# Patient Record
Sex: Male | Born: 1965
Health system: Southern US, Community
[De-identification: ages and names within clinical notes are randomized; demographics above are authoritative.]

## PROBLEM LIST (undated history)

## (undated) DIAGNOSIS — Z720 Tobacco use: Secondary | ICD-10-CM

## (undated) DIAGNOSIS — I639 Cerebral infarction, unspecified: Secondary | ICD-10-CM

## (undated) HISTORY — DX: Cerebral infarction, unspecified: I63.9

---

## 2019-06-02 ENCOUNTER — Inpatient Hospital Stay (HOSPITAL_COMMUNITY)
Admission: EM | Admit: 2019-06-02 | Discharge: 2019-06-12 | DRG: 064 | Disposition: A | Discharge status: Rehabilitation Facility | Payer: Medicaid Other | Attending: Family Medicine | Admitting: Family Medicine

## 2019-06-02 ENCOUNTER — Observation Stay (HOSPITAL_COMMUNITY): Payer: Medicaid Other

## 2019-06-02 ENCOUNTER — Other Ambulatory Visit: Payer: Self-pay

## 2019-06-02 ENCOUNTER — Emergency Department (HOSPITAL_COMMUNITY): Payer: Medicaid Other

## 2019-06-02 ENCOUNTER — Encounter (HOSPITAL_COMMUNITY): Payer: Self-pay

## 2019-06-02 ENCOUNTER — Inpatient Hospital Stay (HOSPITAL_COMMUNITY): Payer: Medicaid Other

## 2019-06-02 DIAGNOSIS — G936 Cerebral edema: Secondary | ICD-10-CM | POA: Diagnosis present

## 2019-06-02 DIAGNOSIS — Z885 Allergy status to narcotic agent status: Secondary | ICD-10-CM

## 2019-06-02 DIAGNOSIS — I651 Occlusion and stenosis of basilar artery: Secondary | ICD-10-CM | POA: Diagnosis present

## 2019-06-02 DIAGNOSIS — Z7952 Long term (current) use of systemic steroids: Secondary | ICD-10-CM | POA: Diagnosis not present

## 2019-06-02 DIAGNOSIS — R7303 Prediabetes: Secondary | ICD-10-CM

## 2019-06-02 DIAGNOSIS — D72829 Elevated white blood cell count, unspecified: Secondary | ICD-10-CM

## 2019-06-02 DIAGNOSIS — R29714 NIHSS score 14: Secondary | ICD-10-CM | POA: Diagnosis present

## 2019-06-02 DIAGNOSIS — I6322 Cerebral infarction due to unspecified occlusion or stenosis of basilar arteries: Principal | ICD-10-CM | POA: Diagnosis present

## 2019-06-02 DIAGNOSIS — Z886 Allergy status to analgesic agent status: Secondary | ICD-10-CM

## 2019-06-02 DIAGNOSIS — I951 Orthostatic hypotension: Secondary | ICD-10-CM | POA: Diagnosis present

## 2019-06-02 DIAGNOSIS — G8929 Other chronic pain: Secondary | ICD-10-CM | POA: Diagnosis present

## 2019-06-02 DIAGNOSIS — Z20822 Contact with and (suspected) exposure to covid-19: Secondary | ICD-10-CM | POA: Diagnosis present

## 2019-06-02 DIAGNOSIS — F17210 Nicotine dependence, cigarettes, uncomplicated: Secondary | ICD-10-CM | POA: Diagnosis present

## 2019-06-02 DIAGNOSIS — G629 Polyneuropathy, unspecified: Secondary | ICD-10-CM | POA: Diagnosis present

## 2019-06-02 DIAGNOSIS — I82290 Acute embolism and thrombosis of other thoracic veins: Secondary | ICD-10-CM | POA: Diagnosis present

## 2019-06-02 DIAGNOSIS — Z880 Allergy status to penicillin: Secondary | ICD-10-CM

## 2019-06-02 DIAGNOSIS — I69365 Other paralytic syndrome following cerebral infarction, bilateral: Secondary | ICD-10-CM | POA: Diagnosis not present

## 2019-06-02 DIAGNOSIS — R1110 Vomiting, unspecified: Secondary | ICD-10-CM

## 2019-06-02 DIAGNOSIS — R471 Dysarthria and anarthria: Secondary | ICD-10-CM | POA: Diagnosis present

## 2019-06-02 DIAGNOSIS — Z79899 Other long term (current) drug therapy: Secondary | ICD-10-CM | POA: Diagnosis not present

## 2019-06-02 DIAGNOSIS — M549 Dorsalgia, unspecified: Secondary | ICD-10-CM | POA: Diagnosis present

## 2019-06-02 DIAGNOSIS — I69354 Hemiplegia and hemiparesis following cerebral infarction affecting left non-dominant side: Secondary | ICD-10-CM | POA: Diagnosis not present

## 2019-06-02 DIAGNOSIS — R4701 Aphasia: Secondary | ICD-10-CM | POA: Diagnosis present

## 2019-06-02 DIAGNOSIS — I639 Cerebral infarction, unspecified: Secondary | ICD-10-CM | POA: Diagnosis present

## 2019-06-02 DIAGNOSIS — F419 Anxiety disorder, unspecified: Secondary | ICD-10-CM | POA: Diagnosis not present

## 2019-06-02 DIAGNOSIS — I6340 Cerebral infarction due to embolism of unspecified cerebral artery: Secondary | ICD-10-CM | POA: Diagnosis present

## 2019-06-02 DIAGNOSIS — G4733 Obstructive sleep apnea (adult) (pediatric): Secondary | ICD-10-CM | POA: Diagnosis present

## 2019-06-02 DIAGNOSIS — E876 Hypokalemia: Secondary | ICD-10-CM

## 2019-06-02 DIAGNOSIS — T17908A Unspecified foreign body in respiratory tract, part unspecified causing other injury, initial encounter: Secondary | ICD-10-CM

## 2019-06-02 DIAGNOSIS — Z720 Tobacco use: Secondary | ICD-10-CM

## 2019-06-02 DIAGNOSIS — E785 Hyperlipidemia, unspecified: Secondary | ICD-10-CM | POA: Diagnosis present

## 2019-06-02 DIAGNOSIS — Z8249 Family history of ischemic heart disease and other diseases of the circulatory system: Secondary | ICD-10-CM

## 2019-06-02 DIAGNOSIS — R29810 Facial weakness: Secondary | ICD-10-CM | POA: Diagnosis present

## 2019-06-02 HISTORY — DX: Tobacco use: Z72.0

## 2019-06-02 LAB — COMPREHENSIVE METABOLIC PANEL
ALT: 46 U/L — ABNORMAL HIGH (ref 0–44)
AST: 25 U/L (ref 15–41)
Albumin: 3.7 g/dL (ref 3.5–5.0)
Alkaline Phosphatase: 92 U/L (ref 38–126)
Anion gap: 14 (ref 5–15)
BUN: 24 mg/dL — ABNORMAL HIGH (ref 6–20)
CO2: 24 mmol/L (ref 22–32)
Calcium: 9.2 mg/dL (ref 8.9–10.3)
Chloride: 102 mmol/L (ref 98–111)
Creatinine, Ser: 1.03 mg/dL (ref 0.61–1.24)
GFR calc Af Amer: >60 mL/min (ref >60)
GFR calc non Af Amer: >60 mL/min (ref >60)
Glucose, Bld: 111 mg/dL — ABNORMAL HIGH (ref 70–99)
Potassium: 3.6 mmol/L (ref 3.5–5.1)
Sodium: 140 mmol/L (ref 135–145)
Total Bilirubin: 0.9 mg/dL (ref 0.3–1.2)
Total Protein: 7.1 g/dL (ref 6.5–8.1)

## 2019-06-02 LAB — URINALYSIS, ROUTINE W REFLEX MICROSCOPIC
Bilirubin Urine: NEGATIVE
Glucose, UA: NEGATIVE mg/dL
Hgb urine dipstick: NEGATIVE
Ketones, ur: NEGATIVE mg/dL
Leukocytes,Ua: NEGATIVE
Nitrite: NEGATIVE
Protein, ur: NEGATIVE mg/dL
Specific Gravity, Urine: 1.02 (ref 1.005–1.030)
pH: 6 (ref 5.0–8.0)

## 2019-06-02 LAB — DIFFERENTIAL
Abs Immature Granulocytes: 0 10*3/uL (ref 0.00–0.07)
Basophils Absolute: 0 10*3/uL (ref 0.0–0.1)
Basophils Relative: 0 %
Eosinophils Absolute: 0.2 10*3/uL (ref 0.0–0.5)
Eosinophils Relative: 1 %
Lymphocytes Relative: 28 %
Lymphs Abs: 6 10*3/uL — ABNORMAL HIGH (ref 0.7–4.0)
Monocytes Absolute: 1.3 10*3/uL — ABNORMAL HIGH (ref 0.1–1.0)
Monocytes Relative: 6 %
Neutro Abs: 13.8 10*3/uL — ABNORMAL HIGH (ref 1.7–7.7)
Neutrophils Relative %: 65 %
nRBC: 0 /100 WBC

## 2019-06-02 LAB — HEMOGLOBIN A1C
Hgb A1c MFr Bld: 5.8 % — ABNORMAL HIGH (ref 4.8–5.6)
Mean Plasma Glucose: 119.76 mg/dL

## 2019-06-02 LAB — RAPID URINE DRUG SCREEN, HOSP PERFORMED
Amphetamines: NOT DETECTED
Barbiturates: NOT DETECTED
Benzodiazepines: NOT DETECTED
Cocaine: NOT DETECTED
Opiates: NOT DETECTED
Tetrahydrocannabinol: NOT DETECTED

## 2019-06-02 LAB — CBC
HCT: 50.6 % (ref 39.0–52.0)
Hemoglobin: 17 g/dL (ref 13.0–17.0)
MCH: 30.2 pg (ref 26.0–34.0)
MCHC: 33.6 g/dL (ref 30.0–36.0)
MCV: 90 fL (ref 80.0–100.0)
Platelets: 293 10*3/uL (ref 150–400)
RBC: 5.62 MIL/uL (ref 4.22–5.81)
RDW: 14.6 % (ref 11.5–15.5)
WBC: 21.3 10*3/uL — ABNORMAL HIGH (ref 4.0–10.5)
nRBC: 0 % (ref 0.0–0.2)

## 2019-06-02 LAB — CBG MONITORING, ED
Glucose-Capillary: 105 mg/dL — ABNORMAL HIGH (ref 70–99)
Glucose-Capillary: 108 mg/dL — ABNORMAL HIGH (ref 70–99)

## 2019-06-02 LAB — HIV ANTIBODY (ROUTINE TESTING W REFLEX): HIV Screen 4th Generation wRfx: NONREACTIVE

## 2019-06-02 LAB — LIPID PANEL
Cholesterol: 221 mg/dL — ABNORMAL HIGH (ref 0–200)
HDL: 40 mg/dL — ABNORMAL LOW (ref >40)
LDL Cholesterol: UNDETERMINED mg/dL (ref 0–99)
Total CHOL/HDL Ratio: 5.5 RATIO
Triglycerides: 414 mg/dL — ABNORMAL HIGH (ref <150)
VLDL: UNDETERMINED mg/dL (ref 0–40)

## 2019-06-02 LAB — PROTIME-INR
INR: 0.9 (ref 0.8–1.2)
Prothrombin Time: 12.1 seconds (ref 11.4–15.2)

## 2019-06-02 LAB — CK: Total CK: 118 U/L (ref 49–397)

## 2019-06-02 LAB — ETHANOL: Alcohol, Ethyl (B): <10 mg/dL (ref <10)

## 2019-06-02 LAB — SARS CORONAVIRUS 2 (TAT 6-24 HRS): SARS Coronavirus 2: NEGATIVE

## 2019-06-02 LAB — PROCALCITONIN: Procalcitonin: <0.1 ng/mL

## 2019-06-02 LAB — TSH: TSH: 2.069 u[IU]/mL (ref 0.350–4.500)

## 2019-06-02 LAB — CORTISOL: Cortisol, Plasma: 10.6 ug/dL

## 2019-06-02 LAB — LDL CHOLESTEROL, DIRECT: Direct LDL: 121.3 mg/dL — ABNORMAL HIGH (ref 0–99)

## 2019-06-02 IMAGING — MR MR HEAD W/O CM
8 of 9 series · 37 of 48 positions shown · non-contrast
Comparison: Noncontrast head CT [DATE]

CLINICAL DATA: Speech difficulty.

EXAM:
MRI HEAD WITHOUT CONTRAST
TECHNIQUE: Multiplanar, multiecho pulse sequences of the brain and surrounding
structures were obtained without intravenous contrast.

[Series 3: DWI · axial · 3.0mm · 1.09mm/px · z∈[-80,+81]mm · 9 of 110 slices shown (1 of 4)]
[im 1/110]
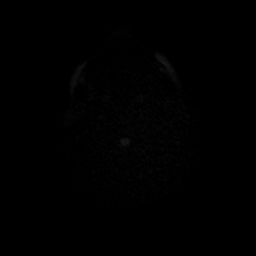
[im 20/110]
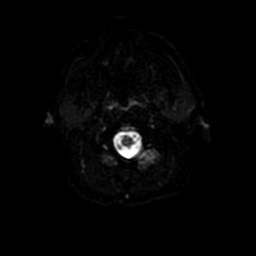
[im 30/110]
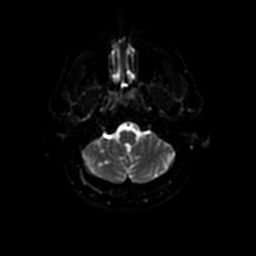
[im 50/110]
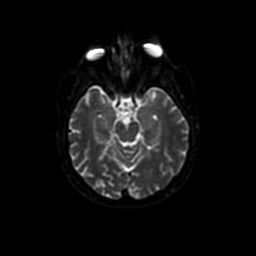
[im 60/110]
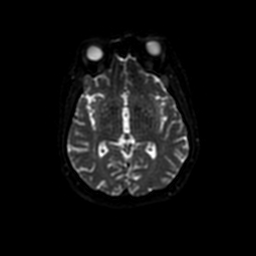
[im 80/110]
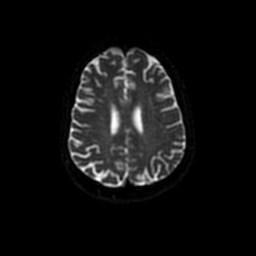
[im 90/110]
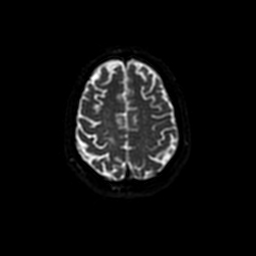
[im 100/110]
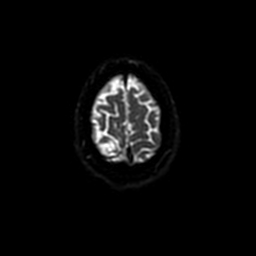
[im 110/110]
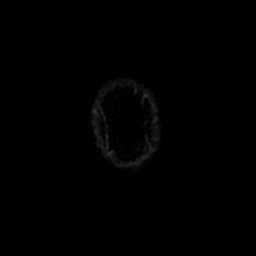

[Series 4: DWI · coronal · 5.0mm · 1.09mm/px · 8 of 72 slices shown (2 of 4)]
[im 1/72]
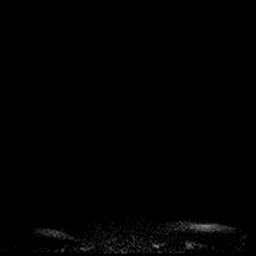
[im 11/72]
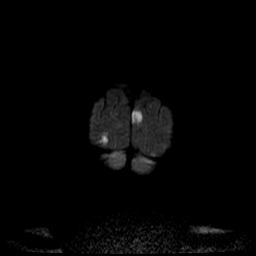
[im 21/72]
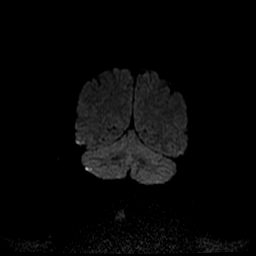
[im 31/72]
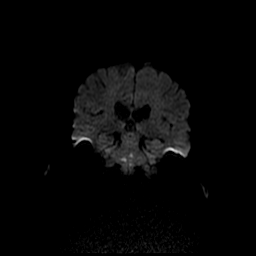
[im 41/72]
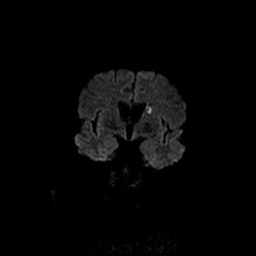
[im 51/72]
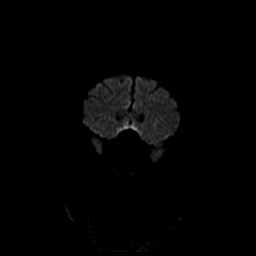
[im 61/72]
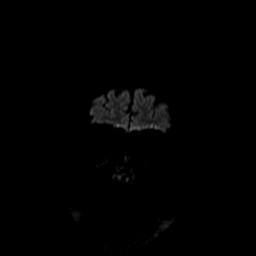
[im 72/72]
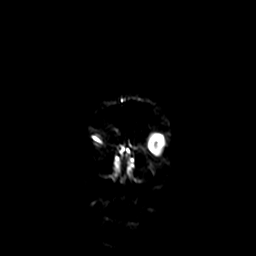

[Series 5: T1 · sagittal · 5.0mm · 0.47mm/px · 2 of 24 slices shown (1 of 2)]
[im 1/24]
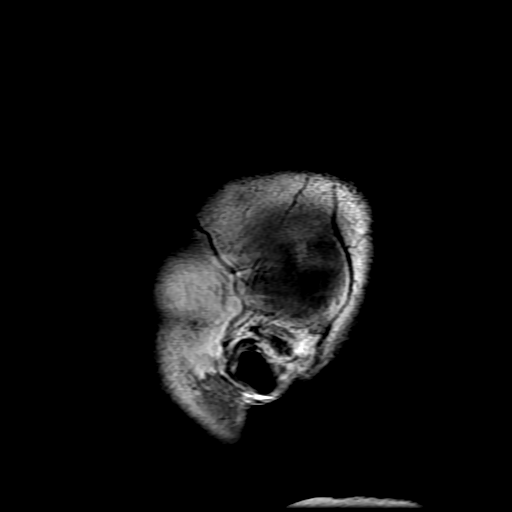
[im 24/24]
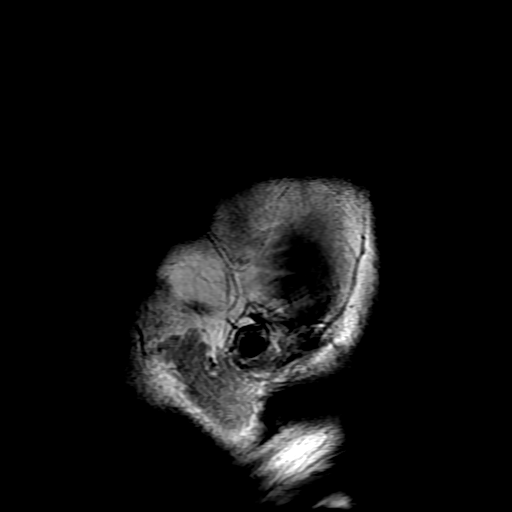

[Series 6: T2 · axial · 5.0mm · 0.43mm/px · z∈[-34,+97]mm · 2 of 24 slices shown]
[im 1/24]
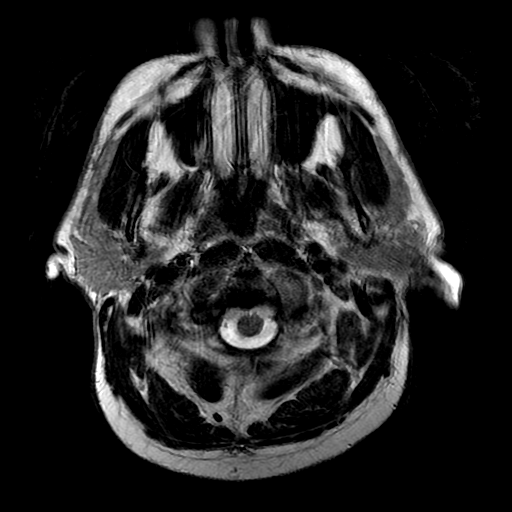
[im 24/24]
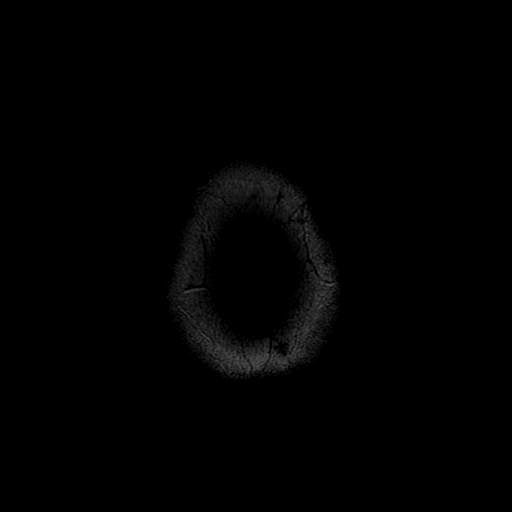

[Series 7: FLAIR · axial · 3.0mm · 0.43mm/px · z∈[-49,+86]mm · 2 of 24 slices shown]
[im 1/24]
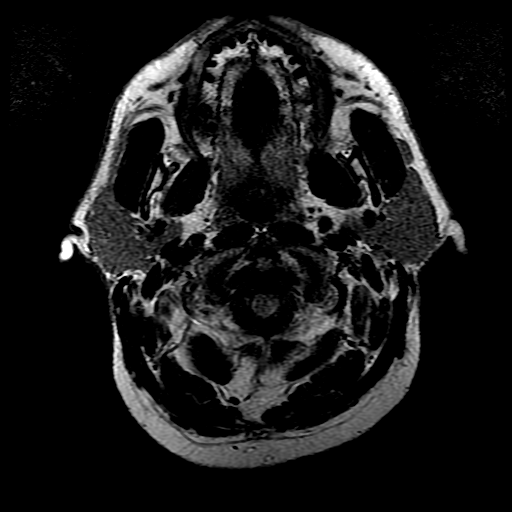
[im 24/24]
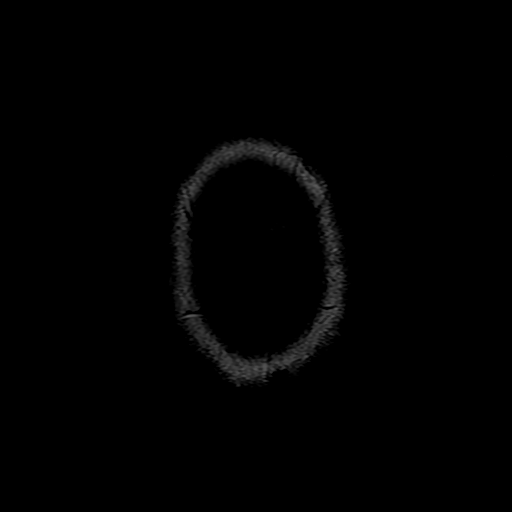

[Series 9: T1 · axial · 3.0mm · 0.47mm/px · z∈[-57,+7]mm · 4 of 100 slices shown (2 of 2)]
[im 1/100]
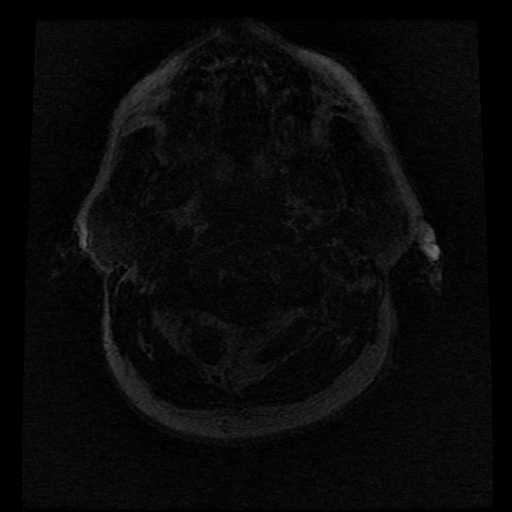
[im 12/100]
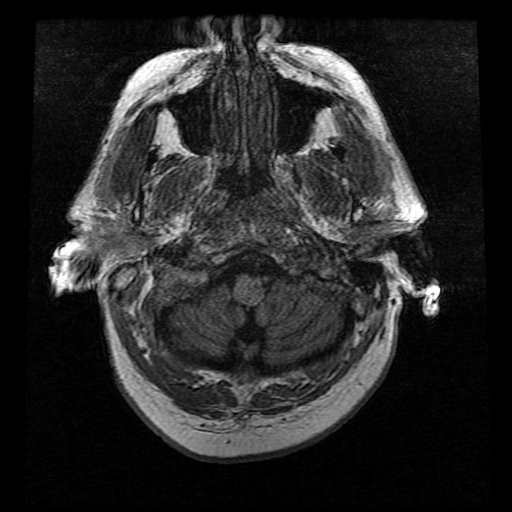
[im 34/100]
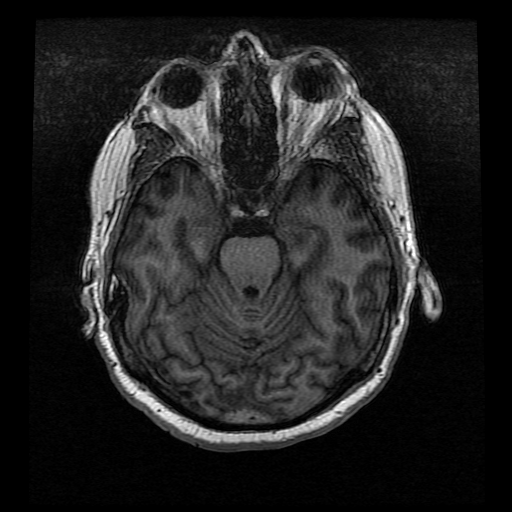
[im 45/100]
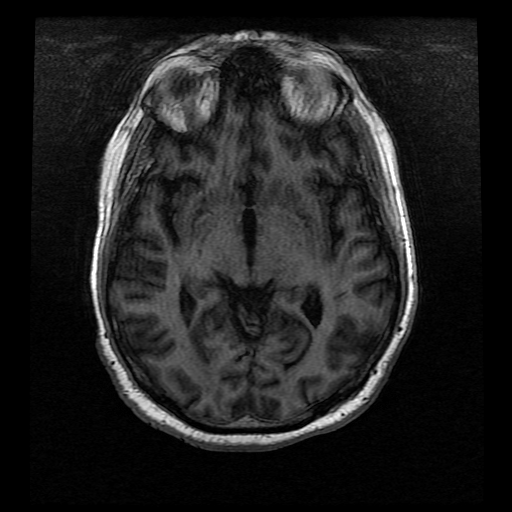

[Series 300: DWI · axial · 3.0mm · 1.09mm/px · z∈[-80,+81]mm · 6 of 55 slices shown (3 of 4)]
[im 1/55]
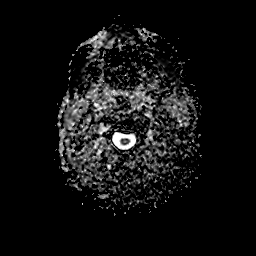
[im 11/55]
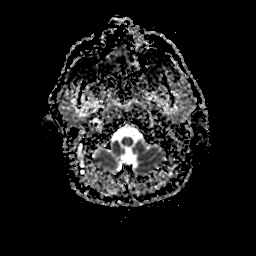
[im 22/55]
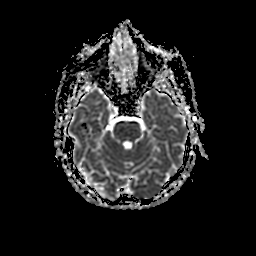
[im 33/55]
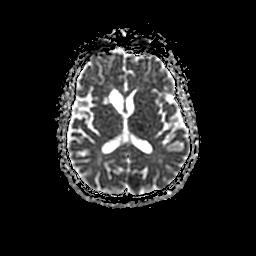
[im 44/55]
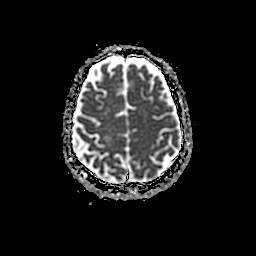
[im 55/55]
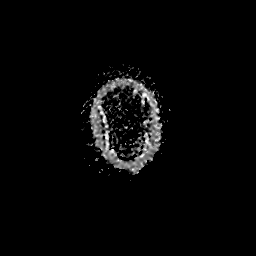

[Series 400: DWI · coronal · 5.0mm · 1.09mm/px · 4 of 36 slices shown (4 of 4)]
[im 1/36]
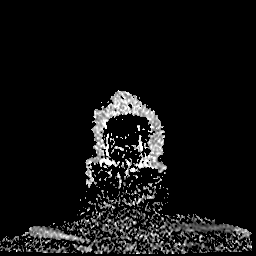
[im 12/36]
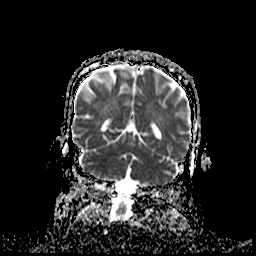
[im 24/36]
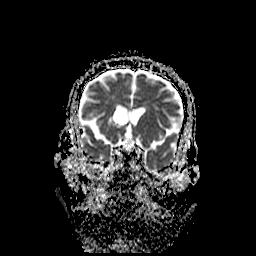
[im 36/36]
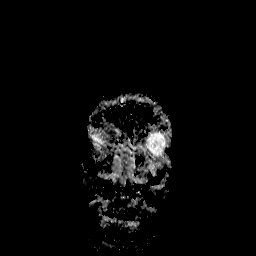

[37 of 48 positions shown; findings below may reference images not displayed]

FINDINGS: Brain:

Please note the patient was unable to tolerate the full examination.
As a result, postcontrast imaging could not be obtained. The
acquired sequences are intermittently motion degraded.

There are multiple small scattered foci of cortical and subcortical
restricted diffusion within the bilateral cerebral hemispheres
consistent with acute infarcts. These are most notably within the
bilateral parietooccipital lobes, but also affect the bilateral
frontal lobes. Additional small acute infarct within the left corona
radiata. Multiple acute lacunar infarcts within the left thalamus.
Multiple acute infarcts within the pons. Multiple acute infarcts
within the right cerebellum. Corresponding T2/FLAIR hyperintensity
at these sites. No significant mass effect.

Chronic infarct within the right frontal lobe motor strip and high
right parietal lobe. Background mild scattered T2/FLAIR
hyperintensity within the cerebral white matter is nonspecific, but
consistent with chronic small vessel ischemic disease. Mild
generalized parenchymal atrophy. There is no evidence of
intracranial mass. No midline shift or extra-axial fluid collection.
No chronic intracranial blood products.

Vascular: No definite loss of expected proximal arterial flow voids.

Skull and upper cervical spine: No focal marrow lesion.

Sinuses/Orbits: Visualized orbits demonstrate no acute abnormality.
Mild ethmoid and right maxillary sinus mucosal thickening. No
significant mastoid effusion.
IMPRESSION: The patient was unable to tolerate the full examination. As a
result, postcontrast imaging could not be obtained.

Numerous small supratentorial and infratentorial acute infarcts
involving the cerebral hemispheres, left thalamus, pons and right
cerebellar hemisphere. Given involvement of multiple vascular
territories, findings are suggestive of an embolic process.

Chronic right frontoparietal cortical infarcts.

Background mild generalized parenchymal atrophy and chronic small
vessel ischemic disease.

## 2019-06-02 IMAGING — CT CT ANGIO NECK
2 of 7 series · 8 of 33 positions shown · IV contrast (OMNI 350)
Comparison: Brain MRI [DATE].

CLINICAL DATA: Stroke suspected.

EXAM:
CT ANGIOGRAPHY HEAD AND NECK
TECHNIQUE: Multidetector CT imaging of the head and neck was performed using
the standard protocol during bolus administration of intravenous
contrast. Multiplanar CT image reconstructions and MIPs were
obtained to evaluate the vascular anatomy. Carotid stenosis
measurements (when applicable) are obtained utilizing NASCET
criteria, using the distal internal carotid diameter as the
denominator.
CONTRAST:  100mL OMNIPAQUE IOHEXOL 350 MG/ML SOLN

[Series 5: cta neck · axial · 0.44mm/px · z∈[-185,-69]mm · 2 of 174 slices shown]
[im 58/174  soft-tissue]
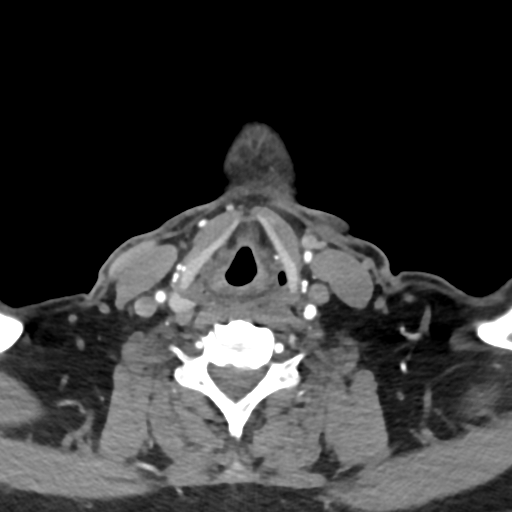
[im 116/174  soft-tissue]
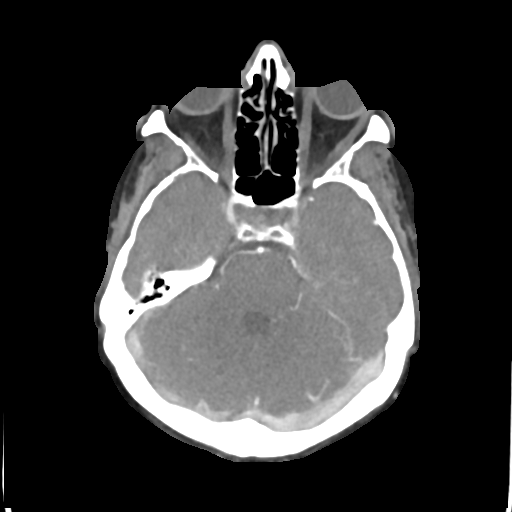

[Series 7: cta neck axial · axial · 0.39mm/px · z∈[-249,-3]mm · 6 of 346 slices shown]
[im 50/346  soft-tissue]
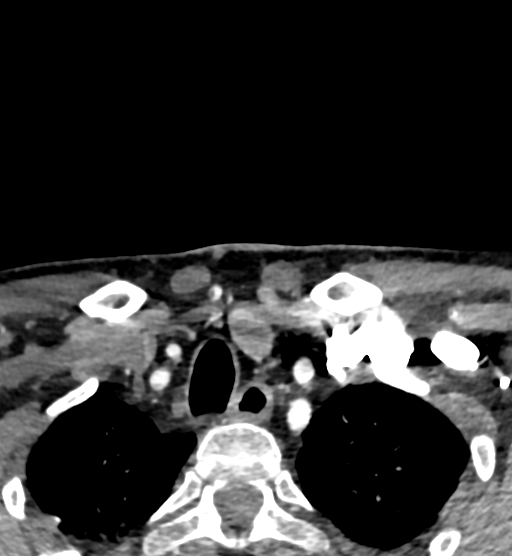
[im 99/346  bone]
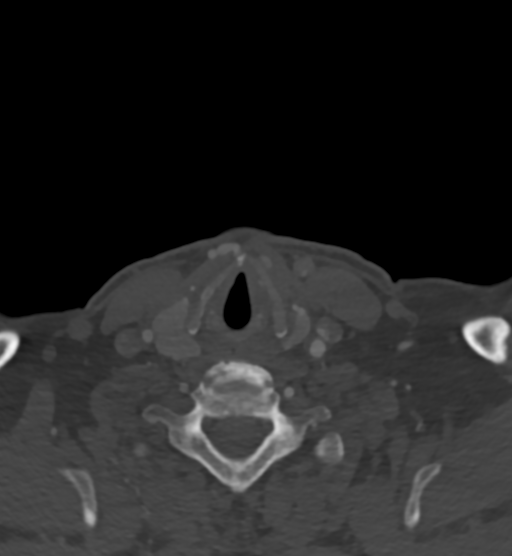
[im 148/346  soft-tissue]
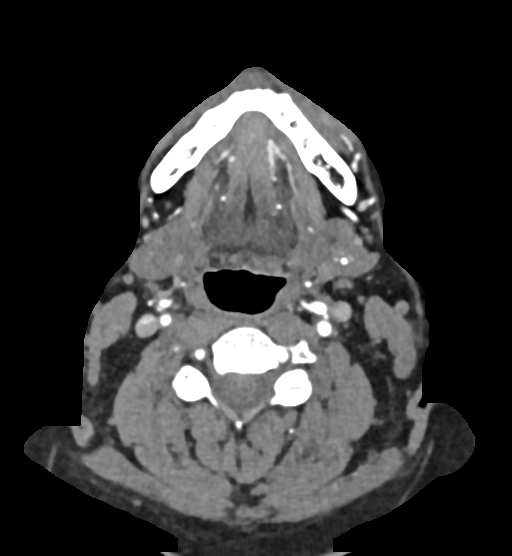
[im 198/346  bone]
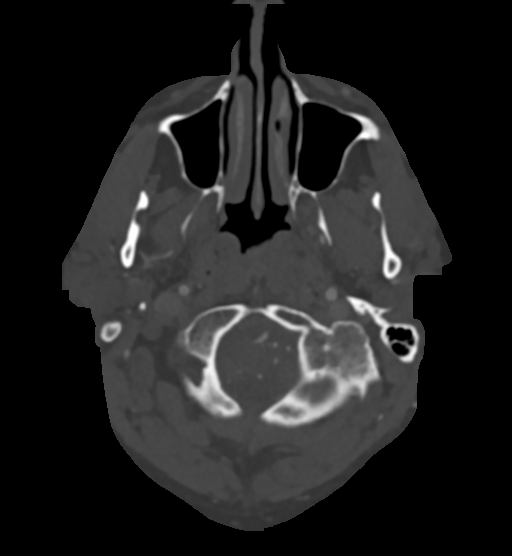
[im 247/346  soft-tissue]
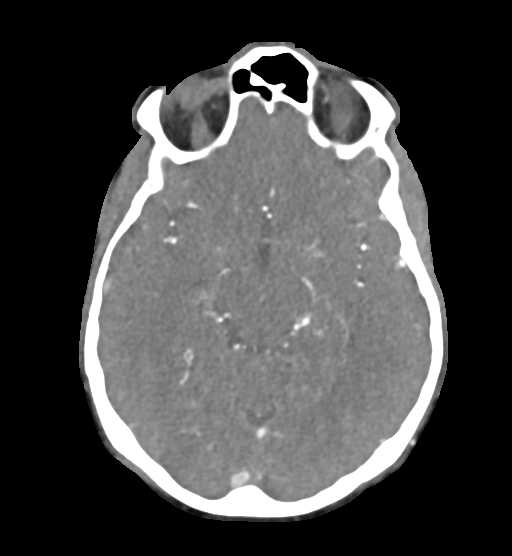
[im 296/346  bone]
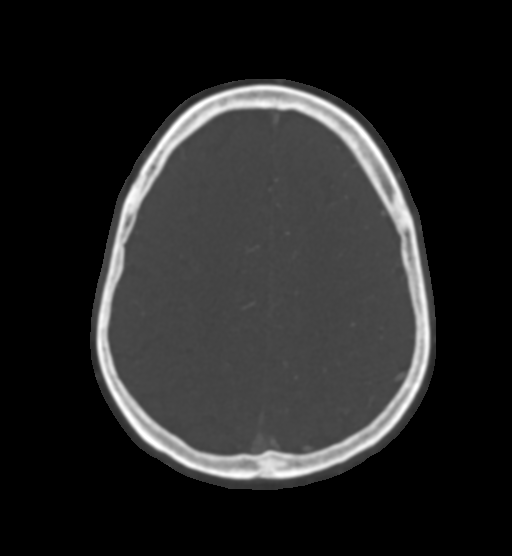

[8 of 33 positions shown; findings below may reference images not displayed]

FINDINGS: CTA NECK FINDINGS

Aortic arch: Standard aortic branching. There is an eccentric
curvilinear filling defect within the lumen of aortic arch measuring
2.0 x 4.7 cm (series 7, image 344). Findings likely reflect
eccentric thrombus. Additionally, there is occlusive thrombus within
the proximal to mid innominate artery. There is reconstitution of
flow at the level of the distal innominate artery. No significant
proximal subclavian stenosis.

Right carotid system: CCA and ICA patent within the neck without
significant stenosis (50% or greater).

Left carotid system: CCA and ICA patent within the neck without
significant stenosis (50% or greater). Eccentric hypodensity within
the mid left common carotid artery has an appearance most suggestive
of soft plaque. Eccentric thrombus cannot be definitively excluded.

Vertebral arteries: The vertebral artery is slightly dominant. The
vertebral arteries are patent within the neck. Soft plaque at the
vertebral artery origins bilaterally with mild ostial stenosis on
the left

Skeleton: No acute bony abnormality or aggressive osseous lesion.

Other neck: No neck mass or cervical lymphadenopathy. Chronic
fracture deformity of the T1 spinous process

Upper chest: No consolidation within the imaged lung apices.

Review of the MIP images confirms the above findings

CTA HEAD FINDINGS

Anterior circulation:

The intracranial internal carotid arteries are patent without
significant stenosis.

The M1 middle cerebral arteries are patent without significant
stenosis. No M2 proximal branch occlusion or high-grade proximal
stenosis is identified.

The anterior cerebral arteries are patent without significant
proximal stenosis.

No intracranial aneurysm is identified.

Posterior circulation:

There is occlusive thrombus within the distal V4 right vertebral
artery. Occlusive thrombus is also suspected within the distal V4
left vertebral artery. Occlusive thrombus extends into the proximal
and mid basilar artery. There is reconstitution of flow within the
distal basilar artery. Intermittent and irregular opacification of
the right PICA is seen and this vessel likely a contains multifocal
thrombus. Flow is seen within the proximal left PICA. The AICA
vessels are not well delineated. Flow is seen within the superior
cerebellar arteries bilaterally. The posterior cerebral arteries are
patent proximally without significant stenosis. However, emboli are
suspected within the distal posterior cerebral arteries bilaterally
given the pattern of acute infarcts demonstrated on brain MRI
performed earlier the same day. Posterior communicating arteries are
not definitively identified.

Venous sinuses: Within limitations of contrast timing, no convincing
thrombus.

Anatomic variants: As described.

Review of the MIP images confirms the above findings

These results were called by telephone at the time of interpretation
on [DATE] at [DATE] to provider SCHRAM , who verbally
acknowledged these results.
IMPRESSION: CTA neck:

1. Eccentric endoluminal thrombus within the aortic arch as
described.
2. Occlusive thrombus within the proximal to mid innominate artery.
3. The bilateral common and internal carotid arteries are patent
within the neck without significant stenosis. Eccentric hypodensity
within the mid left common carotid artery has an appearance most
suggestive of soft plaque. Eccentric thrombus at this site cannot be
definitively excluded.
4. The vertebral arteries are patent within the neck bilaterally.
Soft plaque at both vertebral artery origins with mild ostial
stenosis on the left.

CTA head:

1. Occlusive thrombus within the distal V4 right vertebral artery
with occlusive thrombus extending into the proximal and mid basilar
artery. Occlusive thrombus is also suspected within the very distal
V4 left vertebral artery. There is reconstitution of flow within the
distal basilar artery.
2. Only intermittent and irregular enhancement of the right PICA is
seen and there is suspected multifocal thrombus within this vessel.
3. The bilateral AICAs are poorly delineated.
4. The posterior cerebral arteries are patent proximally without
significant stenosis. However, emboli are suspected within the
distal PCAs bilaterally given the pattern of acute infarcts
demonstrated on same-day brain MRI.
5. No anterior circulation large vessel occlusion or proximal
high-grade stenosis is identified.

## 2019-06-02 IMAGING — DX DG CHEST 1V PORT
1 series · 1 of 1 positions shown · non-contrast
Comparison: [DATE]

CLINICAL DATA: Recent stroke-like symptoms

EXAM:
PORTABLE CHEST 1 VIEW

[chest ap]
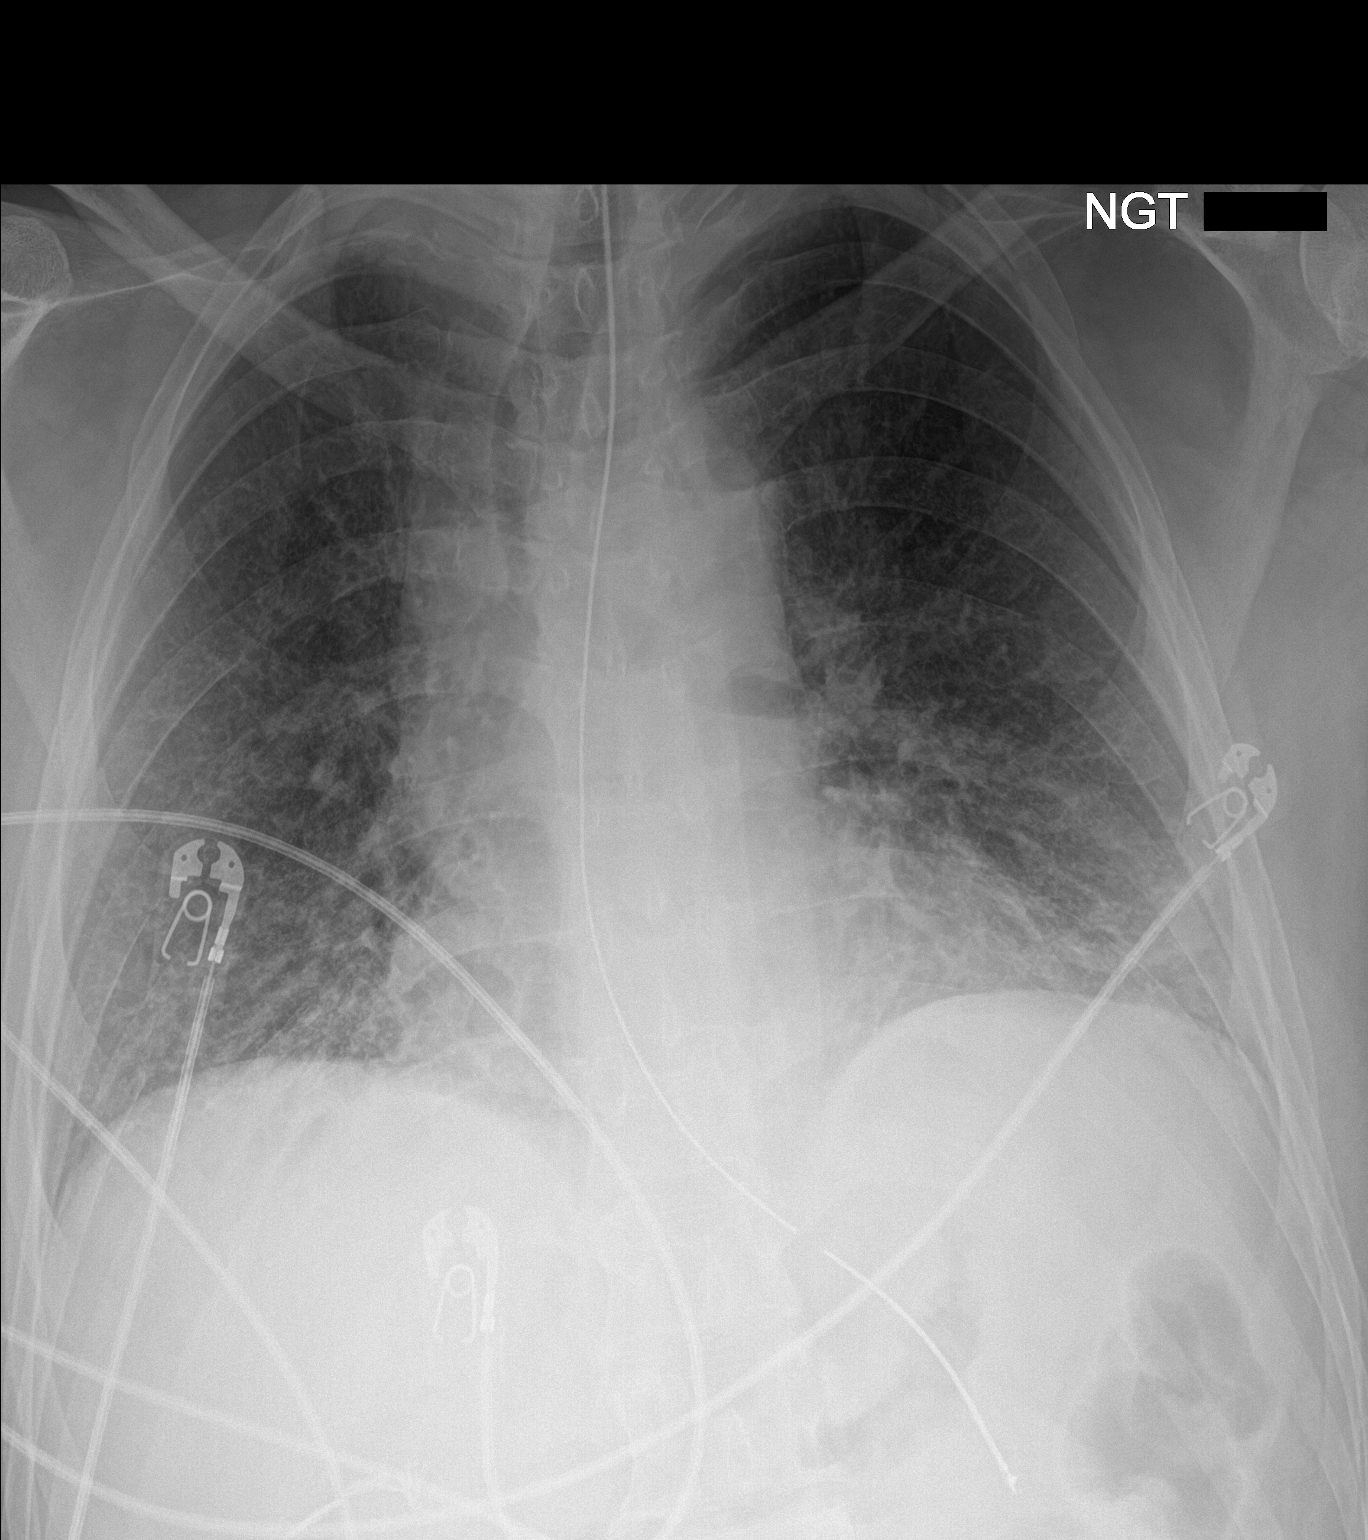

[1 of 1 positions shown; findings below may reference images not displayed]

FINDINGS: Cardiac shadow is mildly enlarged but accentuated by the portable
technique. Gastric catheter is noted within the stomach but could be
advanced several cm deeper to allow for the proximal side port to
lie within the gastric lumen. Lungs are well aerated bilaterally.
Mild bibasilar atelectasis is seen. No acute bony abnormality is
noted.
IMPRESSION: Gastric catheter as described.

Mild bibasilar atelectasis.

## 2019-06-02 IMAGING — MR MR CERVICAL SPINE W/O CM
5 series · 33 of 48 positions shown · non-contrast
Comparison: None.

CLINICAL DATA: Spinal stenosis

EXAM:
MRI CERVICAL SPINE WITHOUT CONTRAST
TECHNIQUE: Multiplanar, multisequence MR imaging of the cervical spine was
performed. No intravenous contrast was administered.

[Series 2: T1 · sagittal · 3.0mm · 0.41mm/px · 6 of 14 slices shown]
[im 1/14]
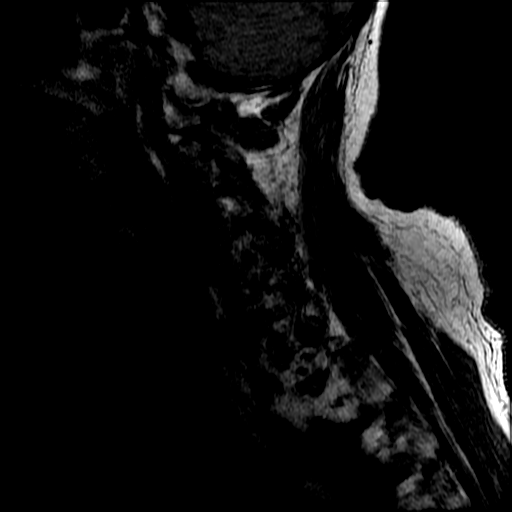
[im 3/14]
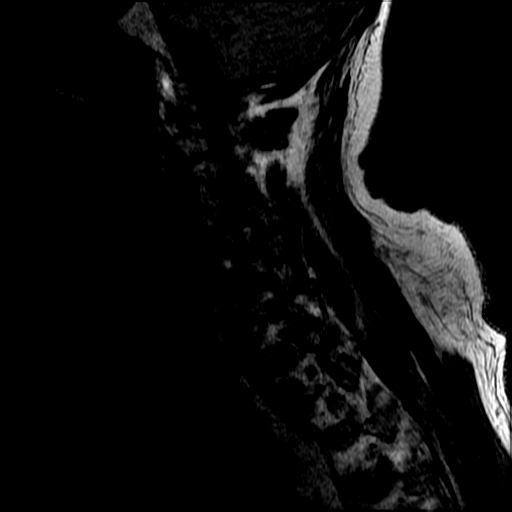
[im 6/14]
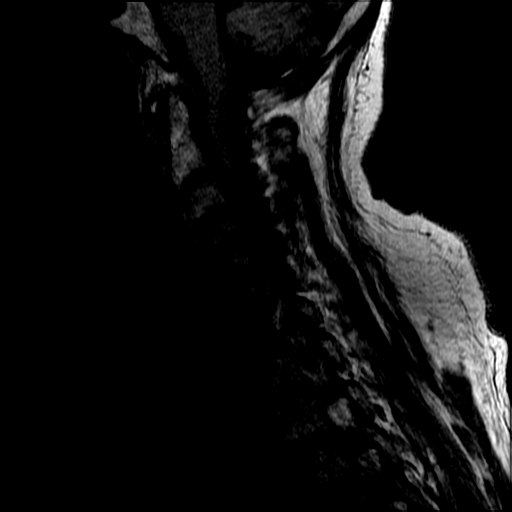
[im 8/14]
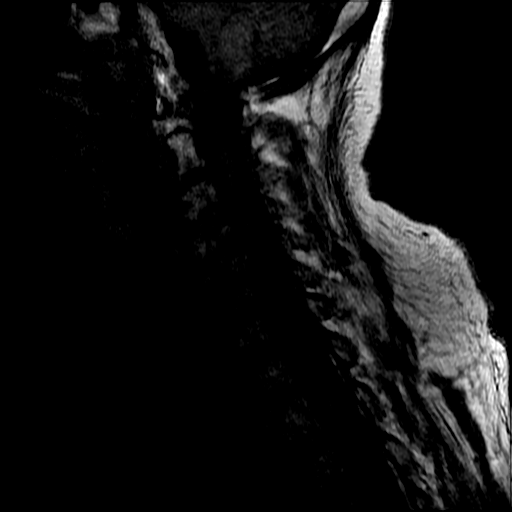
[im 11/14]
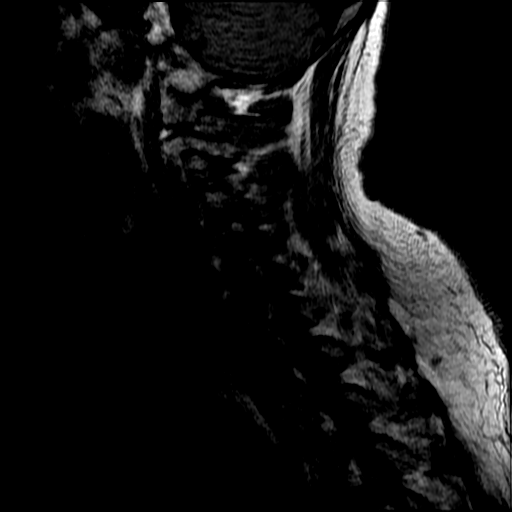
[im 14/14]
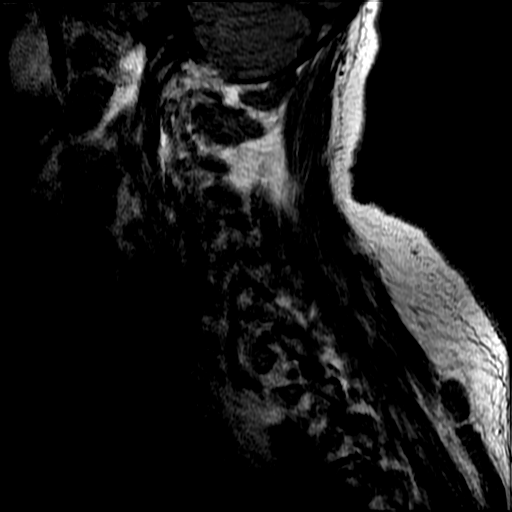

[Series 3: sag ir · sagittal · 3.0mm · 0.82mm/px · 7 of 14 slices shown]
[im 1/14]
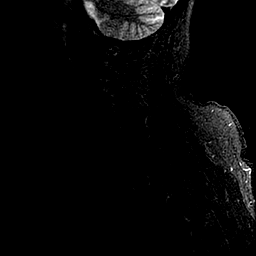
[im 3/14]
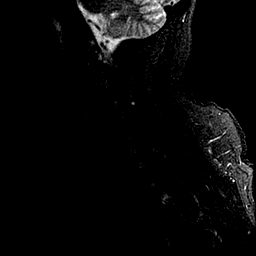
[im 5/14]
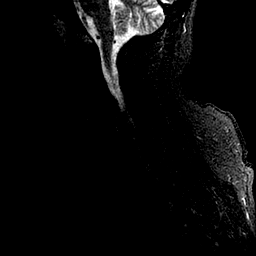
[im 7/14]
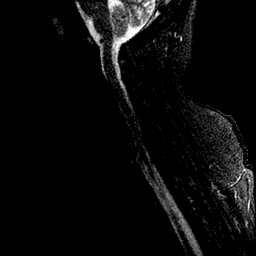
[im 9/14]
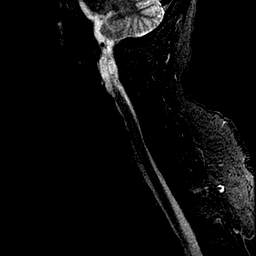
[im 11/14]
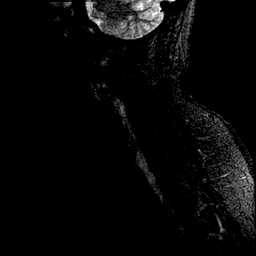
[im 14/14]
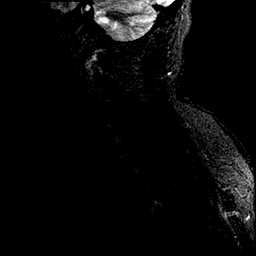

[Series 4: T2 · sagittal · 3.0mm · 0.41mm/px · 7 of 14 slices shown (1 of 2)]
[im 1/14]
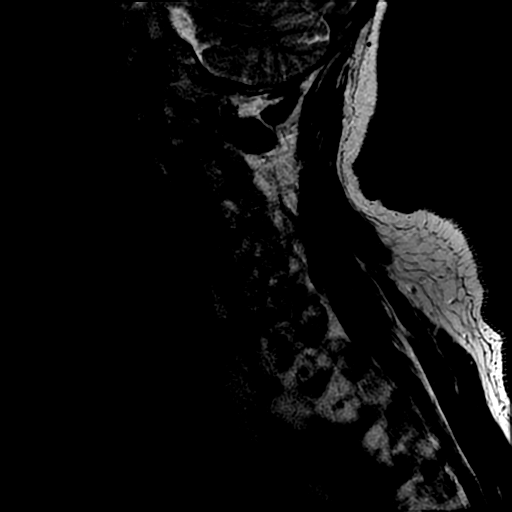
[im 3/14]
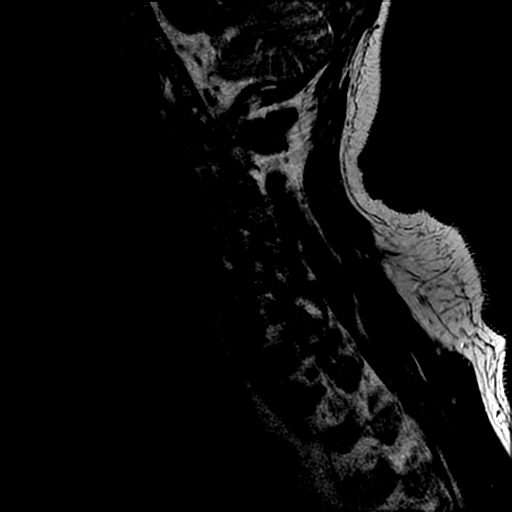
[im 5/14]
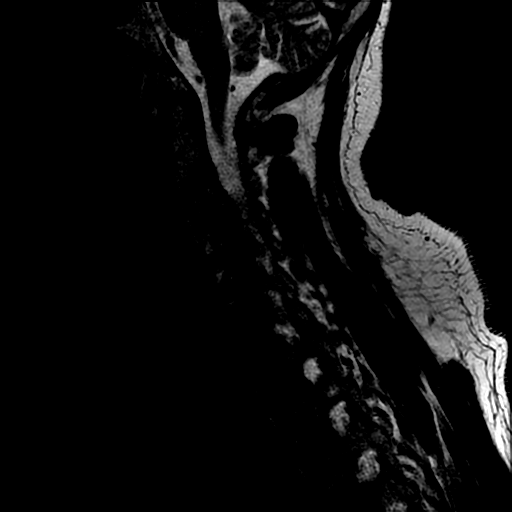
[im 7/14]
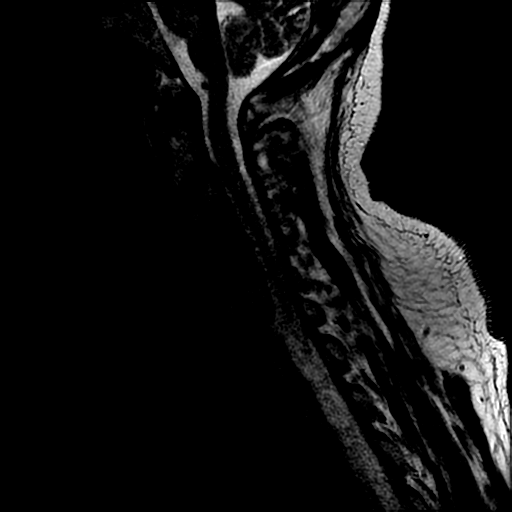
[im 9/14]
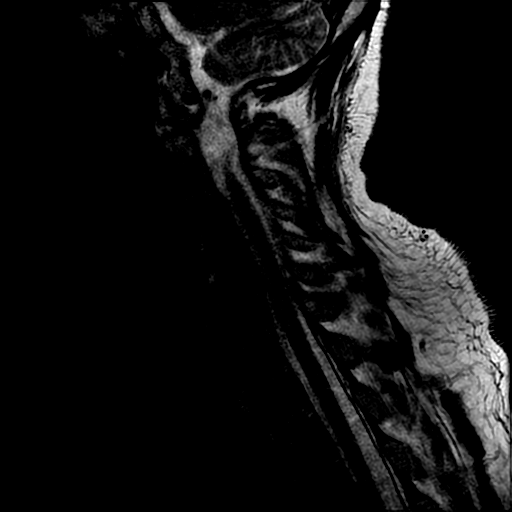
[im 11/14]
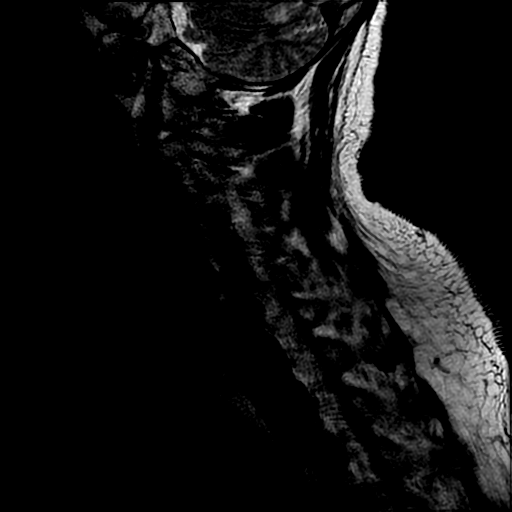
[im 14/14]
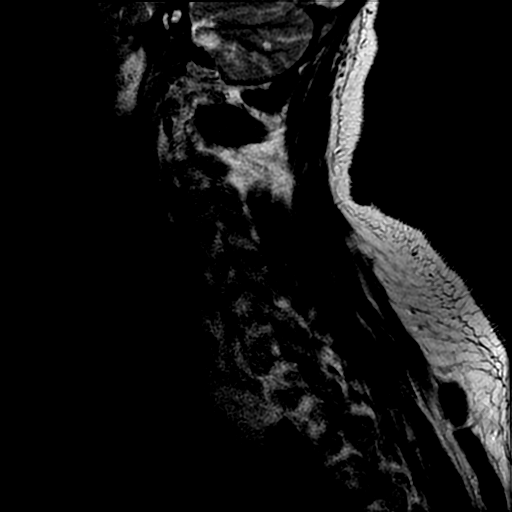

[Series 5: ax 2d merge · axial · 3.0mm · 0.78mm/px · z∈[-35,+11]mm · 5 of 27 slices shown]
[im 1/27]
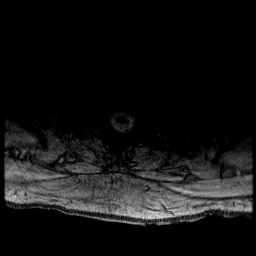
[im 5/27]
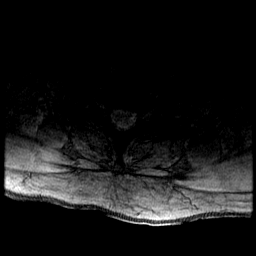
[im 9/27]
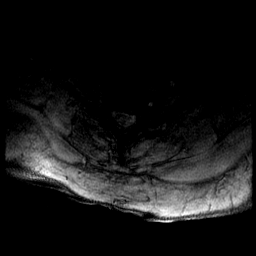
[im 13/27]
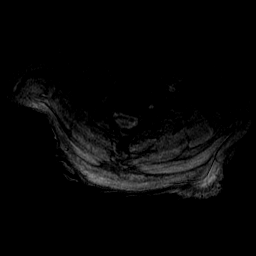
[im 15/27]
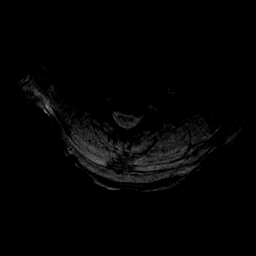

[Series 6: T2 · axial · 3.0mm · 0.78mm/px · z∈[-35,+50]mm · 8 of 27 slices shown (2 of 2)]
[im 1/27]
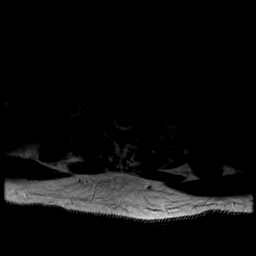
[im 5/27]
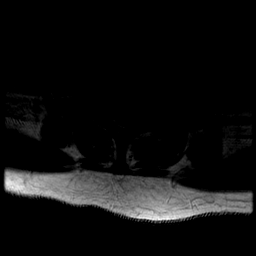
[im 9/27]
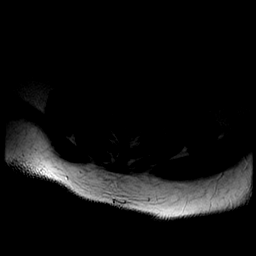
[im 13/27]
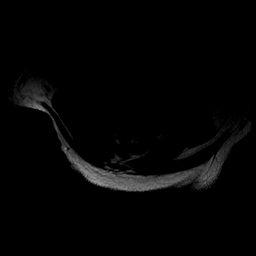
[im 15/27]
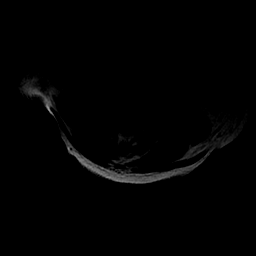
[im 19/27]
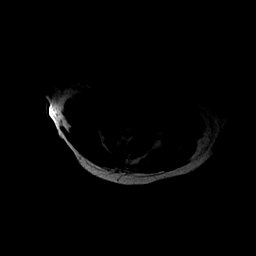
[im 23/27]
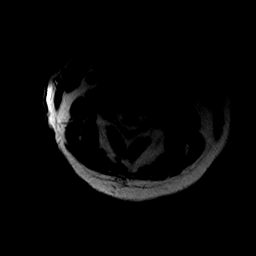
[im 27/27]
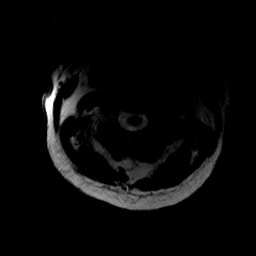

[33 of 48 positions shown; findings below may reference images not displayed]

FINDINGS: Significant motion artifact is present

Alignment: Trace degenerative listhesis.

Vertebrae: Vertebral body heights appear preserved apart from mild
degenerative endplate irregularity. No definite marrow edema.

Cord: No definite abnormal signal.

Posterior Fossa, vertebral arteries, paraspinal tissues: Small right
cerebellar infarct. Refer to MRI brain earlier same day

Disc levels:

C2-C3: Small central disc protrusion indents the ventral thecal sac.
Facet hypertrophy no significant canal or foraminal stenosis

C3-C4: Facet hypertrophy. No significant canal or foraminal
stenosis.

C4-C5: Probable left paracentral disc osteophyte complex. No canal
stenosis. Probable left foraminal stenosis.

C5-C6: Disc bulge with endplate osteophytic ridging eccentric to the
left. Probable mild canal stenosis. Probable bilateral foraminal
stenosis.

C6-C7: Disc bulge with endplate osteophytic ridging eccentric to the
left. No significant canal or foraminal stenosis.

C7-T1:  No significant canal or foraminal stenosis.
IMPRESSION: Motion degraded study. There is no high-grade canal stenosis.
Probable foraminal stenosis, which is difficult to evaluate in the
setting of artifact.

## 2019-06-02 IMAGING — CT CT HEAD W/O CM
4 series · 16 of 47 positions shown, 18 images · non-contrast
Comparison: None.

CLINICAL DATA: Right arm weakness, slurred speech, unequal pupils

EXAM:
CT HEAD WITHOUT CONTRAST
TECHNIQUE: Contiguous axial images were obtained from the base of the skull
through the vertex without intravenous contrast.

[Series 3: head without · axial · non-contrast · 0.45mm/px · z∈[-101,+19]mm · 7 of 34 slices shown, 9 images]
[im 5/34  brain]
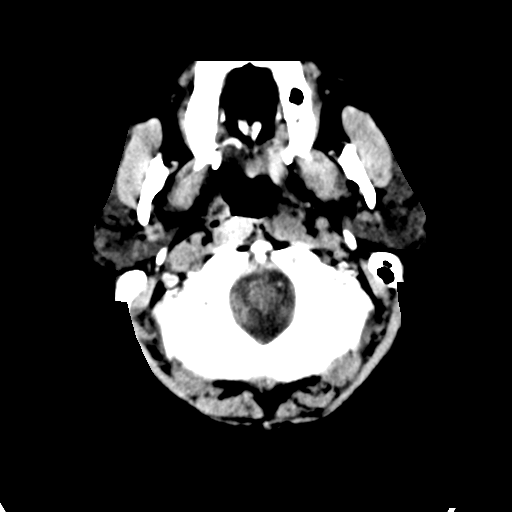
[im 5/34  bone]
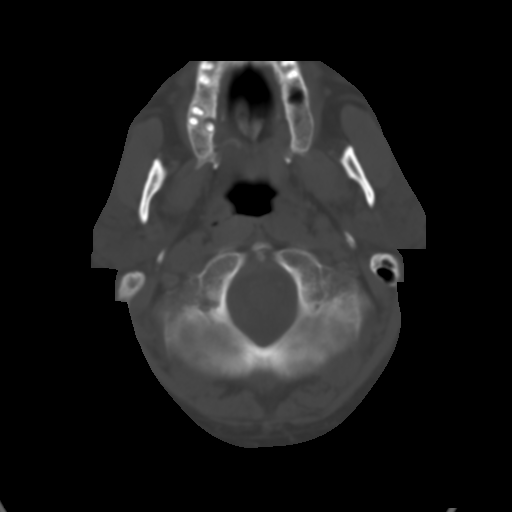
[im 9/34  brain]
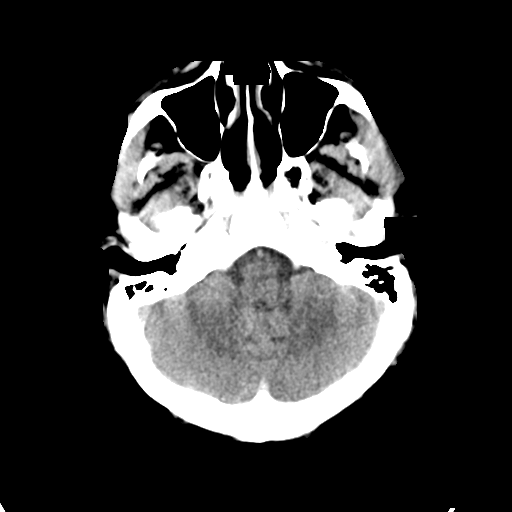
[im 13/34  brain]
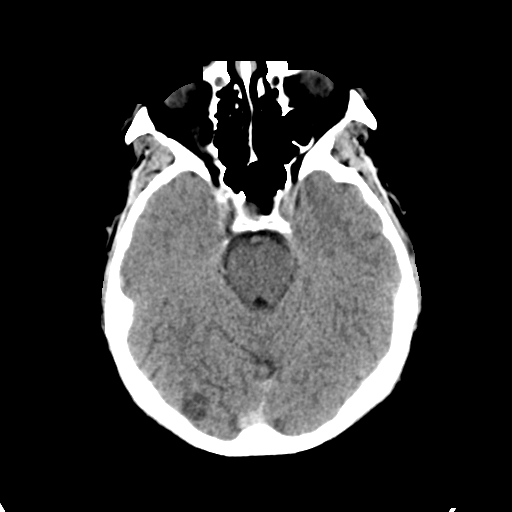
[im 17/34  brain]
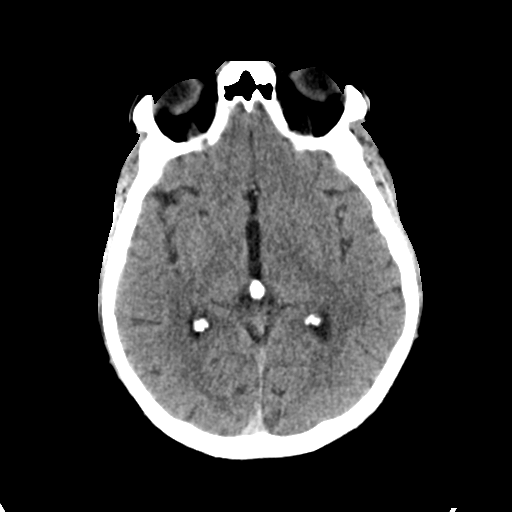
[im 21/34  brain]
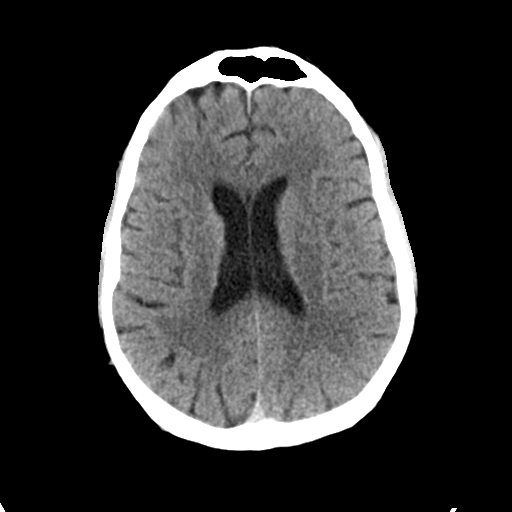
[im 21/34  bone]
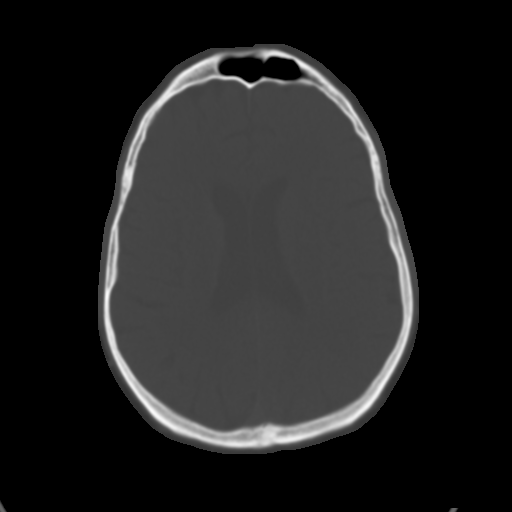
[im 25/34  brain]
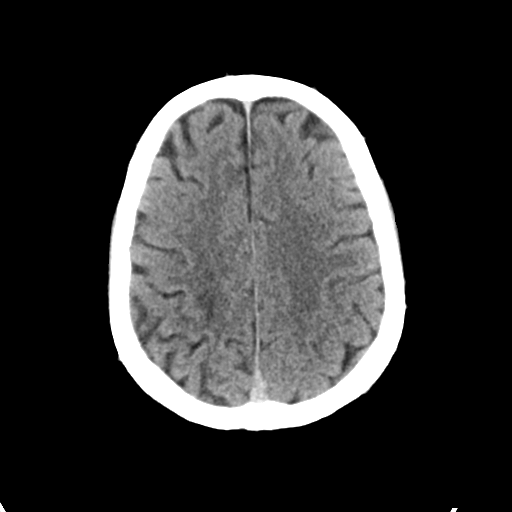
[im 29/34  brain]
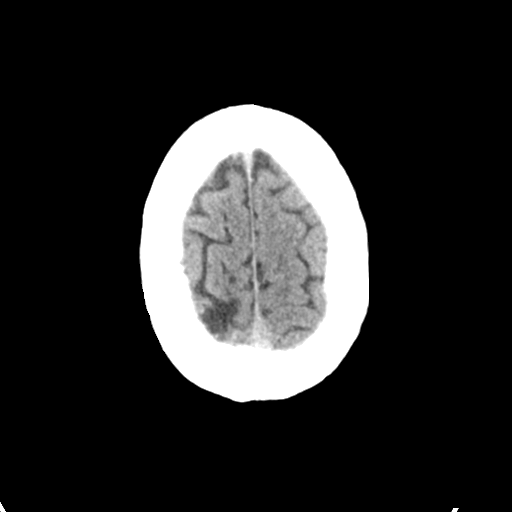

[Series 4: head bone · axial · 0.45mm/px · z∈[-105,-73]mm · 3 of 84 slices shown]
[im 9/84  bone]
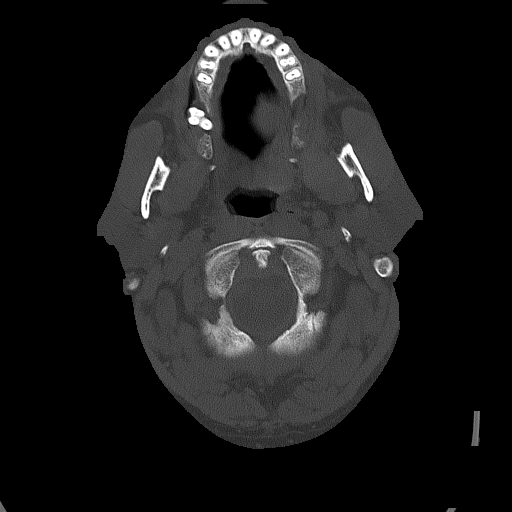
[im 17/84  bone]
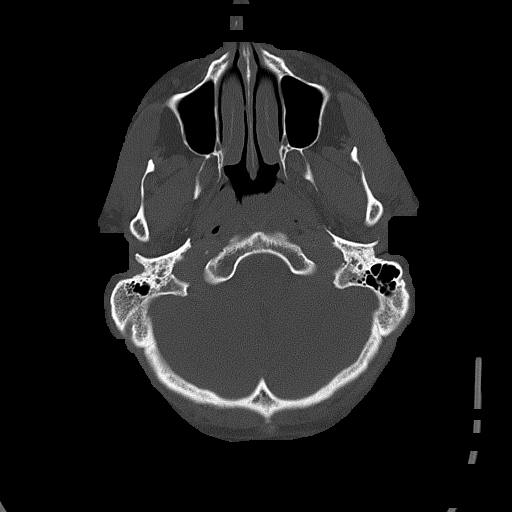
[im 25/84  bone]
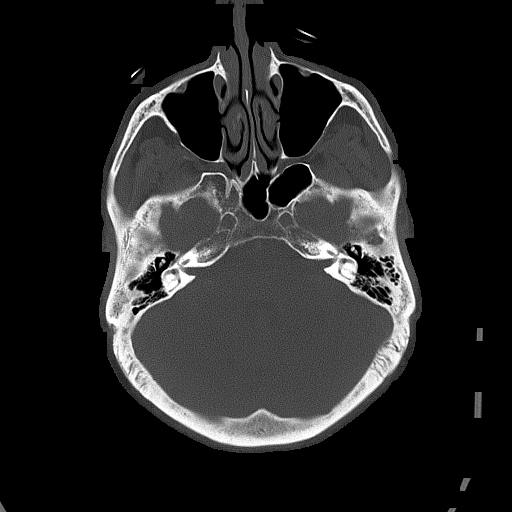

[Series 5: head without cor · coronal · non-contrast · 0.32mm/px · 3 of 67 slices shown]
[im 26/67  brain]
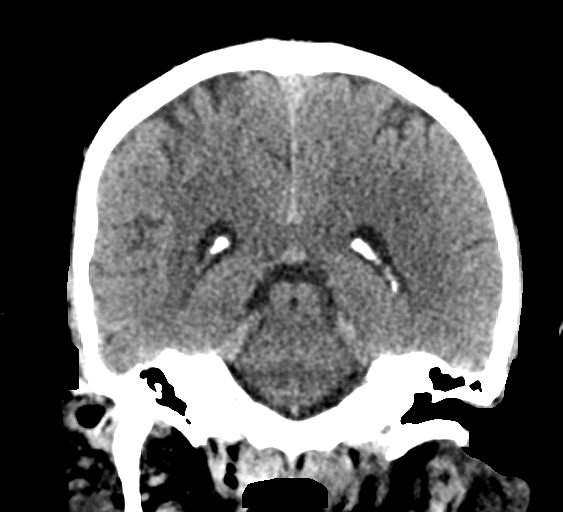
[im 31/67  brain]
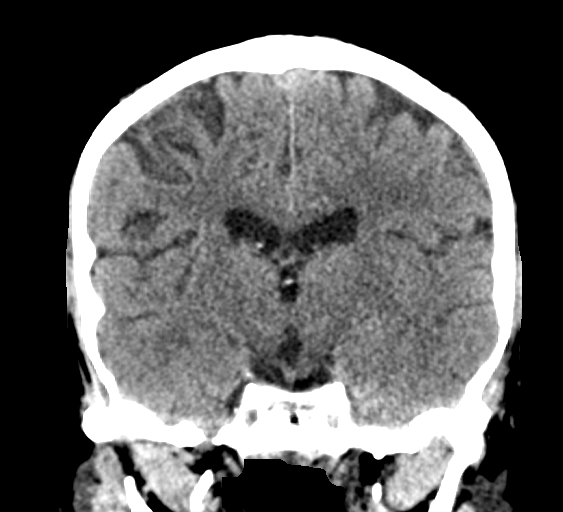
[im 36/67  brain]
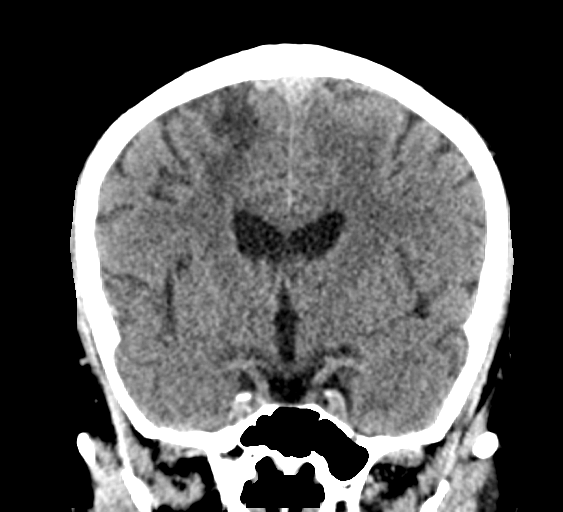

[Series 6: head without sag · sagittal · non-contrast · 0.32mm/px · 3 of 54 slices shown]
[im 18/54  brain]
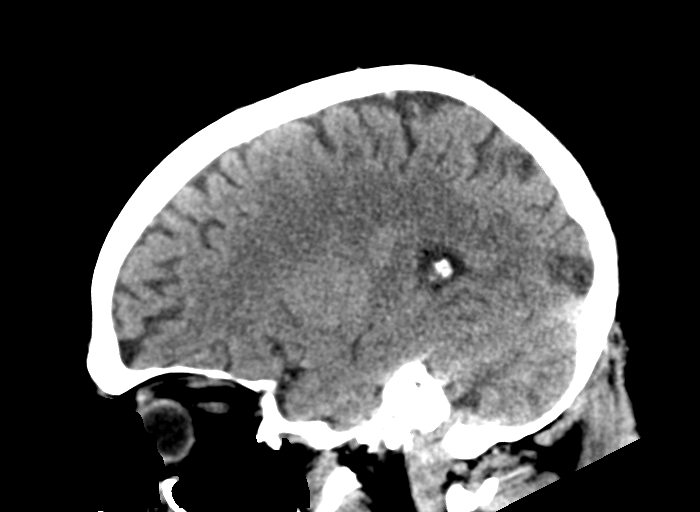
[im 27/54  brain]
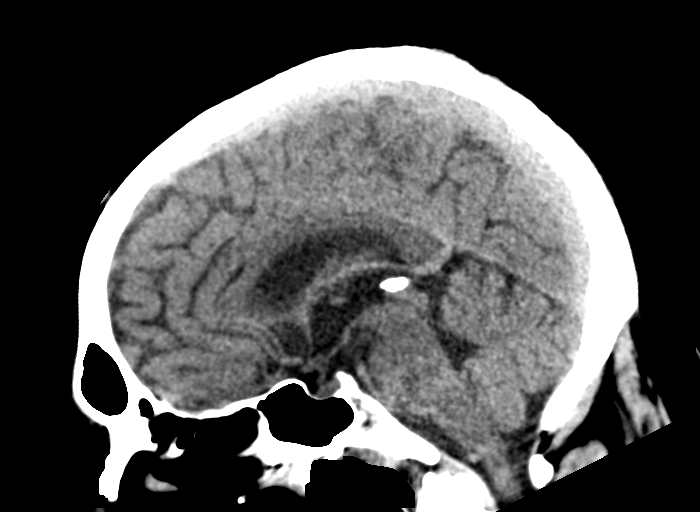
[im 36/54  brain]
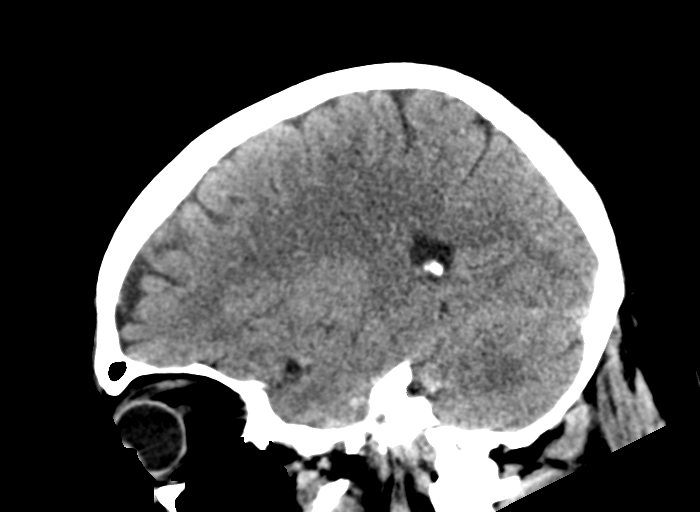

[16 of 47 positions shown; findings below may reference images not displayed]

FINDINGS: Brain: Multifocal somewhat rounded areas of hypoattenuation
scattered throughout the brain including within the high right
parietal lobe (series 3, image 6), left periventricular white matter
and 22), and right cerebellum (series 3, image 27). There may be
mild vasogenic edema associated with the right parietal abnormality.
No intracranial hemorrhage or extra-axial collection is seen. No
hydrocephalus. Focal encephalomalacia adjacent to the frontal horn
of the right lateral ventricle may represent prior lacunar infarct
with associated ex vacuo dilatation.

Vascular: No hyperdense vessel or unexpected calcification.

Skull: Normal. Negative for fracture or focal lesion.

Sinuses/Orbits: Small mucous retention cyst versus sinonasal polyp
within the inferior right maxillary sinus. Remaining paranasal
sinuses are clear. Orbital structures appear intact and
unremarkable.

Other: None.
IMPRESSION: Multifocal areas of hypoattenuation scattered throughout the
bilateral supratentorial brain and right cerebellum. There may be
mild associated vasogenic edema with a high right parietal lesion.
Findings are suspicious for multifocal brain metastases. Further
evaluation with contrast-enhanced MRI of the brain is recommended.

These results were called by telephone at the time of interpretation
acknowledged these results.

## 2019-06-02 MED ORDER — SODIUM CHLORIDE 0.9 % IV SOLN: INTRAVENOUS | Status: AC

## 2019-06-02 MED ORDER — SODIUM CHLORIDE 0.9 % IV SOLN: 250.0000 mL | INTRAVENOUS | Status: DC

## 2019-06-02 MED ORDER — CLOPIDOGREL BISULFATE 75 MG PO TABS: 75.0000 mg | ORAL_TABLET | Freq: Every day | ORAL | Status: DC

## 2019-06-02 MED ORDER — NICOTINE 21 MG/24HR TD PT24
21.0000 mg | MEDICATED_PATCH | Freq: Every day | TRANSDERMAL | Status: DC
  Administered 2019-06-02 – 2019-06-12 (×11): 21 mg via TRANSDERMAL
  Filled 2019-06-02 (×11): qty 1

## 2019-06-02 MED ORDER — ENOXAPARIN SODIUM 40 MG/0.4ML ~~LOC~~ SOLN
40.0000 mg | SUBCUTANEOUS | Status: DC
  Administered 2019-06-02 – 2019-06-03 (×2): 40 mg via SUBCUTANEOUS
  Filled 2019-06-02 (×2): qty 0.4

## 2019-06-02 MED ORDER — ASPIRIN 81 MG PO CHEW
324.0000 mg | CHEWABLE_TABLET | Freq: Once | ORAL | Status: AC
  Administered 2019-06-02: 324 mg via ORAL
  Filled 2019-06-02: qty 4

## 2019-06-02 MED ORDER — HYDROCORTISONE NA SUCCINATE PF 100 MG IJ SOLR
50.0000 mg | Freq: Four times a day (QID) | INTRAMUSCULAR | Status: DC
  Administered 2019-06-02 – 2019-06-03 (×3): 50 mg via INTRAVENOUS
  Filled 2019-06-02 (×3): qty 2

## 2019-06-02 MED ORDER — NOREPINEPHRINE 4 MG/250ML-% IV SOLN: 2.0000 ug/min | INTRAVENOUS | Status: DC

## 2019-06-02 MED ORDER — CLOPIDOGREL BISULFATE 75 MG PO TABS
300.0000 mg | ORAL_TABLET | Freq: Once | ORAL | Status: DC
  Filled 2019-06-02: qty 4

## 2019-06-02 MED ORDER — LORAZEPAM 2 MG/ML IJ SOLN
1.0000 mg | Freq: Once | INTRAMUSCULAR | Status: AC
  Administered 2019-06-02: 1 mg via INTRAVENOUS
  Filled 2019-06-02: qty 1

## 2019-06-02 MED ORDER — ASPIRIN 325 MG PO TABS
325.0000 mg | ORAL_TABLET | Freq: Every day | ORAL | Status: DC
  Filled 2019-06-02: qty 1

## 2019-06-02 MED ORDER — ATORVASTATIN CALCIUM 80 MG PO TABS
80.0000 mg | ORAL_TABLET | Freq: Every day | ORAL | Status: DC
  Filled 2019-06-02: qty 1

## 2019-06-02 MED ORDER — IOHEXOL 350 MG/ML SOLN
100.0000 mL | Freq: Once | INTRAVENOUS | Status: AC | PRN
  Administered 2019-06-02: 100 mL via INTRAVENOUS

## 2019-06-02 MED ORDER — SODIUM CHLORIDE 0.9 % IV BOLUS
1000.0000 mL | Freq: Once | INTRAVENOUS | Status: AC
  Administered 2019-06-02: 17:00:00 1000 mL via INTRAVENOUS

## 2019-06-02 MED ORDER — SODIUM CHLORIDE 0.9 % IV BOLUS
1000.0000 mL | Freq: Once | INTRAVENOUS | Status: AC
  Administered 2019-06-02: 1000 mL via INTRAVENOUS

## 2019-06-02 NOTE — ED Provider Notes (Signed)
[facility] EMERGENCY DEPARTMENT Provider Note   CSN: [id number] Arrival date & time: 06/02/19  1024     History Chief Complaint  Patient presents with  . Weakness  . Aphasia    Adrian [name] is a 54 y.o. male.  HPI   53yM with stroke like symptoms. History primarily from significant other at bedside. Last known normal at 11:00 yesterday (06/01/19). Found at work laying on the ground vomiting. Sent home. Had to be helped inside. Speech was slurred. Was complaining of a headache. Significant other called EMS but he refused transport. Symptoms persistent today and patient agreeable to come in for evaluation. His speech is difficult to understand limiting further history.  History reviewed. No pertinent past medical history.  There are no problems to display for this patient.   History reviewed. No pertinent surgical history.     No family history on file.  Social History   Tobacco Use  . Smoking status: Not on file  Substance Use Topics  . Alcohol use: Not on file  . Drug use: Not on file    Home Medications Prior to Admission medications   Not on File    Allergies    Patient has no allergy information on record.  Review of Systems   Review of Systems Level 5 caveat because of language barrier.  Physical Exam Updated Vital Signs BP (!) 140/120 (BP Location: Right Arm)   Pulse 76   Temp 98.2 F (36.8 C) (Oral)   Resp 17   Ht 5\' 7"  (1.702 m)   SpO2 95%   Physical Exam Vitals and nursing note reviewed.  Constitutional:      General: He is not in acute distress.    Appearance: He is well-developed.  HENT:     Head: Normocephalic and atraumatic.  Eyes:     General:        Right eye: No discharge.        Left eye: No discharge.     Conjunctiva/sclera: Conjunctivae normal.  Cardiovascular:     Rate and Rhythm: Normal rate and regular rhythm.     Heart sounds: Normal heart sounds. No murmur. No friction rub. No gallop.   Pulmonary:       Effort: Pulmonary effort is normal. No respiratory distress.     Breath sounds: Normal breath sounds.  Abdominal:     General: There is no distension.     Palpations: Abdomen is soft.     Tenderness: There is no abdominal tenderness.  Musculoskeletal:        General: No tenderness.     Cervical back: Neck supple.  Skin:    General: Skin is warm and dry.  Neurological:     Mental Status: He is alert.     Comments: Oriented to self, place and day of week only. Severe dysarthria, possible dysphasia. L pupil 70mm, R pupil 63mm. Both reactive. CN otherwise seem intact. 4/5 strength L side. 2/5 on command right although may be effort related to some degree (when I explained to him that his symptoms were consistent with a stroke he began crying and moved his R hand to cover his eyes).   Psychiatric:        Behavior: Behavior normal.        Thought Content: Thought content normal.     ED Results / Procedures / Treatments   Labs (all labs ordered are listed, but only abnormal results are displayed) Labs Reviewed  CBC -  Abnormal; Notable for the following components:      Result Value   WBC 21.3 (*)    All other components within normal limits  DIFFERENTIAL - Abnormal; Notable for the following components:   Neutro Abs 13.8 (*)    Lymphs Abs 6.0 (*)    Monocytes Absolute 1.3 (*)    All other components within normal limits  COMPREHENSIVE METABOLIC PANEL - Abnormal; Notable for the following components:   Glucose, Bld 111 (*)    BUN 24 (*)    ALT 46 (*)    All other components within normal limits  CBG MONITORING, ED - Abnormal; Notable for the following components:   Glucose-Capillary 105 (*)    All other components within normal limits  CBG MONITORING, ED - Abnormal; Notable for the following components:   Glucose-Capillary 108 (*)    All other components within normal limits  SARS CORONAVIRUS 2 (TAT 6-24 HRS)  ETHANOL  PROTIME-INR  RAPID URINE DRUG SCREEN, HOSP PERFORMED   URINALYSIS, ROUTINE W REFLEX MICROSCOPIC    EKG EKG Interpretation  Date/Time:  Wednesday June 02 2019 10:33:08 EDT Ventricular Rate:  71 PR Interval:  138 QRS Duration: 82 QT Interval:  408 QTC Calculation: 443 R Axis:   43 Text Interpretation: Normal sinus rhythm Nonspecific ST abnormality Abnormal ECG Confirmed by Virgel Manifold [phone]) on 06/02/2019 1:07:13 PM   Radiology CT HEAD WO CONTRAST  Result Date: 06/02/2019 CLINICAL DATA:  Right arm weakness, slurred speech, unequal pupils EXAM: CT HEAD WITHOUT CONTRAST TECHNIQUE: Contiguous axial images were obtained from the base of the skull through the vertex without intravenous contrast. COMPARISON:  None. FINDINGS: Brain: Multifocal somewhat rounded areas of hypoattenuation scattered throughout the brain including within the high right parietal lobe (series 3, image 6), left periventricular white matter (series 3, image 13), bilateral occipital lobes (series 3, images 20 and 22), and right cerebellum (series 3, image 27). There may be mild vasogenic edema associated with the right parietal abnormality. No intracranial hemorrhage or extra-axial collection is seen. No hydrocephalus. Focal encephalomalacia adjacent to the frontal horn of the right lateral ventricle may represent prior lacunar infarct with associated ex vacuo dilatation. Vascular: No hyperdense vessel or unexpected calcification. Skull: Normal. Negative for fracture or focal lesion. Sinuses/Orbits: Small mucous retention cyst versus sinonasal polyp within the inferior right maxillary sinus. Remaining paranasal sinuses are clear. Orbital structures appear intact and unremarkable. Other: None. IMPRESSION: Multifocal areas of hypoattenuation scattered throughout the bilateral supratentorial brain and right cerebellum. There may be mild associated vasogenic edema with a high right parietal lesion. Findings are suspicious for multifocal brain metastases. Further evaluation with  contrast-enhanced MRI of the brain is recommended. These results were called by telephone at the time of interpretation on 06/02/2019 at 11:40 am to provider [facility] , who verbally acknowledged these results. Electronically Signed   By: Davina Poke D.O.   On: 06/02/2019 11:42   MR BRAIN WO CONTRAST  Result Date: 06/02/2019 CLINICAL DATA:  Speech difficulty. EXAM: MRI HEAD WITHOUT CONTRAST TECHNIQUE: Multiplanar, multiecho pulse sequences of the brain and surrounding structures were obtained without intravenous contrast. COMPARISON:  Noncontrast head CT 06/02/2019 FINDINGS: Brain: Please note the patient was unable to tolerate the full examination. As a result, postcontrast imaging could not be obtained. The acquired sequences are intermittently motion degraded. There are multiple small scattered foci of cortical and subcortical restricted diffusion within the bilateral cerebral hemispheres consistent with acute infarcts. These are most notably within the bilateral parietooccipital  lobes, but also affect the bilateral frontal lobes. Additional small acute infarct within the left corona radiata. Multiple acute lacunar infarcts within the left thalamus. Multiple acute infarcts within the pons. Multiple acute infarcts within the right cerebellum. Corresponding T2/FLAIR hyperintensity at these sites. No significant mass effect. Chronic infarct within the right frontal lobe motor strip and high right parietal lobe. Background mild scattered T2/FLAIR hyperintensity within the cerebral white matter is nonspecific, but consistent with chronic small vessel ischemic disease. Mild generalized parenchymal atrophy. There is no evidence of intracranial mass. No midline shift or extra-axial fluid collection. No chronic intracranial blood products. Vascular: No definite loss of expected proximal arterial flow voids. Skull and upper cervical spine: No focal marrow lesion. Sinuses/Orbits: Visualized orbits demonstrate no  acute abnormality. Mild ethmoid and right maxillary sinus mucosal thickening. No significant mastoid effusion. IMPRESSION: The patient was unable to tolerate the full examination. As a result, postcontrast imaging could not be obtained. Numerous small supratentorial and infratentorial acute infarcts involving the cerebral hemispheres, left thalamus, pons and right cerebellar hemisphere. Given involvement of multiple vascular territories, findings are suggestive of an embolic process. Chronic right frontoparietal cortical infarcts. Background mild generalized parenchymal atrophy and chronic small vessel ischemic disease. Electronically Signed   By: Jackey Loge DO   On: 06/02/2019 15:24    Procedures Procedures (including critical care time)  Medications Ordered in ED Medications  sodium chloride 0.9 % bolus 1,000 mL (has no administration in time range)    ED Course  I have reviewed the triage vital signs and the nursing notes.  Pertinent labs & imaging results that were available during my care of the patient were reviewed by me and considered in my medical decision making (see chart for details).    MDM Rules/Calculators/A&P                       54 year old male with stroke like symptoms.  Arrived 24 hours after last known normal.  Initial CT concerning for possible metastatic disease.  Subsequent MRI though more consistent with stroke.  Neurology consulted.  Admission.  Final Clinical Impression(s) / ED Diagnoses Final diagnoses:  Cerebrovascular accident (CVA) due to embolism of cerebral artery [facility])    Rx / DC Orders ED Discharge Orders    None       Raeford Razor, MD 06/09/19 [phone]

## 2019-06-02 NOTE — ED Provider Notes (Signed)
Signout from Dr. Juleen China.  54 year old male here with multiple infarcts.  He has been evaluated by Dr. Nicholes Calamity from neurology.  Plan is to admit him to unassigned. Physical Exam  BP (!) 85/60   Pulse (!) 101   Temp 98.4 F (36.9 C) (Oral)   Resp 19   Ht 5\' 7"  (1.702 m)   Wt 68 kg   SpO2 99%   BMI 23.49 kg/m   Physical Exam  ED Course/Procedures     Procedures  MDM  Discussed with physicians from the family practice team who will evaluate the patient for admission.       , MD 06/03/19 [phone]

## 2019-06-02 NOTE — H&P (Signed)
Family Medicine Teaching [facility] Admission History and Physical Service Pager: [phone]  Patient name: Adrian [name] Medical record number: [id number] Date of birth: [birth year] Age: 54 y.o. Gender: male  Primary Care Provider: Marin Comment, FNP Consultants: Neuro Code Status: Full Preferred Emergency Contact: [name] (wife)- [phone]  Chief Complaint:  Weakness and slurred speech  Assessment and Plan: Adrian [name] is a 54 y.o. male presenting with weakness and slurred speech . PMH is significant for Tobacco use, Back pain  Weakness and slurred speech secondary to CVA Patient presents with weakness and dysarthria. Last known normal state was the morning of 04/13 at around 7 am.  History reports patient was found laying down initially and also reports of body shaking as well as vomiting with incontinence of urine.  EMS was called and patient refused to go to hospital.  His symptoms progressed and prompted his wife to bring him to hospital.  CT head showed multifocal symptoms of hypoattenuation throughout the bilateral supratentorial and right cerebellum with some mild associated vasogenic edema with a right parietal lesion.  Suspicious for brain metastases.  MRI brain consistent with multiple small supratentorial and infratentorial acute infarcts involving the cerebral hemispheres, left thalamus, pons and right cerebellar hemisphere, suggestive of embolic process. MRI cervical spine negative for canal stenosis. CTA head/neck pending.  EKG SR without ST changes.  Labs significant for Glu 111, WBC 21.3 with neutrophilic shift 13.8.  UDS negative and urinalysis normal. COVID negative. Lipid profile Cholesterol 221, HDL 40, Triglycerides 414 and LDL/VLDL unable to calculate. HbA1c 5.8.  In the ED Neurology was consulted and evaluated.  Concerns for bilateral cerebral infarcts thought to be most likely cardioembolic.  Pt received ASA 324 mg, Lorazepam 1mg  IV x1 and N/S bolus 2L.  On exam  patient dysarthric and obeys commands.  He has weakness of upper and lower extremities. Sensory intact. NIHSS 14. Differentials considered seizure, brain lesion and Cavernous Sinus th.  Given that patient was thought to be down for about 30 mins, reports urinary incontinence still remains high on the differential.  Will obtain EEG and CK to complete workup.  Brain lesion less likely as CT head and MRI negative for space occupying lesion.  Low suspicion for Cerebral Venous Sinus Thrombosis given patient did not received COVID vaccine and no thrombocytopenia, platelets 293. We were planning to admit but Dr. called and planned to admit to Neuro ICU as results from CTA head and neck shows occlusive thrombi in the distal right vertebral artery, left vertebral artery, proximal and mid basilar artery and PICA.  Aortic arche findings reflect eccentric thrombus. -Admit to Progressive, Attending Dr. Amada Jupiter -Neurology consult, appreciate recommendations -CTA head/neck -ECHO -MRI neck -Lipid profile, HbA1c, CK labs -BMP, CBC, TSH, in am -Repeat EKG in am -Vital signs per floor -Neuro checks every 4 hrs -Seizure and fall precautions -Continuous cardiac and pulse ox monitoring -NPO -DAPT x 3 weeks, ASA 325 mg and Plavix 75 mg daily-when passed swallow study -Allow for Permissive HTN 220/156mmHg -Nursing bedside swallow -Speech/PT/OT  Tobacco use Patient reports smokes cigarettes 3 packs day for years.   -Nicoderm patch 21 mg daily -Encourage smoking cessation  Chronic Pain Patient reports taking prednisone packs off and on for a few months.  Wife reports that he does tolerate Ibuprofen and Tylenol.   Currently on day 8 of 12 day Pred pack. -continue to monitor -KPad as needed  FEN/GI:  -NPO Prophylaxis:  -SCD  Disposition: Progressive, Attending Dr. 30m  History  of Present Illness:  Adrian [name] is a 54 y.o. male presenting with dysarthria and weakness and found to have CVA.    History difficult to obtain from patient due to dysarthria. History provided primarily by wife. Patient was a work with he was found to be lying down and vomiting. His coworkers brought him home where he continued to have difficulty standing and continued to vomiting. Wife notes he was having rapid body movements, drooling, and his eyes were very wide. He had 4 episodes of urinary incontinence. Wife notes his last known normal was 7am on 4/13. Patient had been complaining of headache and dizziness. He notes the headache was very severe yesterday. He does not have a history of headaches.   After he was brought home yesterday morning. He refused to go to the ED. He stayed home throughout the day yesterday and slowly declined. His ability to ambulate declined. He did try to eat a cheese sandwitch yesterday but had difficulty.  Per wife, patient he endorsed worsening shortness of breath starting last night.  Denies any chest pain but difficult to obtain history due to dysarthria.   He sees Dr. Marin Comment in [place], [place]. His medications include Protonix, gabapentin, Nexium, zyrtec, dramamine, and intermittent dose packs of Prednisone for back pain.   Smokes 3 packs/day x at least 30 years. No alcohol. No illicit drugs.  Denies history of seizures.  Review Of Systems: Per HPI with the following additions:   Review of Systems  Constitutional: Negative for fever.  HENT: Positive for drooling.   Respiratory: Positive for cough and shortness of breath. Negative for chest tightness.   Cardiovascular: Negative for chest pain and leg swelling.  Genitourinary: Negative for difficulty urinating.  Neurological: Positive for dizziness, facial asymmetry, speech difficulty and headaches. Negative for seizures and numbness.     There are no problems to display for this patient.   Past Medical History: Past Medical History:  Diagnosis Date  . Tobacco abuse     Past Surgical History: History  reviewed. No pertinent surgical history.  Social History: Social History   Tobacco Use  . Smoking status: Current Every Day Smoker    Packs/day: 4.00    Types: Cigarettes  Substance Use Topics  . Alcohol use: Not on file  . Drug use: Not on file   Additional social history:   Please also refer to relevant sections of EMR.  Family History: Family History  Problem Relation Age of Onset  . Hypertension Mother   . Hypertension Father    (If not completed, MUST add something in)  Allergies and Medications: Allergies  Allergen Reactions  . Codeine Other (See Comments)    unknown  . Motrin [Ibuprofen] Other (See Comments)    unknown  . Penicillins Other (See Comments)    unknown   No current facility-administered medications on file prior to encounter.   Current Outpatient Medications on File Prior to Encounter  Medication Sig Dispense Refill  . gabapentin (NEURONTIN) 600 MG tablet Take 600 mg by mouth 3 (three) times daily.    . pantoprazole (PROTONIX) 40 MG tablet Take 40 mg by mouth daily.    . predniSONE (DELTASONE) 10 MG tablet Take 10 mg by mouth See admin instructions. Day 5-8 Take one tablet four times daily Day 9-12 Take one tablet twice daily      Objective: BP (!) 89/72   Pulse 70   Temp 98.4 F (36.9 C) (Oral)   Resp 19   Ht   (1.702 m)   Wt 68 kg   SpO2 96%   BMI 23.49 kg/m  Exam:  General: 54 yo male looks older than stated age, in no apparent distress  Eyes: PEERLA ENTM: No pharyengeal erythema, mucus membranes moist Neck: nontender Cardiovascular: RRR with no murmurs noted Respiratory: CTA bilaterally  Gastrointestinal: Bowel sounds present. No abdominal pain Extremities: no lower extremity edema  Derm: No rashes noted Neuro:Alert,oriented and follows commands.Dysarthric without aphasia.  Generalized weakness.  No obvious facial droop, unable to protrude tongue.  Sensory normal, strength 2/5 in all extremities.    Labs and Imaging: CBC  BMET  Recent Labs  Lab 06/02/19 1058  WBC 21.3*  HGB 17.0  HCT 50.6  PLT 293   Recent Labs  Lab 06/02/19 1058  NA 140  K 3.6  CL 102  CO2 24  BUN 24*  CREATININE 1.03  GLUCOSE 111*  CALCIUM 9.2     EKG: SR  CT ANGIO HEAD W OR WO CONTRAST  Result Date: 06/02/2019 CLINICAL DATA:  Stroke suspected. EXAM: CT ANGIOGRAPHY HEAD AND NECK TECHNIQUE: Multidetector CT imaging of the head and neck was performed using the standard protocol during bolus administration of intravenous contrast. Multiplanar CT image reconstructions and MIPs were obtained to evaluate the vascular anatomy. Carotid stenosis measurements (when applicable) are obtained utilizing NASCET criteria, using the distal internal carotid diameter as the denominator. CONTRAST:  OMNIPAQUE IOHEXOL 350 MG/ML SOLN COMPARISON:  Brain MRI 06/02/2019. FINDINGS: CTA NECK FINDINGS Aortic arch: Standard aortic branching. There is an eccentric curvilinear filling defect within the lumen of aortic arch measuring 2.0 x 4.7 cm (series 7, image 344). Findings likely reflect eccentric thrombus. Additionally, there is occlusive thrombus within the proximal to mid innominate artery. There is reconstitution of flow at the level of the distal innominate artery. No significant proximal subclavian stenosis. Right carotid system: CCA and ICA patent within the neck without significant stenosis (50% or greater). Left carotid system: CCA and ICA patent within the neck without significant stenosis (50% or greater). Eccentric hypodensity within the mid left common carotid artery has an appearance most suggestive of soft plaque. Eccentric thrombus cannot be definitively excluded. Vertebral arteries: The vertebral artery is slightly dominant. The vertebral arteries are patent within the neck. Soft plaque at the vertebral artery origins bilaterally with mild ostial stenosis on the left Skeleton: No acute bony abnormality or aggressive osseous lesion. Other neck:  No neck mass or cervical lymphadenopathy. Chronic fracture deformity of the T1 spinous process Upper chest: No consolidation within the imaged lung apices. Review of the MIP images confirms the above findings CTA HEAD FINDINGS Anterior circulation: The intracranial internal carotid arteries are patent without significant stenosis. The M1 middle cerebral arteries are patent without significant stenosis. No M2 proximal branch occlusion or high-grade proximal stenosis is identified. The anterior cerebral arteries are patent without significant proximal stenosis. No intracranial aneurysm is identified. Posterior circulation: There is occlusive thrombus within the distal V4 right vertebral artery. Occlusive thrombus is also suspected within the distal V4 left vertebral artery. Occlusive thrombus extends into the proximal and mid basilar artery. There is reconstitution of flow within the distal basilar artery. Intermittent and irregular opacification of the right PICA is seen and this vessel likely a contains multifocal thrombus. Flow is seen within the proximal left PICA. The AICA vessels are not well delineated. Flow is seen within the superior cerebellar arteries bilaterally. The posterior cerebral arteries are patent proximally without significant stenosis. However, emboli  are suspected within the distal posterior cerebral arteries bilaterally given the pattern of acute infarcts demonstrated on brain MRI performed earlier the same day. Posterior communicating arteries are not definitively identified. Venous sinuses: Within limitations of contrast timing, no convincing thrombus. Anatomic variants: As described. Review of the MIP images confirms the above findings These results were called by telephone at the time of interpretation on 06/02/2019 at 7:11 pm to provider Doralee AlbinoWILLIAM HENSEL , who verbally acknowledged these results. IMPRESSION: CTA neck: 1. Eccentric endoluminal thrombus within the aortic arch as described. 2.  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 3. The bilateral common and internal carotid arteries are patent within the neck without significant stenosis. Eccentric hypodensity within the mid left common carotid artery has an appearance most suggestive of soft plaque. Eccentric thrombus at this site cannot be definitively excluded. 4. The vertebral arteries are patent within the neck bilaterally. Soft plaque at both vertebral artery origins with mild ostial stenosis on the left. CTA head: 1. Occlusive thrombus within the distal V4 right vertebral artery with occlusive thrombus extending into the proximal and mid basilar artery. Occlusive thrombus is also suspected within the very distal V4 left vertebral artery. There is reconstitution of flow within the distal basilar artery. 2. Only intermittent and irregular enhancement of the right PICA is seen and there is suspected multifocal thrombus within this vessel. 3. The bilateral AICAs are poorly delineated. 4. The posterior cerebral arteries are patent proximally without significant stenosis. However, emboli are suspected within the distal PCAs bilaterally given the pattern of acute infarcts demonstrated on same-day brain MRI. 5. No anterior circulation large vessel occlusion or proximal high-grade stenosis is identified. Electronically Signed   By: Jackey LogeKyle  Golden DO   On: 06/02/2019 19:30   CT HEAD WO CONTRAST  Result Date: 06/02/2019 CLINICAL DATA:  Right arm weakness, slurred speech, unequal pupils EXAM: CT HEAD WITHOUT CONTRAST TECHNIQUE: Contiguous axial images were obtained from the base of the skull through the vertex without intravenous contrast. COMPARISON:  None. FINDINGS: Brain: Multifocal somewhat rounded areas of hypoattenuation scattered throughout the brain including within the high right parietal lobe (series 3, image 6), left periventricular white matter (series 3, image 13), bilateral occipital lobes (series 3, images 20 and  22), and right cerebellum (series 3, image 27). There may be mild vasogenic edema associated with the right parietal abnormality. No intracranial hemorrhage or extra-axial collection is seen. No hydrocephalus. Focal encephalomalacia adjacent to the frontal horn of the right lateral ventricle may represent prior lacunar infarct with associated ex vacuo dilatation. Vascular: No hyperdense vessel or unexpected calcification. Skull: Normal. Negative for fracture or focal lesion. Sinuses/Orbits: Small mucous retention cyst versus sinonasal polyp within the inferior right maxillary sinus. Remaining paranasal sinuses are clear. Orbital structures appear intact and unremarkable. Other: None. IMPRESSION: Multifocal areas of hypoattenuation scattered throughout the bilateral supratentorial brain and right cerebellum. There may be mild associated vasogenic edema with a high right parietal lesion. Findings are suspicious for multifocal brain metastases. Further evaluation with contrast-enhanced MRI of the brain is recommended. These results were called by telephone at the time of interpretation on 06/02/2019 at 11:40 am to provider [facility] KOHUT , who verbally acknowledged these results. Electronically Signed   By: Duanne GuessNicholas  Plundo D.O.   On: 06/02/2019 11:42   CT ANGIO NECK W OR WO CONTRAST  Result Date: 06/02/2019 CLINICAL DATA:  Stroke suspected. EXAM: CT ANGIOGRAPHY HEAD AND NECK TECHNIQUE: Multidetector CT imaging of the head and neck was  performed using the standard protocol during bolus administration of intravenous contrast. Multiplanar CT image reconstructions and MIPs were obtained to evaluate the vascular anatomy. Carotid stenosis measurements (when applicable) are obtained utilizing NASCET criteria, using the distal internal carotid diameter as the denominator. CONTRAST:  OMNIPAQUE IOHEXOL 350 MG/ML SOLN COMPARISON:  Brain MRI 06/02/2019. FINDINGS: CTA NECK FINDINGS Aortic arch: Standard aortic branching.  There is an eccentric curvilinear filling defect within the lumen of aortic arch measuring 2.0 x 4.7 cm (series 7, image 344). Findings likely reflect eccentric thrombus. Additionally, there is occlusive thrombus within the proximal to mid innominate artery. There is reconstitution of flow at the level of the distal innominate artery. No significant proximal subclavian stenosis. Right carotid system: CCA and ICA patent within the neck without significant stenosis (50% or greater). Left carotid system: CCA and ICA patent within the neck without significant stenosis (50% or greater). Eccentric hypodensity within the mid left common carotid artery has an appearance most suggestive of soft plaque. Eccentric thrombus cannot be definitively excluded. Vertebral arteries: The vertebral artery is slightly dominant. The vertebral arteries are patent within the neck. Soft plaque at the vertebral artery origins bilaterally with mild ostial stenosis on the left Skeleton: No acute bony abnormality or aggressive osseous lesion. Other neck: No neck mass or cervical lymphadenopathy. Chronic fracture deformity of the T1 spinous process Upper chest: No consolidation within the imaged lung apices. Review of the MIP images confirms the above findings CTA HEAD FINDINGS Anterior circulation: The intracranial internal carotid arteries are patent without significant stenosis. The M1 middle cerebral arteries are patent without significant stenosis. No M2 proximal branch occlusion or high-grade proximal stenosis is identified. The anterior cerebral arteries are patent without significant proximal stenosis. No intracranial aneurysm is identified. Posterior circulation: There is occlusive thrombus within the distal V4 right vertebral artery. Occlusive thrombus is also suspected within the distal V4 left vertebral artery. Occlusive thrombus extends into the proximal and mid basilar artery. There is reconstitution of flow within the distal  basilar artery. Intermittent and irregular opacification of the right PICA is seen and this vessel likely a contains multifocal thrombus. Flow is seen within the proximal left PICA. The AICA vessels are not well delineated. Flow is seen within the superior cerebellar arteries bilaterally. The posterior cerebral arteries are patent proximally without significant stenosis. However, emboli are suspected within the distal posterior cerebral arteries bilaterally given the pattern of acute infarcts demonstrated on brain MRI performed earlier the same day. Posterior communicating arteries are not definitively identified. Venous sinuses: Within limitations of contrast timing, no convincing thrombus. Anatomic variants: As described. Review of the MIP images confirms the above findings These results were called by telephone at the time of interpretation on 06/02/2019 at 7:11 pm to provider Doralee Albino , who verbally acknowledged these results. IMPRESSION: CTA neck: 1. Eccentric endoluminal thrombus within the aortic arch as described. 2. 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 3. The bilateral common and internal carotid arteries are patent within the neck without significant stenosis. Eccentric hypodensity within the mid left common carotid artery has an appearance most suggestive of soft plaque. Eccentric thrombus at this site cannot be definitively excluded. 4. The vertebral arteries are patent within the neck bilaterally. Soft plaque at both vertebral artery origins with mild ostial stenosis on the left. CTA head: 1. Occlusive thrombus within the distal V4 right vertebral artery with occlusive thrombus extending into the proximal and mid basilar artery. Occlusive thrombus is also suspected  within the very distal V4 left vertebral artery. There is reconstitution of flow within the distal basilar artery. 2. Only intermittent and irregular enhancement of the right PICA is seen and there is  suspected multifocal thrombus within this vessel. 3. The bilateral AICAs are poorly delineated. 4. The posterior cerebral arteries are patent proximally without significant stenosis. However, emboli are suspected within the distal PCAs bilaterally given the pattern of acute infarcts demonstrated on same-day brain MRI. 5. No anterior circulation large vessel occlusion or proximal high-grade stenosis is identified. Electronically Signed   By: Kellie Simmering DO   On: 06/02/2019 19:30   MR BRAIN WO CONTRAST  Result Date: 06/02/2019 CLINICAL DATA:  Speech difficulty. EXAM: MRI HEAD WITHOUT CONTRAST TECHNIQUE: Multiplanar, multiecho pulse sequences of the brain and surrounding structures were obtained without intravenous contrast. COMPARISON:  Noncontrast head CT 06/02/2019 FINDINGS: Brain: Please note the patient was unable to tolerate the full examination. As a result, postcontrast imaging could not be obtained. The acquired sequences are intermittently motion degraded. There are multiple small scattered foci of cortical and subcortical restricted diffusion within the bilateral cerebral hemispheres consistent with acute infarcts. These are most notably within the bilateral parietooccipital lobes, but also affect the bilateral frontal lobes. Additional small acute infarct within the left corona radiata. Multiple acute lacunar infarcts within the left thalamus. Multiple acute infarcts within the pons. Multiple acute infarcts within the right cerebellum. Corresponding T2/FLAIR hyperintensity at these sites. No significant mass effect. Chronic infarct within the right frontal lobe motor strip and high right parietal lobe. Background mild scattered T2/FLAIR hyperintensity within the cerebral white matter is nonspecific, but consistent with chronic small vessel ischemic disease. Mild generalized parenchymal atrophy. There is no evidence of intracranial mass. No midline shift or extra-axial fluid collection. No chronic  intracranial blood products. Vascular: No definite loss of expected proximal arterial flow voids. Skull and upper cervical spine: No focal marrow lesion. Sinuses/Orbits: Visualized orbits demonstrate no acute abnormality. Mild ethmoid and right maxillary sinus mucosal thickening. No significant mastoid effusion. IMPRESSION: The patient was unable to tolerate the full examination. As a result, postcontrast imaging could not be obtained. Numerous small supratentorial and infratentorial acute infarcts involving the cerebral hemispheres, left thalamus, pons and right cerebellar hemisphere. Given involvement of multiple vascular territories, findings are suggestive of an embolic process. Chronic right frontoparietal cortical infarcts. Background mild generalized parenchymal atrophy and chronic small vessel ischemic disease. Electronically Signed   By: Kellie Simmering DO   On: 06/02/2019 15:24   MR CERVICAL SPINE WO CONTRAST  Result Date: 06/02/2019 CLINICAL DATA:  Spinal stenosis EXAM: MRI CERVICAL SPINE WITHOUT CONTRAST TECHNIQUE: Multiplanar, multisequence MR imaging of the cervical spine was performed. No intravenous contrast was administered. COMPARISON:  None. FINDINGS: Significant motion artifact is present Alignment: Trace degenerative listhesis. Vertebrae: Vertebral body heights appear preserved apart from mild degenerative endplate irregularity. No definite marrow edema. Cord: No definite abnormal signal. Posterior Fossa, vertebral arteries, paraspinal tissues: Small right cerebellar infarct. Refer to MRI brain earlier same day Disc levels: C2-C3: Small central disc protrusion indents the ventral thecal sac. Facet hypertrophy no significant canal or foraminal stenosis C3-C4: Facet hypertrophy. No significant canal or foraminal stenosis. C4-C5: Probable left paracentral disc osteophyte complex. No canal stenosis. Probable left foraminal stenosis. C5-C6: Disc bulge with endplate osteophytic ridging eccentric to  the left. Probable mild canal stenosis. Probable bilateral foraminal stenosis. C6-C7: Disc bulge with endplate osteophytic ridging eccentric to the left. No significant canal or foraminal stenosis. C7-T1:  No significant canal or foraminal stenosis. IMPRESSION: Motion degraded study. There is no high-grade canal stenosis. Probable foraminal stenosis, which is difficult to evaluate in the setting of artifact. Electronically Signed   By: Guadlupe Spanish M.D.   On: 06/02/2019 18:15    Dana Allan, MD 06/02/2019, 4:43 PM PGY-1, [facility] FPTS Intern pager: [phone], text pages welcome

## 2019-06-02 NOTE — H&P (Addendum)
Neurology Consultation  Reason for Consult: Stroke Referring Physician: Dr. Charm Barges  CC: Dysarthria/weakness  History is obtained from: Patient and chart  HPI: JAMIL ARMWOOD is a 54 y.o. male with history of tobacco abuse.  Patient came to the ED with strokelike symptoms.  MRI of brain was obtained and confirmed multiple small punctate strokes in both hemispheres.  Neurology was asked to consult for strokes.    Patient was working outside yesterday when he noted he felt dizzy and laid down on the grass at approximately 1030.  At 1130 his coworkers found him.  And brought him to one of the coworkers house.  He states that at that time he suddenly really felt the effects of his strokes.  His speech got significantly slurred, he was having difficulty raising his right arm, he was having difficulty raising his right leg.  Significant other called EMS and patient was brought to the hospital for evaluation.  Currently patient is severely dysarthric, understands he has had a stroke however, is focused on getting home within a day.   ED course  Relevant labs include - WBC 21.3 BUN 24 ALT 46  CT head shows-multifocal areas of hypoattenuation scattered throughout the bilateral supratentorial brain and right cerebellum.  There may be mild associated vasogenic edema with a high right parietal lesion.  MRI brain-numerous small supratentorial and infratentorial acute infarcts involving the cerebral hemispheres, left thalamus, pons and right cerebellar hemisphere.  This exam suggestive of embolic process.  Chart review (no prior neurology notes)  LKW: Prior to bed on 4/12 tpa given?: no, out of window Premorbid modified Rankin scale (mRS): 0 NIH stroke scale of 5   Past Medical History:  Diagnosis Date  . Tobacco abuse     Family History  Problem Relation Age of Onset  . Hypertension Mother   . Hypertension Father    Social History:   reports that he has been smoking cigarettes. He has  been smoking about 4.00 packs per day. He does not have any smokeless tobacco history on file. No history on file for alcohol and drug.  Medications  Current Facility-Administered Medications:  .  0.9 %  sodium chloride infusion, , Intravenous, Continuous, Dana Allan, MD .  Melene Muller ON 06/03/2019] aspirin tablet 325 mg, 325 mg, Oral, Daily, Dana Allan, MD .  atorvastatin (LIPITOR) tablet 80 mg, 80 mg, Oral, Daily, Dana Allan, MD .  clopidogrel (PLAVIX) tablet 300 mg, 300 mg, Oral, Once, Rejeana Brock, MD .  clopidogrel (PLAVIX) tablet 75 mg, 75 mg, Oral, Daily, Dana Allan, MD .  enoxaparin (LOVENOX) injection 40 mg, 40 mg, Subcutaneous, Q24H, Dana Allan, MD .  nicotine (NICODERM CQ - dosed in mg/24 hours) patch 21 mg, 21 mg, Transdermal, Daily, Dana Allan, MD, 21 mg at 06/02/19 1955  Current Outpatient Medications:  .  gabapentin (NEURONTIN) 600 MG tablet, Take 600 mg by mouth 3 (three) times daily., Disp: , Rfl:  .  pantoprazole (PROTONIX) 40 MG tablet, Take 40 mg by mouth daily., Disp: , Rfl:  .  predniSONE (DELTASONE) 10 MG tablet, Take 10 mg by mouth See admin instructions. Day 5-8 Take one tablet four times daily Day 9-12 Take one tablet twice daily, Disp: , Rfl:   ROS:    General ROS: negative for - chills, fatigue, fever, night sweats, weight gain or weight loss Psychological ROS: negative for - behavioral disorder, hallucinations, memory difficulties, mood swings or suicidal ideation Ophthalmic ROS: negative for - blurry vision, double vision, eye  pain or loss of vision ENT ROS: negative for - epistaxis, nasal discharge, oral lesions, sore throat, tinnitus or vertigo Respiratory ROS: negative for - cough, hemoptysis, shortness of breath or wheezing Cardiovascular ROS: negative for - chest pain, dyspnea on exertion, edema or irregular heartbeat Gastrointestinal ROS: negative for - abdominal pain, diarrhea, hematemesis, nausea/vomiting or stool  incontinence Genito-Urinary ROS: negative for - dysuria, hematuria, incontinence or urinary frequency/urgency Musculoskeletal ROS: Positive for -  muscular weakness Neurological ROS: as noted in HPI Dermatological ROS: Positive for scratches on left forearm secondary to his dog teething  Exam: Current vital signs: BP 95/71   Pulse 65   Temp 98.4 F (36.9 C) (Oral)   Resp 17   Ht 5\' 7"  (1.702 m)   Wt 68 kg   SpO2 96%   BMI 23.49 kg/m  Vital signs in last 24 hours: Temp:  [98.2 F (36.8 C)-98.4 F (36.9 C)] 98.4 F (36.9 C) (04/14 1139) Pulse Rate:  [53-101] 65 (04/14 1930) Resp:  [12-19] 17 (04/14 1930) BP: (63-160)/(44-120) 95/71 (04/14 1930) SpO2:  [92 %-99 %] 96 % (04/14 1930) Weight:  [68 kg] 68 kg (04/14 1137)   Constitutional: Appears well-developed and well-nourished.  Eyes: No scleral injection, unequal pupils HENT: No OP obstrucion Head: Normocephalic.  Cardiovascular: Normal rate and regular rhythm.  Respiratory: Effort normal, non-labored breathing GI: Soft.  No distension. There is no tenderness.  Skin: WDI  Neuro: Mental Status: Patient is awake, alert, oriented to person, place, month, year, and situation. Speech-severely dysarthric but not aphasic.  He is able to follow commands.  He is able to name and repeat.  Patient is able to give a comprehensible history however the majority history was gained from the wife as he is severely dysarthric. Cranial Nerves: II: Visual Fields are full.  III,IV, VI: EOMI with saccadic pursuit but without ptosis or diploplia. Pupils unequal with the right being 2 mm and the left being 4 mm, round and reactive to light V: Facial sensation is symmetric to temperature VII: Facial movement with right weakness VIII: hearing is intact to voice X: Palat elevates symmetrically XI: Shoulder shrug is symmetric. XII: tongue is midline without atrophy or fasciculations.  Motor: Normal tone and bulk -Unable to lift right arm off  bed giving him 2/5 proximally with distally 4/5.  4/5 left upper extremity.  Left lower extremity 5/5 and right lower extremity 4/5, it takes a great deal of encouragement in his right leg due to back pain.  With repeated encouragement, he is able to hold his right leg about an inch off the bed with only mild drift. Sensory: Sensation is symmetric to light touch and temperature in the arms and legs. DSS Deep Tendon Reflexes: 3+ and symmetric in the biceps and patellae.  Plantars: Upgoing toes bilaterally Cerebellar: Finger-to-nose showed no dysmetria on the left, unable to do on the right. HKS with difficulty on the left, refuses on the right.   Labs I have reviewed labs in epic and the results pertinent to this consultation are:   CBC    Component Value Date/Time   WBC 21.3 (H) 06/02/2019 1058   RBC 5.62 06/02/2019 1058   HGB 17.0 06/02/2019 1058   HCT 50.6 06/02/2019 1058   PLT 293 06/02/2019 1058   MCV 90.0 06/02/2019 1058   MCH 30.2 06/02/2019 1058   MCHC 33.6 06/02/2019 1058   RDW 14.6 06/02/2019 1058   LYMPHSABS 6.0 (H) 06/02/2019 1058   MONOABS 1.3 (H) 06/02/2019 1058  EOSABS 0.2 06/02/2019 1058   BASOSABS 0.0 06/02/2019 1058    CMP     Component Value Date/Time   NA 140 06/02/2019 1058   K 3.6 06/02/2019 1058   CL 102 06/02/2019 1058   CO2 24 06/02/2019 1058   GLUCOSE 111 (H) 06/02/2019 1058   BUN 24 (H) 06/02/2019 1058   CREATININE 1.03 06/02/2019 1058   CALCIUM 9.2 06/02/2019 1058   PROT 7.1 06/02/2019 1058   ALBUMIN 3.7 06/02/2019 1058   AST 25 06/02/2019 1058   ALT 46 (H) 06/02/2019 1058   ALKPHOS 92 06/02/2019 1058   BILITOT 0.9 06/02/2019 1058   GFRNONAA >60 06/02/2019 1058   GFRAA >60 06/02/2019 1058    Lipid Panel     Component Value Date/Time   CHOL 221 (H) 06/02/2019 1830   TRIG 414 (H) 06/02/2019 1830   HDL 40 (L) 06/02/2019 1830   CHOLHDL 5.5 06/02/2019 1830   VLDL UNABLE TO CALCULATE IF TRIGLYCERIDE OVER 400 mg/dL 06/02/2019 1830    LDLCALC UNABLE TO CALCULATE IF TRIGLYCERIDE OVER 400 mg/dL 06/02/2019 1830   LDLDIRECT 121.3 (H) 06/02/2019 1830     Imaging I have reviewed the images obtained:  CT head shows-multifocal areas of hypoattenuation scattered throughout the bilateral supratentorial brain and right cerebellum.  There may be mild associated vasogenic edema with a high right parietal lesion.  MRI brain-numerous small supratentorial and infratentorial acute infarcts involving the cerebral hemispheres, left thalamus, pons and right cerebellar hemisphere.  This exam suggestive of embolic process.  Etta Quill PA-C [facility] [phone]  M-F  (9:00 am- 5:00 PM)  06/02/2019, 8:00 PM   NIHSS Facial palsy: 1 Right arm weakness: 3 Right leg weakness: 1(needs repeated prompting due to pain) Ataxia 1 Dysarthria: 2 Total: 7  I have seen the patient and reviewed the note.   Assessment:  This is a 54 year old gentleman with sudden onset of dizziness followed by dysarthria and right-sided weakness.  MRI confirms both supratentorial and infratentorial bilateral scattered infarcts.  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   He had some symptoms yesterday morning, however they got markedly worse about 10:30 AM.  Since that time he has not had any progression of his symptoms.  Though the majority of his strokes are attributable to his basilar occlusion, there does appear to be at least 2 in the anterior circulation, which would raise the possibility of embolic events.  He is very resistant to being admitted, insisting that he will leave tomorrow.  He indicated several times that he wanted to leave AMA currently, but after discussing with him the severity of his situation, he agrees to stay at least 1 night.  I suspect that his bulbar dysfunction is going to be the primary disabling factor  here.  Recommend -I will transfer to the ICU under my service. -Transthoracic Echo, may need TEE and loop monitor  -Start patient on ASA 325mg  daily, Plavix 75 mg daily x 3 weeks, including 300 mg load will need NG tube for this. -Start Atorvastatin 80 mg/other high intensity statin -BP goal: permissive HTN upto 220/120 mmHg, I would favor maintaining his systolic blood pressure at least 120, given that he has been persistently hypotensive I have consulted critical care for assistance with pressure support. -HBAIC and Lipid profile -We will need a nicotine patch as he smokes 4 packs of cigarettes a day -Telemetry monitoring -Frequent neuro  checks -NPO until passes stroke swallow screen -PT/OT # please page stroke NP  Or  PA  Or MD from 8am -4 pm  as this patient from this time will be  followed by the stroke.   You can look them up on [email or web address]  Password [email or web address]  Ritta Slot, MD [facility] [phone]  If 7pm- 7am, please page neurology on call as listed in AMION.

## 2019-06-02 NOTE — Progress Notes (Signed)
EEG complete - results pending 

## 2019-06-02 NOTE — ED Notes (Signed)
Pt. Transported to MRI 

## 2019-06-02 NOTE — Consult Note (Signed)
NAME:  Adrian [name], MRN:  [id number], DOB:  May 19, 1965, LOS: 0 ADMISSION DATE:  06/02/2019, CONSULTATION DATE:  06/02/2019 REFERRING MD:  Dr. Amada Jupiter, CHIEF COMPLAINT:  Weakness  Brief History   54 year old male history of heavy tobacco abuse and back pain on prolonged steroid tapers presenting with progressive right-sided weakness, dysarthria, and headache since 4/13 morning found to have both supratentorial and infratentorial bilateral scattered infarcts and basilar occlusion.  Hypotensive with SBP in the 80's despite 3L NS.  Neurology admitting, PCCM consulted for vasopressor support management for SBP of at least 120.  History of present illness   HPI obtained from spouse at bedside as patient is severely dysarthric.  54 year old male with history of tobacco abuse back pain presenting with strokelike symptoms.  Works as an Teacher, English as a foreign language.  Of note he has had increased back pain over the recent months and been on several long tapers of prednisone ( 28 day taper) taking Neurontin as he is unable to take NSAIDs.  Spouse states that he normally smokes 1-1/2 pack/day but given increased pain has been smoking 4 to 5 packs of cigarettes a day.  Other than this, patient was at his baseline.  Denies alcohol use.  No recent fevers or chills.  Has chronic productive cough.  Patient was at work yesterday, 06/02/2019, and was found by coworkers laying in the yard.  Patient had complained of dizziness and episodes of vomiting, possibly 2.  They carried him home however he refused to go to the hospital for evaluation.  Symptoms of dysarthria, headache, and right-sided weakness progressed.  Per spouse, symptoms today have been stable but given still present, he presented 4/14 for evaluation.  Last known well 4/13 am, possibly 4/12 prior to going to bed.  From what can be understood, patient wants to leave AMA but agreed with Neurology to stay the night.   ER workup noted for WBC 21.3, glucose 111, BUN 24, ALT 46,  HDL 40, LDL unable to calculate, cholesterol 221, HA1c 5.8, normal coags.  CT head showed multifocal areas of hypoattenuation scattered throughout the bilateral supratentorial brain and right cerebellum with mild associated vasogenic edema.  MRI confirming both supratentorial and infratentorial bilateral scattered infarcts. Found on CTA to have basilar occlusion.  He has been afebrile, on room air with saturations 93-99%, normal work of breathing, and blood pressures consistently with systolic in the 80's despite 3 L NS bolus.  Neurology to admit, however wants systolic blood pressure of at least 120 for perfusion, therefore PCCM consulted for vasopressor support.   Past Medical History  Tobacco abuse, back pain  Significant Hospital Events   4/13 symptom onset 4/14 admitted  Consults:  PCCM  Procedures:   Significant Diagnostic Tests:  4/14 [facility] >> Multifocal areas of hypoattenuation scattered throughout the bilateral supratentorial brain and right cerebellum. There may be mild associated vasogenic edema with a high right parietal lesion. Findings are suspicious for multifocal brain metastases. Further evaluation with contrast-enhanced MRI of the brain is recommended.  4/14 CTA head >> 1. Occlusive thrombus within the distal V4 right vertebral artery with occlusive thrombus extending into the proximal and mid basilar artery. Occlusive thrombus is also suspected within the very distal V4 left vertebral artery. There is reconstitution of flow within the distal basilar artery. 2. Only intermittent and irregular enhancement of the right PICA is seen and there is suspected multifocal thrombus within this vessel. 3. The bilateral AICAs are poorly delineated. 4. The posterior cerebral arteries are patent proximally without  significant stenosis. However, emboli are suspected within the distal PCAs bilaterally given the pattern of acute infarcts demonstrated on same-day brain MRI. 5. No  anterior circulation large vessel occlusion or proximal high-grade stenosis is identified.  4/14 CTA neck  >> 1. Eccentric endoluminal thrombus within the aortic arch as described. 2. 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 3. The bilateral common and internal carotid arteries are patent within the neck without significant stenosis. Eccentric hypodensity within the mid left common carotid artery has an appearance most suggestive of soft plaque. Eccentric thrombus at this site cannot be definitively excluded. 4. The vertebral arteries are patent within the neck bilaterally. Soft plaque at both vertebral artery origins with mild ostial stenosis on the left.  4/14 MRI brain >> The patient was unable to tolerate the full examination. As a result, postcontrast imaging could not be obtained. Numerous small supratentorial and infratentorial acute infarcts involving the cerebral hemispheres, left thalamus, pons and right cerebellar hemisphere. Given involvement of multiple vascular territories, findings are suggestive of an embolic process. Chronic right frontoparietal cortical infarcts. Background mild generalized parenchymal atrophy and chronic small vessel ischemic disease.  4/14 MRI cervical >> Motion degraded study. There is no high-grade canal stenosis. Probable foraminal stenosis, which is difficult to evaluate in the setting of artifact.  4/15 TTE >>  Micro Data:  4/14 SARS 2 >>   Antimicrobials:  n/a  Interim history/subjective:   Objective   Blood pressure 96/72, pulse 63, temperature 98.4 F (36.9 C), temperature source Oral, resp. rate 16, height 5\' 7"  (1.702 m), weight 68 kg, SpO2 97 %.        Intake/Output Summary (Last 24 hours) at 06/02/2019 2029 Last data filed at 06/02/2019 1210 Gross per 24 hour  Intake 1000 ml  Output --  Net 1000 ml   Filed Weights   06/02/19 1137  Weight: 68 kg    Examination: General:  Older adult  male lying in bed in NAD HEENT: MM pink/moist, poor dentition, pupils 4/reactive, anicteric, handing secretions   Neuro: alert, severely dysarthric-at times very frustrated trying to communicate, follows commands, LUE 5/5, RUE seems rigid in a flexed position, unable to hold to gravity, RLE 2/5, LLE 5/5 CV: rr, no murmur PULM:  Non labored, slightly coarse on right, otherwise clear and diminished, no wheeze GI: soft, ND, bs active  Extremities: warm/dry, no edema  Skin: scratches and abrasions to bilateral arms (due to puppy at home)  Resolved Hospital Problem list    Assessment & Plan:   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 - most of his strokes thought attributed to his basilar occlusion, however, also questionable occlusions in the anterior circulation, raising concern for embolic event P:  Per Neurology who is admitting to NICU Serial neuro exams  TTE in am - may need TEE for embolic workup NGT to be placed for ASA, plavix and will need statin  SBP goal at least 120 NPO till SLP PT/ OT   Hypotension  - despite 3L NS - has been on several long prednisone tapers (28 days) for his back pain, raising the concern for adrenal insufficieny  P:  Peripheral levophed for now for SBP goal of at least 120 Checking cortisol then starting solucortef    Leukocytosis  - likely related to steroid use, but given vomiting hx, will need to rule out aspiration event - UA neg, afebrile P:  Check PCT, CXR  Monitor clinically for now Trend WBC/  fever curve    Tobacco abuse  P:  Nicotine patch  Cessation counseling when appropriate   Best practice:  Diet: NPO Pain/Anxiety/Delirium protocol (if indicated): n/a, if needed for back pain, consider low dose fentanyl  VAP protocol (if indicated): n/a DVT prophylaxis: lovenox GI prophylaxis: PPI Glucose control: trend on BMET Mobility: PT/OT Code Status: full  Family Communication:  spouse, [name] updated at bedside [phone] Disposition: Neuro ICU   Labs   CBC: Recent Labs  Lab 06/02/19 1058  WBC 21.3*  NEUTROABS 13.8*  HGB 17.0  HCT 50.6  MCV 90.0  PLT 293    Basic Metabolic Panel: Recent Labs  Lab 06/02/19 1058  NA 140  K 3.6  CL 102  CO2 24  GLUCOSE 111*  BUN 24*  CREATININE 1.03  CALCIUM 9.2   GFR: Estimated Creatinine Clearance: 76.7 mL/min (by C-G formula based on SCr of 1.03 mg/dL). Recent Labs  Lab 06/02/19 1058  WBC 21.3*    Liver Function Tests: Recent Labs  Lab 06/02/19 1058  AST 25  ALT 46*  ALKPHOS 92  BILITOT 0.9  PROT 7.1  ALBUMIN 3.7   No results for input(s): LIPASE, AMYLASE in the last 168 hours. No results for input(s): AMMONIA in the last 168 hours.  ABG No results found for: PHART, PCO2ART, PO2ART, HCO3, TCO2, ACIDBASEDEF, O2SAT   Coagulation Profile: Recent Labs  Lab 06/02/19 1058  INR 0.9    Cardiac Enzymes: Recent Labs  Lab 06/02/19 1830  CKTOTAL 118    HbA1C: Hgb A1c MFr Bld  Date/Time Value Ref Range Status  06/02/2019 06:30 PM 5.8 (H) 4.8 - 5.6 % Final    Comment:    (NOTE) Pre diabetes:          5.7%-6.4% Diabetes:              >6.4% Glycemic control for   <7.0% adults with diabetes     CBG: Recent Labs  Lab 06/02/19 1034 06/02/19 1045  GLUCAP 105* 108*    Review of Systems:   Review of Systems  Constitutional: Negative for chills and fever.  Respiratory: Negative for shortness of breath and wheezing.        Chronic cough, unchanged  Cardiovascular: Negative for chest pain and leg swelling.  Gastrointestinal: Positive for vomiting. Negative for abdominal pain.  Musculoskeletal: Positive for back pain.  Neurological: Positive for dizziness, speech change and focal weakness.   Limited given patients frustration trying to communicate given his dysarthria.    Past Medical History  He,  has a past medical history of Tobacco abuse.   Surgical History   History  reviewed. No pertinent surgical history.   Social History   reports that he has been smoking cigarettes. He has been smoking about 4.00 packs per day. He does not have any smokeless tobacco history on file.   Family History   His family history includes Hypertension in his father and mother.   Allergies Allergies  Allergen Reactions  . Codeine Other (See Comments)    unknown  . Motrin [Ibuprofen] Other (See Comments)    unknown  . Penicillins Other (See Comments)    unknown     Home Medications  Prior to Admission medications   Medication Sig Start Date End Date Taking? Authorizing Provider  gabapentin (NEURONTIN) 600 MG tablet Take 600 mg by mouth 3 (three) times daily.   Yes [provider]  pantoprazole (PROTONIX) 40 MG tablet Take 40 mg by mouth daily. 03/27/19  Yes [provider]  predniSONE (DELTASONE) 10 MG tablet Take 10 mg by mouth See admin instructions. Day 5-8 Take one tablet four times daily Day 9-12 Take one tablet twice daily   Yes [provider]     Critical care time: 10 mins     Kennieth Rad, MSN, AGACNP-BC [facility] 06/02/2019, 9:41 PM

## 2019-06-02 NOTE — Consult Note (Signed)
Neurology Consultation  Reason for Consult: Stroke Referring Physician: Dr. Melina Copa  CC: Dysarthria/weakness  History is obtained from: Patient and chart  HPI: Adrian [name] is a 54 y.o. male with history of tobacco abuse.  Patient came to the ED with strokelike symptoms.  MRI of brain was obtained and confirmed multiple small punctate strokes in both hemispheres.  Neurology was asked to consult for strokes.    Patient was working outside yesterday when he noted he felt dizzy and laid down on the grass at approximately 1030.  At 23 his coworkers found him.  And brought him to one of the coworkers house.  He states that at that time he suddenly really felt the effects of his strokes.  His speech got significantly slurred, he was having difficulty raising his right arm, he was having difficulty raising his right leg.  Significant other called EMS and patient was brought to the hospital for evaluation.  Currently patient is severely dysarthric, understands he has had a stroke however, is focused on getting home within a day.   ED course  Relevant labs include - WBC 21.3 BUN 24 ALT 46  CT head shows-multifocal areas of hypoattenuation scattered throughout the bilateral supratentorial brain and right cerebellum.  There may be mild associated vasogenic edema with a high right parietal lesion.  MRI brain-numerous small supratentorial and infratentorial acute infarcts involving the cerebral hemispheres, left thalamus, pons and right cerebellar hemisphere.  This exam suggestive of embolic process.  Chart review (no prior neurology notes)  LKW: 10:30 AM on 06/01/2019 tpa given?: no, out of window Premorbid modified Rankin scale (mRS): 0 NIH stroke scale of 5   Past Medical History:  Diagnosis Date  . Tobacco abuse     Family History  Problem Relation Age of Onset  . Hypertension Mother   . Hypertension Father    Social History:   reports that he has been smoking cigarettes. He has  been smoking about 4.00 packs per day. He does not have any smokeless tobacco history on file. No history on file for alcohol and drug.  Medications  Current Facility-Administered Medications:  .  aspirin chewable tablet 324 mg, 324 mg, Oral, Once, Virgel Manifold, MD .  sodium chloride 0.9 % bolus 1,000 mL, 1,000 mL, Intravenous, Once, Virgel Manifold, MD  Current Outpatient Medications:  .  gabapentin (NEURONTIN) 600 MG tablet, Take 600 mg by mouth 3 (three) times daily., Disp: , Rfl:  .  pantoprazole (PROTONIX) 40 MG tablet, Take 40 mg by mouth daily., Disp: , Rfl:  .  predniSONE (DELTASONE) 10 MG tablet, Take 10 mg by mouth See admin instructions. Day 5-8 Take one tablet four times daily Day 9-12 Take one tablet twice daily, Disp: , Rfl:   ROS:    General ROS: negative for - chills, fatigue, fever, night sweats, weight gain or weight loss Psychological ROS: negative for - behavioral disorder, hallucinations, memory difficulties, mood swings or suicidal ideation Ophthalmic ROS: negative for - blurry vision, double vision, eye pain or loss of vision ENT ROS: negative for - epistaxis, nasal discharge, oral lesions, sore throat, tinnitus or vertigo Respiratory ROS: negative for - cough, hemoptysis, shortness of breath or wheezing Cardiovascular ROS: negative for - chest pain, dyspnea on exertion, edema or irregular heartbeat Gastrointestinal ROS: negative for - abdominal pain, diarrhea, hematemesis, nausea/vomiting or stool incontinence Genito-Urinary ROS: negative for - dysuria, hematuria, incontinence or urinary frequency/urgency Musculoskeletal ROS: Positive for -  muscular weakness Neurological ROS: as noted in  HPI Dermatological ROS: Positive for scratches on left forearm secondary to his dog teething  Exam: Current vital signs: BP (!) 89/72   Pulse 70   Temp 98.4 F (36.9 C) (Oral)   Resp 19   Ht 5\' 7"  (1.702 m)   Wt 68 kg   SpO2 96%   BMI 23.49 kg/m  Vital signs in last 24  hours: Temp:  [98.2 F (36.8 C)-98.4 F (36.9 C)] 98.4 F (36.9 C) (04/14 1139) Pulse Rate:  [53-76] 70 (04/14 1415) Resp:  [12-19] 19 (04/14 1415) BP: (63-160)/(44-120) 89/72 (04/14 1415) SpO2:  [92 %-98 %] 96 % (04/14 1415) Weight:  [68 kg] 68 kg (04/14 1137)   Constitutional: Appears well-developed and well-nourished.  Eyes: No scleral injection, unequal pupils HENT: No OP obstrucion Head: Normocephalic.  Cardiovascular: Normal rate and regular rhythm.  Respiratory: Effort normal, non-labored breathing GI: Soft.  No distension. There is no tenderness.  Skin: WDI  Neuro: Mental Status: Patient is awake, alert, oriented to person, place, month, year, and situation. Speech-severely dysarthric but not aphasic.  He is able to follow commands.  He is able to name and repeat.  Patient is able to give a comprehensible history however the majority history was gained from the wife as he is severely dysarthric. Cranial Nerves: II: Visual Fields are full.  III,IV, VI: EOMI with saccadic pursuit but without ptosis or diploplia. Pupils unequal with the right being 2 mm and the left being 4 mm, round and reactive to light V: Facial sensation is symmetric to temperature VII: Facial movement is symmetric.  VIII: hearing is intact to voice X: Palat elevates symmetrically XI: Shoulder shrug is symmetric. XII: tongue is midline without atrophy or fasciculations.  Motor: Normal tone and bulk -Unable to lift right arm off bed giving him 2/5 proximally with distally 4/5.  4/5 left upper extremity.  Left lower extremity 5/5 and right lower extremity 4/5. Sensory: Sensation is symmetric to light touch and temperature in the arms and legs. DSS Deep Tendon Reflexes: 3+ and symmetric in the biceps and patellae.  Plantars: Upgoing toes bilaterally Cerebellar: Finger-to-nose showed no dysmetria on the left, unable to do on the right.  Labs I have reviewed labs in epic and the results pertinent  to this consultation are:   CBC    Component Value Date/Time   WBC 21.3 (H) 06/02/2019 1058   RBC 5.62 06/02/2019 1058   HGB 17.0 06/02/2019 1058   HCT 50.6 06/02/2019 1058   PLT 293 06/02/2019 1058   MCV 90.0 06/02/2019 1058   MCH 30.2 06/02/2019 1058   MCHC 33.6 06/02/2019 1058   RDW 14.6 06/02/2019 1058   LYMPHSABS 6.0 (H) 06/02/2019 1058   MONOABS 1.3 (H) 06/02/2019 1058   EOSABS 0.2 06/02/2019 1058   BASOSABS 0.0 06/02/2019 1058    CMP     Component Value Date/Time   NA 140 06/02/2019 1058   K 3.6 06/02/2019 1058   CL 102 06/02/2019 1058   CO2 24 06/02/2019 1058   GLUCOSE 111 (H) 06/02/2019 1058   BUN 24 (H) 06/02/2019 1058   CREATININE 1.03 06/02/2019 1058   CALCIUM 9.2 06/02/2019 1058   PROT 7.1 06/02/2019 1058   ALBUMIN 3.7 06/02/2019 1058   AST 25 06/02/2019 1058   ALT 46 (H) 06/02/2019 1058   ALKPHOS 92 06/02/2019 1058   BILITOT 0.9 06/02/2019 1058   GFRNONAA >60 06/02/2019 1058   GFRAA >60 06/02/2019 1058    Lipid Panel  No results  found for: CHOL, TRIG, HDL, CHOLHDL, VLDL, LDLCALC, LDLDIRECT   Imaging I have reviewed the images obtained:  CT head shows-multifocal areas of hypoattenuation scattered throughout the bilateral supratentorial brain and right cerebellum.  There may be mild associated vasogenic edema with a high right parietal lesion.  MRI brain-numerous small supratentorial and infratentorial acute infarcts involving the cerebral hemispheres, left thalamus, pons and right cerebellar hemisphere.  This exam suggestive of embolic process.  Felicie Morn PA-C [facility] [phone]  M-F  (9:00 am- 5:00 PM)  06/02/2019, 4:25 PM     Assessment:  This is a 54 year old gentleman with sudden onset of dizziness followed by dysarthria and right-sided weakness.  MRI confirms both supratentorial and infratentorial bilateral scattered infarcts.  Most likely cardioembolic.  Patient will need to be admitted for further evaluation of  stroke etiology and stroke risk factors.  Impression: -Bilateral cerebral infarcts most likely cardioembolic  Recommend -CTA of head and neck -MRI of cervical spine -Transthoracic Echo, may need TEE and loop monitor  -Start patient on ASA 325mg  daily, Plavix 75 mg daily x 3 weeks -after passes swallow screen -Start Atorvastatin 80 mg/other high intensity statin -BP goal: permissive HTN upto 220/120 mmHg -HBAIC and Lipid profile -We will need a nicotine patch as he smokes 4 packs of cigarettes a day -Telemetry monitoring -Frequent neuro checks -NPO until passes stroke swallow screen -PT/OT # please page stroke NP  Or  PA  Or MD from 8am -4 pm  as this patient from this time will be  followed by the stroke.   You can look them up on [email or web address]  Password [email or web address]

## 2019-06-02 NOTE — ED Triage Notes (Signed)
Pt accompanied by wife who reports LSN 11am yesterday, pt was found on the floor by his coworkers. Pt presents today with right arm weakness, slurred speech and unequal pupils. Pt alert.

## 2019-06-03 ENCOUNTER — Inpatient Hospital Stay (HOSPITAL_COMMUNITY): Payer: Medicaid Other

## 2019-06-03 ENCOUNTER — Observation Stay (HOSPITAL_COMMUNITY): Payer: Medicaid Other

## 2019-06-03 DIAGNOSIS — I6389 Other cerebral infarction: Secondary | ICD-10-CM

## 2019-06-03 LAB — COMPREHENSIVE METABOLIC PANEL
ALT: 39 U/L (ref 0–44)
AST: 20 U/L (ref 15–41)
Albumin: 3.1 g/dL — ABNORMAL LOW (ref 3.5–5.0)
Alkaline Phosphatase: 80 U/L (ref 38–126)
Anion gap: 14 (ref 5–15)
BUN: 14 mg/dL (ref 6–20)
CO2: 19 mmol/L — ABNORMAL LOW (ref 22–32)
Calcium: 8.5 mg/dL — ABNORMAL LOW (ref 8.9–10.3)
Chloride: 104 mmol/L (ref 98–111)
Creatinine, Ser: 0.86 mg/dL (ref 0.61–1.24)
GFR calc Af Amer: >60 mL/min (ref >60)
GFR calc non Af Amer: >60 mL/min (ref >60)
Glucose, Bld: 107 mg/dL — ABNORMAL HIGH (ref 70–99)
Potassium: 3.3 mmol/L — ABNORMAL LOW (ref 3.5–5.1)
Sodium: 137 mmol/L (ref 135–145)
Total Bilirubin: 1.6 mg/dL — ABNORMAL HIGH (ref 0.3–1.2)
Total Protein: 6.3 g/dL — ABNORMAL LOW (ref 6.5–8.1)

## 2019-06-03 LAB — CBC
HCT: 44.6 % (ref 39.0–52.0)
Hemoglobin: 15.5 g/dL (ref 13.0–17.0)
MCH: 30.5 pg (ref 26.0–34.0)
MCHC: 34.8 g/dL (ref 30.0–36.0)
MCV: 87.6 fL (ref 80.0–100.0)
Platelets: 254 10*3/uL (ref 150–400)
RBC: 5.09 MIL/uL (ref 4.22–5.81)
RDW: 14.1 % (ref 11.5–15.5)
WBC: 23.5 10*3/uL — ABNORMAL HIGH (ref 4.0–10.5)
nRBC: 0 % (ref 0.0–0.2)

## 2019-06-03 LAB — ECHOCARDIOGRAM COMPLETE
Height: 67 in
Weight: 2507.95 oz

## 2019-06-03 LAB — MRSA PCR SCREENING: MRSA by PCR: NEGATIVE

## 2019-06-03 IMAGING — CT CT HEAD W/O CM
3 series · 14 of 47 positions shown, 16 images · non-contrast
Comparison: Brain MRI [DATE]

CLINICAL DATA: Stroke follow-up

EXAM:
CT HEAD WITHOUT CONTRAST
TECHNIQUE: Contiguous axial images were obtained from the base of the skull
through the vertex without intravenous contrast.

[Series 3: head 5.0 h30s · axial · 0.43mm/px · z∈[-65,+65]mm · 8 of 32 slices shown, 10 images]
[im 3/32  brain]
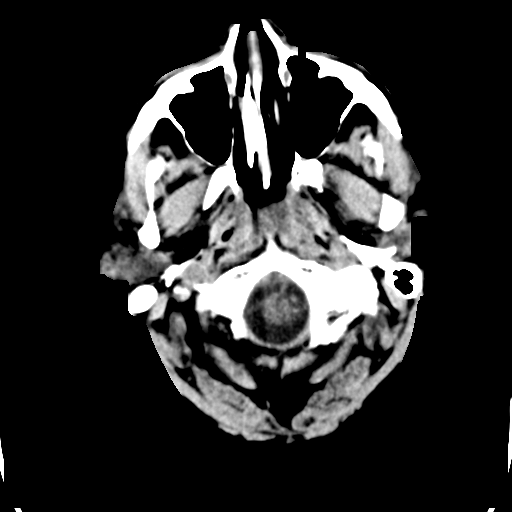
[im 3/32  bone]
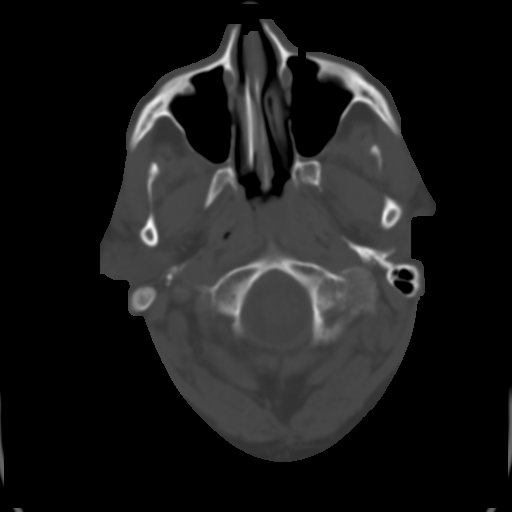
[im 7/32  brain]
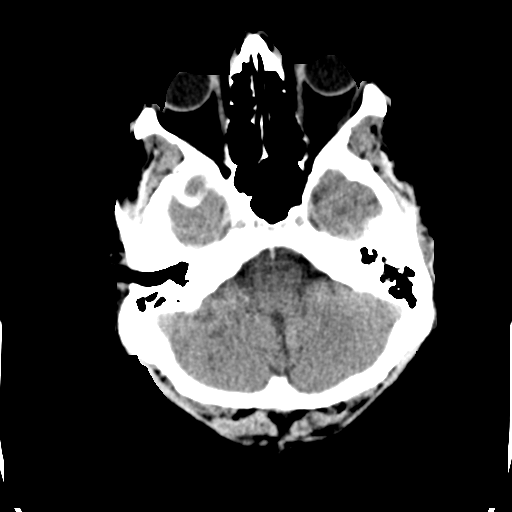
[im 10/32  brain]
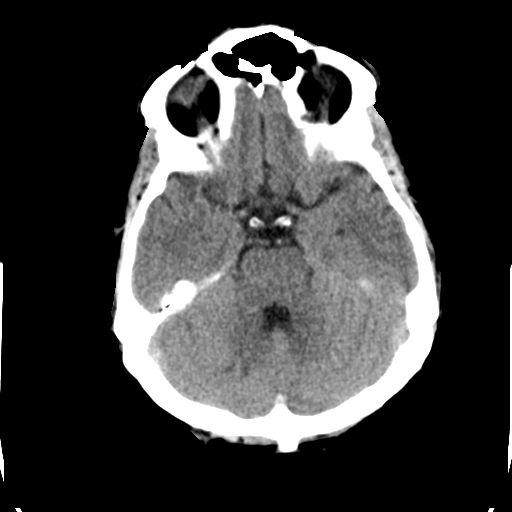
[im 14/32  brain]
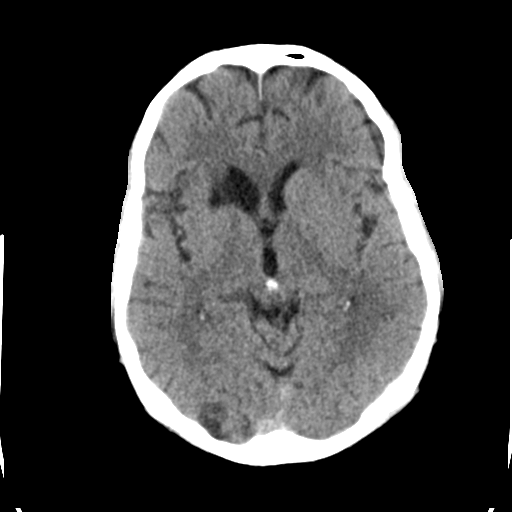
[im 18/32  brain]
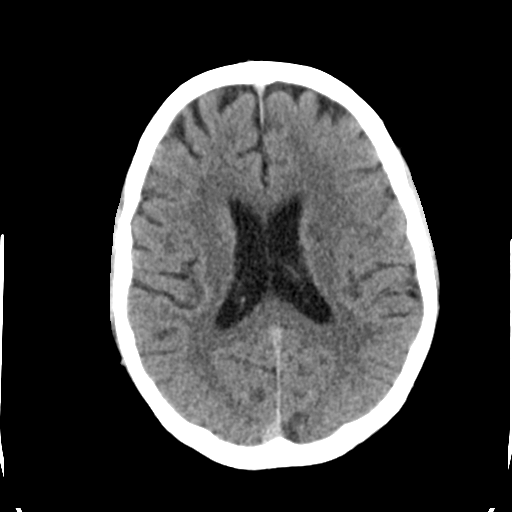
[im 18/32  bone]
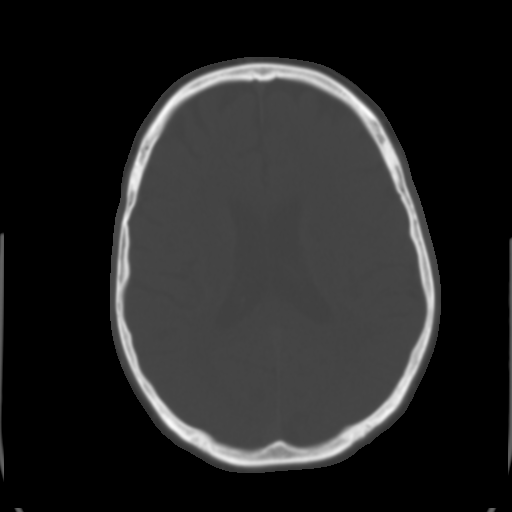
[im 22/32  brain]
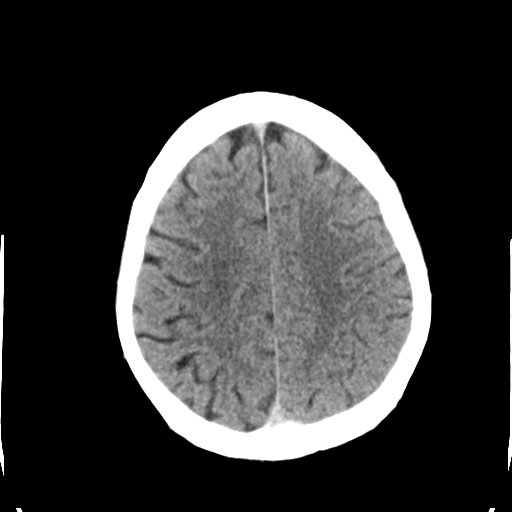
[im 25/32  brain]
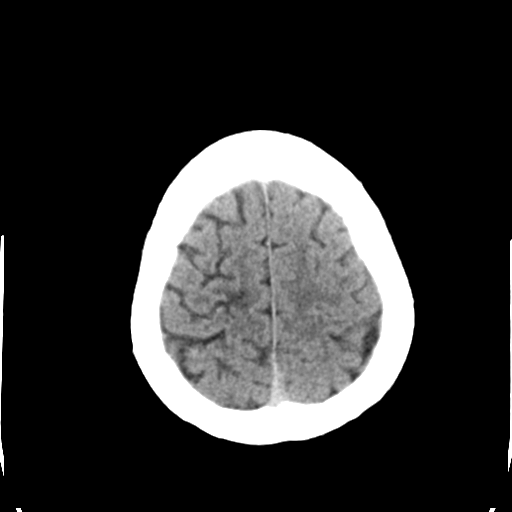
[im 29/32  brain]
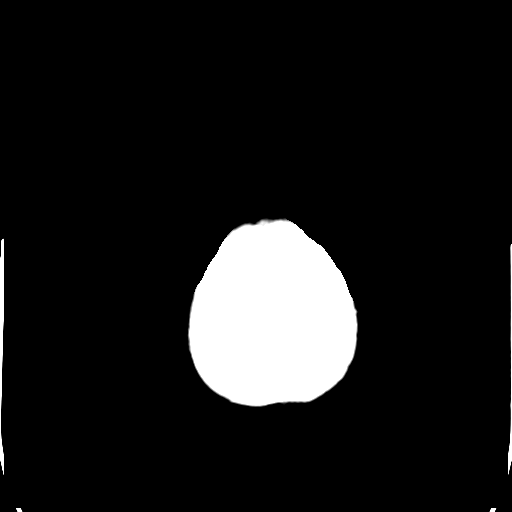

[Series 5: head 3.0 mpr cor · coronal · 0.37mm/px · 3 of 74 slices shown]
[im 25/74  brain]
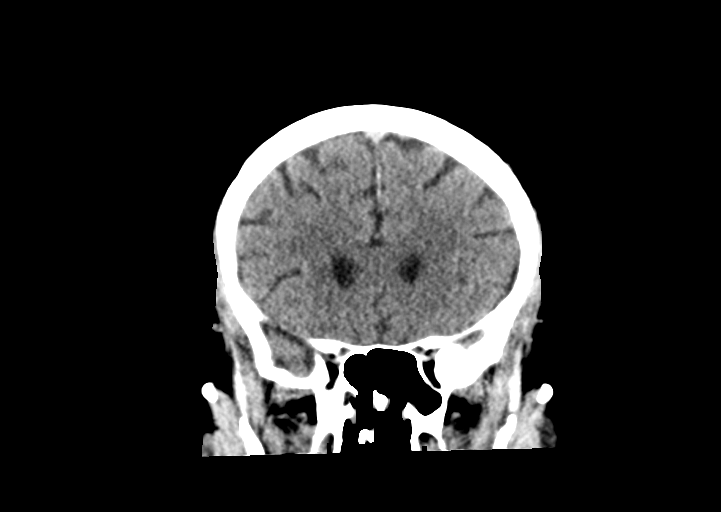
[im 33/74  brain]
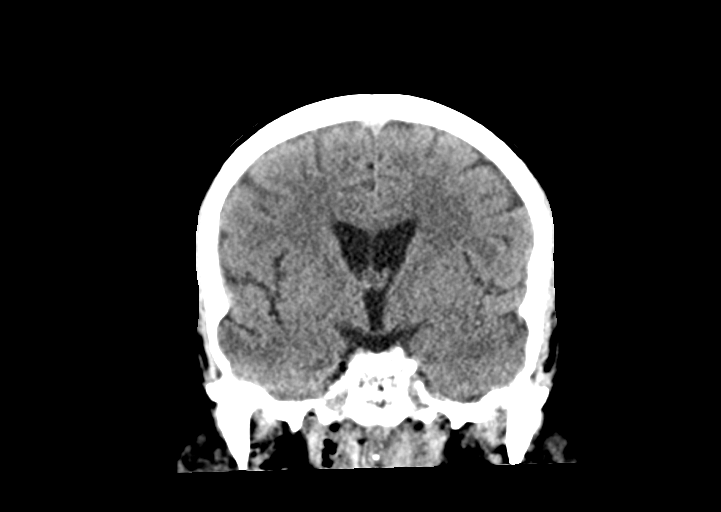
[im 41/74  brain]
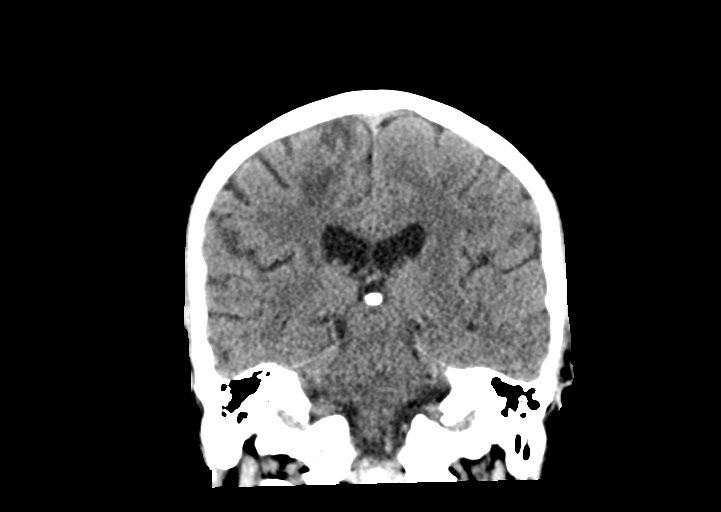

[Series 6: head 3.0 mpr sag · sagittal · 0.38mm/px · 3 of 67 slices shown]
[im 23/67  brain]
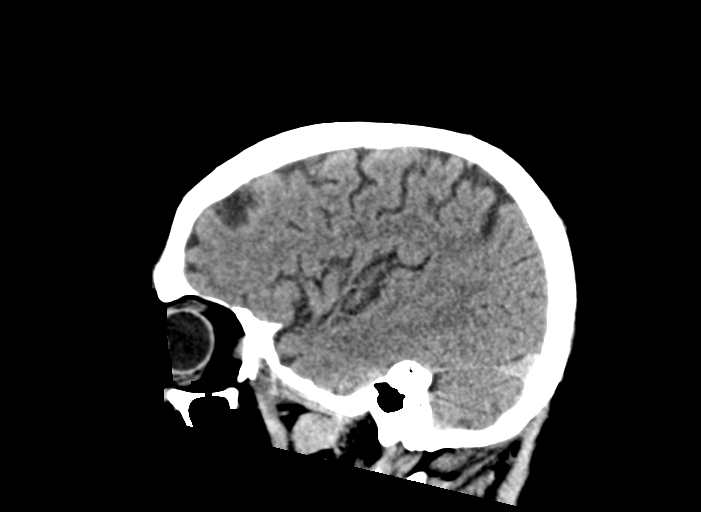
[im 34/67  brain]
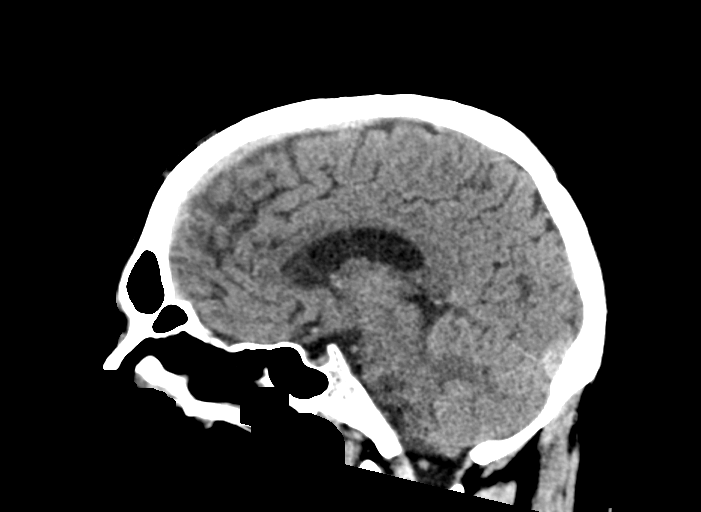
[im 45/67  brain]
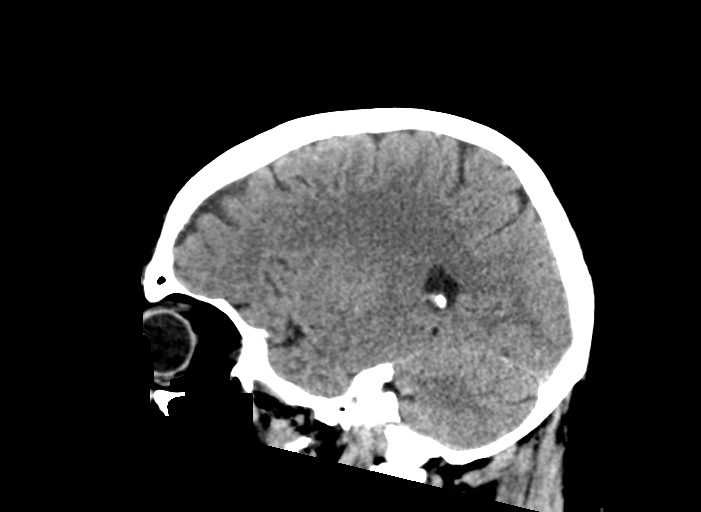

[14 of 47 positions shown; findings below may reference images not displayed]

FINDINGS: Brain: Unchanged appearance of multifocal subacute infarcts
scattered throughout the brain, involving both hemispheres, multiple
vascular territories and the cerebellum. No hemorrhage or mass
effect.

Vascular: No hyperdense vessel or unexpected calcification.

Skull: Normal. Negative for fracture or focal lesion.

Sinuses/Orbits: No acute finding.

Other: None.
IMPRESSION: 1. Unchanged appearance of multifocal subacute infarcts scattered
throughout the brain, involving both hemispheres and the cerebellum.
2. No hemorrhage or mass effect.

## 2019-06-03 MED ORDER — BUPROPION HCL 100 MG PO TABS: 150.0000 mg | ORAL_TABLET | Freq: Two times a day (BID) | ORAL | Status: DC

## 2019-06-03 MED ORDER — HYDROCORTISONE NA SUCCINATE PF 100 MG IJ SOLR
50.0000 mg | Freq: Two times a day (BID) | INTRAMUSCULAR | Status: DC
  Administered 2019-06-03 – 2019-06-04 (×2): 50 mg via INTRAVENOUS
  Filled 2019-06-03 (×2): qty 2

## 2019-06-03 MED ORDER — QUETIAPINE FUMARATE 25 MG PO TABS
25.0000 mg | ORAL_TABLET | Freq: Three times a day (TID) | ORAL | Status: DC | PRN
  Filled 2019-06-03: qty 1

## 2019-06-03 MED ORDER — ATORVASTATIN CALCIUM 80 MG PO TABS
80.0000 mg | ORAL_TABLET | Freq: Every day | ORAL | Status: DC
  Administered 2019-06-03 – 2019-06-04 (×2): 80 mg
  Filled 2019-06-03: qty 1

## 2019-06-03 MED ORDER — ASPIRIN 325 MG PO TABS
325.0000 mg | ORAL_TABLET | Freq: Every day | ORAL | Status: DC
  Administered 2019-06-03 – 2019-06-04 (×2): 325 mg
  Filled 2019-06-03: qty 1

## 2019-06-03 MED ORDER — GABAPENTIN 600 MG PO TABS
600.0000 mg | ORAL_TABLET | Freq: Three times a day (TID) | ORAL | Status: DC
  Administered 2019-06-03 – 2019-06-04 (×3): 600 mg
  Filled 2019-06-03 (×2): qty 1

## 2019-06-03 MED ORDER — QUETIAPINE FUMARATE 25 MG PO TABS
25.0000 mg | ORAL_TABLET | Freq: Three times a day (TID) | ORAL | Status: DC | PRN
  Administered 2019-06-03: 25 mg

## 2019-06-03 MED ORDER — BUPROPION HCL 100 MG PO TABS
150.0000 mg | ORAL_TABLET | Freq: Every day | ORAL | Status: DC
  Filled 2019-06-03: qty 2

## 2019-06-03 MED ORDER — GABAPENTIN 600 MG PO TABS
600.0000 mg | ORAL_TABLET | Freq: Three times a day (TID) | ORAL | Status: DC
  Filled 2019-06-03 (×2): qty 1

## 2019-06-03 MED ORDER — CLOPIDOGREL BISULFATE 75 MG PO TABS
75.0000 mg | ORAL_TABLET | Freq: Every day | ORAL | Status: DC
  Administered 2019-06-04: 75 mg
  Filled 2019-06-03: qty 1

## 2019-06-03 MED ORDER — CHLORHEXIDINE GLUCONATE CLOTH 2 % EX PADS
6.0000 | MEDICATED_PAD | Freq: Every day | CUTANEOUS | Status: DC
  Administered 2019-06-03 – 2019-06-11 (×8): 6 via TOPICAL

## 2019-06-03 MED ORDER — GUAIFENESIN-DM 100-10 MG/5ML PO SYRP
5.0000 mL | ORAL_SOLUTION | ORAL | Status: DC | PRN
  Administered 2019-06-03: 5 mL
  Filled 2019-06-03: qty 5

## 2019-06-03 MED ORDER — BUPROPION HCL 100 MG PO TABS
150.0000 mg | ORAL_TABLET | Freq: Every day | ORAL | Status: DC
  Administered 2019-06-04: 150 mg
  Filled 2019-06-03: qty 2

## 2019-06-03 MED ORDER — CLOPIDOGREL BISULFATE 75 MG PO TABS
300.0000 mg | ORAL_TABLET | Freq: Once | ORAL | Status: AC
  Administered 2019-06-03: 300 mg

## 2019-06-03 MED ORDER — WHITE PETROLATUM EX OINT
TOPICAL_OINTMENT | CUTANEOUS | Status: DC | PRN
  Administered 2019-06-03: 0.2 via TOPICAL
  Filled 2019-06-03 (×2): qty 28.35

## 2019-06-03 NOTE — Progress Notes (Signed)
  Echocardiogram 2D Echocardiogram has been performed.  Noemi Bellissimo A Shahiem Bedwell 06/03/2019, 8:34 AM

## 2019-06-03 NOTE — Progress Notes (Signed)
Social Note Appreciate CCM and Neurology taking over care of Adrian [name].  FPTS will be happy to resume care once patient is stable to transfer out of unit.  Dana Allan MD

## 2019-06-03 NOTE — Social Work (Addendum)
CSW met with pt at bedside. CSW introduced self and explained her role at the hospital. CSW complete Sbirt with pt. Pt was unable to speak due to severe dysarthria. Pt was able to shake head no to alcohol use and no to substance use. Pt did not need resources at this time.   Emeterio Reeve, Latanya Presser, [facility] Social Worker [phone]

## 2019-06-03 NOTE — Evaluation (Signed)
Physical Therapy Evaluation Patient Details Name: Adrian [name] MRN: [id number] DOB: 06-Nov-1965 Today's Date: 06/03/2019   History of Present Illness  54 y.o. male admitted on 06/02/19 for dysarthria and weakness.  Pt's MRI revealed numerous small supatentorial and infratentorial acute infarcts involving the verebral hemispheres, left thalamus, pons and right cerebellar hemisphere (embolic).  No tPA given due to outside of window.  Pt with significant PMH of tobacco abuse and chronic low back pain.   Clinical Impression  PT/OT co evaluation to facilitate safe transfer OOB to chair with two person mod assist.  R LE weak, but can take some weight during pivotal steps to the chair.  Pt with decreased cognition and significant dysarthria which is very frustrating for him.  He was tearful on and off throughout and we did not make it through all of his home set up information.  He would benefit from multi disciplinary rehab prior to return home with his wife and son.  PT to follow acutely for deficits listed below.      Follow Up Recommendations CIR    Equipment Recommendations  Rolling walker with 5" wheels;3in1 (PT)    Recommendations for Other Services Rehab consult     Precautions / Restrictions Precautions Precautions: Fall Precaution Comments: right sided weakness, dysarthria      Mobility  Bed Mobility Overal bed mobility: Needs Assistance Bed Mobility: Supine to Sit     Supine to sit: +2 for physical assistance;Mod assist     General bed mobility comments: Two person mod assist to support trunk and move bil LEs to EOB.  Light mod assist.  Pt needed help with his legs, pulled with bil hands to help support trunk.   Transfers Overall transfer level: Needs assistance Equipment used: 2 person hand held assist Transfers: Sit to/from [facility] Sit to Stand: +2 physical assistance;Mod assist Stand pivot transfers: +2 physical assistance;Mod assist       General  transfer comment: Two person mod assist to stand and pivot to pt's left from bed to chair. Pt able to take pivotal steps with bil LEs blocked. R leg buckling more and needed more assist to step and progress leg around.        Modified Rankin (Stroke Patients Only) Modified Rankin (Stroke Patients Only) Pre-Morbid Rankin Score: No symptoms Modified Rankin: Moderately severe disability     Balance Overall balance assessment: Needs assistance Sitting-balance support: Feet supported;Bilateral upper extremity supported Sitting balance-Leahy Scale: Fair Sitting balance - Comments: close supervision EOB.    Standing balance support: Bilateral upper extremity supported Standing balance-Leahy Scale: Poor Standing balance comment: needed mod two person assist to stand for balance and to block R LE.                             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  Some flexion buckling noted in stance and difficulty stepping and progressing leg around when taking pivotal steps to the chair.         Communication   Communication: Expressive difficulties(dysarthria)  Cognition  Arousal/Alertness: Awake/alert Behavior During Therapy: Impulsive Overall Cognitive Status: Impaired/Different from baseline Area of Impairment: Orientation;Attention;Memory;Following commands;Safety/judgement;Awareness;Problem solving                   Current Attention Level: Sustained Memory: Decreased recall of precautions;Decreased short-term memory Following Commands: Follows one step commands with increased time Safety/Judgement: Decreased awareness of safety;Decreased awareness of deficits Awareness: Intellectual Problem Solving: Difficulty sequencing;Requires verbal cues;Requires tactile cues General Comments: Pt emotionally labile, frustrated by speech difficulties, decreased awareness of his deficits and how it realates to safety.  "Can you get back to the bed safely by yourself?" "Yep".             Assessment/Plan    PT Assessment Patient needs continued PT services  PT Problem List Decreased strength;Decreased activity tolerance;Decreased balance;Decreased mobility;Decreased knowledge of use of DME;Decreased cognition;Decreased safety awareness;Decreased knowledge of precautions       PT Treatment Interventions DME instruction;Gait training;Stair training;Functional mobility training;Therapeutic activities;Therapeutic exercise;Balance training;Neuromuscular re-education;Cognitive remediation;Patient/family education    PT Goals (Current goals can be found in the Care Plan section)  Acute Rehab PT Goals Patient Stated Goal: to go home PT Goal Formulation: With patient Time For Goal Achievement: 06/17/19 Potential to Achieve Goals: Good    Frequency Min 4X/week        Co-evaluation PT/OT/SLP Co-Evaluation/Treatment: Yes Reason for Co-Treatment: Complexity of the patient's impairments (multi-system involvement);Necessary to address cognition/behavior during functional activity;To address functional/ADL transfers;For patient/therapist safety PT goals  addressed during session: Balance;Mobility/safety with mobility;Strengthening/ROM         AM-PAC PT "6 Clicks" Mobility  Outcome Measure Help needed turning from your back to your side while in a flat bed without using bedrails?: A Lot Help needed moving from lying on your back to sitting on the side of a flat bed without using bedrails?: A Lot Help needed moving to and from a bed to a chair (including a wheelchair)?: A Lot Help needed standing up from a chair using your arms (e.g., wheelchair or bedside chair)?: A Lot Help needed to walk in hospital room?: A Lot Help needed climbing 3-5 steps with a railing? : Total 6 Click Score: 11    End of Session   Activity Tolerance: Patient tolerated treatment well Patient left: in chair;with call bell/phone within reach;with chair alarm set   PT Visit Diagnosis: Muscle weakness (generalized) (M62.81);Difficulty in walking, not elsewhere classified (R26.2);Hemiplegia and hemiparesis Hemiplegia - Right/Left: Right Hemiplegia - dominant/non-dominant: Dominant Hemiplegia - caused by: Cerebral infarction    Time: 6948-5462 PT Time Calculation (min) (ACUTE ONLY): 22 min   Charges:         Corinna Capra, PT, DPT  Acute Rehabilitation [phone] pager #[phone] office     PT Evaluation $PT Eval Moderate Complexity: 1 Mod         06/03/2019, 12:35 PM

## 2019-06-03 NOTE — Procedures (Signed)
Patient Name: Adrian [name]  MRN: [id number]  Epilepsy Attending: Charlsie Quest  Referring Physician/Provider: Dr. Orpah Cobb Date: 06/02/2019 Duration: 30.13 minutes  Patient history: 54 year old male present with sudden onset of dizziness and dysarthria as well as right-sided weakness.  EEG evaluate for seizures.  Level of alertness: Awake  AEDs during EEG study: None  Technical aspects: This EEG study was done with scalp electrodes positioned according to the 10-20 International system of electrode placement. Electrical activity was acquired at a sampling rate of 500Hz  and reviewed with a high frequency filter of 70Hz  and a low frequency filter of 1Hz . EEG data were recorded continuously and digitally stored.   Description: The posterior dominant rhythm consists of 8 Hz activity of moderate voltage (25-35 uV) seen predominantly in posterior head regions, symmetric and reactive to eye opening and eye closing. Hyperventilation and photic stimulation were not performed.  IMPRESSION: This study is within normal limits. No seizures or epileptiform discharges were seen throughout the recording.  [name] 

## 2019-06-03 NOTE — ED Notes (Signed)
Pt experiencing extreme agitation. Forced his way out of R hand mitt and was not allowing staff to replace it for his safety. With assistance was able to put the mitt back on.

## 2019-06-03 NOTE — Progress Notes (Signed)
Rehab Admissions Coordinator Note:  Per OT, PT, and SLP, this patient was screened by Cheri Rous for appropriateness for an Inpatient Acute Rehab Consult.  At this time, we are recommending an Inpatient Rehab consult. AC will contact attending service to request order.   Cheri Rous 06/03/2019, 2:24 PM  I can be reached at [phone].

## 2019-06-03 NOTE — Progress Notes (Addendum)
Called by RN on floor at 11:33 PM for acute worsening of neurological status while patient was up and out of bed to attempt to have a bowel movement. The patient became dizzy and then slumped over. He was placed back in bed and continued to have decreased movement and responsiveness.   BP prior to standing up was 179/95. After the spell and while supine in bed, his repeat BP was 120/72.   STAT CT was ordered. In CT exam reveals patient to be awake, able to follow simple commands, but minimally verbal and with decreased spontaneous movements.   Ment: Awake, able to follow simple commands, but minimally verbal and with decreased spontaneous movements. Will barely audibly state "5" and "1" when five and one finger are held up, respectively.  CN: Pupils asymmetric. Eyes conjugate. Can track to left and right. There is horizontal nystagmus on leftward gaze. Face is symmetric.  Motor: 2/5 bilateral upper and lower extremities.  Sensory: Responds to noxious plantar stimulation bilaterally.  Reflexes: 3+ bilateral brachioradialis and patellae  A/R: 54 year old male who presented yesterday with acute basilar and vertebral artery occlusions and associated multifocal posterior circulation strokes. Now with acute worsening after a bowel movement.  1. Most likely etiology for his acute worsening is acute orthostatic hypotension.  2. Exam now somewhat better than it was immediately after the episode, but not back to his daytime baseline per RN.  3. CT head with no acute changes.  4. Obtaining STAT MRI brain.  5. IV NS bolus 1000 mL, followed by 125 cc/hr 6. Patient to be bedrest only. Will need to remain in bed for all toileting activities for now.   Addendum: -- MRI brain completed, revealing a slight increase in size of bilateral acute infarcts of the pons. Unchanged infarcts of the left thalamus, left caudate nucleus, right cerebellum and right frontal white matter. -- Continue ASA and Plavix --  Continue IVF -- Stroke team to consider possible basilar artery stenting versus off-label endovascular clot retraction procedure on AM rounds as the patient is demonstrating symptoms and signs most consistent with unstable hemodynamics distal to the basilar artery thrombosis, versus distal embolization phenomena from possible unstable clot.   35 minutes spent in the emergent neurological evaluation and management of this critically ill patient.   Electronically signed: Dr. Caryl Pina

## 2019-06-03 NOTE — Progress Notes (Addendum)
STROKE TEAM PROGRESS NOTE   INTERVAL HISTORY His RN is at the bedside. Case d/w family practice as they will take over once out of ICU. Currently stable BP off levophed. Pt has severe dysarthria, very difficult to understand. Although he was calm during morning exam, RN reports he has been anxious and threatening to leave AMA.Case also discussed with Dr. 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   Vitals:   06/03/19 0700 06/03/19 0800 06/03/19 0900 06/03/19 1000  BP: (!) 151/97 (!) 159/97 (!) 169/99 (!) 147/84  Pulse: 89 77 87 87  Resp: 17 12 16 17   Temp:  98.5 F (36.9 C)    TempSrc:  Oral    SpO2: 96% 97% 96% 96%  Weight:      Height:        CBC:  Recent Labs  Lab 06/02/19 1058 06/03/19 0657  WBC 21.3* 23.5*  NEUTROABS 13.8*  --   HGB 17.0 15.5  HCT 50.6 44.6  MCV 90.0 87.6  PLT 293 254    Basic Metabolic Panel:  Recent Labs  Lab 06/02/19 1058 06/03/19 0657  NA 140 137  K 3.6 3.3*  CL 102 104  CO2 24 19*  GLUCOSE 111* 107*  BUN 24* 14  CREATININE 1.03 0.86  CALCIUM 9.2 8.5*   Lipid Panel:     Component Value Date/Time   CHOL 221 (H) 06/02/2019 1830   TRIG 414 (H) 06/02/2019 1830   HDL 40 (L) 06/02/2019 1830   CHOLHDL 5.5 06/02/2019 1830   VLDL UNABLE TO CALCULATE IF TRIGLYCERIDE OVER 400 mg/dL 06/04/2019 26/37/8588   LDLCALC UNABLE TO CALCULATE IF TRIGLYCERIDE OVER 400 mg/dL 5027 74/01/8785   7672:  Lab Results  Component Value Date   HGBA1C 5.8 (H) 06/02/2019   Urine Drug Screen:     Component Value Date/Time   LABOPIA NONE DETECTED 06/02/2019 1400   COCAINSCRNUR NONE DETECTED 06/02/2019 1400   LABBENZ NONE DETECTED 06/02/2019 1400   AMPHETMU NONE DETECTED 06/02/2019 1400   THCU NONE DETECTED 06/02/2019 1400   LABBARB NONE DETECTED 06/02/2019 1400    Alcohol Level     Component Value Date/Time   ETH  <10 06/02/2019 1058    IMAGING past 24 hours CT ANGIO HEAD W OR WO CONTRAST  Result Date: 06/02/2019 CLINICAL DATA:  Stroke suspected. EXAM: CT ANGIOGRAPHY HEAD AND NECK TECHNIQUE: Multidetector CT imaging of the head and neck was performed using the standard protocol during bolus administration of intravenous contrast. Multiplanar CT image reconstructions and MIPs were obtained to evaluate the vascular anatomy. Carotid stenosis measurements (when applicable) are obtained utilizing NASCET criteria, using the distal internal carotid diameter as the denominator. CONTRAST:  06/04/2019 OMNIPAQUE IOHEXOL 350 MG/ML SOLN COMPARISON:  Brain MRI 06/02/2019. FINDINGS: CTA NECK FINDINGS Aortic arch: Standard aortic branching. There is an eccentric curvilinear filling defect within the lumen of aortic arch measuring 2.0 x 4.7 cm (series 7, image 344). Findings likely reflect eccentric thrombus. Additionally, there is occlusive thrombus within the proximal to mid innominate artery. There is reconstitution of flow at the level of the distal innominate artery. No significant proximal subclavian stenosis. Right carotid system: CCA and ICA patent within the neck without significant stenosis (50% or greater). Left carotid system: CCA and ICA patent within the neck without significant stenosis (50% or greater). Eccentric hypodensity within the mid left common carotid artery has an appearance most suggestive  of soft plaque. Eccentric thrombus cannot be definitively excluded. Vertebral arteries: The vertebral artery is slightly dominant. The vertebral arteries are patent within the neck. Soft plaque at the vertebral artery origins bilaterally with mild ostial stenosis on the left Skeleton: No acute bony abnormality or aggressive osseous lesion. Other neck: No neck mass or cervical lymphadenopathy. Chronic fracture deformity of the T1 spinous process Upper chest: No consolidation within the imaged lung apices. Review of the MIP images  confirms the above findings CTA HEAD FINDINGS Anterior circulation: The intracranial internal carotid arteries are patent without significant stenosis. The M1 middle cerebral arteries are patent without significant stenosis. No M2 proximal branch occlusion or high-grade proximal stenosis is identified. The anterior cerebral arteries are patent without significant proximal stenosis. No intracranial aneurysm is identified. Posterior circulation: There is occlusive thrombus within the distal V4 right vertebral artery. Occlusive thrombus is also suspected within the distal V4 left vertebral artery. Occlusive thrombus extends into the proximal and mid basilar artery. There is reconstitution of flow within the distal basilar artery. Intermittent and irregular opacification of the right PICA is seen and this vessel likely a contains multifocal thrombus. Flow is seen within the proximal left PICA. The AICA vessels are not well delineated. Flow is seen within the superior cerebellar arteries bilaterally. The posterior cerebral arteries are patent proximally without significant stenosis. However, emboli are suspected within the distal posterior cerebral arteries bilaterally given the pattern of acute infarcts demonstrated on brain MRI performed earlier the same day. Posterior communicating arteries are not definitively identified. Venous sinuses: Within limitations of contrast timing, no convincing thrombus. Anatomic variants: As described. Review of the MIP images confirms the above findings These results were called by telephone at the time of interpretation on 06/02/2019 at 7:11 pm to provider Doralee Albino , who verbally acknowledged these results. IMPRESSION: CTA neck: 1. Eccentric endoluminal thrombus within the aortic arch as described. 2. 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 3. The bilateral common and internal carotid arteries are patent within the neck without significant stenosis.  Eccentric hypodensity within the mid left common carotid artery has an appearance most suggestive of soft plaque. Eccentric thrombus at this site cannot be definitively excluded. 4. The vertebral arteries are patent within the neck bilaterally. Soft plaque at both vertebral artery origins with mild ostial stenosis on the left. CTA head: 1. Occlusive thrombus within the distal V4 right vertebral artery with occlusive thrombus extending into the proximal and mid basilar artery. Occlusive thrombus is also suspected within the very distal V4 left vertebral artery. There is reconstitution of flow within the distal basilar artery. 2. Only intermittent and irregular enhancement of the right PICA is seen and there is suspected multifocal thrombus within this vessel. 3. The bilateral AICAs are poorly delineated. 4. The posterior cerebral arteries are patent proximally without significant stenosis. However, emboli are suspected within the distal PCAs bilaterally given the pattern of acute infarcts demonstrated on same-day brain MRI. 5. No anterior circulation large vessel occlusion or proximal high-grade stenosis is identified. Electronically Signed   By: Jackey Loge DO   On: 06/02/2019 19:30   CT ANGIO NECK W OR WO CONTRAST  Result Date: 06/02/2019 CLINICAL DATA:  Stroke suspected. EXAM: CT ANGIOGRAPHY HEAD AND NECK TECHNIQUE: Multidetector CT imaging of the head and neck was performed using the standard protocol during bolus administration of intravenous contrast. Multiplanar CT image reconstructions and MIPs were obtained to evaluate the vascular anatomy. Carotid stenosis measurements (when applicable) are  obtained utilizing NASCET criteria, using the distal internal carotid diameter as the denominator. CONTRAST:  OMNIPAQUE IOHEXOL 350 MG/ML SOLN COMPARISON:  Brain MRI 06/02/2019. FINDINGS: CTA NECK FINDINGS Aortic arch: Standard aortic branching. There is an eccentric curvilinear filling defect within the lumen  of aortic arch measuring 2.0 x 4.7 cm (series 7, image 344). Findings likely reflect eccentric thrombus. Additionally, there is occlusive thrombus within the proximal to mid innominate artery. There is reconstitution of flow at the level of the distal innominate artery. No significant proximal subclavian stenosis. Right carotid system: CCA and ICA patent within the neck without significant stenosis (50% or greater). Left carotid system: CCA and ICA patent within the neck without significant stenosis (50% or greater). Eccentric hypodensity within the mid left common carotid artery has an appearance most suggestive of soft plaque. Eccentric thrombus cannot be definitively excluded. Vertebral arteries: The vertebral artery is slightly dominant. The vertebral arteries are patent within the neck. Soft plaque at the vertebral artery origins bilaterally with mild ostial stenosis on the left Skeleton: No acute bony abnormality or aggressive osseous lesion. Other neck: No neck mass or cervical lymphadenopathy. Chronic fracture deformity of the T1 spinous process Upper chest: No consolidation within the imaged lung apices. Review of the MIP images confirms the above findings CTA HEAD FINDINGS Anterior circulation: The intracranial internal carotid arteries are patent without significant stenosis. The M1 middle cerebral arteries are patent without significant stenosis. No M2 proximal branch occlusion or high-grade proximal stenosis is identified. The anterior cerebral arteries are patent without significant proximal stenosis. No intracranial aneurysm is identified. Posterior circulation: There is occlusive thrombus within the distal V4 right vertebral artery. Occlusive thrombus is also suspected within the distal V4 left vertebral artery. Occlusive thrombus extends into the proximal and mid basilar artery. There is reconstitution of flow within the distal basilar artery. Intermittent and irregular opacification of the right  PICA is seen and this vessel likely a contains multifocal thrombus. Flow is seen within the proximal left PICA. The AICA vessels are not well delineated. Flow is seen within the superior cerebellar arteries bilaterally. The posterior cerebral arteries are patent proximally without significant stenosis. However, emboli are suspected within the distal posterior cerebral arteries bilaterally given the pattern of acute infarcts demonstrated on brain MRI performed earlier the same day. Posterior communicating arteries are not definitively identified. Venous sinuses: Within limitations of contrast timing, no convincing thrombus. Anatomic variants: As described. Review of the MIP images confirms the above findings These results were called by telephone at the time of interpretation on 06/02/2019 at 7:11 pm to provider Doralee Albino , who verbally acknowledged these results. IMPRESSION: CTA neck: 1. Eccentric endoluminal thrombus within the aortic arch as described. 2. 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 3. The bilateral common and internal carotid arteries are patent within the neck without significant stenosis. Eccentric hypodensity within the mid left common carotid artery has an appearance most suggestive of soft plaque. Eccentric thrombus at this site cannot be definitively excluded. 4. The vertebral arteries are patent within the neck bilaterally. Soft plaque at both vertebral artery origins with mild ostial stenosis on the left. CTA head: 1. Occlusive thrombus within the distal V4 right vertebral artery with occlusive thrombus extending into the proximal and mid basilar artery. Occlusive thrombus is also suspected within the very distal V4 left vertebral artery. There is reconstitution of flow within the distal basilar artery. 2. Only intermittent and irregular enhancement of the right PICA is seen  and there is suspected multifocal thrombus within this vessel. 3. The bilateral AICAs are  poorly delineated. 4. The posterior cerebral arteries are patent proximally without significant stenosis. However, emboli are suspected within the distal PCAs bilaterally given the pattern of acute infarcts demonstrated on same-day brain MRI. 5. No anterior circulation large vessel occlusion or proximal high-grade stenosis is identified. Electronically Signed   By: Kellie Simmering DO   On: 06/02/2019 19:30   MR BRAIN WO CONTRAST  Result Date: 06/02/2019 CLINICAL DATA:  Speech difficulty. EXAM: MRI HEAD WITHOUT CONTRAST TECHNIQUE: Multiplanar, multiecho pulse sequences of the brain and surrounding structures were obtained without intravenous contrast. COMPARISON:  Noncontrast head CT 06/02/2019 FINDINGS: Brain: Please note the patient was unable to tolerate the full examination. As a result, postcontrast imaging could not be obtained. The acquired sequences are intermittently motion degraded. There are multiple small scattered foci of cortical and subcortical restricted diffusion within the bilateral cerebral hemispheres consistent with acute infarcts. These are most notably within the bilateral parietooccipital lobes, but also affect the bilateral frontal lobes. Additional small acute infarct within the left corona radiata. Multiple acute lacunar infarcts within the left thalamus. Multiple acute infarcts within the pons. Multiple acute infarcts within the right cerebellum. Corresponding T2/FLAIR hyperintensity at these sites. No significant mass effect. Chronic infarct within the right frontal lobe motor strip and high right parietal lobe. Background mild scattered T2/FLAIR hyperintensity within the cerebral white matter is nonspecific, but consistent with chronic small vessel ischemic disease. Mild generalized parenchymal atrophy. There is no evidence of intracranial mass. No midline shift or extra-axial fluid collection. No chronic intracranial blood products. Vascular: No definite loss of expected proximal  arterial flow voids. Skull and upper cervical spine: No focal marrow lesion. Sinuses/Orbits: Visualized orbits demonstrate no acute abnormality. Mild ethmoid and right maxillary sinus mucosal thickening. No significant mastoid effusion. IMPRESSION: The patient was unable to tolerate the full examination. As a result, postcontrast imaging could not be obtained. Numerous small supratentorial and infratentorial acute infarcts involving the cerebral hemispheres, left thalamus, pons and right cerebellar hemisphere. Given involvement of multiple vascular territories, findings are suggestive of an embolic process. Chronic right frontoparietal cortical infarcts. Background mild generalized parenchymal atrophy and chronic small vessel ischemic disease. Electronically Signed   By: Kellie Simmering DO   On: 06/02/2019 15:24   MR CERVICAL SPINE WO CONTRAST  Result Date: 06/02/2019 CLINICAL DATA:  Spinal stenosis EXAM: MRI CERVICAL SPINE WITHOUT CONTRAST TECHNIQUE: Multiplanar, multisequence MR imaging of the cervical spine was performed. No intravenous contrast was administered. COMPARISON:  None. FINDINGS: Significant motion artifact is present Alignment: Trace degenerative listhesis. Vertebrae: Vertebral body heights appear preserved apart from mild degenerative endplate irregularity. No definite marrow edema. Cord: No definite abnormal signal. Posterior Fossa, vertebral arteries, paraspinal tissues: Small right cerebellar infarct. Refer to MRI brain earlier same day Disc levels: C2-C3: Small central disc protrusion indents the ventral thecal sac. Facet hypertrophy no significant canal or foraminal stenosis C3-C4: Facet hypertrophy. No significant canal or foraminal stenosis. C4-C5: Probable left paracentral disc osteophyte complex. No canal stenosis. Probable left foraminal stenosis. C5-C6: Disc bulge with endplate osteophytic ridging eccentric to the left. Probable mild canal stenosis. Probable bilateral foraminal stenosis.  C6-C7: Disc bulge with endplate osteophytic ridging eccentric to the left. No significant canal or foraminal stenosis. C7-T1:  No significant canal or foraminal stenosis. IMPRESSION: Motion degraded study. There is no high-grade canal stenosis. Probable foraminal stenosis, which is difficult to evaluate in the setting of artifact. Electronically  Signed   By: Guadlupe SpanishPraneil  Patel M.D.   On: 06/02/2019 18:15   DG Chest Port 1 View  Result Date: 06/02/2019 CLINICAL DATA:  Recent stroke-like symptoms EXAM: PORTABLE CHEST 1 VIEW COMPARISON:  04/07/2016 FINDINGS: Cardiac shadow is mildly enlarged but accentuated by the portable technique. Gastric catheter is noted within the stomach but could be advanced several cm deeper to allow for the proximal side port to lie within the gastric lumen. Lungs are well aerated bilaterally. Mild bibasilar atelectasis is seen. No acute bony abnormality is noted. IMPRESSION: Gastric catheter as described. Mild bibasilar atelectasis. Electronically Signed   By: Alcide CleverMark  Lukens M.D.   On: 06/02/2019 21:34    PHYSICAL EXAM Constitutional: Appears well-developed and well-nourished. Mild-mod distress Eyes: No scleral injection, unequal pupils HENT: No OP obstrucion Head: Normocephalic.  Cardiovascular: Normal rate and regular rhythm.  Respiratory: Effort normal, non-labored breathing GI: Soft. No distension. There is no tenderness.  Skin: WDI  Neuro: Mental Status: Patient is awake, alert, oriented to person, place, month, year, and situation. Speech-severely dysarthric but not aphasic.  He is able to follow commands.  He is able to name and repeat.  Cranial Nerves: II: Visual Fields are full.  III,IV, VI: EOMI with saccadic pursuit but without ptosis or diploplia. Pupils unequal with the right being 2 mm and the left being 4 mm, round and reactive to light V: Facial sensation is symmetric to temperature VII: Facial movement with right weakness VIII: hearing is intact to  voice X: Palat elevates symmetrically XI: Shoulder shrug is symmetric. XII: tongue is midline without atrophy or fasciculations.  Motor: Normal tone and bulk -Unable to lift right arm off bed giving him 3/5 proximally with distally 4/5.  4/5 left upper extremity.  Left lower extremity 5/5 and right lower extremity 4/5, it takes a great deal of encouragement in his right leg due to back pain.  With repeated encouragement, he is able to hold his right leg about an inch off the bed with only mild drift. Sensory: Sensation is symmetric to light touch and temperature in the arms and legs. DSS Deep Tendon Reflexes: 3+ and symmetric in the biceps and patellae.  Plantars: Upgoing toes bilaterally Cerebellar: Finger-to-nose showed no dysmetria on the left, unable to do on the right. HKS with difficulty on the left, refuses on the right.   ASSESSMENT/PLAN Mr. Debbra RidingJerry R [name] is a 54 y.o. male with history of back pain, and very heavy tobacco smoking, presenting with dizziness, ataxia, right side weakness and severe dysarthria. He was outside of window for IV tPA and there was no LVO.   Stroke: multiple multifocal embolic infarcts mostly in posterior circulation but if you in bilateral anterior circulation white matter as well  Code Stroke multifocal areas of hypoattenuation scattered throughout the bilateral supratentorial brain and right cerebellum.  There may be mild associated vasogenic edema with a high right parietal lesion.  CTA head distal Rt V4 thrombus, Rt PICA with multifocal thrombus, bilat intracranial stenosis  CTA neck  Occlusive thrombus in innominate, bilat ICAs are not stenotic. Soft plaque in bilat vert arteries, eccentric endoluminal  thrombus in aortic arch.   MRI  numerous small supratentorial and infratentorial acute infarcts involving the cerebral hemispheres, left thalamus, pons and right cerebellar hemisphere.  This exam suggestive of embolic process.  2D Echo:  pending  LDL UNABLE TO CALCULATE IF TRIGLYCERIDE OVER 400 mg/dL  [id number] 5.8  Lovenox for VTE prophylaxis    Diet   Diet NPO  time specified     None prior to admission, now on ASA + Plavix (after Plavix load)  Therapy recommendations:  pending  Disposition:  pending  No dx of Hypertension . Permissive hypertension (OK if < 220/120) but gradually normalize in 5-7 days . Long-term BP goal normotensive; avoid hypotension . Levophed used initially to increase SBP over 120  Hyperlipidemia  Home meds: none  LDL UNABLE TO CALCULATE IF TRIGLYCERIDE OVER 400 mg/dL, goal < 70  Add max dose Lipitor  Continue statin at discharge   NO dx of Diabetes  HgbA1c 5.8, goal < 7.0  CBGs Recent Labs    06/02/19 1034 06/02/19 1045  GLUCAP 105* 108*     Other Stroke Risk Factors  Very heavy Cigarette smoker (4ppd); advised to stop smoking  Other Active Problems  Anxiety, threatening to leave AMA- he is on max dose nicotine patch to cut cravings. We have addressed back pain. Will add PRN Seroquel for now and after d/w pharmacy, we will start Bupropion 150mg  daily x3d, then BID to help with cessation down the line. Pt is not safe to leave AMA at this time. He has considerable stroke deficits. I have updated w/RN.   Severe chronic back pain and neuropathy- continue home gabapentin, steroids  Hospital day # 1   Desiree Metzger-Cihelka, ARNP-C, ANVP-BC Pager: [phone]  I have personally obtained history,examined this patient, reviewed notes, independently viewed imaging studies, participated in medical decision making and plan of care.ROS completed by me personally and pertinent positives fully documented  I have made any additions or clarifications directly to the above note. Agree with note above.  He presented with sudden onset of dizziness followed by slurred speech and stuttering weakness more than 24 hours ago and CT angiogram shows terminal vertebral and proximal basilar  artery occlusion with distal reconstitution and MRI shows multiple posterior circulation infarcts with a few anterior circulation white matter infarcts as well.  He initially was hypotensive requiring fluids and pressors but now has been weaned off pressor support.  Continue IV hydration and aspirin and Plavix.  Agree revascularization of the occluded basilar artery would be more risky at this time and continue ongoing medical management.  Case discussed with Dr. [phone] interventional neuroradiologist who agrees with plan.  Continue ongoing stroke work-up and close neurological monitoring.This patient is critically ill and at significant risk of neurological worsening, death and care requires constant monitoring of vital signs, hemodynamics,respiratory and cardiac monitoring, extensive review of multiple databases, frequent neurological assessment, discussion with family, other specialists and medical decision making of high complexity.I have made any additions or clarifications directly to the above note.This critical care time does not reflect procedure time, or teaching time or supervisory time of PA/NP/Med Resident etc but could involve care discussion time.  I spent 30 minutes of neurocritical care time  in the care of  this patient.      Sherlon Handing, MD Medical Director [facility] Pager: [phone] 06/03/2019 2:27 PM  To contact Stroke Continuity provider, please refer to 06/05/2019. After hours, contact General Neurology

## 2019-06-03 NOTE — Evaluation (Signed)
Clinical/Bedside Swallow Evaluation Patient Details  Name: Adrian [name] MRN: [id number] Date of Birth: 03-Feb-1966  Today's Date: 06/03/2019 Time: SLP Start Time (ACUTE ONLY): 1000 SLP Stop Time (ACUTE ONLY): 1015 SLP Time Calculation (min) (ACUTE ONLY): 15 min  Past Medical History:  Past Medical History:  Diagnosis Date  . Tobacco abuse    Past Surgical History: History reviewed. No pertinent surgical history. HPI:  This is a 54 year old gentleman with sudden onset of dizziness followed by dysarthria and right-sided weakness.  MRI confirms both supratentorial and infratentorial bilateral scattered infarcts.  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 MRI brain-numerous small supratentorial and infratentorial acute infarcts involving the cerebral hemispheres, left thalamus, pons and right cerebellar hemisphere.    Assessment / Plan / Recommendation Clinical Impression  Pt demonstrates severe dysarthria, and oral dysphagia with high risk of aspiration. He was very labile at the time of assesement and needed a slow pace and positive reinforcement to participate. He appears to have a primary lingual fine motor impariment with increased right sided lingual weakness appreciated. Despite this, pt was able to orally manipulate ice and initaite a swallow with ice and 1/2 teaspoons of honey thick liquids. When a small cup sip of honey thick liquid was attempted pt had a strong and prolonged cough response to probable aspiration. Instrumental assessment will be needed to determine best diet and strategies. Currently pt has a firm NG tube in place which can significantly impact swallow function during MBS. Recommend MBS tomorrow when NG can be removed and Cortrak can be placed if needed. Pt can have a few ice chips with RN in the meantime, otherwise NPO SLP Visit Diagnosis: Dysarthria and anarthria  (R47.1);Dysphagia, oropharyngeal phase (R13.12)    Aspiration Risk  Severe aspiration risk;Risk for inadequate nutrition/hydration    Diet Recommendation NPO;Alternative means - temporary;Ice chips PRN after oral care        Other  Recommendations Oral Care Recommendations: Oral care QID   Follow up Recommendations Inpatient Rehab      Frequency and Duration            Prognosis        Swallow Study   General HPI: This is a 54 year old gentleman with sudden onset of dizziness followed by dysarthria and right-sided weakness.  MRI confirms both supratentorial and infratentorial bilateral scattered infarcts.  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 MRI brain-numerous small supratentorial and infratentorial acute infarcts involving the cerebral hemispheres, left thalamus, pons and right cerebellar hemisphere.  Type of Study: Bedside Swallow Evaluation Diet Prior to this Study: NPO Temperature Spikes Noted: No Respiratory Status: Room air History of Recent Intubation: No Behavior/Cognition: Alert Oral Cavity Assessment: Within Functional Limits Oral Care Completed by SLP: No Oral Cavity - Dentition: Poor condition Vision: Functional for self-feeding Self-Feeding Abilities: Able to feed self Patient Positioning: Upright in bed Baseline Vocal Quality: Normal Volitional Cough: Strong Volitional Swallow: Able to elicit    Oral/Motor/Sensory Function Overall Oral Motor/Sensory Function: Severe impairment Facial ROM: Reduced right;Reduced left Facial Symmetry: Abnormal symmetry right;Abnormal symmetry left Facial Strength: Reduced right;Reduced left Lingual ROM: Reduced right;Suspected CN XII (hypoglossal) dysfunction Lingual Symmetry: Within Functional Limits Lingual Strength: Reduced Velum: Suspected CN X (Vagus) dysfunction Mandible: Within Functional Limits   Ice Chips Ice  chips: Impaired Presentation: Spoon  Oral Phase Impairments: Reduced lingual movement/coordination;Impaired mastication   Thin Liquid Thin Liquid: Not tested    Nectar Thick Nectar Thick Liquid: Not tested   Honey Thick Honey Thick Liquid: Impaired Presentation: Spoon;Cup Pharyngeal Phase Impairments: Cough - Immediate   Puree Puree: Not tested   Solid     Solid: Not tested     Harlon Ditty, MA CCC-SLP  Acute Rehabilitation Services Pager [phone] Office [phone]  Claudine Mouton 06/03/2019,10:46 AM

## 2019-06-03 NOTE — Progress Notes (Signed)
NAME:  Adrian [name], MRN:  [id number], DOB:  07-30-1965, LOS: 1 ADMISSION DATE:  06/02/2019, CONSULTATION DATE:  06/02/2019 REFERRING MD:  Dr. Leonel Ramsay, CHIEF COMPLAINT:  Weakness  Brief History   54 year old male history of heavy tobacco abuse and back pain on prolonged steroid tapers presenting with progressive right-sided weakness, dysarthria, and headache since 4/13 morning found to have both supratentorial and infratentorial bilateral scattered infarcts and basilar occlusion.  Hypotensive with SBP in the 80's despite 3L NS.  Neurology admitting, PCCM consulted for vasopressor support management for SBP of at least 120.  History of present illness   HPI obtained from spouse at bedside as patient is severely dysarthric.  54 year old male with history of tobacco abuse back pain presenting with strokelike symptoms.  Works as an Forensic psychologist.  Of note he has had increased back pain over the recent months and been on several long tapers of prednisone ( 28 day taper) taking Neurontin as he is unable to take NSAIDs.  Spouse states that he normally smokes 1-1/2 pack/day but given increased pain has been smoking 4 to 5 packs of cigarettes a day.  Other than this, patient was at his baseline.  Denies alcohol use.  No recent fevers or chills.  Has chronic productive cough.  Patient was at work yesterday, 06/02/2019, and was found by coworkers laying in the yard.  Patient had complained of dizziness and episodes of vomiting, possibly 2.  They carried him home however he refused to go to the hospital for evaluation.  Symptoms of dysarthria, headache, and right-sided weakness progressed.  Per spouse, symptoms today have been stable but given still present, he presented 4/14 for evaluation.  Last known well 4/13 am, possibly 4/12 prior to going to bed.  From what can be understood, patient wants to leave AMA but agreed with Neurology to stay the night.   ER workup noted for WBC 21.3, glucose 111, BUN 24, ALT 46,  HDL 40, LDL unable to calculate, cholesterol 221, HA1c 5.8, normal coags.  CT head showed multifocal areas of hypoattenuation scattered throughout the bilateral supratentorial brain and right cerebellum with mild associated vasogenic edema.  MRI confirming both supratentorial and infratentorial bilateral scattered infarcts. Found on CTA to have basilar occlusion.  He has been afebrile, on room air with saturations 93-99%, normal work of breathing, and blood pressures consistently with systolic in the 78'G despite 3 L NS bolus.  Neurology to admit, however wants systolic blood pressure of at least 120 for perfusion, therefore PCCM consulted for vasopressor support.   Past Medical History  Tobacco abuse, back pain  Significant Hospital Events   4/13 symptom onset 4/14 admitted  Consults:  PCCM  Procedures:   Significant Diagnostic Tests:  4/14 [facility] [place] >> Multifocal areas of hypoattenuation scattered throughout the bilateral supratentorial brain and right cerebellum. There may be mild associated vasogenic edema with a high right parietal lesion. Findings are suspicious for multifocal brain metastases. Further evaluation with contrast-enhanced MRI of the brain is recommended.  4/14 CTA head >> 1. Occlusive thrombus within the distal V4 right vertebral artery with occlusive thrombus extending into the proximal and mid basilar artery. Occlusive thrombus is also suspected within the very distal V4 left vertebral artery. There is reconstitution of flow within the distal basilar artery. 2. Only intermittent and irregular enhancement of the right PICA is seen and there is suspected multifocal thrombus within this vessel. 3. The bilateral AICAs are poorly delineated. 4. The posterior cerebral arteries are patent proximally without  significant stenosis. However, emboli are suspected within the distal PCAs bilaterally given the pattern of acute infarcts demonstrated on same-day brain MRI. 5. No  anterior circulation large vessel occlusion or proximal high-grade stenosis is identified.  4/14 CTA neck  >> 1. Eccentric endoluminal thrombus within the aortic arch as described. 2. 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 3. The bilateral common and internal carotid arteries are patent within the neck without significant stenosis. Eccentric hypodensity within the mid left common carotid artery has an appearance most suggestive of soft plaque. Eccentric thrombus at this site cannot be definitively excluded. 4. The vertebral arteries are patent within the neck bilaterally. Soft plaque at both vertebral artery origins with mild ostial stenosis on the left.  4/14 MRI brain >> The patient was unable to tolerate the full examination. As a result, postcontrast imaging could not be obtained. Numerous small supratentorial and infratentorial acute infarcts involving the cerebral hemispheres, left thalamus, pons and right cerebellar hemisphere. Given involvement of multiple vascular territories, findings are suggestive of an embolic process. Chronic right frontoparietal cortical infarcts. Background mild generalized parenchymal atrophy and chronic small vessel ischemic disease.  4/14 MRI cervical >> Motion degraded study. There is no high-grade canal stenosis. Probable foraminal stenosis, which is difficult to evaluate in the setting of artifact.  4/15 TTE >>  Micro Data:  4/14 SARS 2 >>   Antimicrobials:  n/a  Interim history/subjective:  Forearm male who is dysarthric but hemodynamically stable.  Little for pulmonary critical care to do at this time.  We will be available as needed. Objective   Blood pressure (!) 159/97, pulse 77, temperature 98.5 F (36.9 C), temperature source Oral, resp. rate 12, height 5\' 7"  (1.702 m), weight 71.1 kg, SpO2 97 %.        Intake/Output Summary (Last 24 hours) at 06/03/2019 0851 Last data filed at 06/03/2019  0700 Gross per 24 hour  Intake 1428.68 ml  Output --  Net 1428.68 ml   Filed Weights   06/02/19 1137 06/03/19 0233  Weight: 68 kg 71.1 kg    Examination: General: No who appears older than stated age HEENT: No JVD or lymphadenopathy is appreciated Neuro: Extremely dysarthric garbled speech word salad CV: Heart sounds are regular rate and rhythm PULM: Diminished breath sounds  GI: soft, bsx4 active  Extremities: warm/dry,  edema  Skin: no rashes or lesions   Resolved Hospital Problem list    Assessment & Plan:   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  Hypotension  - despite 3L NS - has been on several long prednisone tapers (28 days) for his back pain, raising the concern for adrenal insufficieny  P:  Currently normotensive at 159/97 Status post fluid resuscitation Decrease Solu-Cortef to 50 every 12 hours for questionable adrenal insufficiency Continue to monitor for hypotension   Leukocytosis  - likely related to steroid use, but given vomiting hx, will need to rule out aspiration event - UA neg, afebrile P:  Monitor for overt infection Procalcitonin reassuring at less than 0.10  Tobacco abuse  P:  Nicotine patch currently in place Stop  smoking  Best practice:  Diet: NPO Pain/Anxiety/Delirium protocol (if indicated): n/a, if needed for back pain, consider low dose fentanyl  VAP protocol (if indicated): n/a DVT prophylaxis: lovenox GI prophylaxis: PPI Glucose control: trend on  BMET Mobility: PT/OT Code Status: full  Family Communication: 06/03/2019 no family at bedside Disposition: Neuro ICU   Labs   CBC: Recent Labs  Lab 06/02/19 1058 06/03/19 0657  WBC 21.3* 23.5*  NEUTROABS 13.8*  --   HGB 17.0 15.5  HCT 50.6 44.6  MCV 90.0 87.6  PLT 293 254    Basic Metabolic Panel: Recent Labs  Lab 06/02/19 1058 06/03/19 0657  NA 140 137  K 3.6 3.3*  CL 102 104  CO2 24 19*  GLUCOSE 111* 107*  BUN 24* 14  CREATININE 1.03 0.86  CALCIUM 9.2 8.5*   GFR: Estimated Creatinine Clearance: 91.8 mL/min (by C-G formula based on SCr of 0.86 mg/dL). Recent Labs  Lab 06/02/19 1058 06/02/19 2104 06/03/19 0657  PROCALCITON  --  <0.10  --   WBC 21.3*  --  23.5*    Liver Function Tests: Recent Labs  Lab 06/02/19 1058 06/03/19 0657  AST 25 20  ALT 46* 39  ALKPHOS 92 80  BILITOT 0.9 1.6*  PROT 7.1 6.3*  ALBUMIN 3.7 3.1*   No results for input(s): LIPASE, AMYLASE in the last 168 hours. No results for input(s): AMMONIA in the last 168 hours.  ABG No results found for: PHART, PCO2ART, PO2ART, HCO3, TCO2, ACIDBASEDEF, O2SAT   Coagulation Profile: Recent Labs  Lab 06/02/19 1058  INR 0.9    Cardiac Enzymes: Recent Labs  Lab 06/02/19 1830  CKTOTAL 118    HbA1C: Hgb A1c MFr Bld  Date/Time Value Ref Range Status  06/02/2019 06:30 PM 5.8 (H) 4.8 - 5.6 % Final    Comment:    (NOTE) Pre diabetes:          5.7%-6.4% Diabetes:              >6.4% Glycemic control for   <7.0% adults with diabetes     CBG: Recent Labs  Lab 06/02/19 1034 06/02/19 1045  GLUCAP 105* 108*       App cct 34 min   Brett Canales Gaylene Moylan ACNP Acute Care Nurse Practitioner [facility] Pulmonary/Critical Care Please consult Amion 06/03/2019, 8:52 AM

## 2019-06-03 NOTE — Evaluation (Signed)
Occupational Therapy Evaluation Patient Details Name: Adrian [name] MRN: [id number] DOB: May 07, 1965 Today's Date: 06/03/2019    History of Present Illness 54 y.o. male admitted on 06/02/19 for dysarthria and weakness.  Pt's MRI revealed numerous small supatentorial and infratentorial acute infarcts involving the verebral hemispheres, left thalamus, pons and right cerebellar hemisphere (embolic).  No tPA given due to outside of window.  Pt with significant PMH of tobacco abuse and chronic low back pain.    Clinical Impression   PTA, pt was living with his wife and was independent and working; per pt report. Pt currently requiring Mod A for UB ADLs, Max A for LB ADLs, and Mod A +2 for functional transfers. Pt presenting with emotional lability, decreased cognition, poor balance, and weakness. During stand pivot to recliner, pt requiring Mod A +2 and bilateral knees blocked with buckling. Despite fatigue and weakness, pt motivated to participate in therapy. Pt would benefit from further acute OT to facilitate safe dc. Recommend dc to CIR for further OT to optimize safety, independence with ADLs, and return to PLOF.     Follow Up Recommendations  CIR;Supervision/Assistance - 24 hour    Equipment Recommendations  Other (comment)(Defer to next venue)    Recommendations for Other Services PT consult;Rehab consult;Speech consult     Precautions / Restrictions Precautions Precautions: Fall Precaution Comments: right sided weakness, dysarthria Restrictions Weight Bearing Restrictions: No      Mobility Bed Mobility Overal bed mobility: Needs Assistance Bed Mobility: Supine to Sit     Supine to sit: +2 for physical assistance;Mod assist     General bed mobility comments: Two person mod assist to support trunk and move bil LEs to EOB.  Light mod assist.  Pt needed help with his legs, pulled with bil hands to help support trunk.   Transfers Overall transfer level: Needs assistance Equipment  used: 2 person hand held assist Transfers: Sit to/from [facility] Sit to Stand: +2 physical assistance;Mod assist Stand pivot transfers: +2 physical assistance;Mod assist       General transfer comment: Two person mod assist to stand and pivot to pt's left from bed to chair. Pt able to take pivotal steps with bil LEs blocked. R leg buckling more and needed more assist to step and progress leg around.      Balance Overall balance assessment: Needs assistance Sitting-balance support: Feet supported;Bilateral upper extremity supported Sitting balance-Leahy Scale: Fair Sitting balance - Comments: close supervision EOB.    Standing balance support: Bilateral upper extremity supported Standing balance-Leahy Scale: Poor Standing balance comment: needed mod two person assist to stand for balance and to block R LE.                           ADL either performed or assessed with clinical judgement   ADL Overall ADL's : Needs assistance/impaired Eating/Feeding: NPO   Grooming: Wash/dry face;Minimal assistance;Sitting   Upper Body Bathing: Moderate assistance;Sitting   Lower Body Bathing: Maximal assistance;Sit to/from stand;+2 for safety/equipment;+2 for physical assistance   Upper Body Dressing : Moderate assistance;Sitting   Lower Body Dressing: Maximal assistance;+2 for physical assistance;+2 for safety/equipment;Sit to/from stand   Toilet Transfer: +2 for physical assistance;+2 for safety/equipment;Stand-pivot;Moderate assistance           Functional mobility during ADLs: Moderate assistance;+2 for physical assistance;+2 for safety/equipment(stand pivot)       Vision         Perception  Praxis      Pertinent Vitals/Pain Pain Assessment: No/denies pain     Hand Dominance Right   Extremity/Trunk Assessment Upper Extremity Assessment Upper Extremity Assessment: Generalized weakness   Lower Extremity Assessment Lower Extremity  Assessment: Defer to PT evaluation RLE Deficits / Details: right leg weaker than left leg, but able to take weight.  Some flexion buckling noted in stance and difficulty stepping and progressing leg around when taking pivotal steps to the chair.         Communication Communication Communication: Expressive difficulties(dysarthria)   Cognition Arousal/Alertness: Awake/alert Behavior During Therapy: Impulsive Overall Cognitive Status: Impaired/Different from baseline Area of Impairment: Orientation;Attention;Memory;Following commands;Safety/judgement;Awareness;Problem solving                 Orientation Level: (Pt becoming frustrated and avoiding orientation questions) Current Attention Level: Sustained Memory: Decreased recall of precautions;Decreased short-term memory Following Commands: Follows one step commands with increased time Safety/Judgement: Decreased awareness of safety;Decreased awareness of deficits Awareness: Intellectual Problem Solving: Difficulty sequencing;Requires verbal cues;Requires tactile cues General Comments: Pt emotionally labile, frustrated by speech difficulties, decreased awareness of his deficits and how it realates to safety.  "Can you get back to the bed safely by yourself?" "Yep".   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"        OT Problem List: Decreased strength;Decreased range of motion;Decreased activity  tolerance;Impaired balance (sitting and/or standing);Decreased safety awareness;Decreased knowledge of use of DME or AE;Decreased knowledge of precautions;Pain;Decreased cognition;Decreased coordination;Impaired UE functional use      OT Treatment/Interventions: Self-care/ADL training;Therapeutic exercise;Energy conservation;DME and/or AE instruction;Therapeutic activities;Patient/family education    OT Goals(Current goals can be found in the care plan section) Acute Rehab OT Goals Patient Stated Goal: to go home OT Goal Formulation: With patient Time For Goal Achievement: 06/17/19 Potential to Achieve Goals: Good  OT Frequency: Min 2X/week   Barriers to D/C:            Co-evaluation PT/OT/SLP Co-Evaluation/Treatment: Yes Reason for Co-Treatment: For patient/therapist safety;To address functional/ADL transfers PT goals addressed during session: Balance;Mobility/safety with mobility;Strengthening/ROM OT goals addressed during session: ADL's and self-care      AM-PAC OT "6 Clicks" Daily Activity     Outcome Measure Help from another person eating meals?: Total Help from another person taking care of personal grooming?: A Little Help from another person toileting, which includes using toliet, bedpan, or urinal?: A Lot Help from another person bathing (including washing, rinsing, drying)?: A Lot Help from another person to put on and taking off regular upper body clothing?: A Lot Help from another person to put on and taking off regular lower body clothing?: A Lot 6 Click Score: 12   End of Session Equipment Utilized During Treatment: Gait belt Nurse Communication: Mobility status  Activity Tolerance: Patient tolerated treatment well Patient left: in chair;with call bell/phone within reach;with chair  alarm set  OT Visit Diagnosis: Unsteadiness on feet (R26.81);Other abnormalities of gait and mobility (R26.89);Muscle weakness (generalized) (M62.81);Pain                Time:  2947-6546 OT Time Calculation (min): 21 min Charges:  OT General Charges $OT Visit: 1 Visit OT Evaluation $OT Eval Moderate Complexity: 1 Mod  Augustus Zurawski MSOT, OTR/L Acute Rehab Pager: [phone] Office: [phone]  Theodoro Grist Khaylee Mcevoy 06/03/2019, 1:14 PM

## 2019-06-04 ENCOUNTER — Inpatient Hospital Stay (HOSPITAL_COMMUNITY): Payer: Medicaid Other

## 2019-06-04 DIAGNOSIS — I6340 Cerebral infarction due to embolism of unspecified cerebral artery: Secondary | ICD-10-CM

## 2019-06-04 DIAGNOSIS — I651 Occlusion and stenosis of basilar artery: Secondary | ICD-10-CM

## 2019-06-04 DIAGNOSIS — M5441 Lumbago with sciatica, right side: Secondary | ICD-10-CM

## 2019-06-04 DIAGNOSIS — Z720 Tobacco use: Secondary | ICD-10-CM

## 2019-06-04 DIAGNOSIS — G8929 Other chronic pain: Secondary | ICD-10-CM

## 2019-06-04 DIAGNOSIS — D72829 Elevated white blood cell count, unspecified: Secondary | ICD-10-CM

## 2019-06-04 DIAGNOSIS — R7303 Prediabetes: Secondary | ICD-10-CM

## 2019-06-04 LAB — PATHOLOGIST SMEAR REVIEW

## 2019-06-04 LAB — CBC
HCT: 48 % (ref 39.0–52.0)
Hemoglobin: 16.7 g/dL (ref 13.0–17.0)
MCH: 30.5 pg (ref 26.0–34.0)
MCHC: 34.8 g/dL (ref 30.0–36.0)
MCV: 87.6 fL (ref 80.0–100.0)
Platelets: 210 10*3/uL (ref 150–400)
RBC: 5.48 MIL/uL (ref 4.22–5.81)
RDW: 14.2 % (ref 11.5–15.5)
WBC: 19.9 10*3/uL — ABNORMAL HIGH (ref 4.0–10.5)
nRBC: 0 % (ref 0.0–0.2)

## 2019-06-04 LAB — MAGNESIUM: Magnesium: 2.1 mg/dL (ref 1.7–2.4)

## 2019-06-04 LAB — BASIC METABOLIC PANEL
Anion gap: 13 (ref 5–15)
BUN: 15 mg/dL (ref 6–20)
CO2: 18 mmol/L — ABNORMAL LOW (ref 22–32)
Calcium: 8.3 mg/dL — ABNORMAL LOW (ref 8.9–10.3)
Chloride: 109 mmol/L (ref 98–111)
Creatinine, Ser: 0.87 mg/dL (ref 0.61–1.24)
GFR calc Af Amer: >60 mL/min (ref >60)
GFR calc non Af Amer: >60 mL/min (ref >60)
Glucose, Bld: 112 mg/dL — ABNORMAL HIGH (ref 70–99)
Potassium: 3.8 mmol/L (ref 3.5–5.1)
Sodium: 140 mmol/L (ref 135–145)

## 2019-06-04 LAB — HEPARIN LEVEL (UNFRACTIONATED): Heparin Unfractionated: 0.26 IU/mL — ABNORMAL LOW (ref 0.30–0.70)

## 2019-06-04 IMAGING — MR MR HEAD W/O CM
10 of 11 series · 42 of 48 positions shown · non-contrast
Comparison: Head CT [DATE]

Brain MRI [DATE]

CLINICAL DATA: Stroke follow-up

EXAM:
MRI HEAD WITHOUT CONTRAST
TECHNIQUE: Multiplanar, multiecho pulse sequences of the brain and surrounding
structures were obtained without intravenous contrast.

[Series 5: DWI · axial · 3.0mm · 0.88mm/px · z∈[-65,+71]mm · 9 of 94 slices shown (1 of 4)]
[im 1/94]
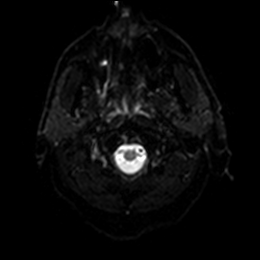
[im 12/94]
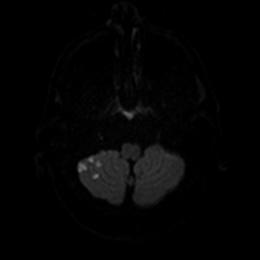
[im 24/94]
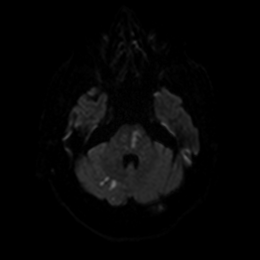
[im 35/94]
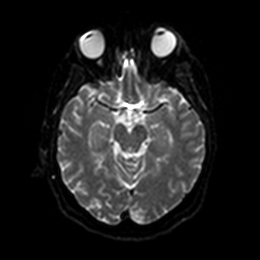
[im 47/94]
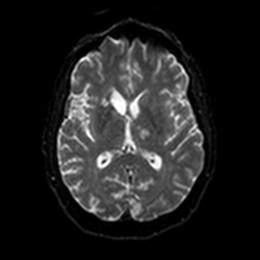
[im 59/94]
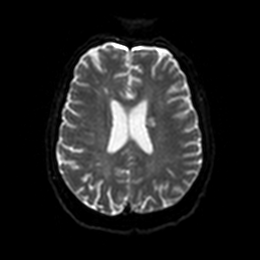
[im 70/94]
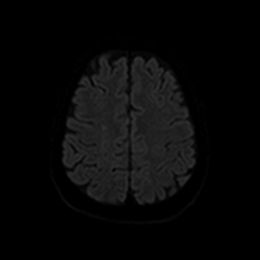
[im 82/94]
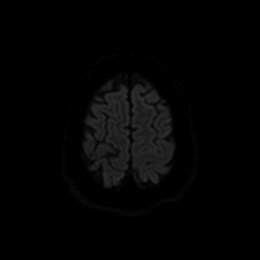
[im 94/94]
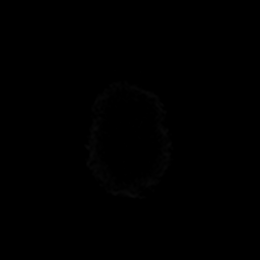

[Series 6: DWI · axial · 3.0mm · 0.88mm/px · z∈[-65,+71]mm · 4 of 47 slices shown (2 of 4)]
[im 1/47]
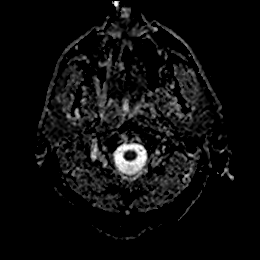
[im 16/47]
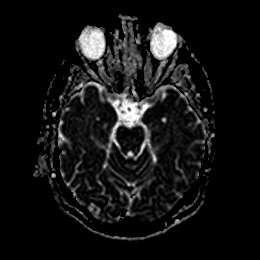
[im 31/47]
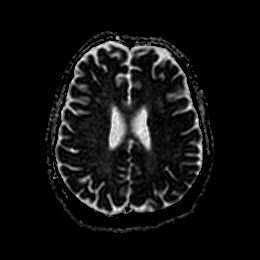
[im 47/47]
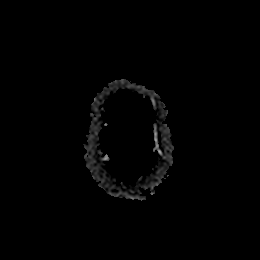

[Series 7: DWI · coronal · 4.0mm · 0.88mm/px · 6 of 66 slices shown (3 of 4)]
[im 1/66]
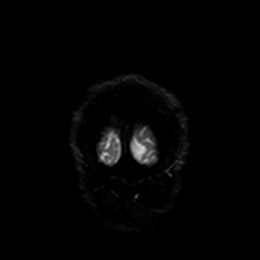
[im 14/66]
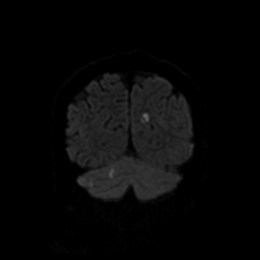
[im 27/66]
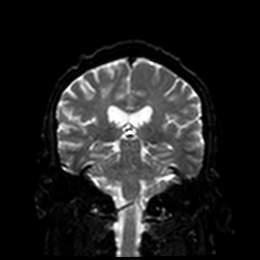
[im 40/66]
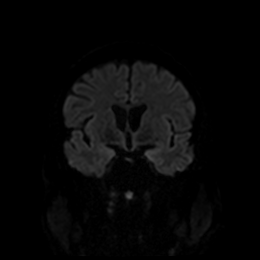
[im 53/66]
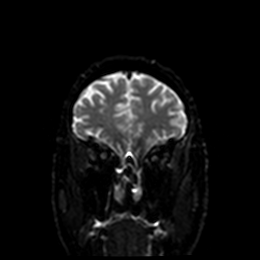
[im 66/66]
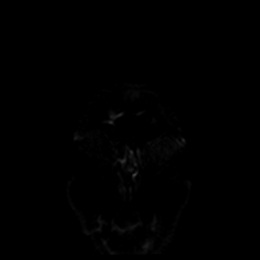

[Series 8: DWI · coronal · 4.0mm · 0.88mm/px · 3 of 33 slices shown (4 of 4)]
[im 1/33]
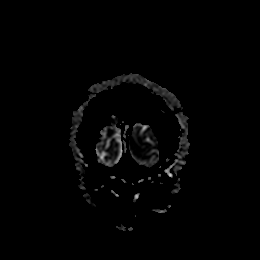
[im 17/33]
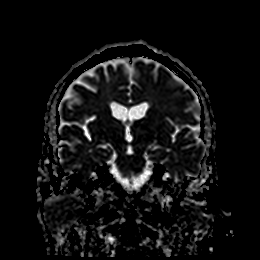
[im 33/33]
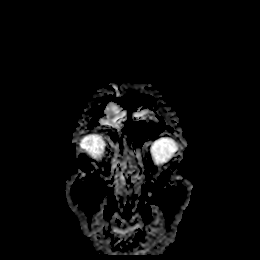

[Series 9: T1 · sagittal · 5.0mm · 0.75mm/px · 2 of 25 slices shown]
[im 1/25]
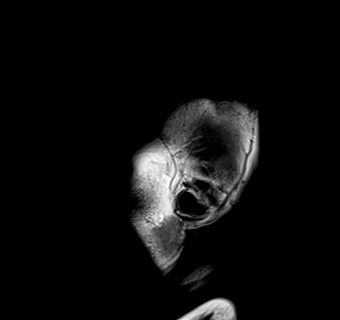
[im 25/25]
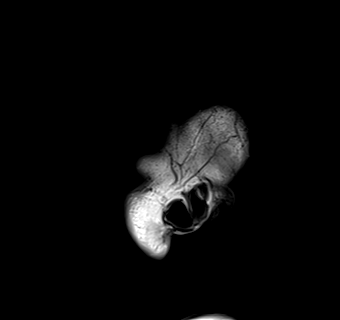

[Series 10: T2 · axial · 5.0mm · 0.72mm/px · z∈[-68,+74]mm · 2 of 25 slices shown (1 of 2)]
[im 1/25]
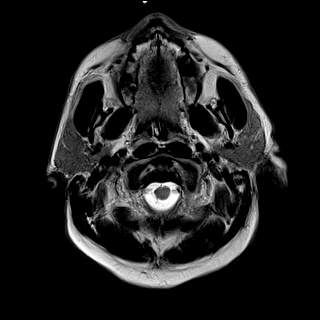
[im 25/25]
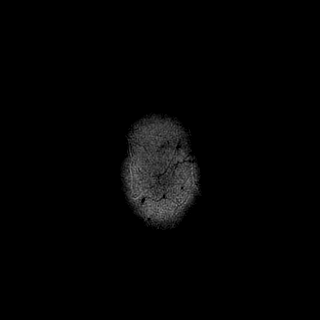

[Series 11: FLAIR · axial · 5.0mm · 0.45mm/px · z∈[-67,+75]mm · 2 of 25 slices shown]
[im 1/25]
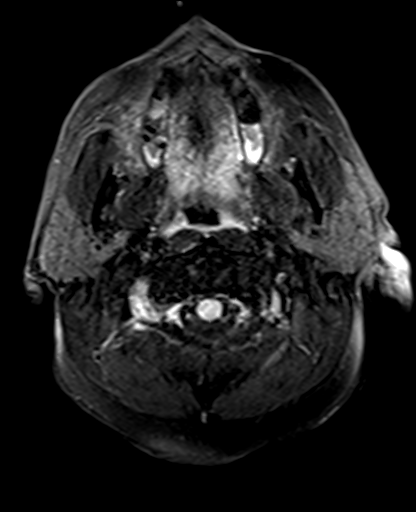
[im 25/25]
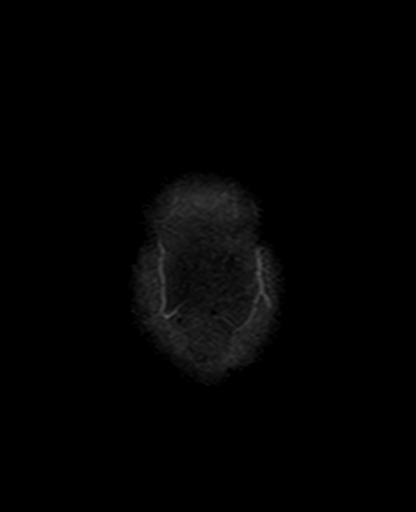

[Series 13: pha_images · axial · 3.0mm · 0.90mm/px · z∈[-83,+91]mm · 5 of 55 slices shown]
[im 1/55]
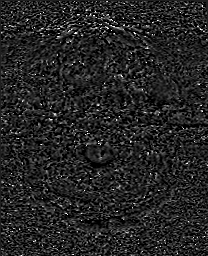
[im 14/55]
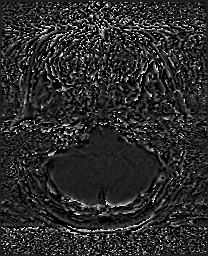
[im 28/55]
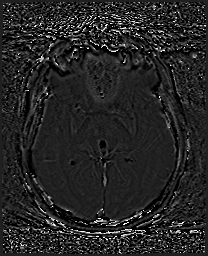
[im 41/55]
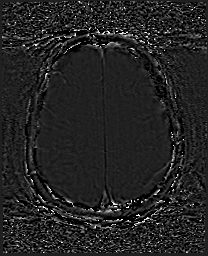
[im 55/55]
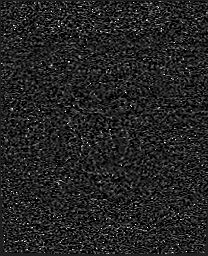

[Series 14: swi_images · axial · 3.0mm · 0.90mm/px · z∈[-83,+91]mm · 6 of 60 slices shown]
[im 1/60]
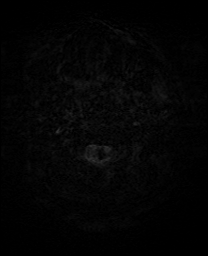
[im 12/60]
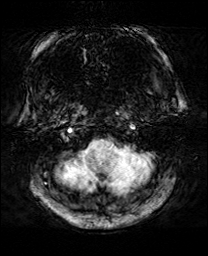
[im 24/60]
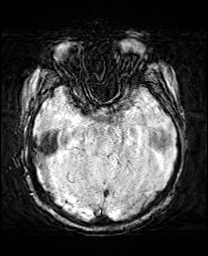
[im 36/60]
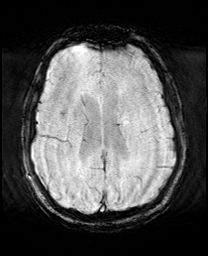
[im 48/60]
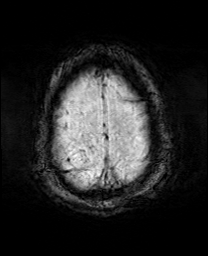
[im 60/60]
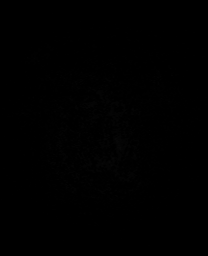

[Series 17: T2 · coronal · 5.0mm · 0.34mm/px · 3 of 29 slices shown (2 of 2)]
[im 1/29]
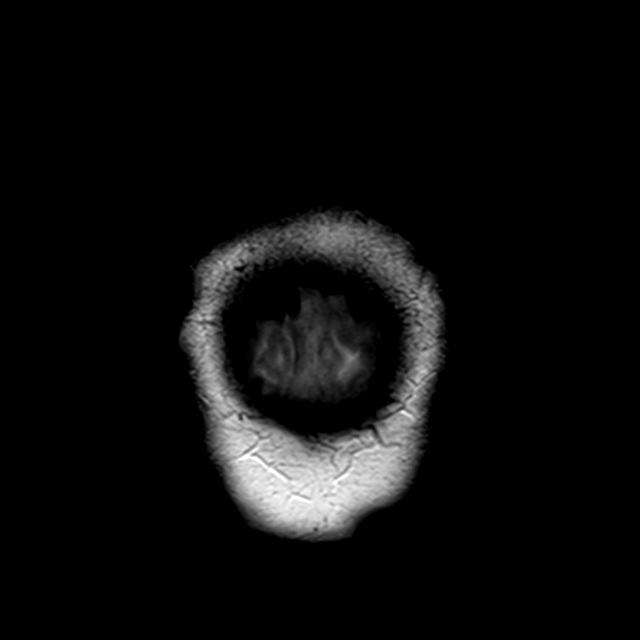
[im 15/29]
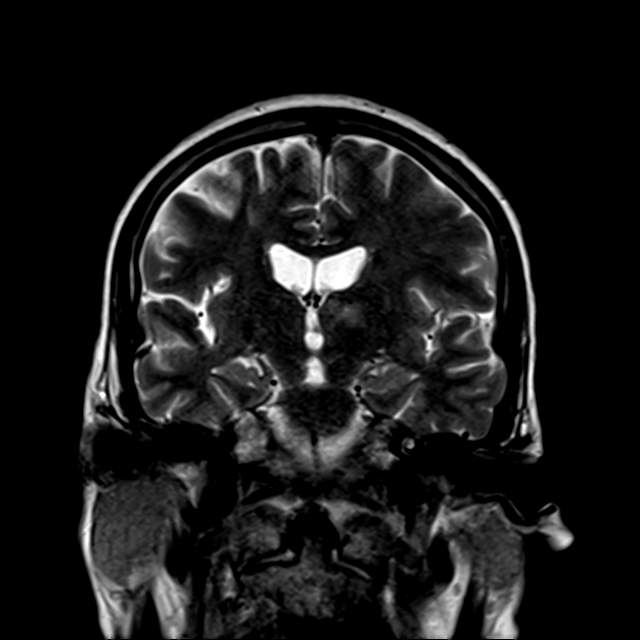
[im 29/29]
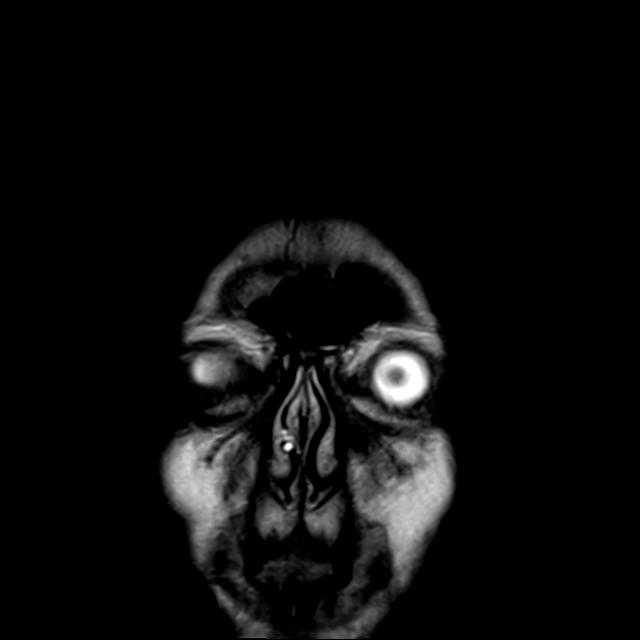

[42 of 48 positions shown; findings below may reference images not displayed]

FINDINGS: Brain: Numerous right cerebellar embolic infarcts are unchanged.
Infarcts of the bilateral pons have slightly increased in size.
Multifocal acute ischemia within both occipital lobes, the left
thalamus, the left caudate nucleus and the right frontal white
matter are unchanged. Multifocal white matter hyperintensity, most
commonly due to chronic ischemic microangiopathy. Normal volume of
CSF spaces. No chronic microhemorrhage. Normal midline structures.

Vascular: Normal flow voids.

Skull and upper cervical spine: Normal marrow signal.

Sinuses/Orbits: Negative.

Other: None.
IMPRESSION: 1. Slight increase in size of bilateral acute infarcts of the pons.
Unchanged infarcts of the left thalamus, left caudate nucleus, right
cerebellum and right frontal white matter.
2. No hemorrhage or mass effect.
3. Findings of chronic ischemic microangiopathy.

## 2019-06-04 IMAGING — CT CT HEAD W/O CM
3 of 4 series · 15 of 47 positions shown, 18 images · non-contrast
Comparison: [DATE] head CT and MRI head.

CLINICAL DATA: Subacute neurological change.

EXAM:
CT HEAD WITHOUT CONTRAST
TECHNIQUE: Contiguous axial images were obtained from the base of the skull
through the vertex without intravenous contrast.

[Series 4: head 2.0 h70h · axial · 0.42mm/px · z∈[-91,+35]mm · 9 of 79 slices shown, 12 images]
[im 8/79  brain]
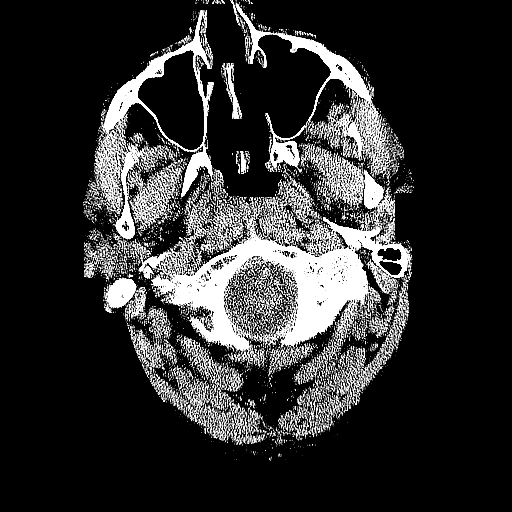
[im 8/79  bone]
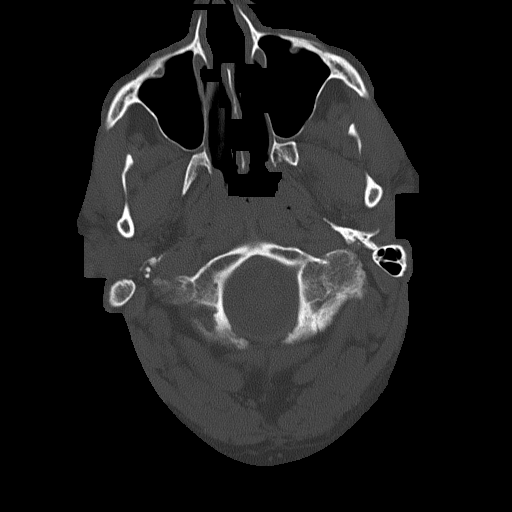
[im 15/79  brain]
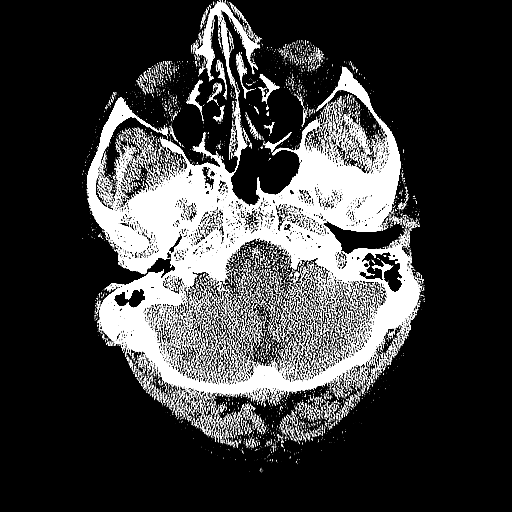
[im 23/79  brain]
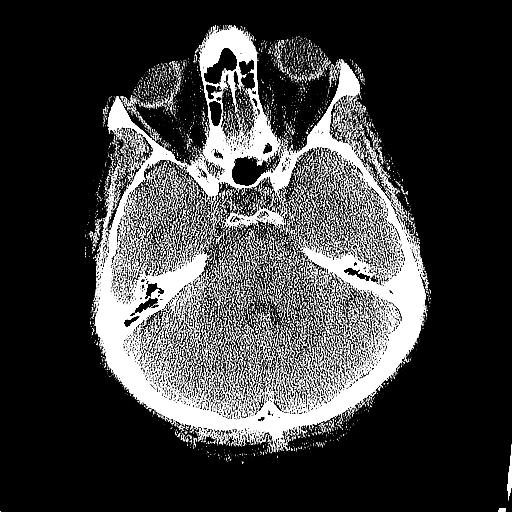
[im 30/79  brain]
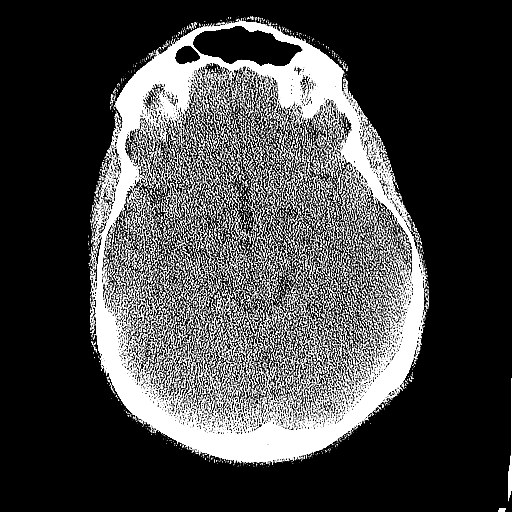
[im 41/79  brain]
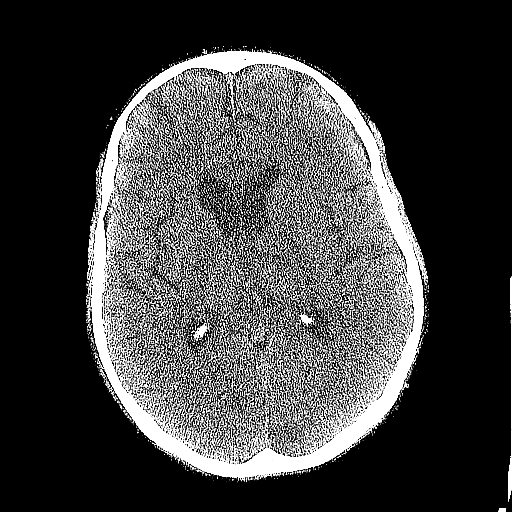
[im 41/79  bone]
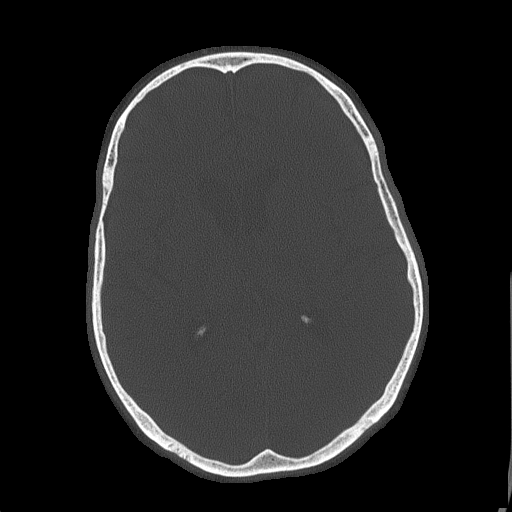
[im 49/79  brain]
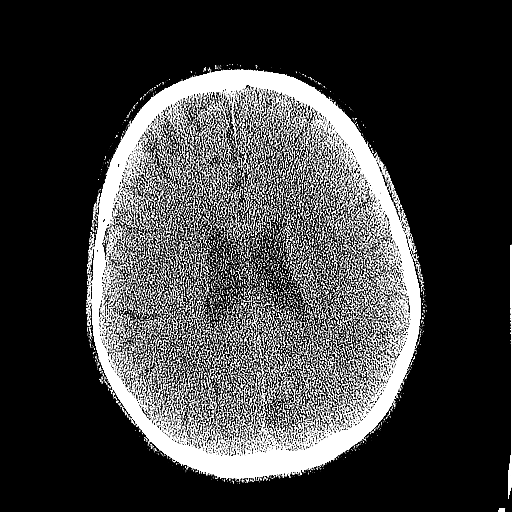
[im 56/79  brain]
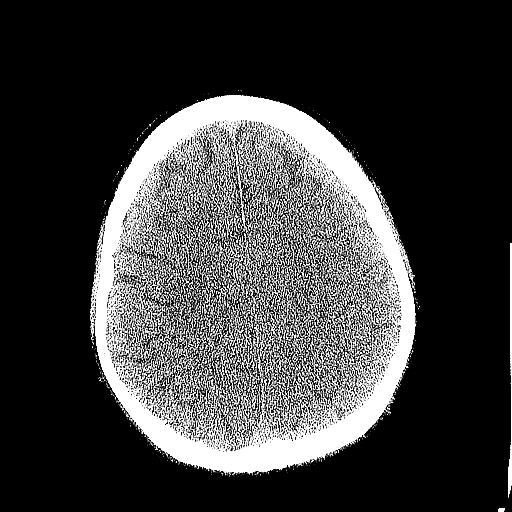
[im 64/79  brain]
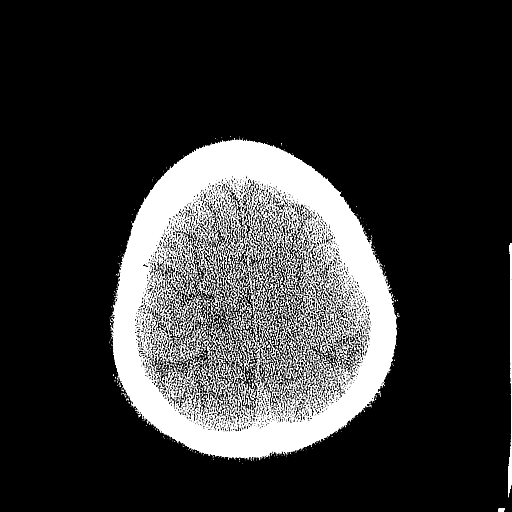
[im 71/79  brain]
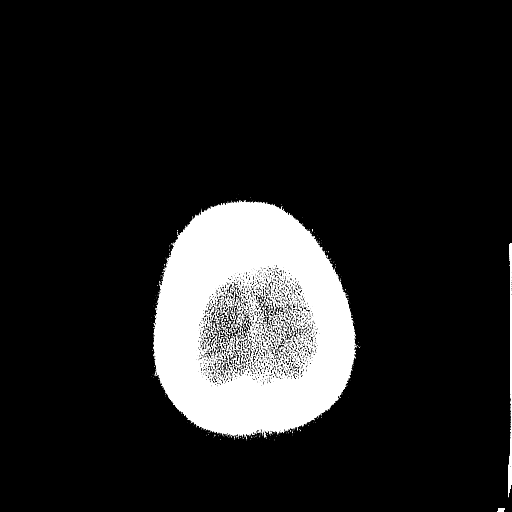
[im 71/79  bone]
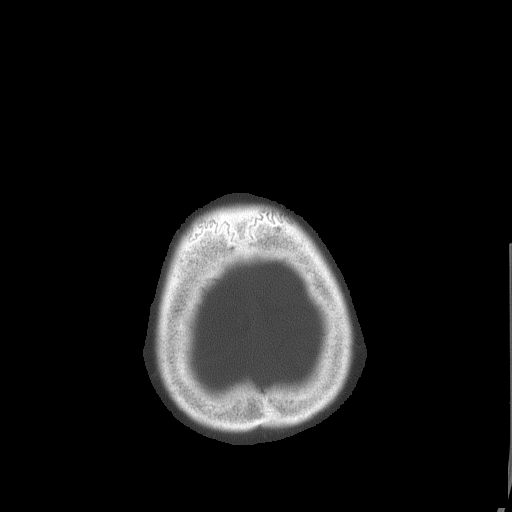

[Series 5: head 3.0 mpr cor · coronal · 0.31mm/px · 3 of 67 slices shown]
[im 26/67  brain]
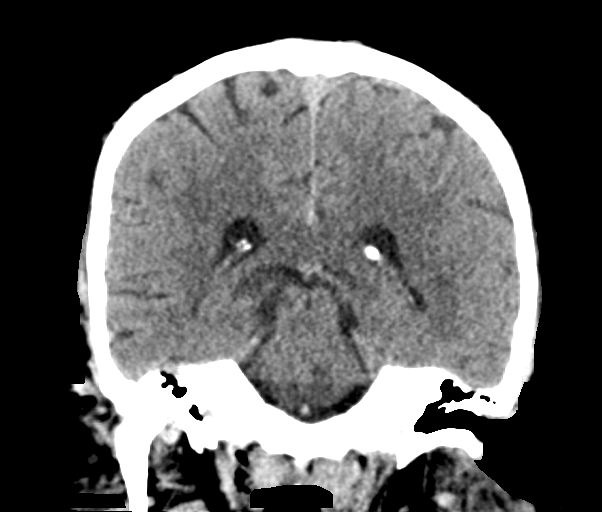
[im 31/67  brain]
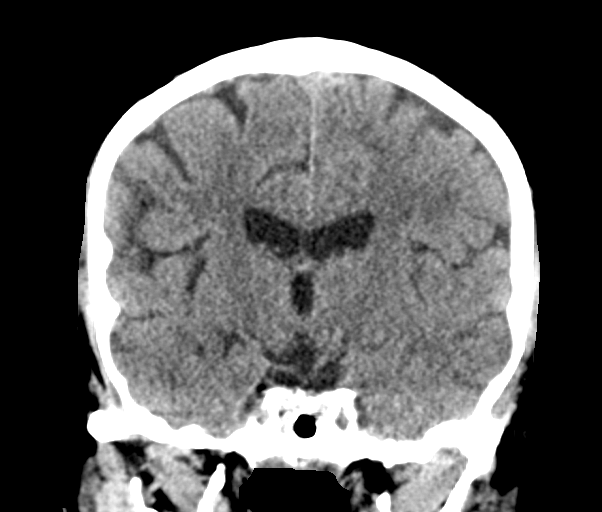
[im 36/67  brain]
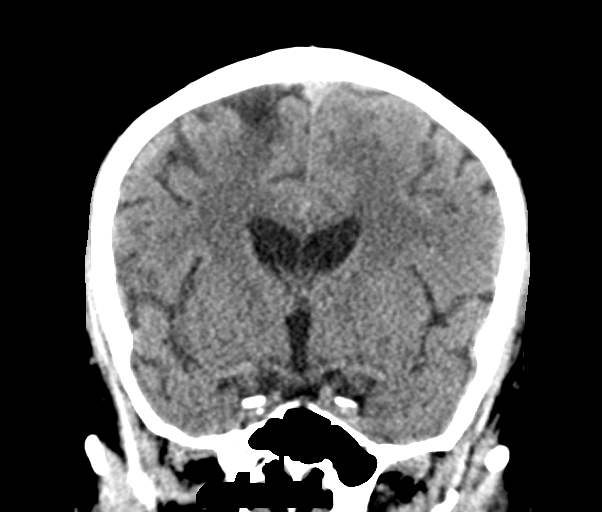

[Series 6: head 3.0 mpr sag · sagittal · 0.32mm/px · 3 of 54 slices shown]
[im 18/54  brain]
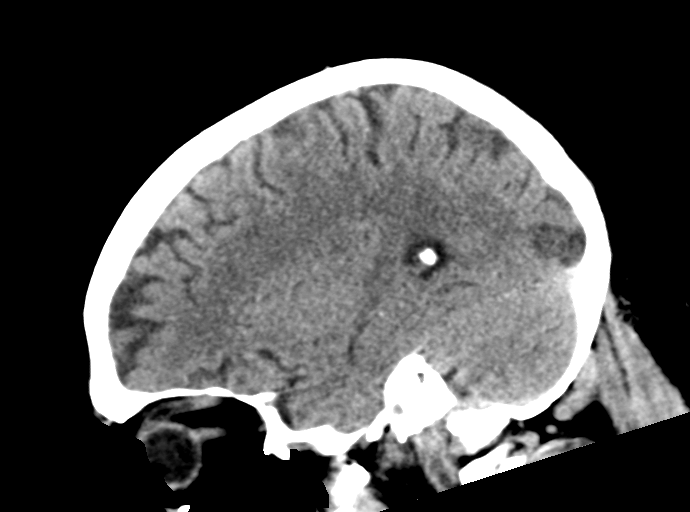
[im 27/54  brain]
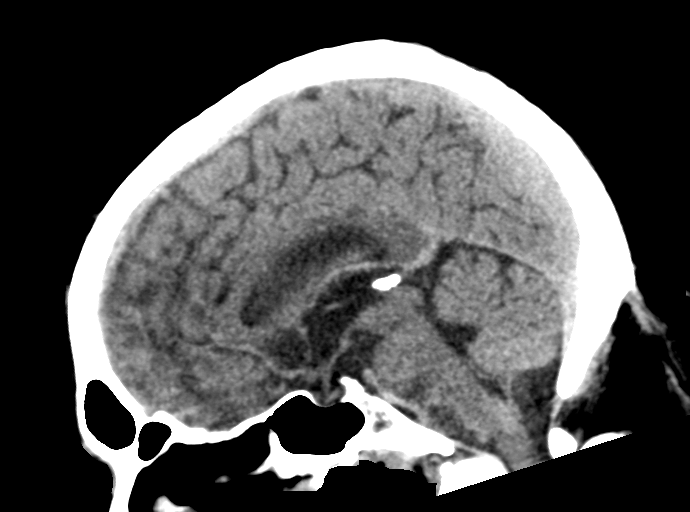
[im 36/54  brain]
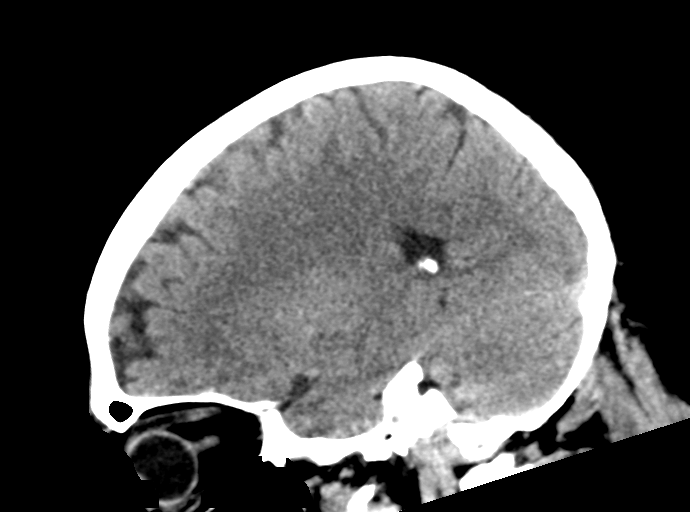

[15 of 47 positions shown; findings below may reference images not displayed]

FINDINGS: Brain: Similar appearance of multifocal bilateral cerebral and right
cerebellar infarcts also involving the left basal ganglia, left
thalamus and pons. No new hypodense foci. No intracranial
hemorrhage. No mass lesion. No midline shift, ventriculomegaly or
extra-axial fluid collection.

Vascular: No hyperdense vessel or unexpected calcification.

Skull: Negative for fracture or focal lesion.

Sinuses/Orbits: Normal orbits. Clear paranasal sinuses. No mastoid
effusion.

Other: None.
IMPRESSION: Multifocal subacute infarcts involving the bilateral cerebrum, right
cerebellum, left basal ganglia/thalamus and pons are unchanged.

No intracranial hemorrhage.

## 2019-06-04 MED ORDER — SODIUM CHLORIDE 0.9 % IV SOLN: 250.0000 mL | INTRAVENOUS | Status: DC

## 2019-06-04 MED ORDER — ASPIRIN 300 MG RE SUPP
300.0000 mg | Freq: Every day | RECTAL | Status: DC
  Filled 2019-06-04 (×2): qty 1

## 2019-06-04 MED ORDER — PANTOPRAZOLE SODIUM 40 MG PO PACK
40.0000 mg | PACK | Freq: Every day | ORAL | Status: DC
  Filled 2019-06-04: qty 20

## 2019-06-04 MED ORDER — PREDNISONE 5 MG PO TABS
10.0000 mg | ORAL_TABLET | Freq: Two times a day (BID) | ORAL | Status: DC
  Administered 2019-06-05 – 2019-06-08 (×7): 10 mg via ORAL
  Filled 2019-06-04: qty 1
  Filled 2019-06-04: qty 2
  Filled 2019-06-04 (×2): qty 1
  Filled 2019-06-04: qty 2
  Filled 2019-06-04 (×2): qty 1

## 2019-06-04 MED ORDER — NOREPINEPHRINE 4 MG/250ML-% IV SOLN
2.0000 ug/min | INTRAVENOUS | Status: DC
  Administered 2019-06-04: 2 ug/min via INTRAVENOUS
  Filled 2019-06-04: qty 250

## 2019-06-04 MED ORDER — ATORVASTATIN CALCIUM 80 MG PO TABS
80.0000 mg | ORAL_TABLET | Freq: Every day | ORAL | Status: DC
  Administered 2019-06-05 – 2019-06-12 (×8): 80 mg via ORAL
  Filled 2019-06-04 (×8): qty 1

## 2019-06-04 MED ORDER — ASPIRIN 325 MG PO TABS
325.0000 mg | ORAL_TABLET | Freq: Every day | ORAL | Status: DC
  Administered 2019-06-05 – 2019-06-12 (×8): 325 mg via ORAL
  Filled 2019-06-04 (×8): qty 1

## 2019-06-04 MED ORDER — BUPROPION HCL 75 MG PO TABS
150.0000 mg | ORAL_TABLET | Freq: Two times a day (BID) | ORAL | Status: DC
  Administered 2019-06-06 – 2019-06-12 (×13): 150 mg via ORAL
  Filled 2019-06-04 (×14): qty 2

## 2019-06-04 MED ORDER — PANTOPRAZOLE SODIUM 40 MG PO TBEC: 40.0000 mg | DELAYED_RELEASE_TABLET | Freq: Every day | ORAL | Status: DC

## 2019-06-04 MED ORDER — CLOPIDOGREL BISULFATE 75 MG PO TABS
75.0000 mg | ORAL_TABLET | Freq: Every day | ORAL | Status: DC
  Administered 2019-06-05 – 2019-06-12 (×8): 75 mg via ORAL
  Filled 2019-06-04 (×8): qty 1

## 2019-06-04 MED ORDER — SODIUM CHLORIDE 0.9 % IV BOLUS
1000.0000 mL | Freq: Once | INTRAVENOUS | Status: AC
  Administered 2019-06-04: 1000 mL via INTRAVENOUS

## 2019-06-04 MED ORDER — QUETIAPINE FUMARATE 25 MG PO TABS: 25.0000 mg | ORAL_TABLET | Freq: Three times a day (TID) | ORAL | Status: DC | PRN

## 2019-06-04 MED ORDER — BUPROPION HCL 100 MG PO TABS
150.0000 mg | ORAL_TABLET | Freq: Every day | ORAL | Status: AC
  Administered 2019-06-05: 150 mg via ORAL
  Filled 2019-06-04: qty 2

## 2019-06-04 MED ORDER — GABAPENTIN 600 MG PO TABS
600.0000 mg | ORAL_TABLET | Freq: Three times a day (TID) | ORAL | Status: DC
  Administered 2019-06-05 – 2019-06-07 (×7): 600 mg via ORAL
  Filled 2019-06-04 (×11): qty 1

## 2019-06-04 MED ORDER — TRAMADOL HCL 50 MG PO TABS
50.0000 mg | ORAL_TABLET | Freq: Four times a day (QID) | ORAL | Status: DC | PRN
  Administered 2019-06-06 – 2019-06-12 (×5): 50 mg via ORAL
  Filled 2019-06-04 (×7): qty 1

## 2019-06-04 MED ORDER — SODIUM CHLORIDE 0.9 % IV SOLN: INTRAVENOUS | Status: DC

## 2019-06-04 MED ORDER — ACETAMINOPHEN 325 MG PO TABS
650.0000 mg | ORAL_TABLET | Freq: Four times a day (QID) | ORAL | Status: DC
  Administered 2019-06-05 – 2019-06-12 (×21): 650 mg via ORAL
  Filled 2019-06-04 (×24): qty 2

## 2019-06-04 MED ORDER — GUAIFENESIN-DM 100-10 MG/5ML PO SYRP
5.0000 mL | ORAL_SOLUTION | ORAL | Status: DC | PRN
  Administered 2019-06-06 – 2019-06-10 (×2): 5 mL via ORAL
  Filled 2019-06-04 (×2): qty 5

## 2019-06-04 MED ORDER — HEPARIN (PORCINE) 25000 UT/250ML-% IV SOLN
1150.0000 [IU]/h | INTRAVENOUS | Status: DC
  Administered 2019-06-04: 900 [IU]/h via INTRAVENOUS
  Administered 2019-06-05 – 2019-06-06 (×2): 950 [IU]/h via INTRAVENOUS
  Administered 2019-06-08: 1000 [IU]/h via INTRAVENOUS
  Administered 2019-06-09: 1150 [IU]/h via INTRAVENOUS
  Filled 2019-06-04 (×5): qty 250

## 2019-06-04 MED ORDER — PANTOPRAZOLE SODIUM 40 MG IV SOLR
40.0000 mg | INTRAVENOUS | Status: DC
  Administered 2019-06-04: 40 mg via INTRAVENOUS
  Filled 2019-06-04: qty 40

## 2019-06-04 MED ORDER — ASPIRIN 325 MG PO TABS: 325.0000 mg | ORAL_TABLET | Freq: Every day | ORAL | Status: DC

## 2019-06-04 NOTE — Progress Notes (Signed)
Family is very concerned about patient's pain control. They state he takes Tylenol at home but it does not work. Pt is now NPO after his neuro change and unable to get his Neurontin. Pt's son concerned that pt did not receive these meds. Pt is resting and does not seem in any pain at this time. RN told family that if pain becomes an issue they would call an MD and that if needed, an NG tube could be placed for med admin. Ashyia Schraeder, [place] Scrape, RN

## 2019-06-04 NOTE — Progress Notes (Signed)
STROKE TEAM PROGRESS NOTE   INTERVAL HISTORY Patient was awake and interactive during a.m. rounds but had significant dysarthria and was able to move all 4 extremities with weakness on the right side.  Subsequently went for swallow eval and was cleared for dysphagia 1 diet.  At 1400 hrs. patient was found to be nonverbal with generalized weakness with relatively low blood pressure of 100/64.  He was laid flat and Levophed drip was restarted with systolic goal greater than 120.  A stat head CT was obtained which I personally reviewed showed no acute abnormalities and multifocal subacute infarcts involving bilateral cerebellum left basal ganglia thalamus and pons which are unchanged.  Patient was given 1 L IV fluid bolus and condition gradually improved.  I also met with patient's family members at the bedside. Vitals:   06/04/19 1500 06/04/19 1515 06/04/19 1530 06/04/19 1600  BP: (!) 178/74 (!) 155/75 (!) 173/78 (!) 142/74  Pulse: 63 (!) 57 68 60  Resp: _0 Temp:    98.4 F (36.9 C)  TempSrc:    Oral  SpO2: 96% 93% 95% 91%  Weight:      Height:        CBC:  Recent Labs  Lab 06/02/19 1058 06/02/19 1058 06/03/19 0657 06/04/19 0541  WBC 21.3*   < > 23.5* 19.9*  NEUTROABS 13.8*  --   --   --   HGB 17.0   < > 15.5 16.7  HCT 50.6   < > 44.6 48.0  MCV 90.0   < > 87.6 87.6  PLT 293   < > 254 210   < > = values in this interval not displayed.    Basic Metabolic Panel:  Recent Labs  Lab 06/03/19 0657 06/04/19 0541  NA 137 140  K 3.3* 3.8  CL 104 109  CO2 19* 18*  GLUCOSE 107* 112*  BUN 14 15  CREATININE 0.86 0.87  CALCIUM 8.5* 8.3*  MG  --  2.1   Lipid Panel:     Component Value Date/Time   CHOL 221 (H) 06/02/2019 1830   TRIG 414 (H) 06/02/2019 1830   HDL 40 (L) 06/02/2019 1830   CHOLHDL 5.5 06/02/2019 1830   VLDL UNABLE TO CALCULATE IF TRIGLYCERIDE OVER 400 mg/dL 06/02/2019 1830   LDLCALC UNABLE TO CALCULATE IF TRIGLYCERIDE OVER 400 mg/dL 06/02/2019 1830    HgbA1c:  Lab Results  Component Value Date   HGBA1C 5.8 (H) 06/02/2019   Urine Drug Screen:     Component Value Date/Time   LABOPIA NONE DETECTED 06/02/2019 1400   COCAINSCRNUR NONE DETECTED 06/02/2019 1400   LABBENZ NONE DETECTED 06/02/2019 1400   AMPHETMU NONE DETECTED 06/02/2019 1400   THCU NONE DETECTED 06/02/2019 1400   LABBARB NONE DETECTED 06/02/2019 1400    Alcohol Level     Component Value Date/Time   ETH <10 06/02/2019 1058    IMAGING past 24 hours CT HEAD WO CONTRAST  Result Date: 06/04/2019 CLINICAL DATA:  Subacute neurological change. EXAM: CT HEAD WITHOUT CONTRAST TECHNIQUE: Contiguous axial images were obtained from the base of the skull through the vertex without intravenous contrast. COMPARISON:  06/04/2019 head CT and MRI head. FINDINGS: Brain: Similar appearance of multifocal bilateral cerebral and right cerebellar infarcts also involving the left basal ganglia, left thalamus and pons. No new hypodense foci. No intracranial hemorrhage. No mass lesion. No midline shift, ventriculomegaly or extra-axial fluid collection. Vascular: No hyperdense vessel or unexpected calcification. Skull: Negative for fracture or  focal lesion. Sinuses/Orbits: Normal orbits. Clear paranasal sinuses. No mastoid effusion. Other: None. IMPRESSION: Multifocal subacute infarcts involving the bilateral cerebrum, right cerebellum, left basal ganglia/thalamus and pons are unchanged. No intracranial hemorrhage. Electronically Signed   By: Primitivo Gauze M.D.   On: 06/04/2019 15:03   CT HEAD WO CONTRAST  Result Date: 06/04/2019 CLINICAL DATA:  Stroke follow-up EXAM: CT HEAD WITHOUT CONTRAST TECHNIQUE: Contiguous axial images were obtained from the base of the skull through the vertex without intravenous contrast. COMPARISON:  Brain MRI 06/02/2019 FINDINGS: Brain: Unchanged appearance of multifocal subacute infarcts scattered throughout the brain, involving both hemispheres, multiple vascular  territories and the cerebellum. No hemorrhage or mass effect. Vascular: No hyperdense vessel or unexpected calcification. Skull: Normal. Negative for fracture or focal lesion. Sinuses/Orbits: No acute finding. Other: None. IMPRESSION: 1. Unchanged appearance of multifocal subacute infarcts scattered throughout the brain, involving both hemispheres and the cerebellum. 2. No hemorrhage or mass effect. Electronically Signed   By: Ulyses Jarred M.D.   On: 06/04/2019 00:09   MR BRAIN WO CONTRAST  Result Date: 06/04/2019 CLINICAL DATA:  Stroke follow-up EXAM: MRI HEAD WITHOUT CONTRAST TECHNIQUE: Multiplanar, multiecho pulse sequences of the brain and surrounding structures were obtained without intravenous contrast. COMPARISON:  Head CT 06/03/2019 Brain MRI 06/02/2019 FINDINGS: Brain: Numerous right cerebellar embolic infarcts are unchanged. Infarcts of the bilateral pons have slightly increased in size. Multifocal acute ischemia within both occipital lobes, the left thalamus, the left caudate nucleus and the right frontal white matter are unchanged. Multifocal white matter hyperintensity, most commonly due to chronic ischemic microangiopathy. Normal volume of CSF spaces. No chronic microhemorrhage. Normal midline structures. Vascular: Normal flow voids. Skull and upper cervical spine: Normal marrow signal. Sinuses/Orbits: Negative. Other: None. IMPRESSION: 1. Slight increase in size of bilateral acute infarcts of the pons. Unchanged infarcts of the left thalamus, left caudate nucleus, right cerebellum and right frontal white matter. 2. No hemorrhage or mass effect. 3. Findings of chronic ischemic microangiopathy. Electronically Signed   By: Ulyses Jarred M.D.   On: 06/04/2019 00:55   DG Swallowing Func-Speech Pathology  Result Date: 06/04/2019 Objective Swallowing Evaluation: Type of Study: MBS-Modified Barium Swallow Study  Patient Details Name: Adrian [name] MRN: [id number] Date of Birth: 29-Jul-1965 Today's Date:  06/04/2019 Time: SLP Start Time (ACUTE ONLY): 1010 -SLP Stop Time (ACUTE ONLY): 1040 SLP Time Calculation (min) (ACUTE ONLY): 30 min Past Medical History: Past Medical History: Diagnosis Date . Tobacco abuse  Past Surgical History: No past surgical history on file. HPI: This is a 54 year old gentleman with sudden onset of dizziness followed by dysarthria and right-sided weakness.  MRI confirms both supratentorial and infratentorial bilateral scattered infarcts.  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 MRI brain-numerous small supratentorial and infratentorial acute infarcts involving the cerebral hemispheres, left thalamus, pons and right cerebellar hemisphere.  No data recorded Assessment / Plan / Recommendation CHL IP CLINICAL IMPRESSIONS 06/04/2019 Clinical Impression Pt presents with a primary moderate to severe oral dysphagia due to poor lingual fine motor coordination. Primary problem with the ability to both acheive posterior oral seal to prevent spillage to pharynx while also propelling bolus with appropriate propulsion as well as the inability to use to the tongue to manipulate a solid bolus for mastication. USe of nectar thick liquids improves time and cohesion of the bolus to reduce premature spillage and avoid delayed swallow intiation. With thins  however there is posterior spillage of the bolus during propulsion efforts with sensed aspiration as laryngeal elevation initiates. Pt showed promise with a chin tuck maneuver (head turns ineffective), but due to intermittent crying behavior (PBA) pt could not sustain attention for thorough trials. Will attempt a chin tuck with thin at the bedside. For now, pt to initaite nectar thick liquids and pureed solids, preferably with consistent use of a straw. SLP Visit Diagnosis Dysphagia, oral phase (R13.11);Dysphagia, oropharyngeal phase (R13.12) Attention and  concentration deficit following -- Frontal lobe and executive function deficit following -- Impact on safety and function Moderate aspiration risk   CHL IP TREATMENT RECOMMENDATION 06/04/2019 Treatment Recommendations Therapy as outlined in treatment plan below   No flowsheet data found. CHL IP DIET RECOMMENDATION 06/04/2019 SLP Diet Recommendations Dysphagia 1 (Puree) solids;Nectar thick liquid Liquid Administration via Straw Medication Administration Crushed with puree Compensations -- Postural Changes Remain semi-upright after after feeds/meals (Comment);Seated upright at 90 degrees   CHL IP OTHER RECOMMENDATIONS 06/04/2019 Recommended Consults -- Oral Care Recommendations Oral care BID Other Recommendations --   CHL IP FOLLOW UP RECOMMENDATIONS 06/04/2019 Follow up Recommendations Inpatient Rehab   CHL IP FREQUENCY AND DURATION 06/04/2019 Speech Therapy Frequency (ACUTE ONLY) min 2x/week Treatment Duration 2 weeks      CHL IP ORAL PHASE 06/04/2019 Oral Phase Impaired Oral - Pudding Teaspoon -- Oral - Pudding Cup -- Oral - Honey Teaspoon -- Oral - Honey Cup -- Oral - Nectar Teaspoon Decreased bolus cohesion Oral - Nectar Cup -- Oral - Nectar Straw WFL Oral - Thin Teaspoon -- Oral - Thin Cup -- Oral - Thin Straw Decreased bolus cohesion;Premature spillage;Delayed oral transit Oral - Puree Delayed oral transit;WFL Oral - Mech Soft -- Oral - Regular Weak lingual manipulation;Impaired mastication Oral - Multi-Consistency -- Oral - Pill -- Oral Phase - Comment --  CHL IP PHARYNGEAL PHASE 06/04/2019 Pharyngeal Phase Impaired Pharyngeal- Pudding Teaspoon -- Pharyngeal -- Pharyngeal- Pudding Cup -- Pharyngeal -- Pharyngeal- Honey Teaspoon -- Pharyngeal -- Pharyngeal- Honey Cup -- Pharyngeal -- Pharyngeal- Nectar Teaspoon Lateral channel residue Pharyngeal -- Pharyngeal- Nectar Cup -- Pharyngeal -- Pharyngeal- Nectar Straw Lateral channel residue Pharyngeal -- Pharyngeal- Thin Teaspoon -- Pharyngeal -- Pharyngeal- Thin Cup  Lateral channel residue;Penetration/Aspiration during swallow;Delayed swallow initiation-pyriform sinuses Pharyngeal Material enters airway, passes BELOW cords and not ejected out despite cough attempt by patient Pharyngeal- Thin Straw Lateral channel residue;Penetration/Aspiration before swallow;Delayed swallow initiation-pyriform sinuses Pharyngeal Material enters airway, passes BELOW cords and not ejected out despite cough attempt by patient Pharyngeal- Puree Lateral channel residue Pharyngeal -- Pharyngeal- Mechanical Soft -- Pharyngeal -- Pharyngeal- Regular Lateral channel residue Pharyngeal -- Pharyngeal- Multi-consistency -- Pharyngeal -- Pharyngeal- Pill -- Pharyngeal -- Pharyngeal Comment --  No flowsheet data found. Herbie [place], MA CCC-SLP Acute Rehabilitation Services Pager [phone] Office [phone] Lynann Beaver 06/04/2019, 11:28 AM               PHYSICAL EXAM Constitutional: Appears well-developed and well-nourished. Middle aged 69 male Mild-mod distress Eyes: No scleral injection, unequal pupils HENT: No OP obstrucion Head: Normocephalic.  Cardiovascular: Normal rate and regular rhythm.  Respiratory: Effort normal, non-labored breathing GI: Soft. No distension. There is no tenderness.  Skin: WDI  Neuro: Mental Status: Patient is drowsy but awakens easily.Speech-severely dysarthric but not aphasic.  He is able to follow commands.  He is able to name and repeat.  Cranial Nerves: II: Visual Fields are full.  III,IV, VI: EOMI with saccadic pursuit but without ptosis or diploplia. Pupils  unequal with the right being 2 mm and the left being 4 mm, round and reactive to light V: Facial sensation is symmetric to temperature VII: Facial movement with right weakness VIII: hearing is intact to voice X: Palat elevates symmetrically XI: Shoulder shrug is symmetric. XII: tongue is midline without atrophy or fasciculations.  Motor: Normal tone and bulk -Unable  to lift right arm off bed giving him 3/5 proximally with distally 4/5.  4/5 left upper extremity.  Left lower extremity 5/5 and right lower extremity 4/5, it takes a great deal of encouragement in his right leg due to back pain.  With repeated encouragement, he is able to hold his right leg about an inch off the bed with only mild drift. Sensory: Sensation is symmetric to light touch and temperature in the arms and legs. DSS Deep Tendon Reflexes: 3+ and symmetric in the biceps and patellae.  Plantars: Upgoing toes bilaterally Cerebellar: Finger-to-nose showed no dysmetria on the left, unable to do on the right. HKS with difficulty on the left, refuses on the right.   ASSESSMENT/PLAN [name] is a 54 y.o. male with history of back pain, and very heavy tobacco smoking, presenting with dizziness, ataxia, right side weakness and severe dysarthria. He was outside of window for IV tPA and there was no LVO.   Stroke: multiple multifocal embolic infarcts mostly in posterior circulation but if you in bilateral anterior circulation white matter as well  Code Stroke multifocal areas of hypoattenuation scattered throughout the bilateral supratentorial brain and right cerebellum.  There may be mild associated vasogenic edema with a high right parietal lesion.  CTA head distal Rt V4 thrombus, Rt PICA with multifocal thrombus, bilat intracranial stenosis  CTA neck  Occlusive thrombus in innominate, bilat ICAs are not stenotic. Soft plaque in bilat vert arteries, eccentric endoluminal  thrombus in aortic arch.   MRI  numerous small supratentorial and infratentorial acute infarcts involving the cerebral hemispheres, left thalamus, pons and right cerebellar hemisphere.  This exam suggestive of embolic process.  2D Echo: pending  LDL UNABLE TO CALCULATE IF TRIGLYCERIDE OVER 400 mg/dL  HgbA1c 5.8  Lovenox for VTE prophylaxis    Diet   Diet NPO time specified    None prior to admission, now  on ASA + Plavix (after Plavix load)  Therapy recommendations:  pending  Disposition:  pending  No dx of Hypertension . Permissive hypertension (OK if < 220/120) but gradually normalize in 5-7 days . Long-term BP goal normotensive; avoid hypotension . Levophed used initially to increase SBP over 120  Hyperlipidemia  Home meds: none  LDL UNABLE TO CALCULATE IF TRIGLYCERIDE OVER 400 mg/dL, goal < 70  Add max dose Lipitor  Continue statin at discharge   NO dx of Diabetes  HgbA1c 5.8, goal < 7.0  CBGs Recent Labs    06/02/19 1034 06/02/19 1045  GLUCAP 105* 108*    Other Stroke Risk Factors  Very heavy Cigarette smoker (4ppd); advised to stop smoking  Other Active Problems  Anxiety, threatening to leave AMA- he is on max dose nicotine patch to cut cravings. We have addressed back pain. Will add PRN Seroquel for now and after d/w pharmacy, we will start Bupropion 146m daily x3d, then BID to help with cessation down the line. Pt is not safe to leave AMA at this time. He has considerable stroke deficits. I have updated w/RN.   Severe chronic back pain and neuropathy- continue home gabapentin, steroids  Hospital day # 2  Patient had transient neurological worsening in the setting of relative hypotension earlier this afternoon but improved with IV hydration and induced hypertension.  Continue aspirin and Plavix and start IV heparin drip as well.  Continue induced hypertension with systolic blood pressure goal below 120. Long discussion with the patient and daughter and son at the bedside about his prognosis, plan of care and answered questions. This patient is critically ill and at significant risk of neurological worsening, death and care requires constant monitoring of vital signs, hemodynamics,respiratory and cardiac monitoring, extensive review of multiple databases, frequent neurological assessment, discussion with family, other specialists and medical decision making of  high complexity.I have made any additions or clarifications directly to the above note.This critical care time does not reflect procedure time, or teaching time or supervisory time of PA/NP/Med Resident etc but could involve care discussion time.  I spent 35 minutes of neurocritical care time  in the care of  this patient.          Antony Contras, MD Medical Director [facility] Stroke Center Pager: [phone] 06/04/2019 5:24 PM  To contact Stroke Continuity provider, please refer to [email or web address] After hours, contact General Neurology

## 2019-06-04 NOTE — Progress Notes (Signed)
PT Cancellation Note  Patient Details Name: Adrian [name] MRN: [id number] DOB: 1965-10-05   Cancelled Treatment:    Reason Eval/Treat Not Completed: Active bedrest order.  Pt on strict bedrest after events of last night.  PT will check back on Monday to see if he is allowed to proceed with mobility.  Thanks,  Corinna Capra, PT, DPT  Acute Rehabilitation [phone] pager #[phone] office       [name] 06/04/2019, 1:37 PM

## 2019-06-04 NOTE — Consult Note (Signed)
Physical Medicine and Rehabilitation Consult Reason for Consult: Weakness and dysarthria Referring Physician: Dr. Pearlean Brownie   HPI: Adrian [name] is a 54 y.o. right-handed male with history of tobacco abuse and chronic low back pain.  History taken from chart review, wife, and son due to cognition.  Independent prior to admission.  He presented 06/02/19 with dysarthria weakness.  Admission chemistries with WBC 21,000, BUN 24, urine drug screen negative.  CT showed multiple bilateral infarcts.  Per report, numerous small supratentorial and infratentorial acute infarcts involving the cerebral hemispheres, left thalamus, pons and right cerebellar hemisphere.  Chronic right frontoparietal cortical infarcts.  Follow-up MRI personally reviewed, showing increase in size of brainstem infarcts.  Patient did not receive TPA.  CT angiogram head and neck occlusive thrombus within the distal V4 right vertebral artery with occlusive thrombus extending into the proximal and mid basilar artery.  Occlusive thrombus also suspected within the very distal V4 left vertebral artery.  No anterior circulation large vessel occlusion or proximal high-grade stenosis identified.  Echocardiogram showed ejection fraction of 70% with grade 1 diastolic dysfunction.  EEG negative for seizures.  Currently maintained on aspirin and Plavix for CVA prophylaxis.  Subcutaneous Lovenox for DVT prophylaxis.  Hospital course complicated by dysphagia, started on dysphagia 1 nectar thick liquid diet.  Therapy evaluations completed with recommendations of physical medicine rehab consult.  Review of Systems  Unable to perform ROS: Mental acuity   Past Medical History:  Diagnosis Date  . Tobacco abuse    History reviewed. No pertinent surgical history. Family History  Problem Relation Age of Onset  . Hypertension Mother   . Hypertension Father    Social History:  reports that he has been smoking cigarettes. He has been smoking about 4.00  packs per day. He does not have any smokeless tobacco history on file. No history on file for alcohol and drug. Allergies:  Allergies  Allergen Reactions  . Codeine Other (See Comments)    unknown  . Motrin [Ibuprofen] Other (See Comments)    unknown  . Penicillins Other (See Comments)    unknown   Medications Prior to Admission  Medication Sig Dispense Refill  . gabapentin (NEURONTIN) 600 MG tablet Take 600 mg by mouth 3 (three) times daily.    . pantoprazole (PROTONIX) 40 MG tablet Take 40 mg by mouth daily.    . predniSONE (DELTASONE) 10 MG tablet Take 10 mg by mouth See admin instructions. 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  Light mod assist.  Pt needed help with his legs, pulled with bil hands to help support trunk.  Transfers Overall transfer level: Needs assistance Equipment used: 2 person hand held assist Transfers: Sit to/from Stand, Stand Pivot Transfers Sit to Stand: +2 physical assistance, Mod assist Stand pivot transfers: +  2 physical assistance, Mod assist General transfer comment: Two person mod assist to stand and pivot to pt's left from bed to chair. Pt able to take pivotal steps with bil LEs blocked. R leg  buckling more and needed more assist to step and progress leg around.        ADL: ADL Overall ADL's : Needs assistance/impaired Eating/Feeding: NPO Grooming: Wash/dry face, Minimal assistance, Sitting Upper Body Bathing: Moderate assistance, Sitting Lower Body Bathing: Maximal assistance, Sit to/from stand, +2 for safety/equipment, +2 for physical assistance Upper Body Dressing : Moderate assistance, Sitting Lower Body Dressing: Maximal assistance, +2 for physical assistance, +2 for safety/equipment, Sit to/from stand Toilet Transfer: +2 for physical assistance, +2 for safety/equipment, Stand-pivot, Moderate assistance Functional mobility during ADLs: Moderate assistance, +2 for physical assistance, +2 for safety/equipment(stand pivot)  Cognition: Cognition Overall Cognitive Status: Impaired/Different from baseline Orientation Level: Oriented to person, Oriented to place, Disoriented to time, Disoriented to situation Cognition Arousal/Alertness: Awake/alert Behavior During Therapy: Impulsive Overall Cognitive Status: Impaired/Different from baseline Area of Impairment: Orientation, Attention, Memory, Following commands, Safety/judgement, Awareness, Problem solving Orientation Level: (Pt becoming frustrated and avoiding orientation questions) Current Attention Level: Sustained Memory: Decreased recall of precautions, Decreased short-term memory Following Commands: Follows one step commands with increased time Safety/Judgement: Decreased awareness of safety, Decreased awareness of deficits Awareness: Intellectual Problem Solving: Difficulty sequencing, Requires verbal cues, Requires tactile cues General Comments: Pt emotionally labile, frustrated by speech difficulties, decreased awareness of his deficits and how it realates to safety.  "Can you get back to the bed safely by yourself?" "Yep".  Blood pressure (!) 164/73, pulse 70, temperature 98.7 F (37.1 C), temperature source Oral,  resp. rate (!) 27, height 5\' 7"  (1.702 m), weight 71.1 kg, SpO2 96 %. Physical Exam  Vitals reviewed. Constitutional: He appears well-developed and well-nourished.  HENT:  Head: Normocephalic and atraumatic.  Eyes: Right eye exhibits no discharge. Left eye exhibits no discharge.  ?  Right gaze preference  Neck: No tracheal deviation present. No thyromegaly present.  Respiratory: Effort normal. No respiratory distress.  GI: Soft. He exhibits no distension.  Musculoskeletal:     Comments: No edema or tenderness in extremities  Neurological:  Patient is alert.   Makes eye contact with examiner.   Nonverbal and not following any commands  Skin: Skin is warm and dry.  Psychiatric:  Unable to assess due to mentation    Results for orders placed or performed during the hospital encounter of 06/02/19 (from the past 24 hour(s))  Basic metabolic panel     Status: Abnormal   Collection Time: 06/04/19  5:41 AM  Result Value Ref Range   Sodium 140 135 - 145 mmol/L   Potassium 3.8 3.5 - 5.1 mmol/L   Chloride 109 98 - 111 mmol/L   CO2 18 (L) 22 - 32 mmol/L   Glucose, Bld 112 (H) 70 - 99 mg/dL   BUN 15 6 - 20 mg/dL   Creatinine, Ser 7.82 0.61 - 1.24 mg/dL   Calcium 8.3 (L) 8.9 - 10.3 mg/dL   GFR calc non Af Amer >60 >60 mL/min   GFR calc Af Amer >60 >60 mL/min   Anion gap 13 5 - 15  CBC     Status: Abnormal   Collection Time: 06/04/19  5:41 AM  Result Value Ref Range   WBC 19.9 (H) 4.0 - 10.5 K/uL   RBC 5.48 4.22 - 5.81 MIL/uL   Hemoglobin 16.7 13.0 - 17.0 g/dL   HCT 95.6 21.3 -  52.0 %   MCV 87.6 80.0 - 100.0 fL   MCH 30.5 26.0 - 34.0 pg   MCHC 34.8 30.0 - 36.0 g/dL   RDW 10.6 26.9 - 48.5 %   Platelets 210 150 - 400 K/uL   nRBC 0.0 0.0 - 0.2 %  Magnesium     Status: None   Collection Time: 06/04/19  5:41 AM  Result Value Ref Range   Magnesium 2.1 1.7 - 2.4 mg/dL   CT ANGIO HEAD W OR WO CONTRAST  Result Date: 06/02/2019 CLINICAL DATA:  Stroke suspected. EXAM: CT ANGIOGRAPHY  HEAD AND NECK TECHNIQUE: Multidetector CT imaging of the head and neck was performed using the standard protocol during bolus administration of intravenous contrast. Multiplanar CT image reconstructions and MIPs were obtained to evaluate the vascular anatomy. Carotid stenosis measurements (when applicable) are obtained utilizing NASCET criteria, using the distal internal carotid diameter as the denominator. CONTRAST:  OMNIPAQUE IOHEXOL 350 MG/ML SOLN COMPARISON:  Brain MRI 06/02/2019. FINDINGS: CTA NECK FINDINGS Aortic arch: Standard aortic branching. There is an eccentric curvilinear filling defect within the lumen of aortic arch measuring 2.0 x 4.7 cm (series 7, image 344). Findings likely reflect eccentric thrombus. Additionally, there is occlusive thrombus within the proximal to mid innominate artery. There is reconstitution of flow at the level of the distal innominate artery. No significant proximal subclavian stenosis. Right carotid system: CCA and ICA patent within the neck without significant stenosis (50% or greater). Left carotid system: CCA and ICA patent within the neck without significant stenosis (50% or greater). Eccentric hypodensity within the mid left common carotid artery has an appearance most suggestive of soft plaque. Eccentric thrombus cannot be definitively excluded. Vertebral arteries: The vertebral artery is slightly dominant. The vertebral arteries are patent within the neck. Soft plaque at the vertebral artery origins bilaterally with mild ostial stenosis on the left Skeleton: No acute bony abnormality or aggressive osseous lesion. Other neck: No neck mass or cervical lymphadenopathy. Chronic fracture deformity of the T1 spinous process Upper chest: No consolidation within the imaged lung apices. Review of the MIP images confirms the above findings CTA HEAD FINDINGS Anterior circulation: The intracranial internal carotid arteries are patent without significant stenosis. The M1  middle cerebral arteries are patent without significant stenosis. No M2 proximal branch occlusion or high-grade proximal stenosis is identified. The anterior cerebral arteries are patent without significant proximal stenosis. No intracranial aneurysm is identified. Posterior circulation: There is occlusive thrombus within the distal V4 right vertebral artery. Occlusive thrombus is also suspected within the distal V4 left vertebral artery. Occlusive thrombus extends into the proximal and mid basilar artery. There is reconstitution of flow within the distal basilar artery. Intermittent and irregular opacification of the right PICA is seen and this vessel likely a contains multifocal thrombus. Flow is seen within the proximal left PICA. The AICA vessels are not well delineated. Flow is seen within the superior cerebellar arteries bilaterally. The posterior cerebral arteries are patent proximally without significant stenosis. However, emboli are suspected within the distal posterior cerebral arteries bilaterally given the pattern of acute infarcts demonstrated on brain MRI performed earlier the same day. Posterior communicating arteries are not definitively identified. Venous sinuses: Within limitations of contrast timing, no convincing thrombus. Anatomic variants: As described. Review of the MIP images confirms the above findings These results were called by telephone at the time of interpretation on 06/02/2019 at 7:11 pm to provider Doralee Albino , who verbally acknowledged these results. IMPRESSION: CTA neck: 1.  Eccentric endoluminal thrombus within the aortic arch as described. 2. 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 3. The bilateral common and internal carotid arteries are patent within the neck without significant stenosis. Eccentric hypodensity within the mid left common carotid artery has an appearance most suggestive of soft plaque. Eccentric thrombus at this site cannot be  definitively excluded. 4. The vertebral arteries are patent within the neck bilaterally. Soft plaque at both vertebral artery origins with mild ostial stenosis on the left. CTA head: 1. Occlusive thrombus within the distal V4 right vertebral artery with occlusive thrombus extending into the proximal and mid basilar artery. Occlusive thrombus is also suspected within the very distal V4 left vertebral artery. There is reconstitution of flow within the distal basilar artery. 2. Only intermittent and irregular enhancement of the right PICA is seen and there is suspected multifocal thrombus within this vessel. 3. The bilateral AICAs are poorly delineated. 4. The posterior cerebral arteries are patent proximally without significant stenosis. However, emboli are suspected within the distal PCAs bilaterally given the pattern of acute infarcts demonstrated on same-day brain MRI. 5. No anterior circulation large vessel occlusion or proximal high-grade stenosis is identified. Electronically Signed   By: Jackey Loge DO   On: 06/02/2019 19:30   CT HEAD WO CONTRAST  Result Date: 06/04/2019 CLINICAL DATA:  Stroke follow-up EXAM: CT HEAD WITHOUT CONTRAST TECHNIQUE: Contiguous axial images were obtained from the base of the skull through the vertex without intravenous contrast. COMPARISON:  Brain MRI 06/02/2019 FINDINGS: Brain: Unchanged appearance of multifocal subacute infarcts scattered throughout the brain, involving both hemispheres, multiple vascular territories and the cerebellum. No hemorrhage or mass effect. Vascular: No hyperdense vessel or unexpected calcification. Skull: Normal. Negative for fracture or focal lesion. Sinuses/Orbits: No acute finding. Other: None. IMPRESSION: 1. Unchanged appearance of multifocal subacute infarcts scattered throughout the brain, involving both hemispheres and the cerebellum. 2. No hemorrhage or mass effect. Electronically Signed   By: Deatra Robinson M.D.   On: 06/04/2019 00:09   CT  ANGIO NECK W OR WO CONTRAST  Result Date: 06/02/2019 CLINICAL DATA:  Stroke suspected. EXAM: CT ANGIOGRAPHY HEAD AND NECK TECHNIQUE: Multidetector CT imaging of the head and neck was performed using the standard protocol during bolus administration of intravenous contrast. Multiplanar CT image reconstructions and MIPs were obtained to evaluate the vascular anatomy. Carotid stenosis measurements (when applicable) are obtained utilizing NASCET criteria, using the distal internal carotid diameter as the denominator. CONTRAST:  OMNIPAQUE IOHEXOL 350 MG/ML SOLN COMPARISON:  Brain MRI 06/02/2019. FINDINGS: CTA NECK FINDINGS Aortic arch: Standard aortic branching. There is an eccentric curvilinear filling defect within the lumen of aortic arch measuring 2.0 x 4.7 cm (series 7, image 344). Findings likely reflect eccentric thrombus. Additionally, there is occlusive thrombus within the proximal to mid innominate artery. There is reconstitution of flow at the level of the distal innominate artery. No significant proximal subclavian stenosis. Right carotid system: CCA and ICA patent within the neck without significant stenosis (50% or greater). Left carotid system: CCA and ICA patent within the neck without significant stenosis (50% or greater). Eccentric hypodensity within the mid left common carotid artery has an appearance most suggestive of soft plaque. Eccentric thrombus cannot be definitively excluded. Vertebral arteries: The vertebral artery is slightly dominant. The vertebral arteries are patent within the neck. Soft plaque at the vertebral artery origins bilaterally with mild ostial stenosis on the left Skeleton: No acute bony abnormality or aggressive osseous lesion. Other neck:  No neck mass or cervical lymphadenopathy. Chronic fracture deformity of the T1 spinous process Upper chest: No consolidation within the imaged lung apices. Review of the MIP images confirms the above findings CTA HEAD FINDINGS  Anterior circulation: The intracranial internal carotid arteries are patent without significant stenosis. The M1 middle cerebral arteries are patent without significant stenosis. No M2 proximal branch occlusion or high-grade proximal stenosis is identified. The anterior cerebral arteries are patent without significant proximal stenosis. No intracranial aneurysm is identified. Posterior circulation: There is occlusive thrombus within the distal V4 right vertebral artery. Occlusive thrombus is also suspected within the distal V4 left vertebral artery. Occlusive thrombus extends into the proximal and mid basilar artery. There is reconstitution of flow within the distal basilar artery. Intermittent and irregular opacification of the right PICA is seen and this vessel likely a contains multifocal thrombus. Flow is seen within the proximal left PICA. The AICA vessels are not well delineated. Flow is seen within the superior cerebellar arteries bilaterally. The posterior cerebral arteries are patent proximally without significant stenosis. However, emboli are suspected within the distal posterior cerebral arteries bilaterally given the pattern of acute infarcts demonstrated on brain MRI performed earlier the same day. Posterior communicating arteries are not definitively identified. Venous sinuses: Within limitations of contrast timing, no convincing thrombus. Anatomic variants: As described. Review of the MIP images confirms the above findings These results were called by telephone at the time of interpretation on 06/02/2019 at 7:11 pm to provider Doralee Albino , who verbally acknowledged these results. IMPRESSION: CTA neck: 1. Eccentric endoluminal thrombus within the aortic arch as described. 2. 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 3. The bilateral common and internal carotid arteries are patent within the neck without significant stenosis. Eccentric hypodensity within the mid left common  carotid artery has an appearance most suggestive of soft plaque. Eccentric thrombus at this site cannot be definitively excluded. 4. The vertebral arteries are patent within the neck bilaterally. Soft plaque at both vertebral artery origins with mild ostial stenosis on the left. CTA head: 1. Occlusive thrombus within the distal V4 right vertebral artery with occlusive thrombus extending into the proximal and mid basilar artery. Occlusive thrombus is also suspected within the very distal V4 left vertebral artery. There is reconstitution of flow within the distal basilar artery. 2. Only intermittent and irregular enhancement of the right PICA is seen and there is suspected multifocal thrombus within this vessel. 3. The bilateral AICAs are poorly delineated. 4. The posterior cerebral arteries are patent proximally without significant stenosis. However, emboli are suspected within the distal PCAs bilaterally given the pattern of acute infarcts demonstrated on same-day brain MRI. 5. No anterior circulation large vessel occlusion or proximal high-grade stenosis is identified. Electronically Signed   By: Jackey Loge DO   On: 06/02/2019 19:30   MR BRAIN WO CONTRAST  Result Date: 06/04/2019 CLINICAL DATA:  Stroke follow-up EXAM: MRI HEAD WITHOUT CONTRAST TECHNIQUE: Multiplanar, multiecho pulse sequences of the brain and surrounding structures were obtained without intravenous contrast. COMPARISON:  Head CT 06/03/2019 Brain MRI 06/02/2019 FINDINGS: Brain: Numerous right cerebellar embolic infarcts are unchanged. Infarcts of the bilateral pons have slightly increased in size. Multifocal acute ischemia within both occipital lobes, the left thalamus, the left caudate nucleus and the right frontal white matter are unchanged. Multifocal white matter hyperintensity, most commonly due to chronic ischemic microangiopathy. Normal volume of CSF spaces. No chronic microhemorrhage. Normal midline structures. Vascular: Normal flow  voids. Skull and upper  cervical spine: Normal marrow signal. Sinuses/Orbits: Negative. Other: None. IMPRESSION: 1. Slight increase in size of bilateral acute infarcts of the pons. Unchanged infarcts of the left thalamus, left caudate nucleus, right cerebellum and right frontal white matter. 2. No hemorrhage or mass effect. 3. Findings of chronic ischemic microangiopathy. Electronically Signed   By: Deatra Robinson M.D.   On: 06/04/2019 00:55   MR BRAIN WO CONTRAST  Result Date: 06/02/2019 CLINICAL DATA:  Speech difficulty. EXAM: MRI HEAD WITHOUT CONTRAST TECHNIQUE: Multiplanar, multiecho pulse sequences of the brain and surrounding structures were obtained without intravenous contrast. COMPARISON:  Noncontrast head CT 06/02/2019 FINDINGS: Brain: Please note the patient was unable to tolerate the full examination. As a result, postcontrast imaging could not be obtained. The acquired sequences are intermittently motion degraded. There are multiple small scattered foci of cortical and subcortical restricted diffusion within the bilateral cerebral hemispheres consistent with acute infarcts. These are most notably within the bilateral parietooccipital lobes, but also affect the bilateral frontal lobes. Additional small acute infarct within the left corona radiata. Multiple acute lacunar infarcts within the left thalamus. Multiple acute infarcts within the pons. Multiple acute infarcts within the right cerebellum. Corresponding T2/FLAIR hyperintensity at these sites. No significant mass effect. Chronic infarct within the right frontal lobe motor strip and high right parietal lobe. Background mild scattered T2/FLAIR hyperintensity within the cerebral white matter is nonspecific, but consistent with chronic small vessel ischemic disease. Mild generalized parenchymal atrophy. There is no evidence of intracranial mass. No midline shift or extra-axial fluid collection. No chronic intracranial blood products. Vascular: No  definite loss of expected proximal arterial flow voids. Skull and upper cervical spine: No focal marrow lesion. Sinuses/Orbits: Visualized orbits demonstrate no acute abnormality. Mild ethmoid and right maxillary sinus mucosal thickening. No significant mastoid effusion. IMPRESSION: The patient was unable to tolerate the full examination. As a result, postcontrast imaging could not be obtained. Numerous small supratentorial and infratentorial acute infarcts involving the cerebral hemispheres, left thalamus, pons and right cerebellar hemisphere. Given involvement of multiple vascular territories, findings are suggestive of an embolic process. Chronic right frontoparietal cortical infarcts. Background mild generalized parenchymal atrophy and chronic small vessel ischemic disease. Electronically Signed   By: Jackey Loge DO   On: 06/02/2019 15:24   MR CERVICAL SPINE WO CONTRAST  Result Date: 06/02/2019 CLINICAL DATA:  Spinal stenosis EXAM: MRI CERVICAL SPINE WITHOUT CONTRAST TECHNIQUE: Multiplanar, multisequence MR imaging of the cervical spine was performed. No intravenous contrast was administered. COMPARISON:  None. FINDINGS: Significant motion artifact is present Alignment: Trace degenerative listhesis. Vertebrae: Vertebral body heights appear preserved apart from mild degenerative endplate irregularity. No definite marrow edema. Cord: No definite abnormal signal. Posterior Fossa, vertebral arteries, paraspinal tissues: Small right cerebellar infarct. Refer to MRI brain earlier same day Disc levels: C2-C3: Small central disc protrusion indents the ventral thecal sac. Facet hypertrophy no significant canal or foraminal stenosis C3-C4: Facet hypertrophy. No significant canal or foraminal stenosis. C4-C5: Probable left paracentral disc osteophyte complex. No canal stenosis. Probable left foraminal stenosis. C5-C6: Disc bulge with endplate osteophytic ridging eccentric to the left. Probable mild canal stenosis.  Probable bilateral foraminal stenosis. C6-C7: Disc bulge with endplate osteophytic ridging eccentric to the left. No significant canal or foraminal stenosis. C7-T1:  No significant canal or foraminal stenosis. IMPRESSION: Motion degraded study. There is no high-grade canal stenosis. Probable foraminal stenosis, which is difficult to evaluate in the setting of artifact. Electronically Signed   By: Guadlupe Spanish M.D.   On:  06/02/2019 18:15   DG Chest Port 1 View  Result Date: 06/02/2019 CLINICAL DATA:  Recent stroke-like symptoms EXAM: PORTABLE CHEST 1 VIEW COMPARISON:  04/07/2016 FINDINGS: Cardiac shadow is mildly enlarged but accentuated by the portable technique. Gastric catheter is noted within the stomach but could be advanced several cm deeper to allow for the proximal side port to lie within the gastric lumen. Lungs are well aerated bilaterally. Mild bibasilar atelectasis is seen. No acute bony abnormality is noted. IMPRESSION: Gastric catheter as described. Mild bibasilar atelectasis. Electronically Signed   By: Alcide Clever M.D.   On: 06/02/2019 21:34   DG Swallowing Func-Speech Pathology  Result Date: 06/04/2019 Objective Swallowing Evaluation: Type of Study: MBS-Modified Barium Swallow Study  Patient Details Name: Adrian [name] MRN: [id number] Date of Birth: 07-23-1965 Today's Date: 06/04/2019 Time: SLP Start Time (ACUTE ONLY): 1010 -SLP Stop Time (ACUTE ONLY): 1040 SLP Time Calculation (min) (ACUTE ONLY): 30 min Past Medical History: Past Medical History: Diagnosis Date . Tobacco abuse  Past Surgical History: No past surgical history on file. HPI: This is a 54 year old gentleman with sudden onset of dizziness followed by dysarthria and right-sided weakness.  MRI confirms both supratentorial and infratentorial bilateral scattered infarcts.  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 MRI brain-numerous small  supratentorial and infratentorial acute infarcts involving the cerebral hemispheres, left thalamus, pons and right cerebellar hemisphere.  No data recorded Assessment / Plan / Recommendation CHL IP CLINICAL IMPRESSIONS 06/04/2019 Clinical Impression Pt presents with a primary moderate to severe oral dysphagia due to poor lingual fine motor coordination. Primary problem with the ability to both acheive posterior oral seal to prevent spillage to pharynx while also propelling bolus with appropriate propulsion as well as the inability to use to the tongue to manipulate a solid bolus for mastication. USe of nectar thick liquids improves time and cohesion of the bolus to reduce premature spillage and avoid delayed swallow intiation. With thins however there is posterior spillage of the bolus during propulsion efforts with sensed aspiration as laryngeal elevation initiates. Pt showed promise with a chin tuck maneuver (head turns ineffective), but due to intermittent crying behavior (PBA) pt could not sustain attention for thorough trials. Will attempt a chin tuck with thin at the bedside. For now, pt to initaite nectar thick liquids and pureed solids, preferably with consistent use of a straw. SLP Visit Diagnosis Dysphagia, oral phase (R13.11);Dysphagia, oropharyngeal phase (R13.12) Attention and concentration deficit following -- Frontal lobe and executive function deficit following -- Impact on safety and function Moderate aspiration risk   CHL IP TREATMENT RECOMMENDATION 06/04/2019 Treatment Recommendations Therapy as outlined in treatment plan below   No flowsheet data found. CHL IP DIET RECOMMENDATION 06/04/2019 SLP Diet Recommendations Dysphagia 1 (Puree) solids;Nectar thick liquid Liquid Administration via Straw Medication Administration Crushed with puree Compensations -- Postural Changes Remain semi-upright after after feeds/meals (Comment);Seated upright at 90 degrees   CHL IP OTHER RECOMMENDATIONS 06/04/2019  Recommended Consults -- Oral Care Recommendations Oral care BID Other Recommendations --   CHL IP FOLLOW UP RECOMMENDATIONS 06/04/2019 Follow up Recommendations Inpatient Rehab   CHL IP FREQUENCY AND DURATION 06/04/2019 Speech Therapy Frequency (ACUTE ONLY) min 2x/week Treatment Duration 2 weeks      CHL IP ORAL PHASE 06/04/2019 Oral Phase Impaired Oral - Pudding Teaspoon -- Oral - Pudding Cup -- Oral - Honey Teaspoon -- Oral - Honey Cup -- Oral -  Nectar Teaspoon Decreased bolus cohesion Oral - Nectar Cup -- Oral - Nectar Straw WFL Oral - Thin Teaspoon -- Oral - Thin Cup -- Oral - Thin Straw Decreased bolus cohesion;Premature spillage;Delayed oral transit Oral - Puree Delayed oral transit;WFL Oral - Mech Soft -- Oral - Regular Weak lingual manipulation;Impaired mastication Oral - Multi-Consistency -- Oral - Pill -- Oral Phase - Comment --  CHL IP PHARYNGEAL PHASE 06/04/2019 Pharyngeal Phase Impaired Pharyngeal- Pudding Teaspoon -- Pharyngeal -- Pharyngeal- Pudding Cup -- Pharyngeal -- Pharyngeal- Honey Teaspoon -- Pharyngeal -- Pharyngeal- Honey Cup -- Pharyngeal -- Pharyngeal- Nectar Teaspoon Lateral channel residue Pharyngeal -- Pharyngeal- Nectar Cup -- Pharyngeal -- Pharyngeal- Nectar Straw Lateral channel residue Pharyngeal -- Pharyngeal- Thin Teaspoon -- Pharyngeal -- Pharyngeal- Thin Cup Lateral channel residue;Penetration/Aspiration during swallow;Delayed swallow initiation-pyriform sinuses Pharyngeal Material enters airway, passes BELOW cords and not ejected out despite cough attempt by patient Pharyngeal- Thin Straw Lateral channel residue;Penetration/Aspiration before swallow;Delayed swallow initiation-pyriform sinuses Pharyngeal Material enters airway, passes BELOW cords and not ejected out despite cough attempt by patient Pharyngeal- Puree Lateral channel residue Pharyngeal -- Pharyngeal- Mechanical Soft -- Pharyngeal -- Pharyngeal- Regular Lateral channel residue Pharyngeal -- Pharyngeal- Multi-consistency  -- Pharyngeal -- Pharyngeal- Pill -- Pharyngeal -- Pharyngeal Comment --  No flowsheet data found. Herbie [place], MA CCC-SLP Acute Rehabilitation Services Pager [phone] Office [phone] Lynann Beaver 06/04/2019, 11:28 AM              EEG adult  Result Date: 06/03/2019 Lora Havens, MD     06/03/2019 11:56 AM Patient Name: Adrian [name] MRN: [id number] Epilepsy Attending: Lora Havens Referring Physician/Provider: Dr. Mina Marble Date: 06/02/2019 Duration: 30.13 minutes Patient history: 54 year old male present with sudden onset of dizziness and dysarthria as well as right-sided weakness.  EEG evaluate for seizures. Level of alertness: Awake AEDs during EEG study: None Technical aspects: This EEG study was done with scalp electrodes positioned according to the 10-20 International system of electrode placement. Electrical activity was acquired at a sampling rate of 500Hz  and reviewed with a high frequency filter of 70Hz  and a low frequency filter of 1Hz . EEG data were recorded continuously and digitally stored. Description: The posterior dominant rhythm consists of 8 Hz activity of moderate voltage (25-35 uV) seen predominantly in posterior head regions, symmetric and reactive to eye opening and eye closing. Hyperventilation and photic stimulation were not performed. IMPRESSION: This study is within normal limits. No seizures or epileptiform discharges were seen throughout the recording. Lora Havens   ECHOCARDIOGRAM COMPLETE  Result Date: 06/03/2019    ECHOCARDIOGRAM REPORT   Patient Name:   Adrian [name] Date of Exam: 06/03/2019 Medical Rec #:  [id number]     Height:       67.0 in Accession #:    [id number]    Weight:       156.7 lb Date of Birth:  [birth year]      BSA:          1.823 m Patient Age:    51 years      BP:           151/97 mmHg Patient Gender: M             HR:           89 bpm. Exam Location:  Inpatient Procedure: 2D Echo Indications:    Stroke 434.91/ I163.9   History:        Patient has no prior history of Echocardiogram examinations.  No                 prior cardiac.  Sonographer:    Leeroy Bock Turrentine Referring Phys: Cecilio Asper A HENSEL IMPRESSIONS  1. Left ventricular ejection fraction, by estimation, is 65 to 70%. The left ventricle has normal function. The left ventricle has no regional wall motion abnormalities. There is mild concentric left ventricular hypertrophy. Left ventricular diastolic parameters are consistent with Grade I diastolic dysfunction (impaired relaxation).  2. Right ventricular systolic function is normal. The right ventricular size is normal.  3. The mitral valve is normal in structure. No evidence of mitral valve regurgitation. No evidence of mitral stenosis.  4. The aortic valve is normal in structure. Aortic valve regurgitation is not visualized. No aortic stenosis is present.  5. The inferior vena cava is normal in size with greater than 50% respiratory variability, suggesting right atrial pressure of 3 mmHg. Conclusion(s)/Recommendation(s): No intracardiac source of embolism detected on this transthoracic study. A transesophageal echocardiogram is recommended to exclude cardiac source of embolism if clinically indicated. FINDINGS  Left Ventricle: Left ventricular ejection fraction, by estimation, is 65 to 70%. The left ventricle has normal function. The left ventricle has no regional wall motion abnormalities. The left ventricular internal cavity size was normal in size. There is  mild concentric left ventricular hypertrophy. Left ventricular diastolic parameters are consistent with Grade I diastolic dysfunction (impaired relaxation). Normal left ventricular filling pressure. Right Ventricle: The right ventricular size is normal. No increase in right ventricular wall thickness. Right ventricular systolic function is normal. Left Atrium: Left atrial size was normal in size. Right Atrium: Right atrial size was normal in size. Pericardium:  Trivial pericardial effusion is present. The pericardial effusion is posterior to the left ventricle. There is no evidence of cardiac tamponade. Mitral Valve: The mitral valve is normal in structure. Normal mobility of the mitral valve leaflets. No evidence of mitral valve regurgitation. No evidence of mitral valve stenosis. Tricuspid Valve: The tricuspid valve is normal in structure. Tricuspid valve regurgitation is trivial. No evidence of tricuspid stenosis. Aortic Valve: The aortic valve is normal in structure. Aortic valve regurgitation is not visualized. No aortic stenosis is present. Pulmonic Valve: The pulmonic valve was normal in structure. Pulmonic valve regurgitation is not visualized. No evidence of pulmonic stenosis. Aorta: The aortic root is normal in size and structure. Venous: The inferior vena cava is normal in size with greater than 50% respiratory variability, suggesting right atrial pressure of 3 mmHg. IAS/Shunts: No atrial level shunt detected by color flow Doppler.  LEFT VENTRICLE PLAX 2D LVIDd:         4.20 cm  Diastology LVIDs:         2.90 cm  LV e' lateral:   6.74 cm/s LV PW:         1.10 cm  LV E/e' lateral: 10.0 LV IVS:        1.10 cm  LV e' medial:    6.42 cm/s LVOT diam:     2.00 cm  LV E/e' medial:  10.5 LV SV:         65 LV SV Index:   36 LVOT Area:     3.14 cm  RIGHT VENTRICLE RV S prime:     16.20 cm/s TAPSE (M-mode): 3.0 cm LEFT ATRIUM             Index       RIGHT ATRIUM          Index  LA diam:        2.55 cm 1.40 cm/m  RA Area:     9.92 cm LA Vol (A2C):   15.8 ml 8.67 ml/m  RA Volume:   18.30 ml 10.04 ml/m LA Vol (A4C):   19.0 ml 10.42 ml/m LA Biplane Vol: 18.4 ml 10.09 ml/m  AORTIC VALVE LVOT Vmax:   129.00 cm/s LVOT Vmean:  84.100 cm/s LVOT VTI:    0.207 m  AORTA Ao Root diam: 2.90 cm Ao Asc diam:  3.00 cm MITRAL VALVE MV Area (PHT): 3.50 cm     SHUNTS MV Decel Time: 217 msec     Systemic VTI:  0.21 m MV E velocity: 67.30 cm/s   Systemic Diam: 2.00 cm MV A velocity:  101.00 cm/s MV E/A ratio:  0.67 Tobias Alexander MD Electronically signed by Tobias Alexander MD Signature Date/Time: 06/03/2019/8:22:00 PM    Final     Assessment/Plan: Diagnosis: Bilateral infarcts Stroke: Continue secondary stroke prophylaxis and Risk Factor Modification listed below:   Antiplatelet therapy:   Blood Pressure Management:  Continue current medication with prn's with permisive HTN per primary team Statin Agent:   Prediabetes management:   Tobacco abuse:   ?  Right >left hemiparesis: fit for orthosis to prevent contractures (resting hand splint for day, wrist cock up splint at night, PRAFO, etc) PT/OT for mobility, ADL training  Motor recovery: Fluoxetine (not on Plavix at present) Labs and images (see above) independently reviewed.  Records reviewed and summated above.  1. Does the need for close, 24 hr/day medical supervision in concert with the patient's rehab needs make it unreasonable for this patient to be served in a less intensive setting? Yes 2. Co-Morbidities requiring supervision/potential complications: Chronic low back pain (Biofeedback training with therapies to help reduce reliance on opiate pain medications, monitor pain control during therapies, and sedation at rest and titrate to maximum efficacy to ensure participation and gains in therapies), tobacco abuse (counsel and appropriate), leukocytosis (repeat labs, cont to monitor for signs and symptoms of infection, further workup if indicated), prediabetes (Monitor in accordance with exercise and adjust meds as necessary) 3. Due to bladder management, bowel management, safety, disease management, medication administration, pain management and patient education, does the patient require 24 hr/day rehab nursing? Yes 4. Does the patient require coordinated care of a physician, rehab nurse, therapy disciplines of PT/OT/SLP to address physical and functional deficits in the context of the above medical diagnosis(es)?  Yes Addressing deficits in the following areas: balance, endurance, locomotion, strength, transferring, bathing, dressing, toileting, cognition, speech, language, swallowing and psychosocial support 5. Can the patient actively participate in an intensive therapy program of at least 3 hrs of therapy per day at least 5 days per week? Potentially 6. The potential for patient to make measurable gains while on inpatient rehab is excellent 7. Anticipated functional outcomes upon discharge from inpatient rehab are ?min assist  with PT, ?min assist with OT, ?supervision with SLP. 8. Estimated rehab length of stay to reach the above functional goals is: 18-22 days, potentially sooner based on family reports of engagement and movement. 9. Anticipated discharge destination: Home 10. Overall Rehab/Functional Prognosis: good  RECOMMENDATIONS: This patient's condition is appropriate for continued rehabilitative care in the following setting: CIR once medically stable and work-up complete. Patient has agreed to participate in recommended program. Potentially Note that insurance prior authorization may be required for reimbursement for recommended care.  Comment: Rehab Admissions Coordinator to follow up.  I have personally performed  a face to face diagnostic evaluation, including, but not limited to relevant history and physical exam findings, of this patient and developed relevant assessment and plan.  Additionally, I have reviewed and concur with the physician assistant's documentation above.   Maryla MorrowAnkit Diella Gillingham, MD, ABPMR Mcarthur Rossettianiel J Angiulli, PA-C 06/04/2019

## 2019-06-04 NOTE — Progress Notes (Signed)
Allow for permissive hypertension per MD. Sys <220. Kaladin Noseworthy, [place] Scrape, RN

## 2019-06-04 NOTE — Progress Notes (Signed)
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 Codeine Other (See Comments)    unknown  . Motrin [Ibuprofen] Other (See Comments)    unknown  . Penicillins Other (See Comments)    unknown    Patient Measurements: Height: 5\' 7"  (170.2 cm) Weight: 71.1 kg (156 lb 12 oz) IBW/kg (Calculated) : 66.1 Heparin Dosing Weight: 71.1 kg   Vital Signs: Temp: 97.4 F (36.3 C) (04/16 2000) Temp Source: Oral (04/16 2000) BP: 174/76 (04/16 2100) Pulse Rate: 62 (04/16 2100)  Labs: Recent Labs    06/02/19 1058 06/02/19 1058 06/02/19 1830 06/03/19 0657 06/04/19 0541 06/04/19 2058  HGB 17.0   < >  --  15.5 16.7  --   HCT 50.6  --   --  44.6 48.0  --   PLT 293  --   --  254 210  --   LABPROT 12.1  --   --   --   --   --   INR 0.9  --   --   --   --   --   HEPARINUNFRC  --   --   --   --   --  0.26*  CREATININE 1.03  --   --  0.86 0.87  --   CKTOTAL  --   --  118  --   --   --    < > = values in this interval not displayed.    Estimated Creatinine Clearance: 90.8 mL/min (by C-G formula based on SCr of 0.87 mg/dL).   Medical History: Past Medical History:  Diagnosis Date  . Tobacco abuse     Assessment: 54 yo male presented on 06/02/2019 with weakness and slurred speech concerning for stroke. 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 Today, patient was found to be hypotensive (patient intermittently requiring norepinephrine pressor support) and difficult to arouse. Patient taken to STAT head CT and found to be negative for hemorrhage. Pharmacy was consulted to dose heparin per stroke protocol for basilar and vertebral occlusion. No reported bleeding. CBC stable.   Heparin level ~5.5 hrs after starting heparin infusion  at 900 units/hr was 0.26 units/ml, which is just below the goal range for this pt. H/H, platelets WNL. Per RN, no issues with IV or bleeding observed.  Goal of Therapy:  Heparin level 0.3 - 0.5 units/mL Monitor platelets by anticoagulation protocol: Yes   Plan:  Increase heparin infusion to 950 units/hr Check 6-hr heparin level Monitor daily heparin level, CBC Monitor for signs/symptoms of bleeding  06/04/2019, PharmD, BCPS, [facility] Clinical Pharmacist 06/04/2019,10:14 PM

## 2019-06-04 NOTE — Progress Notes (Signed)
Modified Barium Swallow Progress Note  Patient Details  Name: Adrian [name] MRN: [id number] Date of Birth: 1965-05-25  Today's Date: 06/04/2019  Modified Barium Swallow completed.  Full report located under Chart Review in the Imaging Section.  Brief recommendations include the following:  Clinical Impression  Pt presents with a primary moderate to severe oral dysphagia due to poor lingual fine motor coordination. Primary problem with the ability to both acheive posterior oral seal to prevent spillage to pharynx while also propelling bolus with appropriate propulsion as well as the inability to use to the tongue to manipulate a solid bolus for mastication. USe of nectar thick liquids improves time and cohesion of the bolus to reduce premature spillage and avoid delayed swallow intiation. With thins however there is posterior spillage of the bolus during propulsion efforts with sensed aspiration as laryngeal elevation initiates. Pt showed promise with a chin tuck maneuver (head turns ineffective), but due to intermittent crying behavior (PBA) pt could not sustain attention for thorough trials. Will attempt a chin tuck with thin at the bedside. For now, pt to initaite nectar thick liquids and pureed solids, preferably with consistent use of a straw.   Swallow Evaluation Recommendations       SLP Diet Recommendations: Dysphagia 1 (Puree) solids;Nectar thick liquid   Liquid Administration via: Straw   Medication Administration: Crushed with puree   Supervision: Staff to assist with self feeding       Postural Changes: Remain semi-upright after after feeds/meals (Comment);Seated upright at 90 degrees   Oral Care Recommendations: Oral care BID        Thurza Kwiecinski, Riley Nearing 06/04/2019,11:28 AM

## 2019-06-04 NOTE — Progress Notes (Signed)
Pt up to [facility] loss of muscle tonicity, ability to communicate, R gaze preference, no response to pain. MD notified, CT/ MRI ordered.

## 2019-06-04 NOTE — Progress Notes (Signed)
ANTICOAGULATION CONSULT NOTE - Initial Consult  Pharmacy Consult for Heparin Indication: stroke - basilar and vertebral artery occlusion  Allergies  Allergen Reactions  . Codeine Other (See Comments)    unknown  . Motrin [Ibuprofen] Other (See Comments)    unknown  . Penicillins Other (See Comments)    unknown    Patient Measurements: Height: 5\' 7"  (170.2 cm) Weight: 71.1 kg (156 lb 12 oz) IBW/kg (Calculated) : 66.1 Heparin Dosing Weight: 71.1 kg   Vital Signs: Temp: 98.7 F (37.1 C) (04/16 1200) Temp Source: Oral (04/16 1200) BP: 178/74 (04/16 1500) Pulse Rate: 63 (04/16 1500)  Labs: Recent Labs    06/02/19 1058 06/02/19 1058 06/02/19 1830 06/03/19 0657 06/04/19 0541  HGB 17.0   < >  --  15.5 16.7  HCT 50.6  --   --  44.6 48.0  PLT 293  --   --  254 210  LABPROT 12.1  --   --   --   --   INR 0.9  --   --   --   --   CREATININE 1.03  --   --  0.86 0.87  CKTOTAL  --   --  118  --   --    < > = values in this interval not displayed.    Estimated Creatinine Clearance: 90.8 mL/min (by C-G formula based on SCr of 0.87 mg/dL).   Medical History: Past Medical History:  Diagnosis Date  . Tobacco abuse     Assessment: 54 yo male presented on 06/02/2019 with weakness and slurred speech concerning for stroke. 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 Today, patient found to be hypotensive (patient intermittently requiring norepinephrine pressor support) and difficult to arouse. Patient taken to STAT head CT and found to be negative for hemorrhage. Pharmacy consulted to dose heparin for stroke protocol for basilar and vertebral occlusion. No reported bleeding. CBC stable.   Goal of Therapy:  Heparin level 0.3 - 0.5 units/mL Monitor platelets by anticoagulation protocol: Yes   Plan:  Start heparin 900 units/hr (no bolus)  Check  heparin level at 2130 Monitor heparin level, CBC, and S/S of bleeding daily   2131, PharmD PGY1 Pharmacy Resident [employer]: [phone]  06/04/2019,3:03 PM

## 2019-06-04 NOTE — Progress Notes (Signed)
Inpatient Rehab Admissions:  Inpatient Rehab Consult received.  Pt down for repeat head CT 2/2 events last night.  Will f/u later today or Monday as schedule allows.   Signed: Estill Dooms, PT, DPT Admissions Coordinator [phone] 06/04/19  2:41 PM

## 2019-06-04 NOTE — Social Work (Signed)
CSW was unable to complete sbirt due to the pt not being oriented. CSW may re attempt at more more appropriate time.   Jimmy Picket, Theresia Majors, [place] Clinical Social Worker [phone]

## 2019-06-04 NOTE — Progress Notes (Signed)
RN went into the room at 1400 for a reassessment and medication administration and found that patient was hard to arouse, nonverbal, and generally weak. His BP was 100/64 (77). Pt was laid flat, Levophed gtt restarted and STAT head CT ordered. 1L Bolus of NS ordered and given. MD aware and on his way. Family at the beside. Anyelo Mccue, [place] Scrape, RN

## 2019-06-04 NOTE — Progress Notes (Signed)
eLink Physician-Brief Progress Note Patient Name: Adrian [name] DOB: 06-04-1965 MRN: [id number]   Date of Service  06/04/2019  HPI/Events of Note  Neuro's BP goal is SBP > 120. Currently SBP 105.   eICU Interventions  Peripheral levophed drip ordered to maintain BP within goal.     Intervention Category Major Interventions: Hypotension - evaluation and management  Marveen Reeks Baer Hinton 06/04/2019, 4:19 AM

## 2019-06-05 ENCOUNTER — Inpatient Hospital Stay (HOSPITAL_COMMUNITY): Payer: Medicaid Other

## 2019-06-05 DIAGNOSIS — I951 Orthostatic hypotension: Secondary | ICD-10-CM

## 2019-06-05 LAB — CBC
HCT: 43.8 % (ref 39.0–52.0)
Hemoglobin: 14.5 g/dL (ref 13.0–17.0)
MCH: 30.1 pg (ref 26.0–34.0)
MCHC: 33.1 g/dL (ref 30.0–36.0)
MCV: 90.9 fL (ref 80.0–100.0)
Platelets: 223 10*3/uL (ref 150–400)
RBC: 4.82 MIL/uL (ref 4.22–5.81)
RDW: 14.3 % (ref 11.5–15.5)
WBC: 19.5 10*3/uL — ABNORMAL HIGH (ref 4.0–10.5)
nRBC: 0 % (ref 0.0–0.2)

## 2019-06-05 LAB — BASIC METABOLIC PANEL
Anion gap: 13 (ref 5–15)
BUN: 12 mg/dL (ref 6–20)
CO2: 18 mmol/L — ABNORMAL LOW (ref 22–32)
Calcium: 8.2 mg/dL — ABNORMAL LOW (ref 8.9–10.3)
Chloride: 108 mmol/L (ref 98–111)
Creatinine, Ser: 0.86 mg/dL (ref 0.61–1.24)
GFR calc Af Amer: >60 mL/min (ref >60)
GFR calc non Af Amer: >60 mL/min (ref >60)
Glucose, Bld: 89 mg/dL (ref 70–99)
Potassium: 3.3 mmol/L — ABNORMAL LOW (ref 3.5–5.1)
Sodium: 139 mmol/L (ref 135–145)

## 2019-06-05 LAB — HEPARIN LEVEL (UNFRACTIONATED)
Heparin Unfractionated: 0.51 IU/mL (ref 0.30–0.70)
Heparin Unfractionated: 0.43 IU/mL (ref 0.30–0.70)
Heparin Unfractionated: 0.37 IU/mL (ref 0.30–0.70)

## 2019-06-05 IMAGING — DX DG CHEST 1V PORT
1 series · 1 of 1 positions shown · non-contrast
Comparison: [DATE].

CLINICAL DATA: Leukocytosis.

EXAM:
PORTABLE CHEST 1 VIEW

[chest ap]
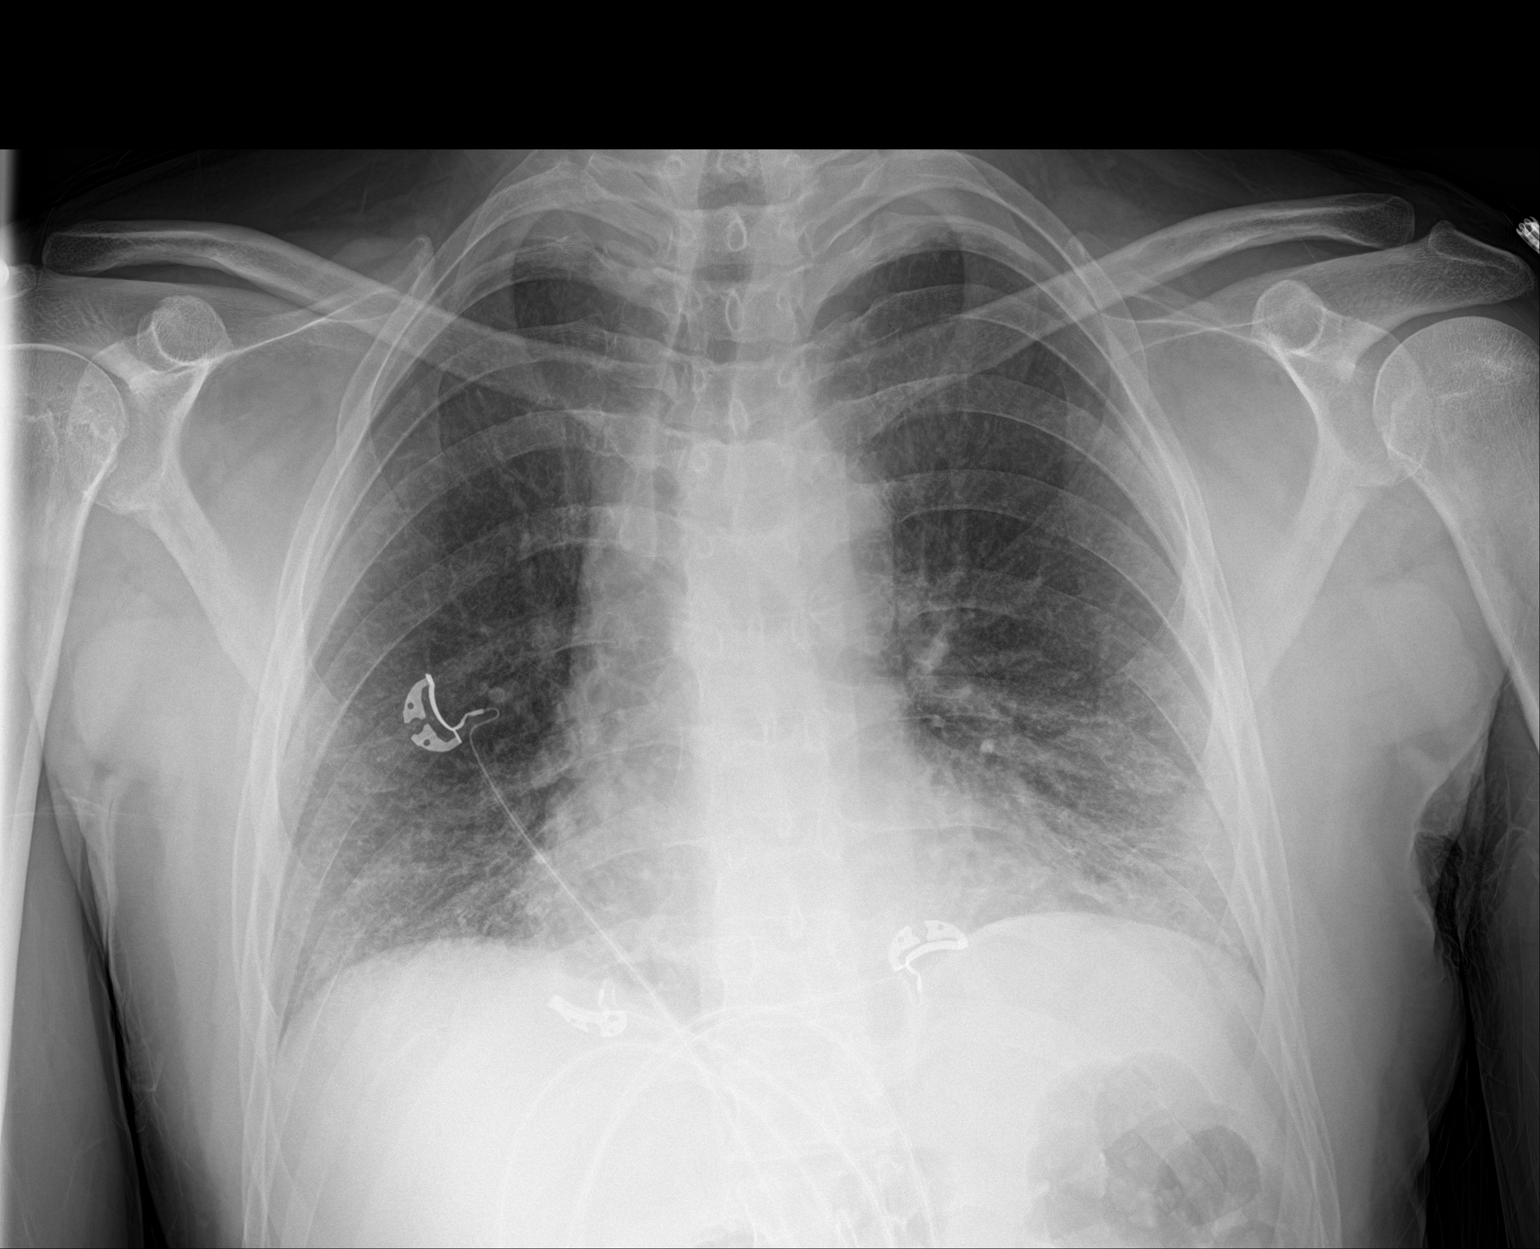

[1 of 1 positions shown; findings below may reference images not displayed]

FINDINGS: The heart size and mediastinal contours are within normal limits. No
pneumothorax or pleural effusion is noted. Mild bibasilar
subsegmental atelectasis is noted. The visualized skeletal
structures are unremarkable.
IMPRESSION: Mild bibasilar subsegmental atelectasis.

## 2019-06-05 MED ORDER — PANTOPRAZOLE SODIUM 40 MG PO TBEC: 40.0000 mg | DELAYED_RELEASE_TABLET | Freq: Every day | ORAL | Status: DC

## 2019-06-05 MED ORDER — PANTOPRAZOLE SODIUM 40 MG IV SOLR
40.0000 mg | INTRAVENOUS | Status: DC
  Administered 2019-06-05 – 2019-06-09 (×5): 40 mg via INTRAVENOUS
  Filled 2019-06-05 (×5): qty 40

## 2019-06-05 MED ORDER — POTASSIUM CHLORIDE 10 MEQ/100ML IV SOLN: 10.0000 meq | INTRAVENOUS | Status: DC

## 2019-06-05 MED ORDER — POTASSIUM CHLORIDE 20 MEQ PO PACK
20.0000 meq | PACK | Freq: Two times a day (BID) | ORAL | Status: AC
  Administered 2019-06-05 (×2): 20 meq via ORAL
  Filled 2019-06-05 (×2): qty 1

## 2019-06-05 NOTE — Progress Notes (Signed)
  Speech Language Pathology Treatment: Dysphagia  Patient Details Name: Adrian [name] MRN: [id number] DOB: June 15, 1965 Today's Date: 06/05/2019 Time: 8413-2440 SLP Time Calculation (min) (ACUTE ONLY): 17 min  Assessment / Plan / Recommendation Clinical Impression  Chart reviewed - note that pt had a change in neuro status on previous date, prompting a change in orders to NPO. Repeat imaging is stable and per RN, pt has returned to his prior functional status. SLP provided trials of purees and nectar thick liquids as were recommended on MBS 4/16. No overt s/s of aspiration were noted, and oral dysphagia seems stable. Nectar thick liquids were trialed via cup and straw given difficulties extracting liquid consistently through the straw. Recommend resuming Dys 1 diet and nectar thick liquids. SLP will also f/u for trials of thin liquids with a chin tuck and assessment of communication and cognition.   HPI HPI: This is a 54 year old gentleman with sudden onset of dizziness followed by dysarthria and right-sided weakness.  MRI confirms both supratentorial and infratentorial bilateral scattered infarcts.  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 MRI brain-numerous small supratentorial and infratentorial acute infarcts involving the cerebral hemispheres, left thalamus, pons and right cerebellar hemisphere.       SLP Plan  Continue with current plan of care       Recommendations  Diet recommendations: Dysphagia 1 (puree);Nectar-thick liquid Liquids provided via: Cup;Straw Medication Administration: Crushed with puree Supervision: Staff to assist with self feeding;Full supervision/cueing for compensatory strategies Compensations: Minimize environmental distractions;Slow rate;Small sips/bites Postural Changes and/or Swallow Maneuvers: Seated upright 90 degrees                Oral Care  Recommendations: Oral care BID Follow up Recommendations: Inpatient Rehab SLP Visit Diagnosis: Dysphagia, oral phase (R13.11);Dysphagia, oropharyngeal phase (R13.12) Plan: Continue with current plan of care       GO                 Mahala Menghini., M.A. CCC-SLP Acute Rehabilitation Services Pager [phone] Office [phone]  06/05/2019, 9:45 AM

## 2019-06-05 NOTE — Progress Notes (Addendum)
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 Codeine Other (See Comments)    unknown  . Motrin [Ibuprofen] Other (See Comments)    unknown  . Penicillins Other (See Comments)    unknown    Patient Measurements: Height: 5\' 7"  (170.2 cm) Weight: 71.1 kg (156 lb 12 oz) IBW/kg (Calculated) : 66.1 Heparin Dosing Weight: 71.1 kg   Vital Signs: Temp: 98.7 F (37.1 C) (04/17 0400) Temp Source: Oral (04/17 0400) BP: 191/96 (04/17 0700) Pulse Rate: 66 (04/17 0700)  Labs: Recent Labs    06/02/19 1058 06/02/19 1058 06/02/19 1830 06/03/19 0657 06/03/19 0657 06/04/19 0541 06/04/19 2058 06/05/19 0535  HGB 17.0   < >  --  15.5   < > 16.7  --  14.5  HCT 50.6   < >  --  44.6  --  48.0  --  43.8  PLT 293   < >  --  254  --  210  --  223  LABPROT 12.1  --   --   --   --   --   --   --   INR 0.9  --   --   --   --   --   --   --   HEPARINUNFRC  --   --   --   --   --   --  0.26* 0.51  CREATININE 1.03   < >  --  0.86  --  0.87  --  0.86  CKTOTAL  --   --  118  --   --   --   --   --    < > = values in this interval not displayed.    Estimated Creatinine Clearance: 91.8 mL/min (by C-G formula based on SCr of 0.86 mg/dL).   Medical History: Past Medical History:  Diagnosis Date  . Tobacco abuse     Assessment: 54 yo male presented on 06/02/2019 with weakness and slurred speech concerning for stroke. 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 Today, patient found to be hypotensive (patient intermittently requiring norepinephrine pressor support) and difficult to arouse. Patient taken to STAT head CT and found to be negative for hemorrhage. Pharmacy consulted to dose heparin for stroke protocol for basilar and vertebral  occlusion.  Heparin level this morning was 0.51 which is slightly supratherapeutic (goal 0.3-0.5 units/mL). Patient was ordered to be on heparin 950 units/hr but Rph found heparin was infusing at 960 units/hr and confirmed with RN that heparin was infusing at this rate. CBC stable. No reported bleeding.   Goal of Therapy:  Heparin level 0.3 - 0.5 units/mL Monitor platelets by anticoagulation protocol: Yes   Plan:  Decrease heparin to 950 units/hr (order will remain the same but confirmed with RN heparin infusion rate was reduced) Check heparin level at 1400 Monitor heparin level, CBC, and S/S of bleeding daily   06/04/2019, PharmD PGY1 Pharmacy Resident [employer]: [phone]  06/05/2019,7:23 AM   Addendum: Heparin level 0.43 on heparin 950 units/hr is therapeutic and within goal of 0.3 to 0.5 units/mL. Per RN no bleeding and no issues with IV infusion or access.   Plan: - Continue heparin at 950 units/hr  - Check heparin level at 2000

## 2019-06-05 NOTE — Progress Notes (Signed)
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 Codeine Other (See Comments)    unknown  . Motrin [Ibuprofen] Other (See Comments)    unknown  . Penicillins Other (See Comments)    unknown    Patient Measurements: Height: 5\' 7"  (170.2 cm) Weight: 71.1 kg (156 lb 12 oz) IBW/kg (Calculated) : 66.1 Heparin Dosing Weight: 71.1 kg   Vital Signs: Temp: 96.8 F (36 C) (04/17 2000) Temp Source: Axillary (04/17 2000) BP: 146/77 (04/17 2000) Pulse Rate: 69 (04/17 2000)  Labs: Recent Labs    06/03/19 0657 06/03/19 0657 06/04/19 0541 06/04/19 2058 06/05/19 0535 06/05/19 1331 06/05/19 2005  HGB 15.5   < > 16.7  --  14.5  --   --   HCT 44.6  --  48.0  --  43.8  --   --   PLT 254  --  210  --  223  --   --   HEPARINUNFRC  --   --   --    < > 0.51 0.43 0.37  CREATININE 0.86  --  0.87  --  0.86  --   --    < > = values in this interval not displayed.    Estimated Creatinine Clearance: 91.8 mL/min (by C-G formula based on SCr of 0.86 mg/dL).   Medical History: Past Medical History:  Diagnosis Date  . Tobacco abuse     Assessment: 54 yo male presented on 06/02/2019 with weakness and slurred speech concerning for stroke. 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 Today, patient found to be hypotensive (patient intermittently requiring norepinephrine pressor support) and difficult to arouse. Patient taken to STAT head CT and found to be negative for hemorrhage. Pharmacy consulted to dose heparin for stroke protocol for basilar and vertebral occlusion.  Heparin level this evening at goal 0.37 (goal 0.3-0.5 units/mL). CBC stable. No reported bleeding.   Goal of Therapy:  Heparin level 0.3 - 0.5 units/mL Monitor  platelets by anticoagulation protocol: Yes   Plan:  Continue heparin at 950 units/hr Monitor heparin level, CBC, and S/S of bleeding daily   06/04/2019 PharmD., BCPS Clinical Pharmacist 06/05/2019 9:01 PM

## 2019-06-05 NOTE — Progress Notes (Signed)
Family medicine is following pt socially while he is in the ICU.  Should the patient come out to the floor from ICU we will resume as primary team.  It appears he is in the process of being assessed for appropriateness for inpatient rehab.    Lenor Coffin, MD PGY2

## 2019-06-05 NOTE — Progress Notes (Signed)
STROKE TEAM PROGRESS NOTE   INTERVAL HISTORY Patient wife and son are at the bedside. Pt lying in bed, awake alert, severe dysarthria and intangible. B/l lower facial weakness more on the left, quadriparesis L>R. Yesterday pt has episode of orthostatic hypotension with neuro worsening, kept NPO and received IVF. With BP improving, he is now back to previous neuro status. He passed swallow today and now on diet. Continue ASA and plavix and also continue heparin IV for 2-3 days for stabilization.   Vitals:   06/05/19 0400 06/05/19 0500 06/05/19 0600 06/05/19 0700  BP: 130/67 (!) 156/73 (!) 156/73 (!) 191/96  Pulse: (!) 50 (!) 52 60 66  Resp: 15 15 15  (!) 21  Temp: 98.7 F (37.1 C)     TempSrc: Oral     SpO2: 95% 95% 93% 94%  Weight:      Height:        CBC:  Recent Labs  Lab 06/02/19 1058 06/03/19 0657 06/04/19 0541 06/05/19 0535  WBC 21.3*   < > 19.9* 19.5*  NEUTROABS 13.8*  --   --   --   HGB 17.0   < > 16.7 14.5  HCT 50.6   < > 48.0 43.8  MCV 90.0   < > 87.6 90.9  PLT 293   < > 210 223   < > = values in this interval not displayed.    Basic Metabolic Panel:  Recent Labs  Lab 06/04/19 0541 06/05/19 0535  NA 140 139  K 3.8 3.3*  CL 109 108  CO2 18* 18*  GLUCOSE 112* 89  BUN 15 12  CREATININE 0.87 0.86  CALCIUM 8.3* 8.2*  MG 2.1  --    Lipid Panel:     Component Value Date/Time   CHOL 221 (H) 06/02/2019 1830   TRIG 414 (H) 06/02/2019 1830   HDL 40 (L) 06/02/2019 1830   CHOLHDL 5.5 06/02/2019 1830   VLDL UNABLE TO CALCULATE IF TRIGLYCERIDE OVER 400 mg/dL 06/02/2019 1830   LDLCALC UNABLE TO CALCULATE IF TRIGLYCERIDE OVER 400 mg/dL 06/02/2019 1830   HgbA1c:  Lab Results  Component Value Date   HGBA1C 5.8 (H) 06/02/2019   Urine Drug Screen:     Component Value Date/Time   LABOPIA NONE DETECTED 06/02/2019 1400   COCAINSCRNUR NONE DETECTED 06/02/2019 1400   LABBENZ NONE DETECTED 06/02/2019 1400   AMPHETMU NONE DETECTED 06/02/2019 1400   THCU NONE  DETECTED 06/02/2019 1400   LABBARB NONE DETECTED 06/02/2019 1400    Alcohol Level     Component Value Date/Time   ETH <10 06/02/2019 1058    IMAGING past 24 hours  CT HEAD WO CONTRAST  Result Date: 06/04/2019 CLINICAL DATA:  Subacute neurological change. EXAM: CT HEAD WITHOUT CONTRAST TECHNIQUE: Contiguous axial images were obtained from the base of the skull through the vertex without intravenous contrast. COMPARISON:  06/04/2019 head CT and MRI head. FINDINGS: Brain: Similar appearance of multifocal bilateral cerebral and right cerebellar infarcts also involving the left basal ganglia, left thalamus and pons. No new hypodense foci. No intracranial hemorrhage. No mass lesion. No midline shift, ventriculomegaly or extra-axial fluid collection. Vascular: No hyperdense vessel or unexpected calcification. Skull: Negative for fracture or focal lesion. Sinuses/Orbits: Normal orbits. Clear paranasal sinuses. No mastoid effusion. Other: None. IMPRESSION: Multifocal subacute infarcts involving the bilateral cerebrum, right cerebellum, left basal ganglia/thalamus and pons are unchanged. No intracranial hemorrhage. Electronically Signed   By: Primitivo Gauze M.D.   On: 06/04/2019 15:03   DG  Swallowing Func-Speech Pathology  Result Date: 06/04/2019 Objective Swallowing Evaluation: Type of Study: MBS-Modified Barium Swallow Study  Patient Details Name: Adrian [name] MRN: [id number] Date of Birth: Nov 25, 2010 Today's Date: 06/04/2019 Time: SLP Start Time (ACUTE ONLY): 1010 -SLP Stop Time (ACUTE ONLY): 1040 SLP Time Calculation (min) (ACUTE ONLY): 30 min Past Medical History: Past Medical History: Diagnosis Date . Tobacco abuse  Past Surgical History: No past surgical history on file. HPI: This is a 54 year old gentleman with sudden onset of dizziness followed by dysarthria and right-sided weakness.  MRI confirms both supratentorial and infratentorial bilateral scattered infarcts.  Initially he was being admitted  for a suspected embolic work-up, however on a CT angiogram it was revealed that he had a basilar occlusion. MRI brain-numerous small supratentorial and infratentorial acute infarcts involving the cerebral hemispheres, left thalamus, pons and right cerebellar hemisphere.  No data recorded Assessment / Plan / Recommendation CHL IP CLINICAL IMPRESSIONS 06/04/2019 Clinical Impression Pt presents with a primary moderate to severe oral dysphagia due to poor lingual fine motor coordination. Primary problem with the ability to both acheive posterior oral seal to prevent spillage to pharynx while also propelling bolus with appropriate propulsion as well as the inability to use to the tongue to manipulate a solid bolus for mastication. USe of nectar thick liquids improves time and cohesion of the bolus to reduce premature spillage and avoid delayed swallow intiation. With thins however there is posterior spillage of the bolus during propulsion efforts with sensed aspiration as laryngeal elevation initiates. Pt showed promise with a chin tuck maneuver (head turns ineffective), but due to intermittent crying behavior (PBA) pt could not sustain attention for thorough trials. Will attempt a chin tuck with thin at the bedside. For now, pt to initaite nectar thick liquids and pureed solids, preferably with consistent use of a straw. SLP Visit Diagnosis Dysphagia, oral phase (R13.11);Dysphagia, oropharyngeal phase (R13.12) Attention and concentration deficit following -- Frontal lobe and executive function deficit following -- Impact on safety and function Moderate aspiration risk   CHL IP TREATMENT RECOMMENDATION 06/04/2019 Treatment Recommendations Therapy as outlined in treatment plan below   No flowsheet data found. CHL IP DIET RECOMMENDATION 06/04/2019 SLP Diet Recommendations Dysphagia 1 (Puree) solids;Nectar thick liquid Liquid Administration via Straw Medication Administration Crushed with puree Compensations -- Postural  Changes Remain semi-upright after after feeds/meals (Comment);Seated upright at 90 degrees   CHL IP OTHER RECOMMENDATIONS 06/04/2019 Recommended Consults -- Oral Care Recommendations Oral care BID Other Recommendations --   CHL IP FOLLOW UP RECOMMENDATIONS 06/04/2019 Follow up Recommendations Inpatient Rehab   CHL IP FREQUENCY AND DURATION 06/04/2019 Speech Therapy Frequency (ACUTE ONLY) min 2x/week Treatment Duration 2 weeks      CHL IP ORAL PHASE 06/04/2019 Oral Phase Impaired Oral - Pudding Teaspoon -- Oral - Pudding Cup -- Oral - Honey Teaspoon -- Oral - Honey Cup -- Oral - Nectar Teaspoon Decreased bolus cohesion Oral - Nectar Cup -- Oral - Nectar Straw WFL Oral - Thin Teaspoon -- Oral - Thin Cup -- Oral - Thin Straw Decreased bolus cohesion;Premature spillage;Delayed oral transit Oral - Puree Delayed oral transit;WFL Oral - Mech Soft -- Oral - Regular Weak lingual manipulation;Impaired mastication Oral - Multi-Consistency -- Oral - Pill -- Oral Phase - Comment --  CHL IP PHARYNGEAL PHASE 06/04/2019 Pharyngeal Phase Impaired Pharyngeal- Pudding Teaspoon -- Pharyngeal -- Pharyngeal- Pudding Cup -- Pharyngeal -- Pharyngeal- Honey Teaspoon -- Pharyngeal -- Pharyngeal- Honey Cup -- Pharyngeal -- Pharyngeal- Nectar Teaspoon Lateral channel residue Pharyngeal --  Pharyngeal- Nectar Cup -- Pharyngeal -- Pharyngeal- Nectar Straw Lateral channel residue Pharyngeal -- Pharyngeal- Thin Teaspoon -- Pharyngeal -- Pharyngeal- Thin Cup Lateral channel residue;Penetration/Aspiration during swallow;Delayed swallow initiation-pyriform sinuses Pharyngeal Material enters airway, passes BELOW cords and not ejected out despite cough attempt by patient Pharyngeal- Thin Straw Lateral channel residue;Penetration/Aspiration before swallow;Delayed swallow initiation-pyriform sinuses Pharyngeal Material enters airway, passes BELOW cords and not ejected out despite cough attempt by patient Pharyngeal- Puree Lateral channel residue Pharyngeal --  Pharyngeal- Mechanical Soft -- Pharyngeal -- Pharyngeal- Regular Lateral channel residue Pharyngeal -- Pharyngeal- Multi-consistency -- Pharyngeal -- Pharyngeal- Pill -- Pharyngeal -- Pharyngeal Comment --  No flowsheet data found. Harlon Ditty, MA CCC-SLP Acute Rehabilitation Services Pager [phone] Office [phone] Claudine Mouton 06/04/2019, 11:28 AM               PHYSICAL EXAM  Temp:  [97.4 F (36.3 C)-98.7 F (37.1 C)] 97.5 F (36.4 C) (04/17 0800) Pulse Rate:  [50-80] 63 (04/17 0800) Resp:  [10-22] 14 (04/17 0800) BP: (100-195)/(64-183) 123/72 (04/17 0800) SpO2:  [91 %-97 %] 95 % (04/17 0800)  General - Well nourished, well developed, in no apparent distress.  Ophthalmologic - fundi not visualized due to noncooperation.  Cardiovascular - Regular rhythm and rate.  Neuro - awake alert, following simple commands, however, severe dysarthria with intangible speech. PERRL, EOMI, denies diplopia. Right lower quadrantanopia. B/l lower facial weakness, R>L. Tongue protrusion impaired. RUE proximal 0/5 but bicep 3/5, and tricep 2/5 with finger grip 2/5. LUE proximal 0/5, bicep and tricep and finger grip 2-/5. RLE proximal 2/5 and distal 3/5, LLE proximal 2-/5 and distal 3/5. Bilateral babinski positive. DTR diminished. Subjectively sensation symmetrical. Coordination not able to test and gait not tested.     ASSESSMENT/PLAN Adrian [name] is a 54 y.o. male with history of back pain and very heavy tobacco smoking, presenting with dizziness, ataxia, right side weakness and severe dysarthria. He was outside of window for IV tPA.   Stroke: multifocal infarcts mostly in posterior circulation but also in bilateral anterior circulation white matter as well, cardioembolic pattern  CT multifocal areas of hypoattenuation scattered throughout the bilateral supratentorial brain and right cerebellum.   CTA head and neck aortic arch endoluminal thrombus, innominate artery occlusion,  left ICA thrombus versus soft plaque, right V4 and basal artery occlusive thrombus   CT Head repeat - 06/04/19 - Multifocal subacute infarcts involving the bilateral cerebrum, right cerebellum, left basal ganglia/thalamus and pons are unchanged. No intracranial hemorrhage.  MRI  numerous small supratentorial and infratentorial acute infarcts involving the cerebral hemispheres, left thalamus, pons and right cerebellar hemisphere.  This exam suggestive of embolic process.  2D Echo EF 65-70%  LDL 121, TG 414  HgbA1c 5.8  UDS - negative  Lovenox for VTE prophylaxis  None prior to admission, now on ASA + Plavix and heparin IV   Therapy recommendations:  CIR   Disposition:  Pending  Orthostatic hypotension BP dependent neuro decline  Two episodes of neuro decline with positioning change - orthostatic hypotension  Fluid bolus followed by IV fluid at 100  Off Levophed  Symptoms improved and subsequently resolved   Bed rest for now  Check orthostatic vitals with PT/OT on Monday  Hyperlipidemia  Home meds: none  LDL 121 and TG 400, LDL goal < 70  Lipitor 80  Continue statin at discharge  Tobacco abuse  Current IV smoker  Smoking cessation counseling provided  Nicotine patch provided  Pt is willing to quit  Other Stroke Risk Factors    Other Active Problems  Anxiety, threatening to leave AMA -on nicotine patch for cravings, on buspirone and Seroquel for anxiety  Severe chronic back pain and neuropathy- continue home gabapentin, steroids with taper  Hypokalemia - 3.3 - supplement  Leukocytosis - WBC 21.3-23.5-19.9-19.5 (afebrile) (on prednisone)  Hospital day # 3   This patient is critically ill and at significant risk of neurological worsening, death and care requires constant monitoring of vital signs, hemodynamics,respiratory and cardiac monitoring, extensive review of multiple databases, frequent neurological assessment, discussion with family, other  specialists and medical decision making of high complexity. I spent 35 minutes of neurocritical care time  in the care of  this patient. I had long discussion with wife and son at bedside, updated pt current condition, treatment plan and potential prognosis, and answered all the questions.  They expressed understanding and appreciation.   Marvel Plan, MD PhD Stroke Neurology 06/05/2019 7:33 PM   To contact Stroke Continuity provider, please refer to [email or web address]. After hours, contact General Neurology

## 2019-06-06 DIAGNOSIS — E782 Mixed hyperlipidemia: Secondary | ICD-10-CM

## 2019-06-06 LAB — BASIC METABOLIC PANEL
Anion gap: 9 (ref 5–15)
BUN: 13 mg/dL (ref 6–20)
CO2: 21 mmol/L — ABNORMAL LOW (ref 22–32)
Calcium: 8.3 mg/dL — ABNORMAL LOW (ref 8.9–10.3)
Chloride: 106 mmol/L (ref 98–111)
Creatinine, Ser: 0.78 mg/dL (ref 0.61–1.24)
GFR calc Af Amer: >60 mL/min (ref >60)
GFR calc non Af Amer: >60 mL/min (ref >60)
Glucose, Bld: 109 mg/dL — ABNORMAL HIGH (ref 70–99)
Potassium: 3.5 mmol/L (ref 3.5–5.1)
Sodium: 136 mmol/L (ref 135–145)

## 2019-06-06 LAB — CBC
HCT: 42.3 % (ref 39.0–52.0)
Hemoglobin: 14.4 g/dL (ref 13.0–17.0)
MCH: 30.4 pg (ref 26.0–34.0)
MCHC: 34 g/dL (ref 30.0–36.0)
MCV: 89.4 fL (ref 80.0–100.0)
Platelets: 225 10*3/uL (ref 150–400)
RBC: 4.73 MIL/uL (ref 4.22–5.81)
RDW: 14.2 % (ref 11.5–15.5)
WBC: 22 10*3/uL — ABNORMAL HIGH (ref 4.0–10.5)
nRBC: 0 % (ref 0.0–0.2)

## 2019-06-06 LAB — HEPARIN LEVEL (UNFRACTIONATED): Heparin Unfractionated: 0.35 IU/mL (ref 0.30–0.70)

## 2019-06-06 LAB — MAGNESIUM: Magnesium: 2.2 mg/dL (ref 1.7–2.4)

## 2019-06-06 MED ORDER — ORAL CARE MOUTH RINSE
15.0000 mL | Freq: Two times a day (BID) | OROMUCOSAL | Status: DC
  Administered 2019-06-06 – 2019-06-11 (×12): 15 mL via OROMUCOSAL

## 2019-06-06 NOTE — Progress Notes (Signed)
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 Codeine Other (See Comments)    unknown  . Motrin [Ibuprofen] Other (See Comments)    unknown  . Penicillins Other (See Comments)    unknown    Patient Measurements: Height: 5\' 7"  (170.2 cm) Weight: 71.1 kg (156 lb 12 oz) IBW/kg (Calculated) : 66.1 Heparin Dosing Weight: 71.1 kg   Vital Signs: Temp: 96.7 F (35.9 C) (04/18 0400) Temp Source: Axillary (04/18 0400) BP: 141/82 (04/18 0400) Pulse Rate: 58 (04/18 0400)  Labs: Recent Labs    06/04/19 0541 06/04/19 2058 06/05/19 0535 06/05/19 0535 06/05/19 1331 06/05/19 2005 06/06/19 0601  HGB 16.7  --  14.5  --   --   --  14.4  HCT 48.0  --  43.8  --   --   --  42.3  PLT 210  --  223  --   --   --  225  HEPARINUNFRC  --    < > 0.51   < > 0.43 0.37 0.35  CREATININE 0.87  --  0.86  --   --   --   --    < > = values in this interval not displayed.    Estimated Creatinine Clearance: 91.8 mL/min (by C-G formula based on SCr of 0.86 mg/dL).   Medical History: Past Medical History:  Diagnosis Date  . Tobacco abuse     Assessment: 54 yo male presented on 06/02/2019 with weakness and slurred speech concerning for stroke. 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 Today, patient found to be hypotensive (patient intermittently requiring norepinephrine pressor support) and difficult to arouse. Patient taken to STAT head CT and found to be negative for hemorrhage. Pharmacy consulted to dose heparin for stroke protocol for basilar and vertebral occlusion.  Heparin level this morning was 0.35 which is therapeutic (goal 0.3-0.5 units/mL) on heparin 950 units/hr. CBC stable. No reported bleeding or issues with IV  infusion/access per RN.   Goal of Therapy:  Heparin level 0.3 - 0.5 units/mL Monitor platelets by anticoagulation protocol: Yes   Plan:  Continue heparin at  950 units/hr  Monitor heparin level, CBC, and S/S of bleeding daily  Follow up length of therapy of heparin per Dr. 06/04/2019, PharmD PGY1 Pharmacy Resident [employer]: [phone]  06/06/2019,7:46 AM

## 2019-06-06 NOTE — Progress Notes (Signed)
[facility] continues to follow on Adrian [name] progress while in the ICU. His status continues to require ICU level care. We will resume care when neuro ICU assesses [name] as stable and is ready to transfer care back to [facility].   Please page [phone]   Genia Hotter, M.D.  Family Medicine  PGY-2 06/06/2019 6:26 AM

## 2019-06-06 NOTE — Progress Notes (Signed)
STROKE TEAM PROGRESS NOTE   INTERVAL HISTORY Patient RN is at the bedside. Pt sitting in chair, still severe dysarthria to anarthria, and quadriparesis. As per RN, pt has pathological crying yesterday and overnight. BP on the high end, neuro stable.   Vitals:   06/06/19 0000 06/06/19 0200 06/06/19 0400 06/06/19 0800  BP: 140/73 123/77 (!) 141/82 122/75  Pulse: (!) 58 (!) 57 (!) 58 69  Resp:   14 11  Temp: 98.1 F (36.7 C)  (!) 96.7 F (35.9 C)   TempSrc: Oral  Axillary   SpO2: 92% 91% 93% 93%  Weight:      Height:        CBC:  Recent Labs  Lab 06/02/19 1058 06/03/19 0657 06/05/19 0535 06/06/19 0601  WBC 21.3*   < > 19.5* 22.0*  NEUTROABS 13.8*  --   --   --   HGB 17.0   < > 14.5 14.4  HCT 50.6   < > 43.8 42.3  MCV 90.0   < > 90.9 89.4  PLT 293   < > 223 225   < > = values in this interval not displayed.    Basic Metabolic Panel:  Recent Labs  Lab 06/04/19 0541 06/04/19 0541 06/05/19 0535 06/06/19 0601  NA 140   < > 139 136  K 3.8   < > 3.3* 3.5  CL 109   < > 108 106  CO2 18*   < > 18* 21*  GLUCOSE 112*   < > 89 109*  BUN 15   < > 12 13  CREATININE 0.87   < > 0.86 0.78  CALCIUM 8.3*   < > 8.2* 8.3*  MG 2.1  --   --  2.2   < > = values in this interval not displayed.   Lipid Panel:     Component Value Date/Time   CHOL 221 (H) 06/02/2019 1830   TRIG 414 (H) 06/02/2019 1830   HDL 40 (L) 06/02/2019 1830   CHOLHDL 5.5 06/02/2019 1830   VLDL UNABLE TO CALCULATE IF TRIGLYCERIDE OVER 400 mg/dL 06/02/2019 1830   LDLCALC UNABLE TO CALCULATE IF TRIGLYCERIDE OVER 400 mg/dL 06/02/2019 1830   HgbA1c:  Lab Results  Component Value Date   HGBA1C 5.8 (H) 06/02/2019   Urine Drug Screen:     Component Value Date/Time   LABOPIA NONE DETECTED 06/02/2019 1400   COCAINSCRNUR NONE DETECTED 06/02/2019 1400   LABBENZ NONE DETECTED 06/02/2019 1400   AMPHETMU NONE DETECTED 06/02/2019 1400   THCU NONE DETECTED 06/02/2019 1400   LABBARB NONE DETECTED 06/02/2019 1400     Alcohol Level     Component Value Date/Time   ETH <10 06/02/2019 1058    IMAGING past 24 hours  DG CHEST PORT 1 VIEW  Result Date: 06/05/2019 CLINICAL DATA:  Leukocytosis. EXAM: PORTABLE CHEST 1 VIEW COMPARISON:  June 02, 2019. FINDINGS: The heart size and mediastinal contours are within normal limits. No pneumothorax or pleural effusion is noted. Mild bibasilar subsegmental atelectasis is noted. The visualized skeletal structures are unremarkable. IMPRESSION: Mild bibasilar subsegmental atelectasis. Electronically Signed   By: Marijo Conception M.D.   On: 06/05/2019 12:37    PHYSICAL EXAM  Temp:  [96.7 F (35.9 C)-98.5 F (36.9 C)] 96.7 F (35.9 C) (04/18 0400) Pulse Rate:  [55-74] 69 (04/18 0800) Resp:  [11-23] 11 (04/18 0800) BP: (116-164)/(62-98) 122/75 (04/18 0800) SpO2:  [91 %-97 %] 93 % (04/18 0800)  General - Well nourished, well developed,  in no apparent distress.  Ophthalmologic - fundi not visualized due to noncooperation.  Cardiovascular - Regular rhythm and rate.  Neuro - awake alert, following simple commands, however, severe dysarthria to anarthria with intangible speech. PERRL, EOMI, denies diplopia. Right lower quadrantanopia. B/l upper and lower facial weakness, R>L. Tongue protrusion impaired. RUE proximal 0/5 but bicep 3/5, and tricep 2/5 with finger grip 2/5. LUE proximal 0/5, bicep and tricep and finger grip 2-/5. RLE proximal 2/5 and distal 3/5, LLE proximal 2-/5 and distal 3/5. Bilateral babinski positive. DTR 1+. Subjectively sensation symmetrical. Coordination not able to test and gait not tested.     ASSESSMENT/PLAN [name] is a 54 y.o. male with history of back pain and very heavy tobacco smoking, presenting with dizziness, ataxia, right side weakness and severe dysarthria. He was outside of window for IV tPA.   Stroke: multifocal infarcts mostly in posterior circulation but also in bilateral anterior circulation white matter as well,  cardioembolic pattern  CT multifocal areas of hypoattenuation scattered throughout the bilateral supratentorial brain and right cerebellum.   CTA head and neck aortic arch endoluminal thrombus, innominate artery occlusion, left ICA thrombus versus soft plaque, right V4 and basal artery occlusive thrombus   CT Head repeat - 06/04/19 - Multifocal subacute infarcts involving the bilateral cerebrum, right cerebellum, left basal ganglia/thalamus and pons are unchanged. No intracranial hemorrhage.  MRI  numerous small supratentorial and infratentorial acute infarcts involving the cerebral hemispheres, left thalamus, pons and right cerebellar hemisphere.  This exam suggestive of embolic process.  2D Echo EF 65-70%  LDL 121, TG 414  HgbA1c 5.8  UDS - negative  Lovenox for VTE prophylaxis  None prior to admission, now on ASA + Plavix and heparin IV   Therapy recommendations:  CIR   Disposition:  Pending  Orthostatic hypotension BP dependent neuro decline  Two episodes of neuro decline with positioning change - orthostatic hypotension   Symptoms improved and subsequently resolved after fluid resuscitation   Fluid bolus followed by IV fluid at 100->75  Off Levophed  Bed rest for now  Head of bed 45 degree today  Check orthostatic vitals with PT/OT tomorrow  Hyperlipidemia  Home meds: none  LDL 121 and TG 400, LDL goal < 70  Lipitor 80  Continue statin at discharge  Tobacco abuse  Current IV smoker  Smoking cessation counseling provided  Nicotine patch provided  Pt is willing to quit  Pathological crying  RN reported inappropriate crying  Depression vs. Pathological crying  Could be related to pontine lesion  Continue monitoring  Other Stroke Risk Factors    Other Active Problems  Anxiety, threatening to leave AMA -on nicotine patch for cravings, on buspirone and Seroquel for anxiety  Severe chronic back pain and neuropathy- continue home  gabapentin, steroids with taper  Hypokalemia - 3.3 ->3.5 monitor  Leukocytosis - WBC 21.3-23.5-19.9-19.5->22.0 (afebrile) (on prednisone started 4/17 for 4 days)  Full Code  Hospital day # 4   This patient is critically ill and at significant risk of neurological worsening, death and care requires constant monitoring of vital signs, hemodynamics,respiratory and cardiac monitoring, extensive review of multiple databases, frequent neurological assessment, discussion with family, other specialists and medical decision making of high complexity. I spent 35 minutes of neurocritical care time  in the care of  this patient.  Marvel Plan, MD PhD Stroke Neurology 06/06/2019 9:49 AM   To contact Stroke Continuity provider, please refer to [email or web address]. After hours, contact General Neurology

## 2019-06-07 DIAGNOSIS — I771 Stricture of artery: Secondary | ICD-10-CM

## 2019-06-07 LAB — HEPARIN LEVEL (UNFRACTIONATED): Heparin Unfractionated: 0.29 IU/mL — ABNORMAL LOW (ref 0.30–0.70)

## 2019-06-07 LAB — BASIC METABOLIC PANEL
Anion gap: 14 (ref 5–15)
BUN: 15 mg/dL (ref 6–20)
CO2: 17 mmol/L — ABNORMAL LOW (ref 22–32)
Calcium: 8.8 mg/dL — ABNORMAL LOW (ref 8.9–10.3)
Chloride: 107 mmol/L (ref 98–111)
Creatinine, Ser: 0.84 mg/dL (ref 0.61–1.24)
GFR calc Af Amer: >60 mL/min (ref >60)
GFR calc non Af Amer: >60 mL/min (ref >60)
Glucose, Bld: 98 mg/dL (ref 70–99)
Potassium: 3.7 mmol/L (ref 3.5–5.1)
Sodium: 138 mmol/L (ref 135–145)

## 2019-06-07 LAB — CBC
HCT: 44.6 % (ref 39.0–52.0)
Hemoglobin: 14.9 g/dL (ref 13.0–17.0)
MCH: 30.7 pg (ref 26.0–34.0)
MCHC: 33.4 g/dL (ref 30.0–36.0)
MCV: 92 fL (ref 80.0–100.0)
Platelets: 240 10*3/uL (ref 150–400)
RBC: 4.85 MIL/uL (ref 4.22–5.81)
RDW: 14.4 % (ref 11.5–15.5)
WBC: 18.4 10*3/uL — ABNORMAL HIGH (ref 4.0–10.5)
nRBC: 0 % (ref 0.0–0.2)

## 2019-06-07 MED ORDER — RESOURCE THICKENUP CLEAR PO POWD
ORAL | Status: DC | PRN
  Filled 2019-06-07 (×2): qty 125

## 2019-06-07 MED ORDER — GABAPENTIN 300 MG PO CAPS
600.0000 mg | ORAL_CAPSULE | Freq: Three times a day (TID) | ORAL | Status: DC
  Administered 2019-06-07 – 2019-06-10 (×9): 600 mg via ORAL
  Filled 2019-06-07 (×9): qty 2

## 2019-06-07 NOTE — Progress Notes (Signed)
STROKE TEAM PROGRESS NOTE   INTERVAL HISTORY Wife and son are at bedside.  Patient awake alert, upset about not able to move arms or legs.  BP low on the right arm due to innominate artery thrombus.  Checked BP on the left arm, SBP 160s.  DC bedrest, working with PT OT.  Vitals:   06/07/19 0400 06/07/19 0712 06/07/19 0800 06/07/19 0803  BP: 139/69 96/68  97/76  Pulse: 67 80  89  Resp: 14 18  19   Temp: 97.9 F (36.6 C)  98.1 F (36.7 C)   TempSrc: Axillary     SpO2: 96% 96%  95%  Weight:      Height:        CBC:  Recent Labs  Lab 06/02/19 1058 06/03/19 0657 06/06/19 0601 06/07/19 0606  WBC 21.3*   < > 22.0* 18.4*  NEUTROABS 13.8*  --   --   --   HGB 17.0   < > 14.4 14.9  HCT 50.6   < > 42.3 44.6  MCV 90.0   < > 89.4 92.0  PLT 293   < > 225 240   < > = values in this interval not displayed.    Basic Metabolic Panel:  Recent Labs  Lab 06/04/19 0541 06/05/19 0535 06/06/19 0601 06/07/19 0606  NA 140   < > 136 138  K 3.8   < > 3.5 3.7  CL 109   < > 106 107  CO2 18*   < > 21* 17*  GLUCOSE 112*   < > 109* 98  BUN 15   < > 13 15  CREATININE 0.87   < > 0.78 0.84  CALCIUM 8.3*   < > 8.3* 8.8*  MG 2.1  --  2.2  --    < > = values in this interval not displayed.   Lipid Panel:     Component Value Date/Time   CHOL 221 (H) 06/02/2019 1830   TRIG 414 (H) 06/02/2019 1830   HDL 40 (L) 06/02/2019 1830   CHOLHDL 5.5 06/02/2019 1830   VLDL UNABLE TO CALCULATE IF TRIGLYCERIDE OVER 400 mg/dL 06/02/2019 1830   LDLCALC UNABLE TO CALCULATE IF TRIGLYCERIDE OVER 400 mg/dL 06/02/2019 1830   HgbA1c:  Lab Results  Component Value Date   HGBA1C 5.8 (H) 06/02/2019   Urine Drug Screen:     Component Value Date/Time   LABOPIA NONE DETECTED 06/02/2019 1400   COCAINSCRNUR NONE DETECTED 06/02/2019 1400   LABBENZ NONE DETECTED 06/02/2019 1400   AMPHETMU NONE DETECTED 06/02/2019 1400   THCU NONE DETECTED 06/02/2019 1400   LABBARB NONE DETECTED 06/02/2019 1400    Alcohol Level     Component Value Date/Time   ETH <10 06/02/2019 1058    IMAGING past 24 hours  No results found.  PHYSICAL EXAM   Temp:  [97.9 F (36.6 C)-98.7 F (37.1 C)] 98.1 F (36.7 C) (04/19 0800) Pulse Rate:  [59-94] 89 (04/19 0803) Resp:  [11-26] 19 (04/19 0803) BP: (96-182)/(67-133) 97/76 (04/19 0803) SpO2:  [88 %-97 %] 95 % (04/19 0803)  General - Well nourished, well developed, in no apparent distress.  Ophthalmologic - fundi not visualized due to noncooperation.  Cardiovascular - Regular rhythm and rate.  Neuro - awake alert, following simple commands, however, severe dysarthria to anarthria with intangible speech. PERRL, EOMI. Seems to have right lower quadrantanopia although not quiet cooperative on visual confrontation test. B/l upper and lower facial weakness, R>L. Tongue protrusion impaired. RUE proximal 0/5  but bicep 3/5, and tricep 1/5 with finger grip 2/5. LUE proximal 0/5, bicep 2/5 and tricep 1/5 and finger grip 2-/5. RLE proximal 2/5 and distal 3/5, LLE proximal 2-/5 and distal 3/5. Bilateral babinski positive. DTR 1+. Subjectively sensation symmetrical. Coordination not able to test and gait not tested.     ASSESSMENT/PLAN [name] is a 54 y.o. male with history of back pain and very heavy tobacco smoking, presenting with dizziness, ataxia, right side weakness and severe dysarthria. He was outside of window for IV tPA.   Stroke: multifocal infarcts mostly in posterior circulation but also in bilateral anterior circulation white matter as well, cardioembolic pattern  CT multifocal areas of hypoattenuation scattered throughout the bilateral supratentorial brain and right cerebellum.   CTA head and neck aortic arch endoluminal thrombus, innominate artery occlusion, left ICA thrombus versus soft plaque, right V4 and basal artery occlusive thrombus   CT Head repeat - 06/04/19 - Multifocal subacute infarcts involving the bilateral cerebrum, right cerebellum, left  basal ganglia/thalamus and pons are unchanged. No intracranial hemorrhage.  MRI  numerous small supratentorial and infratentorial acute infarcts involving the cerebral hemispheres, left thalamus, pons and right cerebellar hemisphere.  This exam suggestive of embolic process.  2D Echo EF 65-70%  LDL 121, TG 414  HgbA1c 5.8  UDS - negative  Lovenox for VTE prophylaxis  None prior to admission, now on ASA + Plavix and heparin IV. Continue triple therapy for now, and will transition to ASA and eliquis in 2-3 days.    Therapy recommendations:  CIR   Disposition:  Pending  Orthostatic hypotension BP dependent neuro decline  Two episodes of neuro decline with positioning change - orthostatic hypotension   Symptoms improved and subsequently resolved after fluid resuscitation   Fluid bolus followed by IV fluid at 100->75  Off Levophed  Off bedrest  Low BP check on right arm due to innominate artery occlusion, recommend to check BP on left arm for accuracy.  Check orthostatic vitals with PT/OT today  Hyperlipidemia  Home meds: none  LDL 121 and TG 400, LDL goal < 70  Lipitor 80  Continue statin at discharge  Tobacco abuse  Current IV smoker  Smoking cessation counseling provided  Nicotine patch provided  Pt is willing to quit  Other Stroke Risk Factors    Other Active Problems  Anxiety, threatening to leave AMA -on nicotine patch for cravings, on buspirone and Seroquel for anxiety  Severe chronic back pain and neuropathy- continue home gabapentin, steroids with taper  Hypokalemia - 3.3 ->3.5 monitor  Leukocytosis - WBC 21.3-23.5-19.9-19.5->22.0->18.4 (afebrile) (on prednisone started 4/17 for 4 days)  Full Code  Hospital day # 5   I spent 35 minutes in total face-to-face time with the patient, more than 50% of which was spent in counseling and coordination of care, reviewing test results, images and medication, and discussing the diagnosis of posterior  circulation strokes, basilar artery occlusion, innominate artery occlusion, orthostatic hypotension, treatment plan and potential prognosis. This patient's care requiresreview of multiple databases, neurological assessment, discussion with family, other specialists and medical decision making of high complexity. I had long discussion with wife and son at bedside, updated pt current condition, treatment plan and potential prognosis, and answered all the questions.  They expressed understanding and appreciation.    Marvel Plan, MD PhD Stroke Neurology 06/07/2019 8:49 AM   To contact Stroke Continuity provider, please refer to [email or web address]. After hours, contact General Neurology

## 2019-06-07 NOTE — Progress Notes (Signed)
  Speech Language Pathology Treatment: Dysphagia  Patient Details Name: Adrian [name] MRN: [id number] DOB: 11/20/1965 Today's Date: 06/07/2019 Time: 4709-6283 SLP Time Calculation (min) (ACUTE ONLY): 15 min  Assessment / Plan / Recommendation Clinical Impression  Pt was seen for dysphagia tx with his wife and son present. Skilled observation was provided as [name] fundraiser administered meds crushed in puree. Pt needs Mod cues for awareness of oral residue, noted bilaterally although R > L. Pt could reduce but not eliminate his oral residue with a lingual sweep, ultimately needing manual removal by SLP. Liquid washes were not attempted as pt declined all of them offered. Occasional, delayed coughing was noted today that was not noted on Saturday with purees. Suspect this could be related to positioning in bed. Attempted to raise the [facility] as far as he would allow, but pt has c/o back pain and does not like to get fully upright. He does not maintain upright position for long either, repeatedly asking to recline.   Swallowing strategies were reviewed with pt and family as well as current diet recommendations. We reviewed that his liquids need to be thickened and verbally reviewed instructions for how to thicken the gatorade that pt likes to drink. Would keep current diet in place with as much adherence to precautions as possible - I suspect that positioning may be the most challenging one for him to manage. Of note, pt was also on the bedpan and did not want to complete speech/language evaluation at this time. Will f/u for that and for additional swallowing therapy.    HPI HPI: This is a 54 year old gentleman with sudden onset of dizziness followed by dysarthria and right-sided weakness.  MRI confirms both supratentorial and infratentorial bilateral scattered infarcts.  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 MRI  brain-numerous small supratentorial and infratentorial acute infarcts involving the cerebral hemispheres, left thalamus, pons and right cerebellar hemisphere.       SLP Plan  Continue with current plan of care       Recommendations  Diet recommendations: Dysphagia 1 (puree);Nectar-thick liquid Liquids provided via: Cup;Straw Medication Administration: Crushed with puree Supervision: Staff to assist with self feeding;Full supervision/cueing for compensatory strategies Compensations: Minimize environmental distractions;Slow rate;Small sips/bites Postural Changes and/or Swallow Maneuvers: Seated upright 90 degrees;Upright 30-60 min after meal                Oral Care Recommendations: Oral care before and after PO Follow up Recommendations: Inpatient Rehab SLP Visit Diagnosis: Dysphagia, oral phase (R13.11);Dysphagia, oropharyngeal phase (R13.12) Plan: Continue with current plan of care       GO                 Mahala Menghini., M.A. CCC-SLP Acute Rehabilitation Services Pager [phone] Office [phone]  06/07/2019, 10:10 AM

## 2019-06-07 NOTE — Progress Notes (Signed)
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 Codeine Other (See Comments)    unknown  . Motrin [Ibuprofen] Other (See Comments)    unknown  . Penicillins Other (See Comments)    unknown    Patient Measurements: Height: 5\' 7"  (170.2 cm) Weight: 71.1 kg (156 lb 12 oz) IBW/kg (Calculated) : 66.1 Heparin Dosing Weight: 71.1 kg   Vital Signs: Temp: 98.2 F (36.8 C) (04/19 1200) Temp Source: Axillary (04/19 0400) BP: 97/76 (04/19 0803) Pulse Rate: 89 (04/19 0803)  Labs: Recent Labs    06/05/19 0535 06/05/19 0535 06/05/19 1331 06/05/19 2005 06/06/19 0601 06/07/19 0606  HGB 14.5   < >  --   --  14.4 14.9  HCT 43.8  --   --   --  42.3 44.6  PLT 223  --   --   --  225 240  HEPARINUNFRC 0.51  --    < > 0.37 0.35 0.29*  CREATININE 0.86  --   --   --  0.78 0.84   < > = values in this interval not displayed.    Estimated Creatinine Clearance: 94 mL/min (by C-G formula based on SCr of 0.84 mg/dL).   Medical History: Past Medical History:  Diagnosis Date  . Tobacco abuse     Assessment: 54 yo male presented on 06/02/2019 with weakness and slurred speech concerning for stroke. 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 Today, patient found to be hypotensive (patient intermittently requiring norepinephrine pressor support) and difficult to arouse. Patient taken to STAT head CT and found to be negative for hemorrhage. Pharmacy consulted to dose heparin for stroke protocol for basilar and vertebral occlusion.  Heparin level this morning was slightly low at 0.29  Cbc stable  Goal of Therapy:  Heparin level 0.3 - 0.5 units/mL Monitor platelets by anticoagulation protocol: Yes   Plan:  Increase heparin to 1000  units/hr Monitor heparin level, CBC, and S/S of bleeding daily  Follow up length of therapy of heparin per Dr. 06/04/2019, PharmD, BCPS, BCCCP Clinical Pharmacist [phone]  Please check AMION for all [facility] numbers  06/07/2019 12:59 PM

## 2019-06-07 NOTE — Progress Notes (Signed)
The FMTS continues to follow at a distance.  We will be happy to resume care if/when the patient is transferred out of the Neuro ICU.

## 2019-06-07 NOTE — Progress Notes (Signed)
Report called to receiving RN 3w30. Patient appears comfortable with family at the bedside. Will transfer in bed.

## 2019-06-07 NOTE — Progress Notes (Signed)
Physical Therapy Treatment Patient Details Name: Adrian [name] MRN: [id number] DOB: 09/03/1965 Today's Date: 06/07/2019    History of Present Illness 54 y.o. male admitted on 06/02/19 for dysarthria and weakness.  Pt's MRI revealed numerous small supatentorial and infratentorial acute infarcts involving the verebral hemispheres, left thalamus, pons and right cerebellar hemisphere (embolic).  No tPA given due to outside of window.  Pt with significant PMH of tobacco abuse and chronic low back pain.     PT Comments    Pt was able to sit EOB and transfer OOB to chair using the maxi sky total lift.  He did not move any extremity to command except to shrug right shoulder and move head.  I did not see any spontaneous movement of his extremities either.  This is a significant change from evaluation (and given he was placed back on bedrest Friday with fluctuating neuro status).  He is significantly more impaired than last week.  Family (son, Peyton Najjar and his wife) are very hands on and helpful with all of his mobility.  PT will continue to follow acutely for safe mobility progression.   Follow Up Recommendations  CIR     Equipment Recommendations  Rolling walker with 5" wheels;3in1 (PT)    Recommendations for Other Services Rehab consult     Precautions / Restrictions Precautions Precautions: Fall;Other (comment) Precaution Comments: check BP on left arm due to occlusion on R side.    Mobility  Bed Mobility Overal bed mobility: Needs Assistance Bed Mobility: Rolling;Sidelying to Sit;Sit to Supine Rolling: Total assist;+2 for physical assistance Sidelying to sit: +2 for physical assistance;Total assist   Sit to supine: +2 for physical assistance;Total assist   General bed mobility comments: Two person total assist to come to sitting EOB after rolling several times for BM on bed pan.  Assist needed at trunk and to move legs over the side of the bed.  I did not see any activation of muscles  in  all 4 extremities during EOB and return to bed mobility.   Transfers Overall transfer level: Needs assistance               General transfer comment: Used maxi sky total lift for OOB to chair for increased upright posture, repositioning.   Ambulation/Gait             General Gait Details: unable at this time.        Modified Rankin (Stroke Patients Only) Modified Rankin (Stroke Patients Only) Pre-Morbid Rankin Score: No symptoms Modified Rankin: Severe disability     Balance Overall balance assessment: Needs assistance Sitting-balance support: Feet supported;Bilateral upper extremity supported;No upper extremity supported Sitting balance-Leahy Scale: Zero Sitting balance - Comments: total assist EOB, when assist lessened I did not feel any trunk righting reactions on either side.  Pt is able to hold his head/cervical spine upright.                                     Cognition Arousal/Alertness: Awake/alert Behavior During Therapy: (emotionally labile) Overall Cognitive Status: Difficult to assess Area of Impairment: Problem solving;Following commands                 Orientation Level: (seems to recognize family) Current Attention Level: Sustained Memory: Decreased recall of precautions;Decreased short-term memory Following Commands: Follows one step commands inconsistently;Follows one step commands with increased time(to the best of his physical ability) Safety/Judgement:  Decreased awareness of safety;Decreased awareness of deficits Awareness: Intellectual Problem Solving: Requires verbal cues;Requires tactile cues;Decreased initiation;Difficulty sequencing General Comments: Pt with increased speech deficits, no longer able to get much more out than "yeah", does better with yes/no and did pretty well when asked to blink twice for yes and once for no         General Comments General comments (skin integrity, edema, etc.): When asked to  move all 4 extremities, pt unable to even tracely move bil arms, legs.  He can shrug his right shoulder (not left), and look around the room in all directions.  Orthostatic BPs (unable to do standing) were negative via left arm cuff. Family (son Willian and wife) were very hands on with physical assist.       Pertinent Vitals/Pain Pain Assessment: Faces Faces Pain Scale: Hurts even more Pain Location: back-chronic Pain Descriptors / Indicators: Discomfort Pain Intervention(s): Limited activity within patient's tolerance;Monitored during session;Repositioned           PT Goals (current goals can now be found in the care plan section) Acute Rehab PT Goals Patient Stated Goal: unable to state due to significant dysarthria Progress towards PT goals: Progressing toward goals    Frequency    Min 4X/week      PT Plan Current plan remains appropriate       AM-PAC PT "6 Clicks" Mobility   Outcome Measure  Help needed turning from your back to your side while in a flat bed without using bedrails?: Total Help needed moving from lying on your back to sitting on the side of a flat bed without using bedrails?: Total Help needed moving to and from a bed to a chair (including a wheelchair)?: Total Help needed standing up from a chair using your arms (e.g., wheelchair or bedside chair)?: Total Help needed to walk in hospital room?: Total Help needed climbing 3-5 steps with a railing? : Total 6 Click Score: 6    End of Session   Activity Tolerance: Patient tolerated treatment well Patient left: in chair;with call bell/phone within reach;with family/visitor present Nurse Communication: Mobility status PT Visit Diagnosis: Muscle weakness (generalized) (M62.81);Difficulty in walking, not elsewhere classified (R26.2);Hemiplegia and hemiparesis Hemiplegia - Right/Left: Right Hemiplegia - dominant/non-dominant: Dominant Hemiplegia - caused by: Cerebral infarction     Time: 1300-1411 PT  Time Calculation (min) (ACUTE ONLY): 71 min  Charges:  $Therapeutic Activity: 68-82 mins                    Verdene Lennert, PT, DPT  Acute Rehabilitation #[phone] pager #[phone] office     06/07/2019, 4:41 PM

## 2019-06-08 ENCOUNTER — Inpatient Hospital Stay (HOSPITAL_COMMUNITY): Payer: Medicaid Other

## 2019-06-08 LAB — BASIC METABOLIC PANEL
Anion gap: 13 (ref 5–15)
BUN: 18 mg/dL (ref 6–20)
CO2: 18 mmol/L — ABNORMAL LOW (ref 22–32)
Calcium: 9 mg/dL (ref 8.9–10.3)
Chloride: 108 mmol/L (ref 98–111)
Creatinine, Ser: 0.87 mg/dL (ref 0.61–1.24)
GFR calc Af Amer: >60 mL/min (ref >60)
GFR calc non Af Amer: >60 mL/min (ref >60)
Glucose, Bld: 111 mg/dL — ABNORMAL HIGH (ref 70–99)
Potassium: 3.6 mmol/L (ref 3.5–5.1)
Sodium: 139 mmol/L (ref 135–145)

## 2019-06-08 LAB — CBC
HCT: 45.7 % (ref 39.0–52.0)
Hemoglobin: 15.3 g/dL (ref 13.0–17.0)
MCH: 30.1 pg (ref 26.0–34.0)
MCHC: 33.5 g/dL (ref 30.0–36.0)
MCV: 89.8 fL (ref 80.0–100.0)
Platelets: 250 10*3/uL (ref 150–400)
RBC: 5.09 MIL/uL (ref 4.22–5.81)
RDW: 14.4 % (ref 11.5–15.5)
WBC: 23.9 10*3/uL — ABNORMAL HIGH (ref 4.0–10.5)
nRBC: 0 % (ref 0.0–0.2)

## 2019-06-08 LAB — HEPARIN LEVEL (UNFRACTIONATED)
Heparin Unfractionated: <0.1 IU/mL — ABNORMAL LOW (ref 0.30–0.70)
Heparin Unfractionated: 0.17 IU/mL — ABNORMAL LOW (ref 0.30–0.70)
Heparin Unfractionated: 0.57 IU/mL (ref 0.30–0.70)

## 2019-06-08 IMAGING — DX DG CHEST 1V PORT
1 series · 1 of 1 positions shown · non-contrast
Comparison: Chest radiograph dated [DATE].

CLINICAL DATA: 53-year-old male with leukocytosis.

EXAM:
PORTABLE CHEST 1 VIEW

[chest ap]
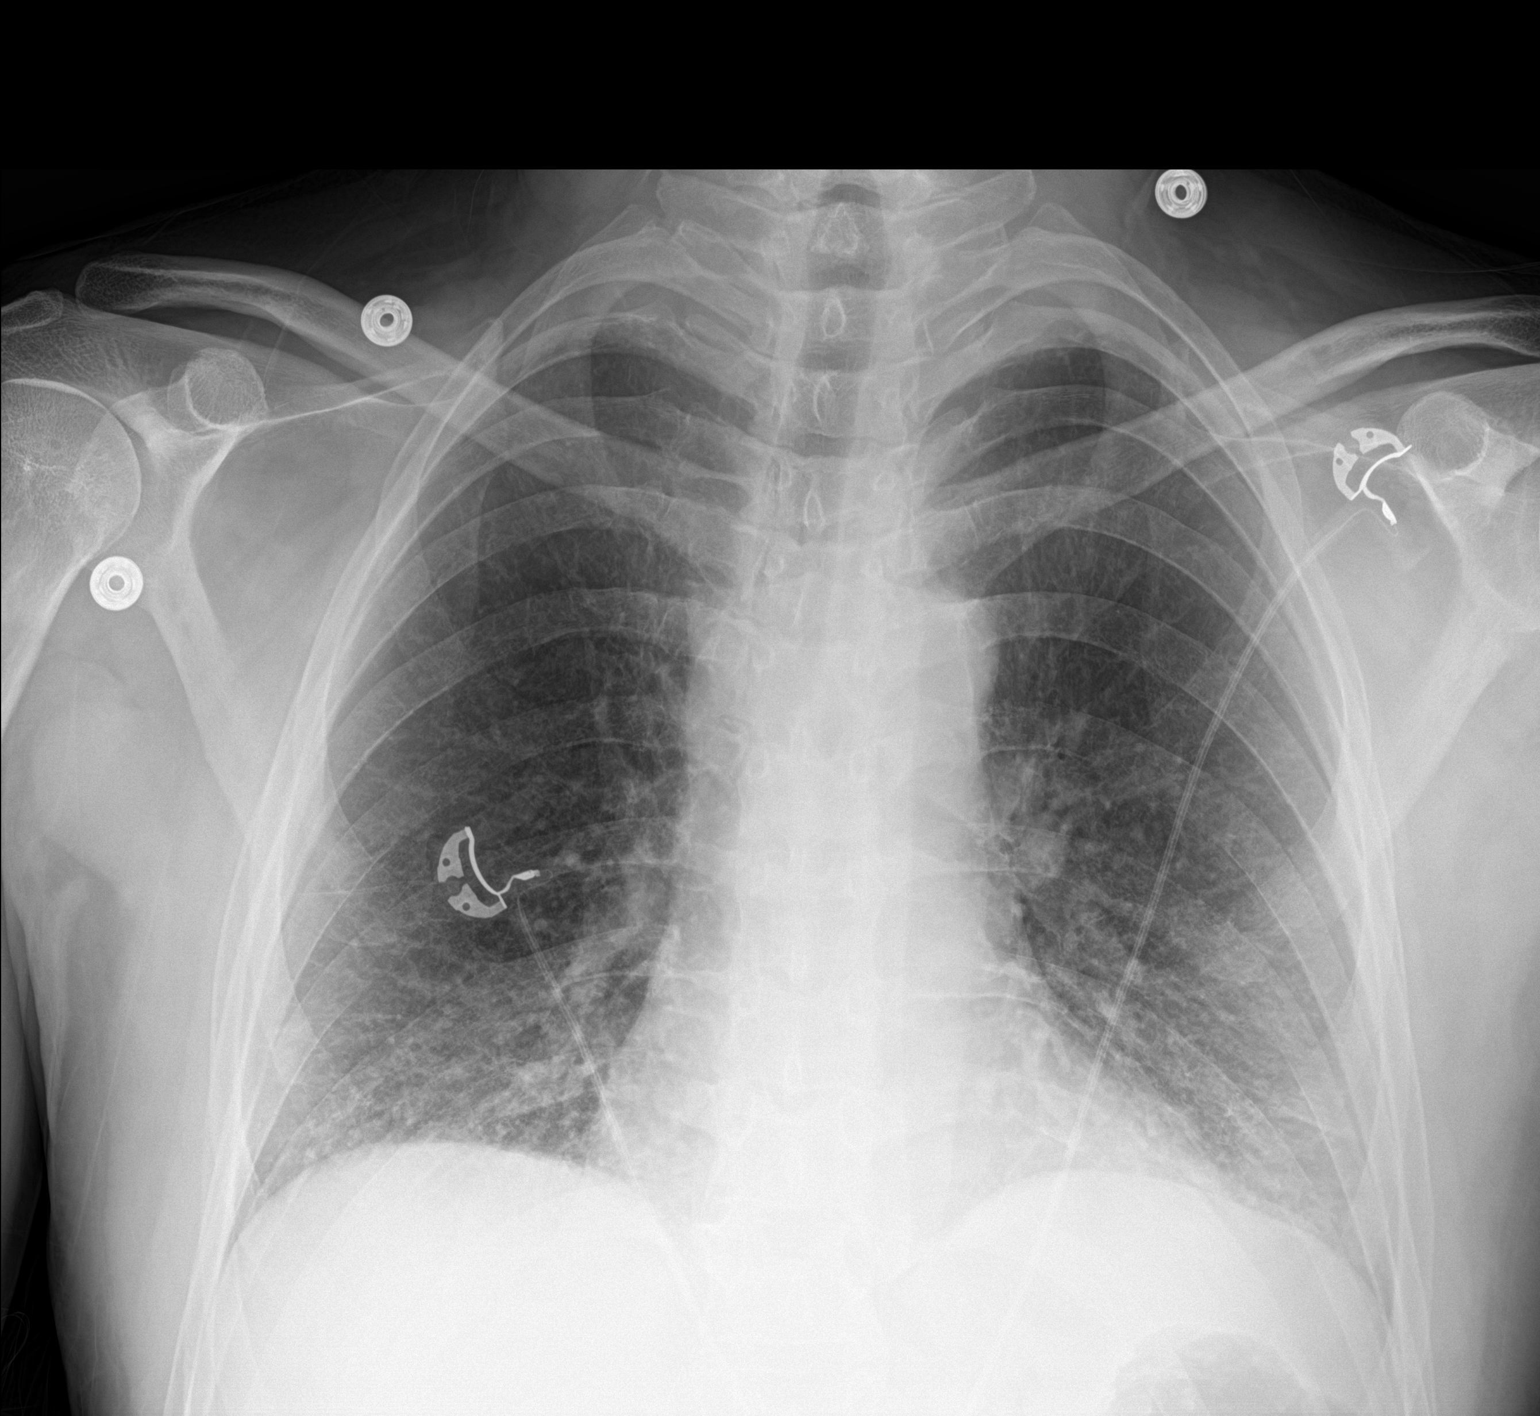

[1 of 1 positions shown; findings below may reference images not displayed]

FINDINGS: There is mild chronic interstitial coarsening and bronchitic
changes. Bibasilar streaky densities likely atelectasis/scarring. No
focal consolidation, pleural effusion or pneumothorax. The cardiac
silhouette is within normal limits. No acute osseous pathology.
IMPRESSION: No focal consolidation.

## 2019-06-08 MED ORDER — SODIUM CHLORIDE 3 % IN NEBU
4.0000 mL | INHALATION_SOLUTION | RESPIRATORY_TRACT | Status: AC
  Administered 2019-06-08 – 2019-06-11 (×11): 4 mL via RESPIRATORY_TRACT
  Filled 2019-06-08 (×15): qty 4

## 2019-06-08 NOTE — TOC Initial Note (Signed)
Transition of Care [facility]) - Initial/Assessment Note    Patient Details  Name: Adrian [name] MRN: [id number] Date of Birth: [birth year]  Transition of Care [facility]) CM/SW Contact:    Pollie Friar, RN Phone Number: 06/08/2019, 11:29 AM  Clinical Narrative:                 CM met with the patient, spouse, son at the bedside. Pt with aphasia and unable to participate in conversation.  Spouse and son interested in [place] program.  Pt doesn't have insurance. Financial counseling to see. Pt does have PCP: Dr Rock Nephew and family wants to continue with them. CM encouraged the family to make sure Dr Rock Nephew is using either the $ [place] list or trying to keep the cost of pts medications low as possible.  Awaiting CIR consult.   Expected Discharge Plan: IP Rehab Facility Barriers to Discharge: Continued Medical Work up   Patient Goals and CMS Choice   CMS Medicare.gov Compare Post Acute Care list provided to:: Patient Represenative (must comment) Choice offered to / list presented to : Spouse  Expected Discharge Plan and Services Expected Discharge Plan: [place]   Discharge Planning Services: CM Consult   Living arrangements for the past 2 months: Single Family Home                                      Prior Living Arrangements/Services Living arrangements for the past 2 months: Single Family Home Lives with:: Spouse Patient language and need for interpreter reviewed:: Yes        Need for Family Participation in Patient Care: Yes (Comment) Care giver support system in place?: Yes (comment)(family is able to provide 24 hour supervision and assistance) Current home services: DME(walker, 3 in 1, cane) Criminal Activity/Legal Involvement Pertinent to Current Situation/Hospitalization: No - Comment as needed  Activities of Daily Living   ADL Screening (condition at time of admission) Patient's cognitive ability adequate to safely complete daily activities?: Yes Is the  patient deaf or have difficulty hearing?: No Does the patient have difficulty seeing, even when wearing glasses/contacts?: No Does the patient have difficulty concentrating, remembering, or making decisions?: No Patient able to express need for assistance with ADLs?: No Does the patient have difficulty dressing or bathing?: Yes Independently performs ADLs?: No Does the patient have difficulty walking or climbing stairs?: Yes Weakness of Legs: Both Weakness of Arms/Hands: Both  Permission Sought/Granted                  Emotional Assessment Appearance:: Appears stated age   Affect (typically observed): Anxious Orientation: : Oriented to Self   Psych Involvement: No (comment)  Admission diagnosis:  Basilar artery occlusion [I65.1] Vomiting [R11.10] CVA (cerebral vascular accident) ([place]) [I63.9] Cerebrovascular accident (CVA) due to embolism of cerebral artery ([place]) [I63.40] Patient Active Problem List   Diagnosis Date Noted  . Chronic right-sided low back pain with right-sided sciatica   . Tobacco abuse   . Leukocytosis   . Prediabetes   . CVA (cerebral vascular accident) ([place]) 06/02/2019  . Basilar artery occlusion 06/02/2019   PCP:  Suzan Garibaldi, FNP Pharmacy:   [facility] [address] [place] [address] Phone: [phone] Fax: [phone]  [place], [place] [address]  Phone: [phone] Fax: [phone]     Social Determinants of Health (SDOH) Interventions    Readmission Risk Interventions No flowsheet data found.

## 2019-06-08 NOTE — Progress Notes (Signed)
ANTICOAGULATION CONSULT NOTE - Follow Up Consult  Pharmacy Consult for Heparin Indication: stroke - basilar and vertebral artery occlusion   Assessment: 54 yo male presented on 06/02/2019 with weakness and slurred speech concerning for stroke. 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 Today, patient found to be hypotensive (patient intermittently requiring norepinephrine pressor support) and difficult to arouse. Patient taken to STAT head CT and found to be negative for hemorrhage. Pharmacy consulted to dose heparin for stroke protocol for basilar and vertebral occlusion. Heparin level 0.57 units/ml   Goal of Therapy:  Heparin level 0.3 - 0.5 units/mL Monitor platelets by anticoagulation protocol: Yes   Plan:  Decrease heparin to 1150 units/hr Monitor heparin level, CBC, and S/S of bleeding daily  Follow up length of therapy of heparin per Dr. Roda Shutters Thanks for allowing pharmacy to be a part of this patient's care.  Talbert Cage, PharmD Clinical Pharmacist 06/08/2019 11:03 PM

## 2019-06-08 NOTE — Progress Notes (Signed)
Physical Therapy Treatment Patient Details Name: Adrian [name] MRN: [id number] DOB: 01/08/1966 Today's Date: 06/08/2019    History of Present Illness 54 y.o. male admitted on 06/02/19 for dysarthria and weakness.  Pt's MRI revealed numerous small supatentorial and infratentorial acute infarcts involving the verebral hemispheres, left thalamus, pons and right cerebellar hemisphere (embolic).  No tPA given due to outside of window.  Pt with significant PMH of tobacco abuse and chronic low back pain. Neuro changes the evening of 06/03/19--pt placed back on bedrest until 06/07/19.      PT Comments    Patient seen for mobility progression. Pt requires +2 assist for bed mobility and +1 assist for sitting balance EOB. Pt able to maintain cervical extension/forward gaze in sitting. Prior to sitting up minimal movement of R UE/LE noted with multimodal cues and assist to facilitate.  PT will continue to follow acutely and progress as tolerated.   Follow Up Recommendations  CIR     Equipment Recommendations  Other (comment)(TBD pending progress)    Recommendations for Other Services       Precautions / Restrictions Precautions Precautions: Fall;Other (comment) Precaution Comments: check BP on left arm due to occlusion on R side.    Mobility  Bed Mobility Overal bed mobility: Needs Assistance Bed Mobility: Rolling;Sidelying to Sit;Sit to Sidelying Rolling: Total assist;+2 for physical assistance Sidelying to sit: +2 for physical assistance;Total assist     Sit to sidelying: Total assist;+2 for physical assistance General bed mobility comments: total A +2 for all bed mobility; minimal movement on R UE and LE with multimodal cues/assist prior to sitting EOB but not while sitting EOB   Transfers                    Ambulation/Gait                 Stairs             Wheelchair Mobility    Modified Rankin (Stroke Patients Only) Modified Rankin (Stroke Patients  Only) Pre-Morbid Rankin Score: No symptoms Modified Rankin: Severe disability     Balance Overall balance assessment: Needs assistance Sitting-balance support: Feet supported;No upper extremity supported;Bilateral upper extremity supported Sitting balance-Leahy Scale: Zero Sitting balance - Comments: good ability to maintain cervical extension/forward gaze in sitting; no activity noted at trunk for righting reactions                                    Cognition Arousal/Alertness: Awake/alert Behavior During Therapy: (emotionally labile) Overall Cognitive Status: Difficult to assess Area of Impairment: Problem solving;Following commands                 Orientation Level: (seems to recognize family)     Following Commands: Follows one step commands inconsistently;Follows one step commands with increased time(to the best of his physical ability)     Problem Solving: Requires verbal cues;Requires tactile cues;Decreased initiation;Difficulty sequencing General Comments: pt nodding head yes/no in response to questions; crying at times seemingly in response to mobility impairment      Exercises      General Comments General comments (skin integrity, edema, etc.): SBP 150 in sitting       Pertinent Vitals/Pain Pain Assessment: Faces Faces Pain Scale: Hurts little more Pain Location: back-chronic Pain Descriptors / Indicators: Discomfort Pain Intervention(s): Monitored during session;Repositioned    Home Living     Available Help  at Discharge: Family Type of Home: House              Prior Function            PT Goals (current goals can now be found in the care plan section) Progress towards PT goals: Progressing toward goals    Frequency    Min 4X/week      PT Plan Current plan remains appropriate    Co-evaluation              AM-PAC PT "6 Clicks" Mobility   Outcome Measure  Help needed turning from your back to your side  while in a flat bed without using bedrails?: Total Help needed moving from lying on your back to sitting on the side of a flat bed without using bedrails?: Total Help needed moving to and from a bed to a chair (including a wheelchair)?: Total Help needed standing up from a chair using your arms (e.g., wheelchair or bedside chair)?: Total Help needed to walk in hospital room?: Total Help needed climbing 3-5 steps with a railing? : Total 6 Click Score: 6    End of Session   Activity Tolerance: Patient tolerated treatment well Patient left: with call bell/phone within reach;with family/visitor present;in bed;with bed alarm set;Other (comment)(bed in chair position) Nurse Communication: Mobility status PT Visit Diagnosis: Muscle weakness (generalized) (M62.81);Difficulty in walking, not elsewhere classified (R26.2);Hemiplegia and hemiparesis Hemiplegia - Right/Left: Right Hemiplegia - dominant/non-dominant: Dominant Hemiplegia - caused by: Cerebral infarction     Time: 9767-3419 PT Time Calculation (min) (ACUTE ONLY): 32 min  Charges:  $Therapeutic Activity: 23-37 mins                     Erline Levine, PTA Acute Rehabilitation Services Pager: [phone] Office: [phone]     Carolynne Edouard 06/08/2019, 2:54 PM

## 2019-06-08 NOTE — Discharge Summary (Addendum)
Family Medicine Teaching [facility] Discharge Summary  Patient name: Adrian [name] Medical record number: [id number] Date of birth: 09/27/1965 Age: 54 y.o. Gender: male Date of Admission: 06/02/2019  Date of Discharge: 06/12/2019 Admitting Physician: Dana Allan, MD  Primary Care Provider: Marin Comment, FNP Consultants: Neurology, CCM  Indication for Hospitalization: Stroke  Discharge Diagnoses/Problem List:  Multifocal CVA Orthostatic hypotension Blood pressure dependent neurologic decline Hyperlipidemia Tobacco abuse  Disposition: CIR  Discharge Condition: Stable  Discharge Exam: 06/12/2019  General: Alert and in no apparent distress  Cardiovascular: RRR with no murmurs noted Respiratory:good A/E bilaterally, upper airway congestion. No IWOB Gastrointestinal: Bowel sounds present. No abdominal pain Extremities: no lower extremity edema Derm: No rashes noted Neuro: Alert and obeys commands.  Severe dysarthria and weakness in all extremities. Psych: Behavior and speech appropriate to situation  Brief Hospital Course:  Adrian [name] is a 54 y.o. male with past medical history significant for heavy tobacco use (4 packs/day) and chronic back pain on chronic prednisone, who presented with sudden onset dizziness, vomiting, diffuse generalized weakness and severe dysarthria and found to have multifocal subacute infarcts involving the bilateral cerebrum, rightcerebellum, left basal ganglia/thalamus and ponswithout signs of acute hemorrhage. His last known normal was over 24 hours prior to admission placing patient out of TPA window. MRI confirmed both supratentorial and infratentorial bilateral scattered infarcts.  Initially he was admitted for a suspected embolic work-up, however on CTA it was revealed that he had basilar artery and innominate artery occlusion. MRI brain notable for multiple posterior circulation strokes with numerous small supratentorial and infratentorial acute  infarcts involving the cerebral hemispheres, left thalamus, pons and right cerebellar hemisphere. On admission patient had persistent hypotension despite 3L of fluid bolus in the setting of chronic use of prednisone for back pain (suspect some form of adrenal insufficiency contributing). He was thus transferred to ICU for pressure support in order to maintain adequate cerebral perfusion.  He remained in the ICU from 4/14 to 4/19 in which he was able to maintain adequate pressures independently and was safely transferred to the floor. During this time he was started on high dose statin and triple therapy of ASA, Plavix, and IV heparin until finally being transitioned to ASA and Eliquis by day of discharge. He had echocardiogram that was notable for EF of 65-70%, otherwise unremarkable. Hgb A1C 5.8.  Extensive smoking cessation education was provided throughout admission and he was started on Bupropion to help with cessation.   During admission patient experienced 2 episodes of neurologic decline with positioning changes in the setting of orthostatic hypotension.  His symptoms improved with lying supine and fluid resuscitation. He was found to have low BP checks on the right arm due to innominate artery occulusion, thus it was recommended he have blood pressures obtained from the left arm for accuracy.   During admission, patient continued to have severe dysarthria to anarthria and quadriparesis. He had repeat CTA on 4/21 that showed no. Additionally, he initially passed his swallow study and was tolerating a dysphagia 1 diet but later developed difficulties with maintaining his secretions thus had barium swallow on 4/21 which was unchanged from previous study.  He was able to tolerate puree diet with honey thickened fluids. He was started on Remeron 15 mg qhs to help improve appetite.  Patient worked with physical therapy and Occupational Therapy daily who recommended inpatient rehab for further rehabilitation.  Patient should continue to have SLP evaluation to determine best diet for patient given his severe  dysarthria and intermittent aspirations.   Tobacco Use: Patient was noted to be a heavy tobacco user consisting of 4 packs/day likely over 20 years.  Extensive smoking cessation education was provided throughout admission.  He was also started on bupropion to aid in his cessation.  Chronic Back Pain: Patient was noted to be on prednisone for ~4-5 weeks for management of chronic back pain.  Appears patient is treated with steroids due to an underlying allergy to ibuprofen.  Steroid taper was completed during admission.  Issues for Follow Up:  1. Monitor Blood Pressure 2. Medications initiated: Atorvastatin, Wellbutrin, Plavix, ASA, Neurontin, Nicoderm patch, Protonix, Remeron 3. Encourage Smoking Cessation  Significant Procedures: None   Significant Labs and Imaging:  Recent Labs  Lab 06/09/19 0427 06/10/19 0546 06/12/19 0420  WBC 20.1* 19.4* 15.5*  HGB 14.5 14.3 14.4  HCT 43.4 42.9 43.6  PLT 233 239 284   Recent Labs  Lab 06/06/19 0601 06/06/19 0601 06/07/19 0606 06/07/19 0606 06/08/19 0507 06/08/19 0507 06/09/19 0427 06/09/19 0427 06/10/19 0546 06/12/19 0420  NA 136   < > 138  --  139  --  140  --  139 143  K 3.5   < > 3.7   < > 3.6   < > 3.5   < > 3.7 3.9  CL 106   < > 107  --  108  --  109  --  108 108  CO2 21*   < > 17*  --  18*  --  21*  --  19* 24  GLUCOSE 109*   < > 98  --  111*  --  104*  --  93 108*  BUN 13   < > 15  --  18  --  20  --  17 23*  CREATININE 0.78   < > 0.84  --  0.87  --  0.78  --  0.76 0.80  CALCIUM 8.3*   < > 8.8*  --  9.0  --  8.7*  --  8.8* 9.1  MG 2.2  --   --   --   --   --   --   --   --   --    < > = values in this interval not displayed.   Ethanol: <10 Cortisol: 10.6 Procalcitonin: <0.10 HIV: NR Total CK 118 A1C: 5.8 TSH 2.069 COVID: Negative Blood smear: absolute lymphocytosis  Lipid Panel     Component Value Date/Time   CHOL  221 (H) 06/02/2019 1830   TRIG 414 (H) 06/02/2019 1830   HDL 40 (L) 06/02/2019 1830   CHOLHDL 5.5 06/02/2019 1830   VLDL UNABLE TO CALCULATE IF TRIGLYCERIDE OVER 400 mg/dL 16/11/9602 5409   LDLCALC UNABLE TO CALCULATE IF TRIGLYCERIDE OVER 400 mg/dL 81/19/1478 2956   LDLDIRECT 121.3 (H) 06/02/2019 1830   Urinalysis    Component Value Date/Time   COLORURINE YELLOW 06/02/2019 1400   APPEARANCEUR CLEAR 06/02/2019 1400   LABSPEC 1.020 06/02/2019 1400   PHURINE 6.0 06/02/2019 1400   GLUCOSEU NEGATIVE 06/02/2019 1400   HGBUR NEGATIVE 06/02/2019 1400   BILIRUBINUR NEGATIVE 06/02/2019 1400   KETONESUR NEGATIVE 06/02/2019 1400   PROTEINUR NEGATIVE 06/02/2019 1400   NITRITE NEGATIVE 06/02/2019 1400   LEUKOCYTESUR NEGATIVE 06/02/2019 1400   Drugs of Abuse     Component Value Date/Time   LABOPIA NONE DETECTED 06/02/2019 1400   COCAINSCRNUR NONE DETECTED 06/02/2019 1400   LABBENZ NONE DETECTED 06/02/2019 1400   AMPHETMU NONE DETECTED 06/02/2019  1400   THCU NONE DETECTED 06/02/2019 1400   LABBARB NONE DETECTED 06/02/2019 1400    CT ANGIO HEAD W OR WO CONTRAST  Result Date: 06/09/2019 CLINICAL DATA:  Follow-up examination for stroke. EXAM: CT ANGIOGRAPHY HEAD AND NECK TECHNIQUE: Multidetector CT imaging of the head and neck was performed using the standard protocol during bolus administration of intravenous contrast. Multiplanar CT image reconstructions and MIPs were obtained to evaluate the vascular anatomy. Carotid stenosis measurements (when applicable) are obtained utilizing NASCET criteria, using the distal internal carotid diameter as the denominator. CONTRAST:  80mL OMNIPAQUE IOHEXOL 350 MG/ML SOLN COMPARISON:  Prior CTA from 06/02/2019. FINDINGS: CT HEAD FINDINGS Brain: Normal expected interval evolution of multifocal posterior circulation infarcts, relatively stable in size and morphology as compared to previous exams. No evidence for hemorrhagic transformation or other complication. No  significant mass effect. Additional chronic infarcts involving the right basal ganglia, right parietal lobe, and right cerebellum noted. No acute intracranial hemorrhage. No new large vessel territory infarct. No mass lesion or midline shift. No hydrocephalus or extra-axial fluid collection. Vascular: Hyperdensity within the proximal basilar, consistent with previously identified thrombus. Skull: Unremarkable. Sinuses: Paranasal sinuses and mastoid air cells remain largely clear. Orbits: Globes orbital soft tissues demonstrate no acute finding. Review of the MIP images confirms the above findings CTA NECK FINDINGS Aortic arch: Aortic arch not included on this exam. 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 Distal reconstitution at the origin of the right subclavian and right common carotid artery. Subclavian arteries otherwise patent with no new finding. Right carotid system: Right common and internal carotid arteries remain patent to the skull base without significant stenosis, dissection, or occlusion. Mild atheromatous irregularity about the right bifurcation without significant stenosis. Left carotid system: Left CCA widely patent proximally. Eccentric soft plaque within the mid left common carotid artery with relatively mild stenosis noted, unchanged. Left common and internal carotid arteries otherwise patent without significant stenosis, dissection, or occlusion. Vertebral arteries: Both vertebral arteries arise from the subclavian arteries. Soft plaque at the origin of the right vertebral artery with associated mild-to-moderate stenosis. Attenuated flow seen within the distal right V2 and V3 segments, which otherwise remain patent to the skull base. Minimal plaque at the origin of the left vertebral artery with no more than mild stenosis. Slightly dominant left vertebral artery otherwise widely patent within the neck. Skeleton: Mild multilevel cervical spondylosis without significant stenosis.  Other neck: No other new soft tissue abnormality within the neck. Subcentimeter hypodense right thyroid nodule noted, of doubtful significance given size and patient age. No follow-up imaging recommended regarding this lesion. Upper chest: Mild dependent atelectatic changes seen within the visualized lungs. Small amount of layering secretions noted within the subglottic airway. Visualized upper chest demonstrates no other acute abnormality. Review of the MIP images confirms the above findings CTA HEAD FINDINGS Anterior circulation: Internal carotid arteries remain widely patent to the termini without stenosis. A1 segments, anterior communicating artery common anterior cerebral arteries widely patent. No M1 stenosis. Negative MCA bifurcations. Distal MCA branches remain well perfused and symmetric. Posterior circulation: Vertebral arteries patent as they course into the cranial vault. Occlusive thrombus again seen within the distal V4 segments bilaterally, right greater than left, little interval change from previous. Left PICA remains patent. Right PICA not seen. Occlusive clot extends into the proximal and mid basilar artery, little interval change from previous. Neither AICA is well seen. Distal reconstitution at the distal aspect of the basilar artery. Superior cerebral arteries well perfused  bilaterally. Both PCAs primarily supplied via the basilar, although small bilateral posterior communicating arteries are seen, left slightly larger than right. PCAs mildly irregular but remain widely patent and well perfused to their distal aspects. Venous sinuses: Grossly patent allowing for timing the contrast bolus. Anatomic variants: None significant. Review of the MIP images confirms the above findings IMPRESSION: CT HEAD IMPRESSION: 1. Normal expected interval evolution of scattered posterior circulation infarcts, grossly stable in size and distribution as compared to previous. No evidence for hemorrhagic  transformation or other complication. 2. No other new acute intracranial abnormality. CTA HEAD AND NECK IMPRESSION: 1. Stable CTA of the head and neck with persistent occlusive thrombus involving the distal V4 segments as well as the proximal-mid basilar artery, little interval changed in appearance as compared to previous. Distal reconstitution at the distal basilar artery, likely via the anterior circulation via small bilateral posterior communicating arteries. Both PCAs and SCAs remain well perfused proximally. Right PICA not well visualized as before. 2. 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 3. Mild to moderate stenoses about the origins of the vertebral arteries bilaterally, right greater than left. 4. Otherwise wide patency of the major arterial vasculature of the head and neck. No other new or progressive finding. Electronically Signed   By: Rise Mu M.D.   On: 06/09/2019 03:11   DG Chest 2 View  Result Date: 06/11/2019 CLINICAL DATA:  Congestion with coughing. EXAM: CHEST - 2 VIEW COMPARISON:  06/08/2019 FINDINGS: Low lung volumes. Cardiopericardial silhouette is at upper limits of normal for size. There is pulmonary vascular congestion without overt pulmonary edema. Interstitial markings are diffusely coarsened with chronic features. Streaky atelectasis or scarring at the left base is similar to prior. No dense airspace consolidation. No pleural effusion. The visualized bony structures of the thorax are intact. Telemetry leads overlie the chest. IMPRESSION: Low lung volumes with vascular congestion. No overt pulmonary edema or focal airspace consolidation. Streaky opacity at the left base is similar to prior and likely reflects atelectasis or scarring. Electronically Signed   By: Kennith Center M.D.   On: 06/11/2019 10:11   CT ANGIO NECK W OR WO CONTRAST  Result Date: 06/09/2019 CLINICAL DATA:  Follow-up examination for stroke. EXAM: CT ANGIOGRAPHY HEAD AND NECK  TECHNIQUE: Multidetector CT imaging of the head and neck was performed using the standard protocol during bolus administration of intravenous contrast. Multiplanar CT image reconstructions and MIPs were obtained to evaluate the vascular anatomy. Carotid stenosis measurements (when applicable) are obtained utilizing NASCET criteria, using the distal internal carotid diameter as the denominator. CONTRAST:  80mL OMNIPAQUE IOHEXOL 350 MG/ML SOLN COMPARISON:  Prior CTA from 06/02/2019. FINDINGS: CT HEAD FINDINGS Brain: Normal expected interval evolution of multifocal posterior circulation infarcts, relatively stable in size and morphology as compared to previous exams. No evidence for hemorrhagic transformation or other complication. No significant mass effect. Additional chronic infarcts involving the right basal ganglia, right parietal lobe, and right cerebellum noted. No acute intracranial hemorrhage. No new large vessel territory infarct. No mass lesion or midline shift. No hydrocephalus or extra-axial fluid collection. Vascular: Hyperdensity within the proximal basilar, consistent with previously identified thrombus. Skull: Unremarkable. Sinuses: Paranasal sinuses and mastoid air cells remain largely clear. Orbits: Globes orbital soft tissues demonstrate no acute finding. Review of the MIP images confirms the above findings CTA NECK FINDINGS Aortic arch: Aortic arch not included on this exam. 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 Distal reconstitution at the origin of the right  subclavian and right common carotid artery. Subclavian arteries otherwise patent with no new finding. Right carotid system: Right common and internal carotid arteries remain patent to the skull base without significant stenosis, dissection, or occlusion. Mild atheromatous irregularity about the right bifurcation without significant stenosis. Left carotid system: Left CCA widely patent proximally. Eccentric soft plaque  within the mid left common carotid artery with relatively mild stenosis noted, unchanged. Left common and internal carotid arteries otherwise patent without significant stenosis, dissection, or occlusion. Vertebral arteries: Both vertebral arteries arise from the subclavian arteries. Soft plaque at the origin of the right vertebral artery with associated mild-to-moderate stenosis. Attenuated flow seen within the distal right V2 and V3 segments, which otherwise remain patent to the skull base. Minimal plaque at the origin of the left vertebral artery with no more than mild stenosis. Slightly dominant left vertebral artery otherwise widely patent within the neck. Skeleton: Mild multilevel cervical spondylosis without significant stenosis. Other neck: No other new soft tissue abnormality within the neck. Subcentimeter hypodense right thyroid nodule noted, of doubtful significance given size and patient age. No follow-up imaging recommended regarding this lesion. Upper chest: Mild dependent atelectatic changes seen within the visualized lungs. Small amount of layering secretions noted within the subglottic airway. Visualized upper chest demonstrates no other acute abnormality. Review of the MIP images confirms the above findings CTA HEAD FINDINGS Anterior circulation: Internal carotid arteries remain widely patent to the termini without stenosis. A1 segments, anterior communicating artery common anterior cerebral arteries widely patent. No M1 stenosis. Negative MCA bifurcations. Distal MCA branches remain well perfused and symmetric. Posterior circulation: Vertebral arteries patent as they course into the cranial vault. Occlusive thrombus again seen within the distal V4 segments bilaterally, right greater than left, little interval change from previous. Left PICA remains patent. Right PICA not seen. Occlusive clot extends into the proximal and mid basilar artery, little interval change from previous. Neither AICA is  well seen. Distal reconstitution at the distal aspect of the basilar artery. Superior cerebral arteries well perfused bilaterally. Both PCAs primarily supplied via the basilar, although small bilateral posterior communicating arteries are seen, left slightly larger than right. PCAs mildly irregular but remain widely patent and well perfused to their distal aspects. Venous sinuses: Grossly patent allowing for timing the contrast bolus. Anatomic variants: None significant. Review of the MIP images confirms the above findings IMPRESSION: CT HEAD IMPRESSION: 1. Normal expected interval evolution of scattered posterior circulation infarcts, grossly stable in size and distribution as compared to previous. No evidence for hemorrhagic transformation or other complication. 2. No other new acute intracranial abnormality. CTA HEAD AND NECK IMPRESSION: 1. Stable CTA of the head and neck with persistent occlusive thrombus involving the distal V4 segments as well as the proximal-mid basilar artery, little interval changed in appearance as compared to previous. Distal reconstitution at the distal basilar artery, likely via the anterior circulation via small bilateral posterior communicating arteries. Both PCAs and SCAs remain well perfused proximally. Right PICA not well visualized as before. 2. 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 3. Mild to moderate stenoses about the origins of the vertebral arteries bilaterally, right greater than left. 4. Otherwise wide patency of the major arterial vasculature of the head and neck. No other new or progressive finding. Electronically Signed   By: Rise Mu M.D.   On: 06/09/2019 03:11   CT ANGIO HEAD W OR WO CONTRAST  Result Date: 06/09/2019 CLINICAL DATA:  Follow-up examination for stroke. EXAM: CT ANGIOGRAPHY  HEAD AND NECK TECHNIQUE: Multidetector CT imaging of the head and neck was performed using the standard protocol during bolus administration of  intravenous contrast. Multiplanar CT image reconstructions and MIPs were obtained to evaluate the vascular anatomy. Carotid stenosis measurements (when applicable) are obtained utilizing NASCET criteria, using the distal internal carotid diameter as the denominator. CONTRAST:  80mL OMNIPAQUE IOHEXOL 350 MG/ML SOLN COMPARISON:  Prior CTA from 06/02/2019. FINDINGS: CT HEAD FINDINGS Brain: Normal expected interval evolution of multifocal posterior circulation infarcts, relatively stable in size and morphology as compared to previous exams. No evidence for hemorrhagic transformation or other complication. No significant mass effect. Additional chronic infarcts involving the right basal ganglia, right parietal lobe, and right cerebellum noted. No acute intracranial hemorrhage. No new large vessel territory infarct. No mass lesion or midline shift. No hydrocephalus or extra-axial fluid collection. Vascular: Hyperdensity within the proximal basilar, consistent with previously identified thrombus. Skull: Unremarkable. Sinuses: Paranasal sinuses and mastoid air cells remain largely clear. Orbits: Globes orbital soft tissues demonstrate no acute finding. Review of the MIP images confirms the above findings CTA NECK FINDINGS Aortic arch: Aortic arch not included on this exam. 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 Distal reconstitution at the origin of the right subclavian and right common carotid artery. Subclavian arteries otherwise patent with no new finding. Right carotid system: Right common and internal carotid arteries remain patent to the skull base without significant stenosis, dissection, or occlusion. Mild atheromatous irregularity about the right bifurcation without significant stenosis. Left carotid system: Left CCA widely patent proximally. Eccentric soft plaque within the mid left common carotid artery with relatively mild stenosis noted, unchanged. Left common and internal carotid arteries  otherwise patent without significant stenosis, dissection, or occlusion. Vertebral arteries: Both vertebral arteries arise from the subclavian arteries. Soft plaque at the origin of the right vertebral artery with associated mild-to-moderate stenosis. Attenuated flow seen within the distal right V2 and V3 segments, which otherwise remain patent to the skull base. Minimal plaque at the origin of the left vertebral artery with no more than mild stenosis. Slightly dominant left vertebral artery otherwise widely patent within the neck. Skeleton: Mild multilevel cervical spondylosis without significant stenosis. Other neck: No other new soft tissue abnormality within the neck. Subcentimeter hypodense right thyroid nodule noted, of doubtful significance given size and patient age. No follow-up imaging recommended regarding this lesion. Upper chest: Mild dependent atelectatic changes seen within the visualized lungs. Small amount of layering secretions noted within the subglottic airway. Visualized upper chest demonstrates no other acute abnormality. Review of the MIP images confirms the above findings CTA HEAD FINDINGS Anterior circulation: Internal carotid arteries remain widely patent to the termini without stenosis. A1 segments, anterior communicating artery common anterior cerebral arteries widely patent. No M1 stenosis. Negative MCA bifurcations. Distal MCA branches remain well perfused and symmetric. Posterior circulation: Vertebral arteries patent as they course into the cranial vault. Occlusive thrombus again seen within the distal V4 segments bilaterally, right greater than left, little interval change from previous. Left PICA remains patent. Right PICA not seen. Occlusive clot extends into the proximal and mid basilar artery, little interval change from previous. Neither AICA is well seen. Distal reconstitution at the distal aspect of the basilar artery. Superior cerebral arteries well perfused bilaterally. Both  PCAs primarily supplied via the basilar, although small bilateral posterior communicating arteries are seen, left slightly larger than right. PCAs mildly irregular but remain widely patent and well perfused to their distal aspects. Venous sinuses: Grossly  patent allowing for timing the contrast bolus. Anatomic variants: None significant. Review of the MIP images confirms the above findings IMPRESSION: CT HEAD IMPRESSION: 1. Normal expected interval evolution of scattered posterior circulation infarcts, grossly stable in size and distribution as compared to previous. No evidence for hemorrhagic transformation or other complication. 2. No other new acute intracranial abnormality. CTA HEAD AND NECK IMPRESSION: 1. Stable CTA of the head and neck with persistent occlusive thrombus involving the distal V4 segments as well as the proximal-mid basilar artery, little interval changed in appearance as compared to previous. Distal reconstitution at the distal basilar artery, likely via the anterior circulation via small bilateral posterior communicating arteries. Both PCAs and SCAs remain well perfused proximally. Right PICA not well visualized as before. 2. 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 3. Mild to moderate stenoses about the origins of the vertebral arteries bilaterally, right greater than left. 4. Otherwise wide patency of the major arterial vasculature of the head and neck. No other new or progressive finding. Electronically Signed   By: Rise Mu M.D.   On: 06/09/2019 03:11   CT ANGIO HEAD W OR WO CONTRAST  Result Date: 06/02/2019 CLINICAL DATA:  Stroke suspected. EXAM: CT ANGIOGRAPHY HEAD AND NECK TECHNIQUE: Multidetector CT imaging of the head and neck was performed using the standard protocol during bolus administration of intravenous contrast. Multiplanar CT image reconstructions and MIPs were obtained to evaluate the vascular anatomy. Carotid stenosis measurements (when  applicable) are obtained utilizing NASCET criteria, using the distal internal carotid diameter as the denominator. CONTRAST:  OMNIPAQUE IOHEXOL 350 MG/ML SOLN COMPARISON:  Brain MRI 06/02/2019. FINDINGS: CTA NECK FINDINGS Aortic arch: Standard aortic branching. There is an eccentric curvilinear filling defect within the lumen of aortic arch measuring 2.0 x 4.7 cm (series 7, image 344). Findings likely reflect eccentric thrombus. Additionally, there is occlusive thrombus within the proximal to mid innominate artery. There is reconstitution of flow at the level of the distal innominate artery. No significant proximal subclavian stenosis. Right carotid system: CCA and ICA patent within the neck without significant stenosis (50% or greater). Left carotid system: CCA and ICA patent within the neck without significant stenosis (50% or greater). Eccentric hypodensity within the mid left common carotid artery has an appearance most suggestive of soft plaque. Eccentric thrombus cannot be definitively excluded. Vertebral arteries: The vertebral artery is slightly dominant. The vertebral arteries are patent within the neck. Soft plaque at the vertebral artery origins bilaterally with mild ostial stenosis on the left Skeleton: No acute bony abnormality or aggressive osseous lesion. Other neck: No neck mass or cervical lymphadenopathy. Chronic fracture deformity of the T1 spinous process Upper chest: No consolidation within the imaged lung apices. Review of the MIP images confirms the above findings CTA HEAD FINDINGS Anterior circulation: The intracranial internal carotid arteries are patent without significant stenosis. The M1 middle cerebral arteries are patent without significant stenosis. No M2 proximal branch occlusion or high-grade proximal stenosis is identified. The anterior cerebral arteries are patent without significant proximal stenosis. No intracranial aneurysm is identified. Posterior circulation: There is  occlusive thrombus within the distal V4 right vertebral artery. Occlusive thrombus is also suspected within the distal V4 left vertebral artery. Occlusive thrombus extends into the proximal and mid basilar artery. There is reconstitution of flow within the distal basilar artery. Intermittent and irregular opacification of the right PICA is seen and this vessel likely a contains multifocal thrombus. Flow is seen within the proximal left PICA.  The AICA vessels are not well delineated. Flow is seen within the superior cerebellar arteries bilaterally. The posterior cerebral arteries are patent proximally without significant stenosis. However, emboli are suspected within the distal posterior cerebral arteries bilaterally given the pattern of acute infarcts demonstrated on brain MRI performed earlier the same day. Posterior communicating arteries are not definitively identified. Venous sinuses: Within limitations of contrast timing, no convincing thrombus. Anatomic variants: As described. Review of the MIP images confirms the above findings These results were called by telephone at the time of interpretation on 06/02/2019 at 7:11 pm to provider Doralee Albino , who verbally acknowledged these results. IMPRESSION: CTA neck: 1. Eccentric endoluminal thrombus within the aortic arch as described. 2. 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 3. The bilateral common and internal carotid arteries are patent within the neck without significant stenosis. Eccentric hypodensity within the mid left common carotid artery has an appearance most suggestive of soft plaque. Eccentric thrombus at this site cannot be definitively excluded. 4. The vertebral arteries are patent within the neck bilaterally. Soft plaque at both vertebral artery origins with mild ostial stenosis on the left. CTA head: 1. Occlusive thrombus within the distal V4 right vertebral artery with occlusive thrombus extending into the proximal and  mid basilar artery. Occlusive thrombus is also suspected within the very distal V4 left vertebral artery. There is reconstitution of flow within the distal basilar artery. 2. Only intermittent and irregular enhancement of the right PICA is seen and there is suspected multifocal thrombus within this vessel. 3. The bilateral AICAs are poorly delineated. 4. The posterior cerebral arteries are patent proximally without significant stenosis. However, emboli are suspected within the distal PCAs bilaterally given the pattern of acute infarcts demonstrated on same-day brain MRI. 5. No anterior circulation large vessel occlusion or proximal high-grade stenosis is identified. Electronically Signed   By: Jackey Loge DO   On: 06/02/2019 19:30   DG Chest 2 View  Result Date: 06/11/2019 CLINICAL DATA:  Congestion with coughing. EXAM: CHEST - 2 VIEW COMPARISON:  06/08/2019 FINDINGS: Low lung volumes. Cardiopericardial silhouette is at upper limits of normal for size. There is pulmonary vascular congestion without overt pulmonary edema. Interstitial markings are diffusely coarsened with chronic features. Streaky atelectasis or scarring at the left base is similar to prior. No dense airspace consolidation. No pleural effusion. The visualized bony structures of the thorax are intact. Telemetry leads overlie the chest. IMPRESSION: Low lung volumes with vascular congestion. No overt pulmonary edema or focal airspace consolidation. Streaky opacity at the left base is similar to prior and likely reflects atelectasis or scarring. Electronically Signed   By: Kennith Center M.D.   On: 06/11/2019 10:11   CT HEAD WO CONTRAST  Result Date: 06/04/2019 CLINICAL DATA:  Subacute neurological change. EXAM: CT HEAD WITHOUT CONTRAST TECHNIQUE: Contiguous axial images were obtained from the base of the skull through the vertex without intravenous contrast. COMPARISON:  06/04/2019 head CT and MRI head. FINDINGS: Brain: Similar appearance of  multifocal bilateral cerebral and right cerebellar infarcts also involving the left basal ganglia, left thalamus and pons. No new hypodense foci. No intracranial hemorrhage. No mass lesion. No midline shift, ventriculomegaly or extra-axial fluid collection. Vascular: No hyperdense vessel or unexpected calcification. Skull: Negative for fracture or focal lesion. Sinuses/Orbits: Normal orbits. Clear paranasal sinuses. No mastoid effusion. Other: None. IMPRESSION: Multifocal subacute infarcts involving the bilateral cerebrum, right cerebellum, left basal ganglia/thalamus and pons are unchanged. No intracranial hemorrhage. Electronically Signed  By: Stana Bunting M.D.   On: 06/04/2019 15:03   CT HEAD WO CONTRAST  Result Date: 06/04/2019 CLINICAL DATA:  Stroke follow-up EXAM: CT HEAD WITHOUT CONTRAST TECHNIQUE: Contiguous axial images were obtained from the base of the skull through the vertex without intravenous contrast. COMPARISON:  Brain MRI 06/02/2019 FINDINGS: Brain: Unchanged appearance of multifocal subacute infarcts scattered throughout the brain, involving both hemispheres, multiple vascular territories and the cerebellum. No hemorrhage or mass effect. Vascular: No hyperdense vessel or unexpected calcification. Skull: Normal. Negative for fracture or focal lesion. Sinuses/Orbits: No acute finding. Other: None. IMPRESSION: 1. Unchanged appearance of multifocal subacute infarcts scattered throughout the brain, involving both hemispheres and the cerebellum. 2. No hemorrhage or mass effect. Electronically Signed   By: Deatra Robinson M.D.   On: 06/04/2019 00:09   CT HEAD WO CONTRAST  Result Date: 06/02/2019 CLINICAL DATA:  Right arm weakness, slurred speech, unequal pupils EXAM: CT HEAD WITHOUT CONTRAST TECHNIQUE: Contiguous axial images were obtained from the base of the skull through the vertex without intravenous contrast. COMPARISON:  None. FINDINGS: Brain: Multifocal somewhat rounded areas of  hypoattenuation scattered throughout the brain including within the high right parietal lobe (series 3, image 6), left periventricular white matter (series 3, image 13), bilateral occipital lobes (series 3, images 20 and 22), and right cerebellum (series 3, image 27). There may be mild vasogenic edema associated with the right parietal abnormality. No intracranial hemorrhage or extra-axial collection is seen. No hydrocephalus. Focal encephalomalacia adjacent to the frontal horn of the right lateral ventricle may represent prior lacunar infarct with associated ex vacuo dilatation. Vascular: No hyperdense vessel or unexpected calcification. Skull: Normal. Negative for fracture or focal lesion. Sinuses/Orbits: Small mucous retention cyst versus sinonasal polyp within the inferior right maxillary sinus. Remaining paranasal sinuses are clear. Orbital structures appear intact and unremarkable. Other: None. IMPRESSION: Multifocal areas of hypoattenuation scattered throughout the bilateral supratentorial brain and right cerebellum. There may be mild associated vasogenic edema with a high right parietal lesion. Findings are suspicious for multifocal brain metastases. Further evaluation with contrast-enhanced MRI of the brain is recommended. These results were called by telephone at the time of interpretation on 06/02/2019 at 11:40 am to provider [facility] , who verbally acknowledged these results. Electronically Signed   By: Duanne Guess D.O.   On: 06/02/2019 11:42   CT ANGIO NECK W OR WO CONTRAST  Result Date: 06/09/2019 CLINICAL DATA:  Follow-up examination for stroke. EXAM: CT ANGIOGRAPHY HEAD AND NECK TECHNIQUE: Multidetector CT imaging of the head and neck was performed using the standard protocol during bolus administration of intravenous contrast. Multiplanar CT image reconstructions and MIPs were obtained to evaluate the vascular anatomy. Carotid stenosis measurements (when applicable) are obtained  utilizing NASCET criteria, using the distal internal carotid diameter as the denominator. CONTRAST:  80mL OMNIPAQUE IOHEXOL 350 MG/ML SOLN COMPARISON:  Prior CTA from 06/02/2019. FINDINGS: CT HEAD FINDINGS Brain: Normal expected interval evolution of multifocal posterior circulation infarcts, relatively stable in size and morphology as compared to previous exams. No evidence for hemorrhagic transformation or other complication. No significant mass effect. Additional chronic infarcts involving the right basal ganglia, right parietal lobe, and right cerebellum noted. No acute intracranial hemorrhage. No new large vessel territory infarct. No mass lesion or midline shift. No hydrocephalus or extra-axial fluid collection. Vascular: Hyperdensity within the proximal basilar, consistent with previously identified thrombus. Skull: Unremarkable. Sinuses: Paranasal sinuses and mastoid air cells remain largely clear. Orbits: Globes orbital soft tissues demonstrate no  acute finding. Review of the MIP images confirms the above findings CTA NECK FINDINGS Aortic arch: Aortic arch not included on this exam. 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 Distal reconstitution at the origin of the right subclavian and right common carotid artery. Subclavian arteries otherwise patent with no new finding. Right carotid system: Right common and internal carotid arteries remain patent to the skull base without significant stenosis, dissection, or occlusion. Mild atheromatous irregularity about the right bifurcation without significant stenosis. Left carotid system: Left CCA widely patent proximally. Eccentric soft plaque within the mid left common carotid artery with relatively mild stenosis noted, unchanged. Left common and internal carotid arteries otherwise patent without significant stenosis, dissection, or occlusion. Vertebral arteries: Both vertebral arteries arise from the subclavian arteries. Soft plaque at the origin  of the right vertebral artery with associated mild-to-moderate stenosis. Attenuated flow seen within the distal right V2 and V3 segments, which otherwise remain patent to the skull base. Minimal plaque at the origin of the left vertebral artery with no more than mild stenosis. Slightly dominant left vertebral artery otherwise widely patent within the neck. Skeleton: Mild multilevel cervical spondylosis without significant stenosis. Other neck: No other new soft tissue abnormality within the neck. Subcentimeter hypodense right thyroid nodule noted, of doubtful significance given size and patient age. No follow-up imaging recommended regarding this lesion. Upper chest: Mild dependent atelectatic changes seen within the visualized lungs. Small amount of layering secretions noted within the subglottic airway. Visualized upper chest demonstrates no other acute abnormality. Review of the MIP images confirms the above findings CTA HEAD FINDINGS Anterior circulation: Internal carotid arteries remain widely patent to the termini without stenosis. A1 segments, anterior communicating artery common anterior cerebral arteries widely patent. No M1 stenosis. Negative MCA bifurcations. Distal MCA branches remain well perfused and symmetric. Posterior circulation: Vertebral arteries patent as they course into the cranial vault. Occlusive thrombus again seen within the distal V4 segments bilaterally, right greater than left, little interval change from previous. Left PICA remains patent. Right PICA not seen. Occlusive clot extends into the proximal and mid basilar artery, little interval change from previous. Neither AICA is well seen. Distal reconstitution at the distal aspect of the basilar artery. Superior cerebral arteries well perfused bilaterally. Both PCAs primarily supplied via the basilar, although small bilateral posterior communicating arteries are seen, left slightly larger than right. PCAs mildly irregular but remain  widely patent and well perfused to their distal aspects. Venous sinuses: Grossly patent allowing for timing the contrast bolus. Anatomic variants: None significant. Review of the MIP images confirms the above findings IMPRESSION: CT HEAD IMPRESSION: 1. Normal expected interval evolution of scattered posterior circulation infarcts, grossly stable in size and distribution as compared to previous. No evidence for hemorrhagic transformation or other complication. 2. No other new acute intracranial abnormality. CTA HEAD AND NECK IMPRESSION: 1. Stable CTA of the head and neck with persistent occlusive thrombus involving the distal V4 segments as well as the proximal-mid basilar artery, little interval changed in appearance as compared to previous. Distal reconstitution at the distal basilar artery, likely via the anterior circulation via small bilateral posterior communicating arteries. Both PCAs and SCAs remain well perfused proximally. Right PICA not well visualized as before. 2. 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 3. Mild to moderate stenoses about the origins of the vertebral arteries bilaterally, right greater than left. 4. Otherwise wide patency of the major arterial vasculature of the head and neck. No other new or  progressive finding. Electronically Signed   By: Rise Mu M.D.   On: 06/09/2019 03:11   CT ANGIO NECK W OR WO CONTRAST  Result Date: 06/02/2019 CLINICAL DATA:  Stroke suspected. EXAM: CT ANGIOGRAPHY HEAD AND NECK TECHNIQUE: Multidetector CT imaging of the head and neck was performed using the standard protocol during bolus administration of intravenous contrast. Multiplanar CT image reconstructions and MIPs were obtained to evaluate the vascular anatomy. Carotid stenosis measurements (when applicable) are obtained utilizing NASCET criteria, using the distal internal carotid diameter as the denominator. CONTRAST:  OMNIPAQUE IOHEXOL 350 MG/ML SOLN COMPARISON:   Brain MRI 06/02/2019. FINDINGS: CTA NECK FINDINGS Aortic arch: Standard aortic branching. There is an eccentric curvilinear filling defect within the lumen of aortic arch measuring 2.0 x 4.7 cm (series 7, image 344). Findings likely reflect eccentric thrombus. Additionally, there is occlusive thrombus within the proximal to mid innominate artery. There is reconstitution of flow at the level of the distal innominate artery. No significant proximal subclavian stenosis. Right carotid system: CCA and ICA patent within the neck without significant stenosis (50% or greater). Left carotid system: CCA and ICA patent within the neck without significant stenosis (50% or greater). Eccentric hypodensity within the mid left common carotid artery has an appearance most suggestive of soft plaque. Eccentric thrombus cannot be definitively excluded. Vertebral arteries: The vertebral artery is slightly dominant. The vertebral arteries are patent within the neck. Soft plaque at the vertebral artery origins bilaterally with mild ostial stenosis on the left Skeleton: No acute bony abnormality or aggressive osseous lesion. Other neck: No neck mass or cervical lymphadenopathy. Chronic fracture deformity of the T1 spinous process Upper chest: No consolidation within the imaged lung apices. Review of the MIP images confirms the above findings CTA HEAD FINDINGS Anterior circulation: The intracranial internal carotid arteries are patent without significant stenosis. The M1 middle cerebral arteries are patent without significant stenosis. No M2 proximal branch occlusion or high-grade proximal stenosis is identified. The anterior cerebral arteries are patent without significant proximal stenosis. No intracranial aneurysm is identified. Posterior circulation: There is occlusive thrombus within the distal V4 right vertebral artery. Occlusive thrombus is also suspected within the distal V4 left vertebral artery. Occlusive thrombus extends into the  proximal and mid basilar artery. There is reconstitution of flow within the distal basilar artery. Intermittent and irregular opacification of the right PICA is seen and this vessel likely a contains multifocal thrombus. Flow is seen within the proximal left PICA. The AICA vessels are not well delineated. Flow is seen within the superior cerebellar arteries bilaterally. The posterior cerebral arteries are patent proximally without significant stenosis. However, emboli are suspected within the distal posterior cerebral arteries bilaterally given the pattern of acute infarcts demonstrated on brain MRI performed earlier the same day. Posterior communicating arteries are not definitively identified. Venous sinuses: Within limitations of contrast timing, no convincing thrombus. Anatomic variants: As described. Review of the MIP images confirms the above findings These results were called by telephone at the time of interpretation on 06/02/2019 at 7:11 pm to provider Doralee Albino , who verbally acknowledged these results. IMPRESSION: CTA neck: 1. Eccentric endoluminal thrombus within the aortic arch as described. 2. 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 3. The bilateral common and internal carotid arteries are patent within the neck without significant stenosis. Eccentric hypodensity within the mid left common carotid artery has an appearance most suggestive of soft plaque. Eccentric thrombus at this site cannot be definitively excluded. 4. The  vertebral arteries are patent within the neck bilaterally. Soft plaque at both vertebral artery origins with mild ostial stenosis on the left. CTA head: 1. Occlusive thrombus within the distal V4 right vertebral artery with occlusive thrombus extending into the proximal and mid basilar artery. Occlusive thrombus is also suspected within the very distal V4 left vertebral artery. There is reconstitution of flow within the distal basilar artery. 2. Only  intermittent and irregular enhancement of the right PICA is seen and there is suspected multifocal thrombus within this vessel. 3. The bilateral AICAs are poorly delineated. 4. The posterior cerebral arteries are patent proximally without significant stenosis. However, emboli are suspected within the distal PCAs bilaterally given the pattern of acute infarcts demonstrated on same-day brain MRI. 5. No anterior circulation large vessel occlusion or proximal high-grade stenosis is identified. Electronically Signed   By: Kellie Simmering DO   On: 06/02/2019 19:30   MR BRAIN WO CONTRAST  Result Date: 06/04/2019 CLINICAL DATA:  Stroke follow-up EXAM: MRI HEAD WITHOUT CONTRAST TECHNIQUE: Multiplanar, multiecho pulse sequences of the brain and surrounding structures were obtained without intravenous contrast. COMPARISON:  Head CT 06/03/2019 Brain MRI 06/02/2019 FINDINGS: Brain: Numerous right cerebellar embolic infarcts are unchanged. Infarcts of the bilateral pons have slightly increased in size. Multifocal acute ischemia within both occipital lobes, the left thalamus, the left caudate nucleus and the right frontal white matter are unchanged. Multifocal white matter hyperintensity, most commonly due to chronic ischemic microangiopathy. Normal volume of CSF spaces. No chronic microhemorrhage. Normal midline structures. Vascular: Normal flow voids. Skull and upper cervical spine: Normal marrow signal. Sinuses/Orbits: Negative. Other: None. IMPRESSION: 1. Slight increase in size of bilateral acute infarcts of the pons. Unchanged infarcts of the left thalamus, left caudate nucleus, right cerebellum and right frontal white matter. 2. No hemorrhage or mass effect. 3. Findings of chronic ischemic microangiopathy. Electronically Signed   By: Ulyses Jarred M.D.   On: 06/04/2019 00:55   MR BRAIN WO CONTRAST  Result Date: 06/02/2019 CLINICAL DATA:  Speech difficulty. EXAM: MRI HEAD WITHOUT CONTRAST TECHNIQUE: Multiplanar,  multiecho pulse sequences of the brain and surrounding structures were obtained without intravenous contrast. COMPARISON:  Noncontrast head CT 06/02/2019 FINDINGS: Brain: Please note the patient was unable to tolerate the full examination. As a result, postcontrast imaging could not be obtained. The acquired sequences are intermittently motion degraded. There are multiple small scattered foci of cortical and subcortical restricted diffusion within the bilateral cerebral hemispheres consistent with acute infarcts. These are most notably within the bilateral parietooccipital lobes, but also affect the bilateral frontal lobes. Additional small acute infarct within the left corona radiata. Multiple acute lacunar infarcts within the left thalamus. Multiple acute infarcts within the pons. Multiple acute infarcts within the right cerebellum. Corresponding T2/FLAIR hyperintensity at these sites. No significant mass effect. Chronic infarct within the right frontal lobe motor strip and high right parietal lobe. Background mild scattered T2/FLAIR hyperintensity within the cerebral white matter is nonspecific, but consistent with chronic small vessel ischemic disease. Mild generalized parenchymal atrophy. There is no evidence of intracranial mass. No midline shift or extra-axial fluid collection. No chronic intracranial blood products. Vascular: No definite loss of expected proximal arterial flow voids. Skull and upper cervical spine: No focal marrow lesion. Sinuses/Orbits: Visualized orbits demonstrate no acute abnormality. Mild ethmoid and right maxillary sinus mucosal thickening. No significant mastoid effusion. IMPRESSION: The patient was unable to tolerate the full examination. As a result, postcontrast imaging could not be obtained. Numerous small supratentorial and  infratentorial acute infarcts involving the cerebral hemispheres, left thalamus, pons and right cerebellar hemisphere. Given involvement of multiple vascular  territories, findings are suggestive of an embolic process. Chronic right frontoparietal cortical infarcts. Background mild generalized parenchymal atrophy and chronic small vessel ischemic disease. Electronically Signed   By: Jackey Loge DO   On: 06/02/2019 15:24   MR CERVICAL SPINE WO CONTRAST  Result Date: 06/02/2019 CLINICAL DATA:  Spinal stenosis EXAM: MRI CERVICAL SPINE WITHOUT CONTRAST TECHNIQUE: Multiplanar, multisequence MR imaging of the cervical spine was performed. No intravenous contrast was administered. COMPARISON:  None. FINDINGS: Significant motion artifact is present Alignment: Trace degenerative listhesis. Vertebrae: Vertebral body heights appear preserved apart from mild degenerative endplate irregularity. No definite marrow edema. Cord: No definite abnormal signal. Posterior Fossa, vertebral arteries, paraspinal tissues: Small right cerebellar infarct. Refer to MRI brain earlier same day Disc levels: C2-C3: Small central disc protrusion indents the ventral thecal sac. Facet hypertrophy no significant canal or foraminal stenosis C3-C4: Facet hypertrophy. No significant canal or foraminal stenosis. C4-C5: Probable left paracentral disc osteophyte complex. No canal stenosis. Probable left foraminal stenosis. C5-C6: Disc bulge with endplate osteophytic ridging eccentric to the left. Probable mild canal stenosis. Probable bilateral foraminal stenosis. C6-C7: Disc bulge with endplate osteophytic ridging eccentric to the left. No significant canal or foraminal stenosis. C7-T1:  No significant canal or foraminal stenosis. IMPRESSION: Motion degraded study. There is no high-grade canal stenosis. Probable foraminal stenosis, which is difficult to evaluate in the setting of artifact. Electronically Signed   By: Guadlupe Spanish M.D.   On: 06/02/2019 18:15   DG CHEST PORT 1 VIEW  Result Date: 06/08/2019 CLINICAL DATA:  54 year old male with leukocytosis. EXAM: PORTABLE CHEST 1 VIEW COMPARISON:  Chest  radiograph dated 06/05/2019. FINDINGS: There is mild chronic interstitial coarsening and bronchitic changes. Bibasilar streaky densities likely atelectasis/scarring. No focal consolidation, pleural effusion or pneumothorax. The cardiac silhouette is within normal limits. No acute osseous pathology. IMPRESSION: No focal consolidation. Electronically Signed   By: Elgie Collard M.D.   On: 06/08/2019 15:55   DG CHEST PORT 1 VIEW  Result Date: 06/05/2019 CLINICAL DATA:  Leukocytosis. EXAM: PORTABLE CHEST 1 VIEW COMPARISON:  June 02, 2019. FINDINGS: The heart size and mediastinal contours are within normal limits. No pneumothorax or pleural effusion is noted. Mild bibasilar subsegmental atelectasis is noted. The visualized skeletal structures are unremarkable. IMPRESSION: Mild bibasilar subsegmental atelectasis. Electronically Signed   By: Lupita Raider M.D.   On: 06/05/2019 12:37   DG Chest Port 1 View  Result Date: 06/02/2019 CLINICAL DATA:  Recent stroke-like symptoms EXAM: PORTABLE CHEST 1 VIEW COMPARISON:  04/07/2016 FINDINGS: Cardiac shadow is mildly enlarged but accentuated by the portable technique. Gastric catheter is noted within the stomach but could be advanced several cm deeper to allow for the proximal side port to lie within the gastric lumen. Lungs are well aerated bilaterally. Mild bibasilar atelectasis is seen. No acute bony abnormality is noted. IMPRESSION: Gastric catheter as described. Mild bibasilar atelectasis. Electronically Signed   By: Alcide Clever M.D.   On: 06/02/2019 21:34   DG Swallowing Func-Speech Pathology  Result Date: 06/04/2019 Objective Swallowing Evaluation: Type of Study: MBS-Modified Barium Swallow Study  Patient Details Name: ROYCE STEGMAN MRN: [id number] Date of Birth: [birth year] Today's Date: 06/04/2019 Time: SLP Start Time (ACUTE ONLY): 1010 -SLP Stop Time (ACUTE ONLY): 1040 SLP Time Calculation (min) (ACUTE ONLY): 30 min Past Medical History: Past Medical History:  Diagnosis Date . Tobacco abuse  Past  Surgical History: No past surgical history on file. HPI: This is a 54 year old gentleman with sudden onset of dizziness followed by dysarthria and right-sided weakness.  MRI confirms both supratentorial and infratentorial bilateral scattered infarcts.  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 MRI brain-numerous small supratentorial and infratentorial acute infarcts involving the cerebral hemispheres, left thalamus, pons and right cerebellar hemisphere.  No data recorded Assessment / Plan / Recommendation CHL IP CLINICAL IMPRESSIONS 06/04/2019 Clinical Impression Pt presents with a primary moderate to severe oral dysphagia due to poor lingual fine motor coordination. Primary problem with the ability to both acheive posterior oral seal to prevent spillage to pharynx while also propelling bolus with appropriate propulsion as well as the inability to use to the tongue to manipulate a solid bolus for mastication. USe of nectar thick liquids improves time and cohesion of the bolus to reduce premature spillage and avoid delayed swallow intiation. With thins however there is posterior spillage of the bolus during propulsion efforts with sensed aspiration as laryngeal elevation initiates. Pt showed promise with a chin tuck maneuver (head turns ineffective), but due to intermittent crying behavior (PBA) pt could not sustain attention for thorough trials. Will attempt a chin tuck with thin at the bedside. For now, pt to initaite nectar thick liquids and pureed solids, preferably with consistent use of a straw. SLP Visit Diagnosis Dysphagia, oral phase (R13.11);Dysphagia, oropharyngeal phase (R13.12) Attention and concentration deficit following -- Frontal lobe and executive function deficit following -- Impact on safety and function Moderate aspiration risk   CHL IP TREATMENT RECOMMENDATION  06/04/2019 Treatment Recommendations Therapy as outlined in treatment plan below   No flowsheet data found. CHL IP DIET RECOMMENDATION 06/04/2019 SLP Diet Recommendations Dysphagia 1 (Puree) solids;Nectar thick liquid Liquid Administration via Straw Medication Administration Crushed with puree Compensations -- Postural Changes Remain semi-upright after after feeds/meals (Comment);Seated upright at 90 degrees   CHL IP OTHER RECOMMENDATIONS 06/04/2019 Recommended Consults -- Oral Care Recommendations Oral care BID Other Recommendations --   CHL IP FOLLOW UP RECOMMENDATIONS 06/04/2019 Follow up Recommendations Inpatient Rehab   CHL IP FREQUENCY AND DURATION 06/04/2019 Speech Therapy Frequency (ACUTE ONLY) min 2x/week Treatment Duration 2 weeks      CHL IP ORAL PHASE 06/04/2019 Oral Phase Impaired Oral - Pudding Teaspoon -- Oral - Pudding Cup -- Oral - Honey Teaspoon -- Oral - Honey Cup -- Oral - Nectar Teaspoon Decreased bolus cohesion Oral - Nectar Cup -- Oral - Nectar Straw WFL Oral - Thin Teaspoon -- Oral - Thin Cup -- Oral - Thin Straw Decreased bolus cohesion;Premature spillage;Delayed oral transit Oral - Puree Delayed oral transit;WFL Oral - Mech Soft -- Oral - Regular Weak lingual manipulation;Impaired mastication Oral - Multi-Consistency -- Oral - Pill -- Oral Phase - Comment --  CHL IP PHARYNGEAL PHASE 06/04/2019 Pharyngeal Phase Impaired Pharyngeal- Pudding Teaspoon -- Pharyngeal -- Pharyngeal- Pudding Cup -- Pharyngeal -- Pharyngeal- Honey Teaspoon -- Pharyngeal -- Pharyngeal- Honey Cup -- Pharyngeal -- Pharyngeal- Nectar Teaspoon Lateral channel residue Pharyngeal -- Pharyngeal- Nectar Cup -- Pharyngeal -- Pharyngeal- Nectar Straw Lateral channel residue Pharyngeal -- Pharyngeal- Thin Teaspoon -- Pharyngeal -- Pharyngeal- Thin Cup Lateral channel residue;Penetration/Aspiration during swallow;Delayed swallow initiation-pyriform sinuses Pharyngeal Material enters airway, passes BELOW cords and not ejected out  despite cough attempt by patient Pharyngeal- Thin Straw Lateral channel residue;Penetration/Aspiration before swallow;Delayed swallow initiation-pyriform sinuses Pharyngeal Material enters airway, passes BELOW cords and not  ejected out despite cough attempt by patient Pharyngeal- Puree Lateral channel residue Pharyngeal -- Pharyngeal- Mechanical Soft -- Pharyngeal -- Pharyngeal- Regular Lateral channel residue Pharyngeal -- Pharyngeal- Multi-consistency -- Pharyngeal -- Pharyngeal- Pill -- Pharyngeal -- Pharyngeal Comment --  No flowsheet data found. Harlon DittyBonnie DeBlois, MA CCC-SLP Acute Rehabilitation Services Pager [phone] Office [phone] Claudine MoutonDeBlois, Bonnie Caroline 06/04/2019, 11:28 AM              EEG adult  Result Date: 06/03/2019 Charlsie QuestYadav, Priyanka O, MD     06/03/2019 11:56 AM Patient Name: Debbra RidingJerry R Tarpley MRN: [id number] Epilepsy Attending: Charlsie QuestPriyanka O Yadav Referring Physician/Provider: Dr. Orpah CobbKiersten Mullis Date: 06/02/2019 Duration: 30.13 minutes Patient history: 54 year old male present with sudden onset of dizziness and dysarthria as well as right-sided weakness.  EEG evaluate for seizures. Level of alertness: Awake AEDs during EEG study: None Technical aspects: This EEG study was done with scalp electrodes positioned according to the 10-20 International system of electrode placement. Electrical activity was acquired at a sampling rate of 500Hz  and reviewed with a high frequency filter of 70Hz  and a low frequency filter of 1Hz . EEG data were recorded continuously and digitally stored. Description: The posterior dominant rhythm consists of 8 Hz activity of moderate voltage (25-35 uV) seen predominantly in posterior head regions, symmetric and reactive to eye opening and eye closing. Hyperventilation and photic stimulation were not performed. IMPRESSION: This study is within normal limits. No seizures or epileptiform discharges were seen throughout the recording. Charlsie Questriyanka O Yadav   ECHOCARDIOGRAM  COMPLETE  Result Date: 06/03/2019    ECHOCARDIOGRAM REPORT   Patient Name:   Debbra RidingJERRY R Darley Date of Exam: 06/03/2019 Medical Rec #:  [id number]     Height:       67.0 in Accession #:    [id number]    Weight:       156.7 lb Date of Birth:  03/28/1965      BSA:          1.823 m Patient Age:    53 years      BP:           151/97 mmHg Patient Gender: M             HR:           89 bpm. Exam Location:  Inpatient Procedure: 2D Echo Indications:    Stroke 434.91/ I163.9  History:        Patient has no prior history of Echocardiogram examinations. No                 prior cardiac.  Sonographer:    Leeroy Bockhelsea Turrentine Referring Phys: Cecilio [id number] WILLIAM A HENSEL IMPRESSIONS  1. Left ventricular ejection fraction, by estimation, is 65 to 70%. The left ventricle has normal function. The left ventricle has no regional wall motion abnormalities. There is mild concentric left ventricular hypertrophy. Left ventricular diastolic parameters are consistent with Grade I diastolic dysfunction (impaired relaxation).  2. Right ventricular systolic function is normal. The right ventricular size is normal.  3. The mitral valve is normal in structure. No evidence of mitral valve regurgitation. No evidence of mitral stenosis.  4. The aortic valve is normal in structure. Aortic valve regurgitation is not visualized. No aortic stenosis is present.  5. The inferior vena cava is normal in size with greater than 50% respiratory variability, suggesting right atrial pressure of 3 mmHg. Conclusion(s)/Recommendation(s): No intracardiac source of embolism detected on this transthoracic study. A transesophageal echocardiogram is recommended to exclude  cardiac source of embolism if clinically indicated. FINDINGS  Left Ventricle: Left ventricular ejection fraction, by estimation, is 65 to 70%. The left ventricle has normal function. The left ventricle has no regional wall motion abnormalities. The left ventricular internal cavity size was normal in size. There is   mild concentric left ventricular hypertrophy. Left ventricular diastolic parameters are consistent with Grade I diastolic dysfunction (impaired relaxation). Normal left ventricular filling pressure. Right Ventricle: The right ventricular size is normal. No increase in right ventricular wall thickness. Right ventricular systolic function is normal. Left Atrium: Left atrial size was normal in size. Right Atrium: Right atrial size was normal in size. Pericardium: Trivial pericardial effusion is present. The pericardial effusion is posterior to the left ventricle. There is no evidence of cardiac tamponade. Mitral Valve: The mitral valve is normal in structure. Normal mobility of the mitral valve leaflets. No evidence of mitral valve regurgitation. No evidence of mitral valve stenosis. Tricuspid Valve: The tricuspid valve is normal in structure. Tricuspid valve regurgitation is trivial. No evidence of tricuspid stenosis. Aortic Valve: The aortic valve is normal in structure. Aortic valve regurgitation is not visualized. No aortic stenosis is present. Pulmonic Valve: The pulmonic valve was normal in structure. Pulmonic valve regurgitation is not visualized. No evidence of pulmonic stenosis. Aorta: The aortic root is normal in size and structure. Venous: The inferior vena cava is normal in size with greater than 50% respiratory variability, suggesting right atrial pressure of 3 mmHg. IAS/Shunts: No atrial level shunt detected by color flow Doppler.  LEFT VENTRICLE PLAX 2D LVIDd:         4.20 cm  Diastology LVIDs:         2.90 cm  LV e' lateral:   6.74 cm/s LV PW:         1.10 cm  LV E/e' lateral: 10.0 LV IVS:        1.10 cm  LV e' medial:    6.42 cm/s LVOT diam:     2.00 cm  LV E/e' medial:  10.5 LV SV:         65 LV SV Index:   36 LVOT Area:     3.14 cm  RIGHT VENTRICLE RV S prime:     16.20 cm/s TAPSE (M-mode): 3.0 cm LEFT ATRIUM             Index       RIGHT ATRIUM          Index LA diam:        2.55 cm 1.40 cm/m  RA  Area:     9.92 cm LA Vol (A2C):   15.8 ml 8.67 ml/m  RA Volume:   18.30 ml 10.04 ml/m LA Vol (A4C):   19.0 ml 10.42 ml/m LA Biplane Vol: 18.4 ml 10.09 ml/m  AORTIC VALVE LVOT Vmax:   129.00 cm/s LVOT Vmean:  84.100 cm/s LVOT VTI:    0.207 m  AORTA Ao Root diam: 2.90 cm Ao Asc diam:  3.00 cm MITRAL VALVE MV Area (PHT): 3.50 cm     SHUNTS MV Decel Time: 217 msec     Systemic VTI:  0.21 m MV E velocity: 67.30 cm/s   Systemic Diam: 2.00 cm MV A velocity: 101.00 cm/s MV E/A ratio:  0.67 Tobias Alexander MD Electronically signed by Tobias Alexander MD Signature Date/Time: 06/03/2019/8:22:00 PM    Final    Results/Tests Pending at Time of Discharge: None  Discharge Medications:  Allergies as of 06/12/2019  Reactions   Codeine Other (See Comments)   unknown   Motrin [ibuprofen] Other (See Comments)   unknown   Penicillins Other (See Comments)   unknown      Medication List    STOP taking these medications   gabapentin 600 MG tablet Commonly known as: NEURONTIN Replaced by: gabapentin 250 MG/5ML solution   pantoprazole 40 MG tablet Commonly known as: PROTONIX Replaced by: pantoprazole sodium 40 mg/20 mL Pack   predniSONE 10 MG tablet Commonly known as: DELTASONE     TAKE these medications   atorvastatin 80 MG tablet Commonly known as: LIPITOR Take 1 tablet (80 mg total) by mouth daily. Start taking on: June 13, 2019   buPROPion 75 MG tablet Commonly known as: WELLBUTRIN Take 2 tablets (150 mg total) by mouth 2 (two) times daily.   clopidogrel 75 MG tablet Commonly known as: PLAVIX Take 1 tablet (75 mg total) by mouth daily. Start taking on: June 13, 2019   gabapentin 250 MG/5ML solution Commonly known as: NEURONTIN Take 12 mLs (600 mg total) by mouth 3 (three) times daily. Replaces: gabapentin 600 MG tablet   mirtazapine 15 MG tablet Commonly known as: Remeron Take 1 tablet (15 mg total) by mouth at bedtime.   nicotine 21 mg/24hr patch Commonly known as: NICODERM  CQ - dosed in mg/24 hours Place 1 patch (21 mg total) onto the skin daily. Start taking on: June 13, 2019   pantoprazole sodium 40 mg/20 mL Pack Commonly known as: PROTONIX Take 20 mLs (40 mg total) by mouth daily. Start taking on: June 13, 2019 Replaces: pantoprazole 40 MG tablet   polyethylene glycol 17 g packet Commonly known as: MIRALAX / GLYCOLAX Take 17 g by mouth daily. Start taking on: June 13, 2019   traMADol 50 MG tablet Commonly known as: ULTRAM Take 1 tablet (50 mg total) by mouth every 6 (six) hours as needed for up to 3 days for moderate pain.       Discharge Instructions: Please refer to Patient Instructions section of EMR for full details.  Patient was counseled important signs and symptoms that should prompt return to medical care, changes in medications, dietary instructions, activity restrictions, and follow up appointments.   Follow-Up Appointments: Follow-up Information    Micki Riley, MD. Schedule an appointment as soon as possible for a visit in 4 week(s).   Specialties: Neurology, Radiology Contact information: [address] [place] [address] [phone]           Dana Allan, MD 06/12/2019, 5:27 PM PGY-1, [facility]

## 2019-06-08 NOTE — Progress Notes (Signed)
  Speech Language Pathology Treatment: Dysphagia  Patient Details Name: Adrian [name] MRN: [id number] DOB: December 18, 1965 Today's Date: 06/08/2019 Time: 0511-0211 SLP Time Calculation (min) (ACUTE ONLY): 23 min  Assessment / Plan / Recommendation Clinical Impression  Pt was seen for skilled ST targeting diet tolerance and diagnostic treatment.  Pt was encountered awake/alert with wife and son present at bedside.  Upon SLP arrival, pt was noted to have wet vocal quality, congested coughing, and decreased secretion management.  Suspect that pt may be intermittently aspirating his secretions.  Pt's HOB was elevated as much as he could tolerate and he was seen with trials of nectar-thick liquid and puree via tsp.  RN and family reported that pt has been having poor PO intake in general.  Family also stated that congestion appeared to be new.  Pt had decreased labial opening and closure around the spoon and he bit down on the spoon during PO trials.  AP transport appeared prolonged and multiple swallows were noted per bolus.  A delayed cough was observed following 2/3 puree trials and an immediate cough was noted following 2/4 tsp of nectar-thick liquid.  Cough was noted be weak and pt was unable to mobilize sputum to his oral cavity for suctioning.  Recommend a repeat MBS to further evaluate swallow function.  Additionally recommend that pt be NPO until MBS is completed.  Spoke with pt and family who were in agreement with this plan.    HPI HPI: This is a 54 year old gentleman with sudden onset of dizziness followed by dysarthria and right-sided weakness.  MRI confirms both supratentorial and infratentorial bilateral scattered infarcts.  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 MRI brain-numerous small supratentorial and infratentorial acute infarcts involving the cerebral hemispheres, left thalamus, pons and right  cerebellar hemisphere.       SLP Plan  Continue with current plan of care       Recommendations  Diet recommendations: NPO Medication Administration: Crushed with puree Supervision: Staff to assist with self feeding;Full supervision/cueing for compensatory strategies                Oral Care Recommendations: Oral care QID;Staff/trained caregiver to provide oral care Follow up Recommendations: Inpatient Rehab SLP Visit Diagnosis: Dysphagia, oral phase (R13.11);Dysphagia, oropharyngeal phase (R13.12) Plan: Continue with current plan of care       GO               Villa Herb., M.S., CCC-SLP Acute Rehabilitation Services Office: [phone]  Shanon Rosser [facility] 06/08/2019, 12:44 PM

## 2019-06-08 NOTE — Progress Notes (Signed)
RT NOTES: Chest vest initiated on patient for 10 minutes at 10Hz . Patient tolerating well.

## 2019-06-08 NOTE — Progress Notes (Signed)
Inpatient Rehab Admissions:  Inpatient Rehab Consult received.  I met with patient and his family at the bedside for rehabilitation assessment and to discuss goals and expectations of an inpatient rehab admission.  They are all very hopeful for CIR, and recognize pt will likely need significant physical assist at discharge.  They are prepared to provide this if needed, and have already begun obtaining DME and planning for a ramp.  Will follow for possible admission this week pending bed availability.   Signed: Shann Medal, PT, DPT Admissions Coordinator [phone] 06/08/19  2:09 PM

## 2019-06-08 NOTE — Progress Notes (Addendum)
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 Codeine Other (See Comments)    unknown  . Motrin [Ibuprofen] Other (See Comments)    unknown  . Penicillins Other (See Comments)    unknown    Patient Measurements: Height: 5\' 7"  (170.2 cm) Weight: 71.1 kg (156 lb 12 oz) IBW/kg (Calculated) : 66.1 Heparin Dosing Weight: 71.1 kg   Vital Signs: Temp: 98.7 F (37.1 C) (04/20 0400) Temp Source: Oral (04/20 0400) BP: 110/85 (04/20 0400) Pulse Rate: 96 (04/20 0400)  Labs: Recent Labs    06/06/19 0601 06/06/19 0601 06/07/19 0606 06/08/19 0507  HGB 14.4   < > 14.9 15.3  HCT 42.3  --  44.6 45.7  PLT 225  --  240 250  HEPARINUNFRC 0.35  --  0.29* <0.10*  CREATININE 0.78  --  0.84 0.87   < > = values in this interval not displayed.    Estimated Creatinine Clearance: 90.8 mL/min (by C-G formula based on SCr of 0.87 mg/dL).   Medical History: Past Medical History:  Diagnosis Date  . Tobacco abuse     Assessment: 54 yo male presented on 06/02/2019 with weakness and slurred speech concerning for stroke. 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 Today, patient found to be hypotensive (patient intermittently requiring norepinephrine pressor support) and difficult to arouse. Patient taken to STAT head CT and found to be negative for hemorrhage. Pharmacy consulted to dose heparin for stroke protocol for basilar and vertebral occlusion.  Heparin level this morning was undetectable (<0.1). Per RN heparin found off this AM at shift change with no explanation for why. Heparin restarted at 0712. Will order repeat heparin level at 1330.   Cbc stable  Goal of Therapy:  Heparin level 0.3 - 0.5 units/mL Monitor platelets by  anticoagulation protocol: Yes   Plan:  Continue heparin at 1000 units/hr Heparin level in 6 hours Monitor heparin level, CBC, and S/S of bleeding daily  Follow up length of therapy of heparin per Dr. 06/04/2019 A. Marylee Floras, PharmD, BCPS, FNKF Clinical Pharmacist [place] Please utilize Amion for appropriate phone number to reach the unit pharmacist [facility])  Addendum:  F/u HL is 0.17, Increase to 1200 units/hr and get heparin level in 6 hours   Lyana Asbill [facility], PharmD, BCPS, FNKF Clinical Pharmacist [place] Please utilize Amion for appropriate phone number to reach the unit pharmacist [facility])     06/08/2019 7:41 AM

## 2019-06-08 NOTE — Progress Notes (Signed)
STROKE TEAM PROGRESS NOTE   INTERVAL HISTORY Wife and son at bedside.  Patient still has severe dysarthria to anarthria, quadriparesis. Significant secretion built up, weak cough, not able to get secretion out. Speech recommend NPO with meds in puree. Plan for MBS tomorrow. Will likely need cortrak tomorrow based on MBS study. Will also repeat CTA head and neck in am to evaluate multi-vessel thrombus with occlusion.   Vitals:   06/07/19 2000 06/08/19 0000 06/08/19 0400 06/08/19 0847  BP:  100/83 110/85 (!) 168/91  Pulse: 88 98 96   Resp: 18 18 18    Temp:  98.6 F (37 C) 98.7 F (37.1 C) 97.9 F (36.6 C)  TempSrc:  Oral Oral Oral  SpO2:  98% 96% 97%  Weight:      Height:        CBC:  Recent Labs  Lab 06/02/19 1058 06/03/19 0657 06/07/19 0606 06/08/19 0507  WBC 21.3*   < > 18.4* 23.9*  NEUTROABS 13.8*  --   --   --   HGB 17.0   < > 14.9 15.3  HCT 50.6   < > 44.6 45.7  MCV 90.0   < > 92.0 89.8  PLT 293   < > 240 250   < > = values in this interval not displayed.    Basic Metabolic Panel:  Recent Labs  Lab 06/04/19 0541 06/05/19 0535 06/06/19 0601 06/06/19 0601 06/07/19 0606 06/08/19 0507  NA 140   < > 136   < > 138 139  K 3.8   < > 3.5   < > 3.7 3.6  CL 109   < > 106   < > 107 108  CO2 18*   < > 21*   < > 17* 18*  GLUCOSE 112*   < > 109*   < > 98 111*  BUN 15   < > 13   < > 15 18  CREATININE 0.87   < > 0.78   < > 0.84 0.87  CALCIUM 8.3*   < > 8.3*   < > 8.8* 9.0  MG 2.1  --  2.2  --   --   --    < > = values in this interval not displayed.   Lipid Panel:     Component Value Date/Time   CHOL 221 (H) 06/02/2019 1830   TRIG 414 (H) 06/02/2019 1830   HDL 40 (L) 06/02/2019 1830   CHOLHDL 5.5 06/02/2019 1830   VLDL UNABLE TO CALCULATE IF TRIGLYCERIDE OVER 400 mg/dL 06/04/2019 35/70/1779   LDLCALC UNABLE TO CALCULATE IF TRIGLYCERIDE OVER 400 mg/dL 3903 00/92/3300   7622:  Lab Results  Component Value Date   HGBA1C 5.8 (H) 06/02/2019   Urine Drug Screen:      Component Value Date/Time   LABOPIA NONE DETECTED 06/02/2019 1400   COCAINSCRNUR NONE DETECTED 06/02/2019 1400   LABBENZ NONE DETECTED 06/02/2019 1400   AMPHETMU NONE DETECTED 06/02/2019 1400   THCU NONE DETECTED 06/02/2019 1400   LABBARB NONE DETECTED 06/02/2019 1400    Alcohol Level     Component Value Date/Time   ETH <10 06/02/2019 1058    IMAGING past 24 hours  No results found.  PHYSICAL EXAM  Temp:  [97.9 F (36.6 C)-98.7 F (37.1 C)] 97.9 F (36.6 C) (04/20 0847) Pulse Rate:  [67-103] 96 (04/20 0400) Resp:  [14-18] 18 (04/20 0400) BP: (99-174)/(81-94) 168/91 (04/20 0847) SpO2:  [94 %-99 %] 97 % (04/20 0847)  General -  Well nourished, well developed, in no apparent distress.  Ophthalmologic - fundi not visualized due to noncooperation.  Cardiovascular - Regular rhythm and rate.  Neuro - awake alert, following simple commands, however, severe dysarthria to anarthria with intangible speech. PERRL, EOMI. Seems to have right lower quadrantanopia although not quiet cooperative on visual confrontation test. B/l upper and lower facial weakness, R>L. Weak cough. Tongue protrusion impaired. RUE proximal 0/5 but bicep 3/5, and tricep 1/5 with finger grip 2/5. LUE proximal 0/5, bicep 2/5 and tricep 1/5 and finger grip 2-/5. BLE proximal 2-/5 and distal 3/5. Bilateral babinski positive. DTR 1+. Subjectively sensation symmetrical. Coordination and gait not tested.      ASSESSMENT/PLAN Mr. MONTOYA BRANDEL is a 54 y.o. male with history of back pain and very heavy tobacco smoking, presenting with dizziness, ataxia, right side weakness and severe dysarthria. He was outside of window for IV tPA.   Stroke: multifocal infarcts mostly in posterior circulation but also in bilateral anterior circulation, cardioembolic pattern.  CT multifocal areas of hypoattenuation scattered throughout the bilateral supratentorial brain and right cerebellum.   CTA head and neck aortic arch endoluminal  thrombus, innominate artery occlusion, left ICA thrombus versus soft plaque, right V4 and basal artery occlusive thrombus   CT Head repeat - 06/04/19 - Multifocal subacute infarcts involving the bilateral cerebrum, right cerebellum, left basal ganglia/thalamus and pons are unchanged. No intracranial hemorrhage.  MRI  numerous small supratentorial and infratentorial acute infarcts involving the b/l cerebral hemispheres, left thalamus, pons and right cerebellar hemisphere.  This exam suggestive of embolic process.  CTA head and neck repeat pending  2D Echo EF 65-70%  LDL 121, TG 414  HgbA1c 5.8  UDS - negative  Lovenox for VTE prophylaxis  None prior to admission, now on ASA + Plavix and heparin IV. Further regimen will be based on CTA head and neck repeat in am.    Therapy recommendations:  CIR   Disposition:  Pending  Transferred to floor 4/19  Transition attending responsibilities to [facility] team 4/21  ? Orthostatic hypotension BP dependent neuro decline  Two episodes of neuro decline with positioning change - orthostatic hypotension   Symptoms improved and subsequently resolved after fluid resuscitation   Fluid bolus followed by IV fluid at 100->75  Off Levophed  Off bedrest  Low BP check on right arm due to innominate artery occlusion, recommend to check BP on left arm for accuracy.  Orthostatic vitals OK with lying and sitting  Secretion management  Copious secretions with weak cough  NT suction PRN  3% saline neb Q4h while awake  Chest PT Q4h while awake  CXR pending  Afebrile  Leukocytosis - WBC 21.3-23.5-19.9-19.5->22.0->18.4->23.9 (afebrile) (on prednisone started 4/17 for 4 days, now off)  Hyperlipidemia  Home meds: none  LDL 121 and TG 400, LDL goal < 70  Lipitor 80  Continue statin at discharge  Tobacco abuse  Current IV smoker  Smoking cessation counseling provided  Nicotine patch provided  Pt is willing to  quit  Dysphagia   Did not pass swallow  NPO with meds in puree  Speech following   BMS planned in am  May need cortrak for TF  Other Stroke Risk Factors    Other Active Problems  Anxiety, threatening to leave AMA -on nicotine patch for cravings, on buspirone and Seroquel for anxiety  Severe chronic back pain and neuropathy- continue home gabapentin, steroids tapered off now  Hypokalemia - 3.3 ->3.5->3.7->3.6 monitor  Full Code  Hospital day # 6    Rosalin Hawking, MD PhD Stroke Neurology 06/08/2019 10:55 AM   To contact Stroke Continuity provider, please refer to [email or web address] After hours, contact General Neurology

## 2019-06-08 NOTE — Evaluation (Signed)
Speech Language Pathology Evaluation Patient Details Name: EBERARDO DEMELLO MRN: [id number] DOB: [birth year] Today's Date: 06/08/2019 Time: 3762-8315 SLP Time Calculation (min) (ACUTE ONLY): 21 min  Problem List:  Patient Active Problem List   Diagnosis Date Noted  . Chronic right-sided low back pain with right-sided sciatica   . Tobacco abuse   . Leukocytosis   . Prediabetes   . CVA (cerebral vascular accident) ([place]) 06/02/2019  . 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 Tobacco abuse    Past Surgical History: History reviewed. No pertinent surgical history. HPI:  This is a 54 year old gentleman with sudden onset of dizziness followed by dysarthria and right-sided weakness.  MRI confirms both supratentorial and infratentorial bilateral scattered infarcts.  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 MRI brain-numerous small supratentorial and infratentorial acute infarcts involving the cerebral hemispheres, left thalamus, pons and right cerebellar hemisphere.    Assessment / Plan / Recommendation Clinical Impression  Pt was seen for a cognitive-linguistic evaluation.  History was obtained from the pt's wife and son who were at his bedside during this evaluation.  Wife reported that the pt was fully independent prior to admission and that he worked full time at a [employer].  He lives with his wife and five other family members and he has additional family that lives near by.  Wife reported that she does not work due to a disability and that the pt is her caretaker.  Pt presents with severe dysarthria c/b reduced speech intelligibility at the word level.  Speech was approximately 15% intelligible to an unfamiliar listener.  Pt was educated regarding speech intelligibility strategies and he was able to utilize pausing and  over-articulation to increase speech intelligibility to approximately 25% at the word level.  Cognition and language abilities were difficult to assess secondary to dysarthria, but expressive and receptive language appeared to be functional.  Pt answered yes/no questions and followed 2 step commands with 100% accuracy.  He additionally named items around the room and repeated words/short phrases with 100% accuracy.  Suspect cognitive deficits, particularly with attention and problem solving.  Pt also demonstrated emotional lability throughout this evaluation.  Recommend additional ST 5x weekly (CIR) and assistance with IADLs at time of discharge.  Pt will benefit from additional diagnostic treatment to further evaluate cognitive-linguistic abilities.     SLP Assessment  SLP Recommendation/Assessment: Patient needs continued Speech Lanaguage Pathology Services SLP Visit Diagnosis: Dysarthria and anarthria (R47.1);Cognitive communication deficit (R41.841)    Follow Up Recommendations  Inpatient Rehab    Frequency and Duration min 2x/week  2 weeks      SLP Evaluation Cognition  Overall Cognitive Status: Difficult to assess Arousal/Alertness: Lethargic Orientation Level: Oriented to person Attention: Sustained Sustained Attention: Impaired Sustained Attention Impairment: Functional basic;Verbal basic Behaviors: Lability       Comprehension  Auditory Comprehension Overall Auditory Comprehension: Appears within functional limits for tasks assessed Yes/No Questions: Within Functional Limits Commands: Within Functional Limits Conversation: Simple    Expression Expression Primary Mode of Expression: Verbal Verbal Expression Overall Verbal Expression: Appears within functional limits for tasks assessed Repetition: No impairment Naming: No impairment Responsive: Not tested Confrontation: Impaired Convergent: 75-100% accurate Divergent: Not tested Interfering Components: Speech  intelligibility Written Expression Dominant Hand: Right Written Expression: Not tested   Oral / Motor  Oral Motor/Sensory Function Overall Oral  Motor/Sensory Function: Severe impairment Facial ROM: Reduced right;Reduced left Facial Symmetry: Abnormal symmetry right;Abnormal symmetry left Facial Strength: Reduced right;Reduced left Lingual ROM: Reduced right;Suspected CN XII (hypoglossal) dysfunction Lingual Symmetry: Within Functional Limits Lingual Strength: Reduced Motor Speech Overall Motor Speech: Impaired Respiration: Within functional limits Phonation: Low vocal intensity;Wet Resonance: Hypernasality Articulation: Impaired Level of Impairment: Word Intelligibility: Intelligibility reduced Word: 0-24% accurate Phrase: 0-24% accurate Sentence: 0-24% accurate Conversation: Not tested Motor Planning: Witnin functional limits Effective Techniques: Pause;Over-articulate   GO                   Villa Herb., M.S., CCC-SLP Acute Rehabilitation Services Office: [phone]  Shanon Rosser [facility] 06/08/2019, 1:20 PM

## 2019-06-09 ENCOUNTER — Inpatient Hospital Stay (HOSPITAL_COMMUNITY): Payer: Medicaid Other

## 2019-06-09 DIAGNOSIS — E876 Hypokalemia: Secondary | ICD-10-CM

## 2019-06-09 LAB — BASIC METABOLIC PANEL
Anion gap: 10 (ref 5–15)
BUN: 20 mg/dL (ref 6–20)
CO2: 21 mmol/L — ABNORMAL LOW (ref 22–32)
Calcium: 8.7 mg/dL — ABNORMAL LOW (ref 8.9–10.3)
Chloride: 109 mmol/L (ref 98–111)
Creatinine, Ser: 0.78 mg/dL (ref 0.61–1.24)
GFR calc Af Amer: >60 mL/min (ref >60)
GFR calc non Af Amer: >60 mL/min (ref >60)
Glucose, Bld: 104 mg/dL — ABNORMAL HIGH (ref 70–99)
Potassium: 3.5 mmol/L (ref 3.5–5.1)
Sodium: 140 mmol/L (ref 135–145)

## 2019-06-09 LAB — CBC
HCT: 43.4 % (ref 39.0–52.0)
Hemoglobin: 14.5 g/dL (ref 13.0–17.0)
MCH: 30 pg (ref 26.0–34.0)
MCHC: 33.4 g/dL (ref 30.0–36.0)
MCV: 89.9 fL (ref 80.0–100.0)
Platelets: 233 10*3/uL (ref 150–400)
RBC: 4.83 MIL/uL (ref 4.22–5.81)
RDW: 14.5 % (ref 11.5–15.5)
WBC: 20.1 10*3/uL — ABNORMAL HIGH (ref 4.0–10.5)
nRBC: 0 % (ref 0.0–0.2)

## 2019-06-09 LAB — HEPARIN LEVEL (UNFRACTIONATED): Heparin Unfractionated: 0.51 IU/mL (ref 0.30–0.70)

## 2019-06-09 IMAGING — CT CT ANGIO HEAD
1 of 11 series · 5 of 35 positions shown · IV contrast (OMNI 350)
Comparison: Prior CTA from [DATE].

CLINICAL DATA: Follow-up examination for stroke.



[Series 13: cta neck axial · axial · 0.39mm/px · z∈[-250,-25]mm · 5 of 339 slices shown]
[im 57/339  soft-tissue]
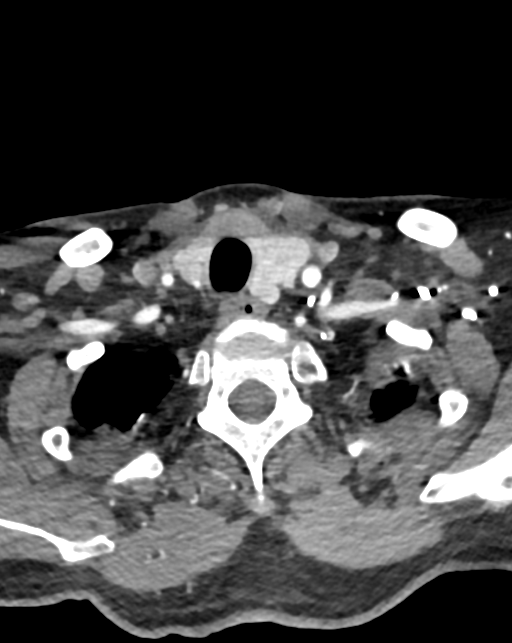
[im 113/339  bone]
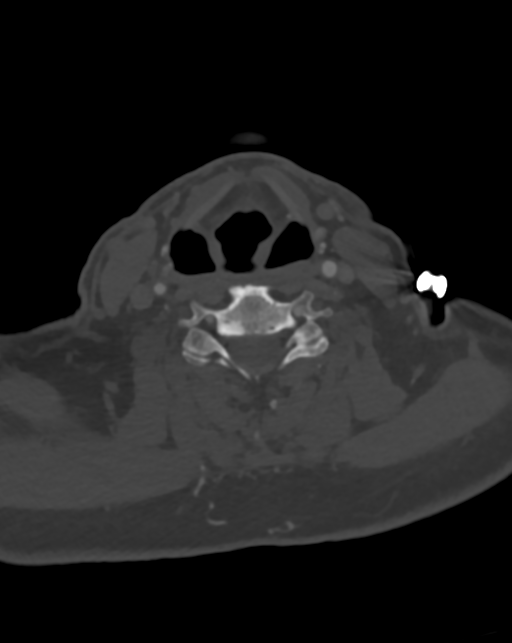
[im 170/339  soft-tissue]
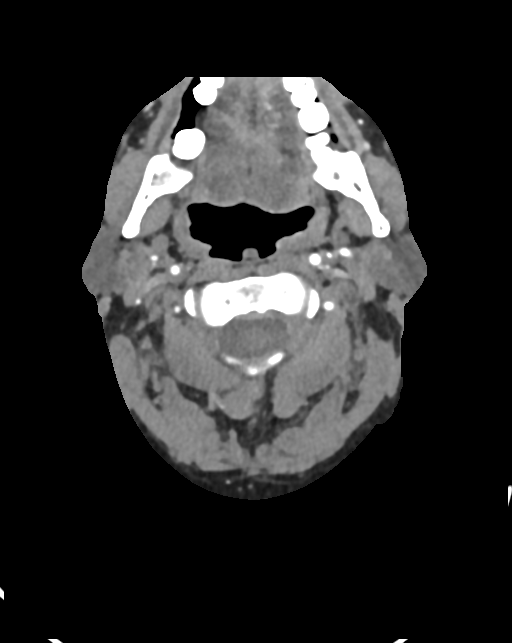
[im 226/339  bone]
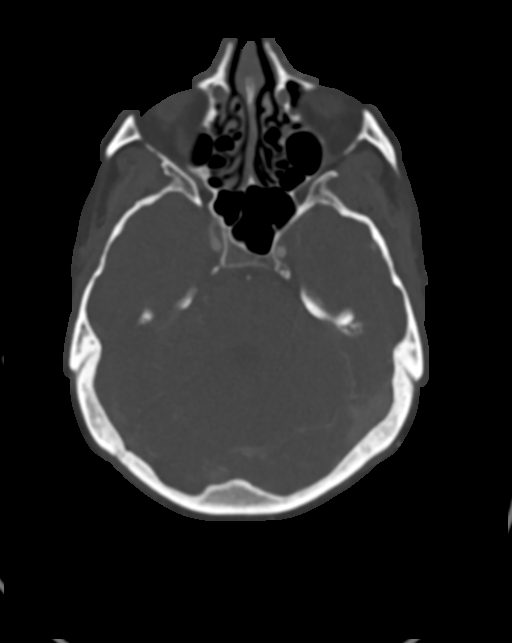
[im 282/339  soft-tissue]
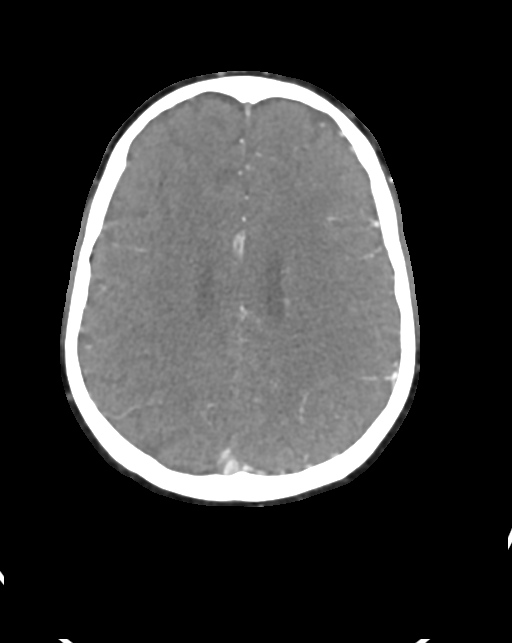

[5 of 35 positions shown; findings below may reference images not displayed]

FINDINGS: CT HEAD FINDINGS

Brain: Normal expected interval evolution of multifocal posterior
circulation infarcts, relatively stable in size and morphology as
compared to previous exams. No evidence for hemorrhagic
transformation or other complication. No significant mass effect.
Additional chronic infarcts involving the right basal ganglia, right
parietal lobe, and right cerebellum noted.

No acute intracranial hemorrhage. No new large vessel territory
infarct. No mass lesion or midline shift. No hydrocephalus or
extra-axial fluid collection.

Vascular: Hyperdensity within the proximal basilar, consistent with
previously identified thrombus.

Skull: Unremarkable.

Sinuses: Paranasal sinuses and mastoid air cells remain largely
clear.

Orbits: Globes orbital soft tissues demonstrate no acute finding.

Review of the MIP images confirms the above findings

CTA NECK FINDINGS

Aortic arch: Aortic arch not included on this exam. Complete
occlusion of the innominate artery again noted. Distal
reconstitution at the origin of the right subclavian and right
common carotid artery. Subclavian arteries otherwise patent with no
new finding.

Right carotid system: Right common and internal carotid arteries
remain patent to the skull base without significant stenosis,
dissection, or occlusion. Mild atheromatous irregularity about the
right bifurcation without significant stenosis.

Left carotid system: Left CCA widely patent proximally. Eccentric
soft plaque within the mid left common carotid artery with
relatively mild stenosis noted, unchanged. Left common and internal
carotid arteries otherwise patent without significant stenosis,
dissection, or occlusion.

Vertebral arteries: Both vertebral arteries arise from the
subclavian arteries. Soft plaque at the origin of the right
vertebral artery with associated mild-to-moderate stenosis.
Attenuated flow seen within the distal right V2 and V3 segments,
which otherwise remain patent to the skull base. Minimal plaque at
the origin of the left vertebral artery with no more than mild
stenosis. Slightly dominant left vertebral artery otherwise widely
patent within the neck.

Skeleton: Mild multilevel cervical spondylosis without significant
stenosis.

Other neck: No other new soft tissue abnormality within the neck.
Subcentimeter hypodense right thyroid nodule noted, of doubtful
significance given size and patient age. No follow-up imaging
recommended regarding this lesion.

Upper chest: Mild dependent atelectatic changes seen within the
visualized lungs. Small amount of layering secretions noted within
the subglottic airway. Visualized upper chest demonstrates no other
acute abnormality.

Review of the MIP images confirms the above findings

CTA HEAD FINDINGS

Anterior circulation: Internal carotid arteries remain widely patent
to the termini without stenosis. A1 segments, anterior communicating
artery common anterior cerebral arteries widely patent. No M1
stenosis. Negative MCA bifurcations. Distal MCA branches remain well
perfused and symmetric.

Posterior circulation: Vertebral arteries patent as they course into
the cranial vault. Occlusive thrombus again seen within the distal
V4 segments bilaterally, right greater than left, little interval
change from previous. Left PICA remains patent. Right PICA not seen.
Occlusive clot extends into the proximal and mid basilar artery,
little interval change from previous. Neither AICA is well seen.
Distal reconstitution at the distal aspect of the basilar artery.
Superior cerebral arteries well perfused bilaterally. Both PCAs
primarily supplied via the basilar, although small bilateral
posterior communicating arteries are seen, left slightly larger than
right. PCAs mildly irregular but remain widely patent and well
perfused to their distal aspects.

Venous sinuses: Grossly patent allowing for timing the contrast
bolus.

Anatomic variants: None significant.

Review of the MIP images confirms the above findings
IMPRESSION: CT HEAD IMPRESSION:

1. Normal expected interval evolution of scattered posterior
circulation infarcts, grossly stable in size and distribution as
compared to previous. No evidence for hemorrhagic transformation or
other complication.
2. No other new acute intracranial abnormality.

CTA HEAD AND NECK IMPRESSION:

1. Stable CTA of the head and neck with persistent occlusive
thrombus involving the distal V4 segments as well as the
proximal-mid basilar artery, little interval changed in appearance
as compared to previous. Distal reconstitution at the distal basilar
artery, likely via the anterior circulation via small bilateral
posterior communicating arteries. Both PCAs and SCAs remain well
perfused proximally. Right PICA not well visualized as before.
2. Occlusion of the innominate artery at its origin, stable.
3. Mild to moderate stenoses about the origins of the vertebral
arteries bilaterally, right greater than left.
4. Otherwise wide patency of the major arterial vasculature of the
head and neck. No other new or progressive finding.

## 2019-06-09 MED ORDER — IOHEXOL 350 MG/ML SOLN
80.0000 mL | Freq: Once | INTRAVENOUS | Status: AC | PRN
  Administered 2019-06-09: 02:00:00 80 mL via INTRAVENOUS

## 2019-06-09 NOTE — Plan of Care (Signed)

## 2019-06-09 NOTE — H&P (Incomplete)
? ? ?  Physical Medicine and Rehabilitation Admission H&P ? ?  ?Chief Complaint  ?Patient presents with  ?? Weakness  ?? Aphasia  ?: ? HPI: Adrian [name] is a 54 year old right-handed male with history of tobacco abuse and chronic low back pain maintained on Neurontin 600 mg 3 times daily as well as prednisone pack.. History taken from chart review wife and son due to patient speech and cognition. Independent prior to admission. Presented 06/02/2019 with dysarthria and quadriparesis. Admission chemistries with WBC 21,000, BUN 24, urine drug screen negative. CT showed multiple bilateral infarcts. Per report numerous small supratentorial and infratentorial acute infarcts involving the cerebral hemispheres, left thalamus, pons and right cerebellar hemisphere. Chronic right frontoparietal cortical infarcts. Follow-up MRI showed slight increase in size of bilateral acute infarcts of the pons. Unchanged infarcts of the left thalamus left caudate nucleus, right cerebellum and right frontal white matter. No hemorrhage or mass-effect.. Patient did not receive TPA. 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 Occlusive thrombus also suspected within the very distal V4 left vertebral artery. Echocardiogram with ejection fraction of 70% grade 1 diastolic dysfunction. EEG negative for seizure.  CTA head and neck repeated 06/09/2019 unchanged multivessel occlusion.  Recommendations are DAPT of aspirin 325 mg and Plavix 75 mg daily.  Patient had initially been maintained on intravenous heparin which is since been discontinued and currently maintained on Lovenox.  Dysphagia #1 honey thick liquid diet monitoring of hydration and still with ongoing secretion management.  Patient very labile as well as anxious and placed on Wellbutrin as well as the addition of Seroquel as needed.  Bouts of orthostasis initially and did require fluid resuscitation.  His L

## 2019-06-09 NOTE — Progress Notes (Addendum)
Physical Therapy Treatment Patient Details Name: Adrian [name] MRN: [id number] DOB: 12/30/1965 Today's Date: 06/09/2019    History of Present Illness 54 y.o. male admitted on 06/02/19 for dysarthria and weakness.  Pt's MRI revealed numerous small supatentorial and infratentorial acute infarcts involving the verebral hemispheres, left thalamus, pons and right cerebellar hemisphere (embolic).  No tPA given due to outside of window.  Pt with significant PMH of tobacco abuse and chronic low back pain. Neuro changes the evening of 06/03/19--pt placed back on bedrest until 06/07/19.      PT Comments    Patient seen for mobility progression. Pt demonstrates slight increase in active/purposeful movement R UE and L LE. This session focused on sitting balance EOB. Continue to recommend CIR for further skilled PT services to decrease risk of falls and caregiver burden.      Follow Up Recommendations  CIR     Equipment Recommendations  Other (comment)(TBD pending progress)    Recommendations for Other Services       Precautions / Restrictions Precautions Precautions: Fall;Other (comment) Precaution Comments: check BP on left arm due to occlusion on R side. Restrictions Weight Bearing Restrictions: No    Mobility  Bed Mobility Overal bed mobility: Needs Assistance Bed Mobility: Rolling;Sidelying to Sit;Sit to Sidelying Rolling: Total assist;+2 for physical assistance Sidelying to sit: +2 for physical assistance;Total assist     Sit to sidelying: Total assist;+2 for physical assistance General bed mobility comments: total A +2 for all bed mobility; minimal movement on R UE and L LE with multimodal cues/assist prior to sitting EOB  Transfers                    Ambulation/Gait                 Stairs             Wheelchair Mobility    Modified Rankin (Stroke Patients Only) Modified Rankin (Stroke Patients Only) Pre-Morbid Rankin Score: No symptoms Modified  Rankin: Severe disability     Balance Overall balance assessment: Needs assistance Sitting-balance support: Feet supported;No upper extremity supported;Bilateral upper extremity supported Sitting balance-Leahy Scale: Zero Sitting balance - Comments: no righting reaction at trunk, able to hold position with bilateral elbows on quads, attempted pushing off of bed in NDT technique on R elbow x2, trace movement noted                                    Cognition Arousal/Alertness: Awake/alert Behavior During Therapy: Anxious(labile at times) Overall Cognitive Status: Difficult to assess Area of Impairment: Problem solving;Following commands;Attention                 Orientation Level: (Able to recognize family and realize he is in hospital but unable to state clearly) Current Attention Level: Sustained Memory: Decreased recall of precautions;Decreased short-term memory Following Commands: Follows one step commands inconsistently;Follows one step commands with increased time Safety/Judgement: Decreased awareness of safety;Decreased awareness of deficits Awareness: Intellectual Problem Solving: Requires verbal cues;Requires tactile cues;Decreased initiation;Difficulty sequencing General Comments: Labile with increased interaction, can respond to yes/no questions        Exercises      General Comments        Pertinent Vitals/Pain Pain Assessment: Faces Faces Pain Scale: Hurts little more Pain Location: back-chronic Pain Descriptors / Indicators: Grimacing;Discomfort Pain Intervention(s): Limited activity within patient's tolerance;Monitored during session;Repositioned  Home Living                      Prior Function            PT Goals (current goals can now be found in the care plan section) Acute Rehab PT Goals Patient Stated Goal: unable to state due to significant dysarthria Progress towards PT goals: Progressing toward goals     Frequency    Min 4X/week      PT Plan Current plan remains appropriate    Co-evaluation PT/OT/SLP Co-Evaluation/Treatment: Yes Reason for Co-Treatment: Complexity of the patient's impairments (multi-system involvement);Necessary to address cognition/behavior during functional activity;For patient/therapist safety;To address functional/ADL transfers PT goals addressed during session: Mobility/safety with mobility;Balance;Strengthening/ROM OT goals addressed during session: ADL's and self-care;Strengthening/ROM      AM-PAC PT "6 Clicks" Mobility   Outcome Measure  Help needed turning from your back to your side while in a flat bed without using bedrails?: Total Help needed moving from lying on your back to sitting on the side of a flat bed without using bedrails?: Total Help needed moving to and from a bed to a chair (including a wheelchair)?: Total Help needed standing up from a chair using your arms (e.g., wheelchair or bedside chair)?: Total Help needed to walk in hospital room?: Total Help needed climbing 3-5 steps with a railing? : Total 6 Click Score: 6    End of Session   Activity Tolerance: Patient tolerated treatment well Patient left: with call bell/phone within reach;with family/visitor present;in bed;with bed alarm set;Other (comment)(bed in chair position) Nurse Communication: Mobility status PT Visit Diagnosis: Muscle weakness (generalized) (M62.81);Difficulty in walking, not elsewhere classified (R26.2);Hemiplegia and hemiparesis Hemiplegia - Right/Left: Right Hemiplegia - dominant/non-dominant: Dominant Hemiplegia - caused by: Cerebral infarction     Time: 5643-3295 PT Time Calculation (min) (ACUTE ONLY): 39 min  Charges:  $Therapeutic Activity: 8-22 mins $Neuromuscular Re-education: 8-22 mins                     Erline Levine, PTA Acute Rehabilitation Services Pager: [phone] Office: [phone]     Carolynne Edouard 06/09/2019, 2:50  PM

## 2019-06-09 NOTE — Progress Notes (Addendum)
ANTICOAGULATION CONSULT NOTE - Follow Up Consult  Pharmacy Consult for Heparin Indication: stroke - basilar and vertebral artery occlusion   Assessment: 54 yo male presented on 06/02/2019 with weakness and slurred speech concerning for stroke. 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  Pharmacy consulted to dose heparin for stroke protocol for basilar and vertebral occlusion.  6-hour heparin level was 0.51 units/ml, slightly above goal of 0.3-0.5 units/mL on 1150 units/hour. Confirmed with nurse this morning that heparin level was drawn appropriately in the right arm and that heparin was infusing in left arm.   Patient is 7 days past stroke with no s/sx of bleeding. H&H has been stable, today at 14.5/43.4. Additionally, patient has increased risk of clotting, as CT head shows multifocal subacute infarcts involving the bilateral cerebrum, right cerebellum, left basal ganglia/thalamus and pons. CTA this afternoon shows no evidence for hemorrhagic transformation and no other acute intracranial abnormalities.   Goal of Therapy:  Heparin level 0.3 - 0.5 units/mL Monitor platelets by anticoagulation protocol: Yes   Plan:  Continue heparin at 1150 units/hr Follow up with swallow evaluation, duration of therapy on heparin, and plan to transition to apixaban. Monitor for s/sx of bleeding.   Thanks for allowing pharmacy to be a part of this patient's care.  Meribeth Mattes, PharmD Candidate [employer] [facility] [employer] [facility] [employer]

## 2019-06-09 NOTE — Progress Notes (Signed)
Occupational Therapy Treatment Patient Details Name: Adrian [name] MRN: [id number] DOB: 1965-11-10 Today's Date: 06/09/2019    History of present illness 54 y.o. male admitted on 06/02/19 for dysarthria and weakness.  Pt's MRI revealed numerous small supatentorial and infratentorial acute infarcts involving the verebral hemispheres, left thalamus, pons and right cerebellar hemisphere (embolic).  No tPA given due to outside of window.  Pt with significant PMH of tobacco abuse and chronic low back pain. Neuro changes the evening of 06/03/19--pt placed back on bedrest until 06/07/19.     OT comments  Patient continues to make steady progress towards goals in skilled OT session. Patient's session encompassed co-treat with PT to progress with further NDT techniques and functional mobility. Pt with increased movement in RUE, however fatigues quickly. Pt continues to require max a +1 as pt demonstrates no postural trunk control. Pt able to tolerate stretching at trunk outside base of support to facilitate increased movement and prevent contractures. Pt with labile episodes x2 in session, however able to redirect with family present. Pt remains an excellent candidate for CIR to address deficits seeing as pt has excellent family support to complete home programs and education.   Follow Up Recommendations  CIR;Supervision/Assistance - 24 hour    Equipment Recommendations  Other (comment)(defer to next venue)    Recommendations for Other Services      Precautions / Restrictions Precautions Precautions: Fall;Other (comment) Precaution Comments: check BP on left arm due to occlusion on R side. Restrictions Weight Bearing Restrictions: No       Mobility Bed Mobility Overal bed mobility: Needs Assistance Bed Mobility: Rolling;Sidelying to Sit;Sit to Sidelying Rolling: Total assist;+2 for physical assistance Sidelying to sit: +2 for physical assistance;Total assist     Sit to sidelying: Total  assist;+2 for physical assistance General bed mobility comments: total A +2 for all bed mobility; minimal movement on R UE and LE with multimodal cues/assist prior to sitting EOB trace movement in elevating R shoulder, as well as RUE in sitting, (max effort to provide trace movement) unable to tap toes  Transfers                      Balance Overall balance assessment: Needs assistance Sitting-balance support: Feet supported;No upper extremity supported;Bilateral upper extremity supported Sitting balance-Leahy Scale: Zero Sitting balance - Comments: possess no righting reaction at trunk, able to hold position with bilateral elbows on quads, attempted pushing off of bed in NDT technique on R elbow x2, trace movement noted                                   ADL either performed or assessed with clinical judgement   ADL Overall ADL's : Needs assistance/impaired     Grooming: Wash/dry face;Sitting;Maximal assistance;Total assistance                               Functional mobility during ADLs: Moderate assistance;+2 for physical assistance;+2 for safety/equipment;Maximal assistance General ADL Comments: Pt able to sit EOB with support from therapists, but was total A to complete basic ADLs, minimal movement noted in RUE, but not enough to hold a washcloth to complete hand over hand techniques     Vision       Perception     Praxis      Cognition Arousal/Alertness: Awake/alert Behavior During Therapy: Restless;Anxious(labile  at times) Overall Cognitive Status: Difficult to assess Area of Impairment: Problem solving;Following commands;Attention;Awareness                 Orientation Level: (Able to recognize family and realize he is in hospital but unable to state clearly) Current Attention Level: Sustained Memory: Decreased recall of precautions;Decreased short-term memory Following Commands: Follows one step commands  inconsistently;Follows one step commands with increased time Safety/Judgement: Decreased awareness of safety;Decreased awareness of deficits Awareness: Intellectual Problem Solving: Requires verbal cues;Requires tactile cues;Decreased initiation;Difficulty sequencing General Comments: Labile with increased interaction, can respond to yes/no questions by raising eyeborws but requires increased time to complete        Exercises     Shoulder Instructions       General Comments      Pertinent Vitals/ Pain       Pain Assessment: Faces Faces Pain Scale: Hurts little more Pain Location: back-chronic Pain Descriptors / Indicators: Discomfort Pain Intervention(s): Limited activity within patient's tolerance;Monitored during session;Repositioned  Home Living                                          Prior Functioning/Environment              Frequency  Min 2X/week        Progress Toward Goals  OT Goals(current goals can now be found in the care plan section)  Progress towards OT goals: Progressing toward goals  Acute Rehab OT Goals Patient Stated Goal: unable to state due to significant dysarthria OT Goal Formulation: With patient Time For Goal Achievement: 06/17/19 Potential to Achieve Goals: Good  Plan Discharge plan remains appropriate    Co-evaluation      Reason for Co-Treatment: Complexity of the patient's impairments (multi-system involvement);Necessary to address cognition/behavior during functional activity;For patient/therapist safety;To address functional/ADL transfers   OT goals addressed during session: ADL's and self-care;Strengthening/ROM      AM-PAC OT "6 Clicks" Daily Activity     Outcome Measure   Help from another person eating meals?: Total Help from another person taking care of personal grooming?: A Lot Help from another person toileting, which includes using toliet, bedpan, or urinal?: Total Help from another person  bathing (including washing, rinsing, drying)?: Total Help from another person to put on and taking off regular upper body clothing?: Total Help from another person to put on and taking off regular lower body clothing?: Total 6 Click Score: 7    End of Session    OT Visit Diagnosis: Unsteadiness on feet (R26.81);Other abnormalities of gait and mobility (R26.89);Muscle weakness (generalized) (M62.81);Pain Pain - Right/Left: Left Pain - part of body: Leg   Activity Tolerance Patient tolerated treatment well   Patient Left with call bell/phone within reach;with chair alarm set;in bed   Nurse Communication Mobility status        Time: 5643-3295 OT Time Calculation (min): 38 min  Charges: OT General Charges $OT Visit: 1 Visit OT Treatments $Self Care/Home Management : 8-22 mins  Pollyann Glen E. Lamira Borin, COTA/L Acute Rehabilitation Services [phone]   Cherlyn Cushing 06/09/2019, 12:49 PM

## 2019-06-09 NOTE — Progress Notes (Signed)
Modified Barium Swallow Progress Note  Patient Details  Name: Adrian [name] MRN: [id number] Date of Birth: [birth year]  Today's Date: 06/09/2019  Modified Barium Swallow completed.  Full report located under Chart Review in the Imaging Section.  Brief recommendations include the following:  Clinical Impression  Pt's oropharyngeal swallow appears grossly unchanged since initial MBS on 4/16. He is still primarily impacted by his limited lingual fine motor coordination with incomplete oral clearance and premature spillage as he tries to clear his oral cavity. He could not use a straw today. All liquid consistencies tested pool in the pyriform sinuses before the swallow, although filling the pyriform sinuses more with nectar thick liquids as he has a harder time clearing honey thick liquids from his oral cavity. Purees are better contained at the level of the valleculae or above. Note that pt had a congested sounding cough throughout the entire study but he had only one instance of aspiration, and this was followed by a more explosive cough reaction. This occurred before the swallow on a mixed consistency bolus, when pt was given a nectar thick liquid wash to help with clearing his oral cavity of purees. Given the above as well as his limited positioning at bedside, will adjust diet to Dys 1 and honey thick liquids. He did have better clearance and control of oral residue when liquids were given by spoon as opposed to the straw, so would limit bolus size to a spoon for now.   Swallow Evaluation Recommendations       SLP Diet Recommendations: Dysphagia 1 (Puree) solids;Honey thick liquids   Liquid Administration via: Spoon   Medication Administration: Crushed with puree   Supervision: Staff to assist with self feeding;Full supervision/cueing for compensatory strategies   Compensations: Minimize environmental distractions;Slow rate;Small sips/bites;Other (Comment)(help pt to clear mouth of oral  residue)   Postural Changes: Seated upright at 90 degrees   Oral Care Recommendations: Oral care before and after PO   Other Recommendations: Have oral suction available;Remove water pitcher;Prohibited food (jello, ice cream, thin soups);Order thickener from pharmacy     Mahala Menghini., M.A. CCC-SLP Acute Rehabilitation Services Pager [phone] Office [phone]  06/09/2019,2:45 PM

## 2019-06-09 NOTE — Progress Notes (Signed)
STROKE TEAM PROGRESS NOTE   INTERVAL HISTORY son at bedside. Patient lying in bed, neuro stable, no acute event overnight. Still has copious secretions not able to call followed. On chest PT and nebulization. Still has severe dysarthria and quadriparesis. CTA head and neck repeat showed unchanged multivessel occlusion. Will DC heparin drip. Continue DAPT.  Vitals:   06/09/19 0356 06/09/19 0802 06/09/19 0831 06/09/19 1148  BP: 105/82 97/69  (!) 84/45  Pulse: 86 82  86  Resp: 18 18  20   Temp: 98.4 F (36.9 C) 98.6 F (37 C)  97.7 F (36.5 C)  TempSrc: Oral Oral  Oral  SpO2: 95% 93% 96% 93%  Weight:      Height:        CBC:  Recent Labs  Lab 06/08/19 0507 06/09/19 0427  WBC 23.9* 20.1*  HGB 15.3 14.5  HCT 45.7 43.4  MCV 89.8 89.9  PLT 250 233    Basic Metabolic Panel:  Recent Labs  Lab 06/04/19 0541 06/05/19 0535 06/06/19 0601 06/07/19 0606 06/08/19 0507 06/09/19 0427  NA 140   < > 136   < > 139 140  K 3.8   < > 3.5   < > 3.6 3.5  CL 109   < > 106   < > 108 109  CO2 18*   < > 21*   < > 18* 21*  GLUCOSE 112*   < > 109*   < > 111* 104*  BUN 15   < > 13   < > 18 20  CREATININE 0.87   < > 0.78   < > 0.87 0.78  CALCIUM 8.3*   < > 8.3*   < > 9.0 8.7*  MG 2.1  --  2.2  --   --   --    < > = values in this interval not displayed.   Lipid Panel:     Component Value Date/Time   CHOL 221 (H) 06/02/2019 1830   TRIG 414 (H) 06/02/2019 1830   HDL 40 (L) 06/02/2019 1830   CHOLHDL 5.5 06/02/2019 1830   VLDL UNABLE TO CALCULATE IF TRIGLYCERIDE OVER 400 mg/dL 06/04/2019 30/16/0109   LDLCALC UNABLE TO CALCULATE IF TRIGLYCERIDE OVER 400 mg/dL 3235 57/32/2025   4270:  Lab Results  Component Value Date   HGBA1C 5.8 (H) 06/02/2019   Urine Drug Screen:     Component Value Date/Time   LABOPIA NONE DETECTED 06/02/2019 1400   COCAINSCRNUR NONE DETECTED 06/02/2019 1400   LABBENZ NONE DETECTED 06/02/2019 1400   AMPHETMU NONE DETECTED 06/02/2019 1400   THCU NONE DETECTED  06/02/2019 1400   LABBARB NONE DETECTED 06/02/2019 1400    Alcohol Level     Component Value Date/Time   ETH <10 06/02/2019 1058    IMAGING past 24 hours  CT ANGIO HEAD W OR WO CONTRAST  Result Date: 06/09/2019 CLINICAL DATA:  Follow-up examination for stroke. EXAM: CT ANGIOGRAPHY HEAD AND NECK TECHNIQUE: Multidetector CT imaging of the head and neck was performed using the standard protocol during bolus administration of intravenous contrast. Multiplanar CT image reconstructions and MIPs were obtained to evaluate the vascular anatomy. Carotid stenosis measurements (when applicable) are obtained utilizing NASCET criteria, using the distal internal carotid diameter as the denominator. CONTRAST:  59mL OMNIPAQUE IOHEXOL 350 MG/ML SOLN COMPARISON:  Prior CTA from 06/02/2019. FINDINGS: CT HEAD FINDINGS Brain: Normal expected interval evolution of multifocal posterior circulation infarcts, relatively stable in size and morphology as compared to previous exams. No evidence  for hemorrhagic transformation or other complication. No significant mass effect. Additional chronic infarcts involving the right basal ganglia, right parietal lobe, and right cerebellum noted. No acute intracranial hemorrhage. No new large vessel territory infarct. No mass lesion or midline shift. No hydrocephalus or extra-axial fluid collection. Vascular: Hyperdensity within the proximal basilar, consistent with previously identified thrombus. Skull: Unremarkable. Sinuses: Paranasal sinuses and mastoid air cells remain largely clear. Orbits: Globes orbital soft tissues demonstrate no acute finding. Review of the MIP images confirms the above findings CTA NECK FINDINGS Aortic arch: Aortic arch not included on this exam. 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 Distal reconstitution at the origin of the right subclavian and right common carotid artery. Subclavian arteries otherwise patent with no new finding. Right carotid  system: Right common and internal carotid arteries remain patent to the skull base without significant stenosis, dissection, or occlusion. Mild atheromatous irregularity about the right bifurcation without significant stenosis. Left carotid system: Left CCA widely patent proximally. Eccentric soft plaque within the mid left common carotid artery with relatively mild stenosis noted, unchanged. Left common and internal carotid arteries otherwise patent without significant stenosis, dissection, or occlusion. Vertebral arteries: Both vertebral arteries arise from the subclavian arteries. Soft plaque at the origin of the right vertebral artery with associated mild-to-moderate stenosis. Attenuated flow seen within the distal right V2 and V3 segments, which otherwise remain patent to the skull base. Minimal plaque at the origin of the left vertebral artery with no more than mild stenosis. Slightly dominant left vertebral artery otherwise widely patent within the neck. Skeleton: Mild multilevel cervical spondylosis without significant stenosis. Other neck: No other new soft tissue abnormality within the neck. Subcentimeter hypodense right thyroid nodule noted, of doubtful significance given size and patient age. No follow-up imaging recommended regarding this lesion. Upper chest: Mild dependent atelectatic changes seen within the visualized lungs. Small amount of layering secretions noted within the subglottic airway. Visualized upper chest demonstrates no other acute abnormality. Review of the MIP images confirms the above findings CTA HEAD FINDINGS Anterior circulation: Internal carotid arteries remain widely patent to the termini without stenosis. A1 segments, anterior communicating artery common anterior cerebral arteries widely patent. No M1 stenosis. Negative MCA bifurcations. Distal MCA branches remain well perfused and symmetric. Posterior circulation: Vertebral arteries patent as they course into the cranial vault.  Occlusive thrombus again seen within the distal V4 segments bilaterally, right greater than left, little interval change from previous. Left PICA remains patent. Right PICA not seen. Occlusive clot extends into the proximal and mid basilar artery, little interval change from previous. Neither AICA is well seen. Distal reconstitution at the distal aspect of the basilar artery. Superior cerebral arteries well perfused bilaterally. Both PCAs primarily supplied via the basilar, although small bilateral posterior communicating arteries are seen, left slightly larger than right. PCAs mildly irregular but remain widely patent and well perfused to their distal aspects. Venous sinuses: Grossly patent allowing for timing the contrast bolus. Anatomic variants: None significant. Review of the MIP images confirms the above findings IMPRESSION: CT HEAD IMPRESSION: 1. Normal expected interval evolution of scattered posterior circulation infarcts, grossly stable in size and distribution as compared to previous. No evidence for hemorrhagic transformation or other complication. 2. No other new acute intracranial abnormality. CTA HEAD AND NECK IMPRESSION: 1. Stable CTA of the head and neck with persistent occlusive thrombus involving the distal V4 segments as well as the proximal-mid basilar artery, little interval changed in appearance as compared to previous. Distal  reconstitution at the distal basilar artery, likely via the anterior circulation via small bilateral posterior communicating arteries. Both PCAs and SCAs remain well perfused proximally. Right PICA not well visualized as before. 2. 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 3. Mild to moderate stenoses about the origins of the vertebral arteries bilaterally, right greater than left. 4. Otherwise wide patency of the major arterial vasculature of the head and neck. No other new or progressive finding. Electronically Signed   By: Rise Mu M.D.    On: 06/09/2019 03:11   CT ANGIO NECK W OR WO CONTRAST  Result Date: 06/09/2019 CLINICAL DATA:  Follow-up examination for stroke. EXAM: CT ANGIOGRAPHY HEAD AND NECK TECHNIQUE: Multidetector CT imaging of the head and neck was performed using the standard protocol during bolus administration of intravenous contrast. Multiplanar CT image reconstructions and MIPs were obtained to evaluate the vascular anatomy. Carotid stenosis measurements (when applicable) are obtained utilizing NASCET criteria, using the distal internal carotid diameter as the denominator. CONTRAST:  66mL OMNIPAQUE IOHEXOL 350 MG/ML SOLN COMPARISON:  Prior CTA from 06/02/2019. FINDINGS: CT HEAD FINDINGS Brain: Normal expected interval evolution of multifocal posterior circulation infarcts, relatively stable in size and morphology as compared to previous exams. No evidence for hemorrhagic transformation or other complication. No significant mass effect. Additional chronic infarcts involving the right basal ganglia, right parietal lobe, and right cerebellum noted. No acute intracranial hemorrhage. No new large vessel territory infarct. No mass lesion or midline shift. No hydrocephalus or extra-axial fluid collection. Vascular: Hyperdensity within the proximal basilar, consistent with previously identified thrombus. Skull: Unremarkable. Sinuses: Paranasal sinuses and mastoid air cells remain largely clear. Orbits: Globes orbital soft tissues demonstrate no acute finding. Review of the MIP images confirms the above findings CTA NECK FINDINGS Aortic arch: Aortic arch not included on this exam. 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 Distal reconstitution at the origin of the right subclavian and right common carotid artery. Subclavian arteries otherwise patent with no new finding. Right carotid system: Right common and internal carotid arteries remain patent to the skull base without significant stenosis, dissection, or occlusion. Mild  atheromatous irregularity about the right bifurcation without significant stenosis. Left carotid system: Left CCA widely patent proximally. Eccentric soft plaque within the mid left common carotid artery with relatively mild stenosis noted, unchanged. Left common and internal carotid arteries otherwise patent without significant stenosis, dissection, or occlusion. Vertebral arteries: Both vertebral arteries arise from the subclavian arteries. Soft plaque at the origin of the right vertebral artery with associated mild-to-moderate stenosis. Attenuated flow seen within the distal right V2 and V3 segments, which otherwise remain patent to the skull base. Minimal plaque at the origin of the left vertebral artery with no more than mild stenosis. Slightly dominant left vertebral artery otherwise widely patent within the neck. Skeleton: Mild multilevel cervical spondylosis without significant stenosis. Other neck: No other new soft tissue abnormality within the neck. Subcentimeter hypodense right thyroid nodule noted, of doubtful significance given size and patient age. No follow-up imaging recommended regarding this lesion. Upper chest: Mild dependent atelectatic changes seen within the visualized lungs. Small amount of layering secretions noted within the subglottic airway. Visualized upper chest demonstrates no other acute abnormality. Review of the MIP images confirms the above findings CTA HEAD FINDINGS Anterior circulation: Internal carotid arteries remain widely patent to the termini without stenosis. A1 segments, anterior communicating artery common anterior cerebral arteries widely patent. No M1 stenosis. Negative MCA bifurcations. Distal MCA branches remain well  perfused and symmetric. Posterior circulation: Vertebral arteries patent as they course into the cranial vault. Occlusive thrombus again seen within the distal V4 segments bilaterally, right greater than left, little interval change from previous. Left  PICA remains patent. Right PICA not seen. Occlusive clot extends into the proximal and mid basilar artery, little interval change from previous. Neither AICA is well seen. Distal reconstitution at the distal aspect of the basilar artery. Superior cerebral arteries well perfused bilaterally. Both PCAs primarily supplied via the basilar, although small bilateral posterior communicating arteries are seen, left slightly larger than right. PCAs mildly irregular but remain widely patent and well perfused to their distal aspects. Venous sinuses: Grossly patent allowing for timing the contrast bolus. Anatomic variants: None significant. Review of the MIP images confirms the above findings IMPRESSION: CT HEAD IMPRESSION: 1. Normal expected interval evolution of scattered posterior circulation infarcts, grossly stable in size and distribution as compared to previous. No evidence for hemorrhagic transformation or other complication. 2. No other new acute intracranial abnormality. CTA HEAD AND NECK IMPRESSION: 1. Stable CTA of the head and neck with persistent occlusive thrombus involving the distal V4 segments as well as the proximal-mid basilar artery, little interval changed in appearance as compared to previous. Distal reconstitution at the distal basilar artery, likely via the anterior circulation via small bilateral posterior communicating arteries. Both PCAs and SCAs remain well perfused proximally. Right PICA not well visualized as before. 2. 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 3. Mild to moderate stenoses about the origins of the vertebral arteries bilaterally, right greater than left. 4. Otherwise wide patency of the major arterial vasculature of the head and neck. No other new or progressive finding. Electronically Signed   By: Rise Mu M.D.   On: 06/09/2019 03:11   DG CHEST PORT 1 VIEW  Result Date: 06/08/2019 CLINICAL DATA:  54 year old male with leukocytosis. EXAM: PORTABLE  CHEST 1 VIEW COMPARISON:  Chest radiograph dated 06/05/2019. FINDINGS: There is mild chronic interstitial coarsening and bronchitic changes. Bibasilar streaky densities likely atelectasis/scarring. No focal consolidation, pleural effusion or pneumothorax. The cardiac silhouette is within normal limits. No acute osseous pathology. IMPRESSION: No focal consolidation. Electronically Signed   By: Elgie Collard M.D.   On: 06/08/2019 15:55    PHYSICAL EXAM   Temp:  [97.6 F (36.4 C)-98.6 F (37 C)] 97.7 F (36.5 C) (04/21 1148) Pulse Rate:  [82-92] 86 (04/21 1148) Resp:  [16-20] 20 (04/21 1148) BP: (84-190)/(45-100) 84/45 (04/21 1148) SpO2:  [93 %-98 %] 93 % (04/21 1148)  General - Well nourished, well developed, in no apparent distress.  Ophthalmologic - fundi not visualized due to noncooperation.  Cardiovascular - Regular rhythm and rate.  Neuro - awake alert, following simple commands, however, severe dysarthria to anarthria with intangible speech. PERRL, EOMI. Seems to have right lower quadrantanopia although not quiet cooperative on visual confrontation test. B/l upper and lower facial weakness, R>L. Weak cough. Tongue protrusion impaired. RUE proximal 0/5 but bicep 3/5, and tricep 1/5 with finger grip 2/5. LUE proximal 0/5, bicep 2/5 and tricep 1/5 and finger grip 2-/5. BLE proximal 1/5 and distal 2/5. Bilateral babinski positive. DTR 1+. Subjectively sensation symmetrical. Coordination and gait not tested.      ASSESSMENT/PLAN Mr. DEQUARIUS JEFFRIES is a 54 y.o. male with history of back pain and very heavy tobacco smoking, presenting with dizziness, ataxia, right side weakness and severe dysarthria. He was outside of window for IV tPA.   Stroke: multifocal infarcts  mostly in posterior circulation but also in bilateral anterior circulation, cardioembolic pattern.  CT multifocal areas of hypoattenuation scattered throughout the bilateral supratentorial brain and right cerebellum.   CTA  head and neck aortic arch endoluminal thrombus, innominate artery occlusion, left ICA thrombus versus soft plaque, right V4 and basal artery occlusive thrombus   CT Head repeat - 06/04/19 - Multifocal subacute infarcts involving the bilateral cerebrum, right cerebellum, left basal ganglia/thalamus and pons are unchanged. No intracranial hemorrhage.  MRI  numerous small supratentorial and infratentorial acute infarcts involving the b/l cerebral hemispheres, left thalamus, pons and right cerebellar hemisphere.  This exam suggestive of embolic process.  CTA head and neck repeat persistent thrombus distal V4 and proximal-mid BA. Innominate artery origin occlusion. Mild to moderate R>L VA origin stenoses.     2D Echo EF 65-70%  LDL 121, TG 414  HgbA1c 5.8  UDS - negative  Lovenox for VTE prophylaxis  None prior to admission, now on ASA325 and Plavix 75. Will d/c heparin IV.  Therapy recommendations:  CIR   Disposition:  Pending  BP dependent neuro decline  Two episodes of neuro decline with positioning change - orthostatic hypotension   Symptoms improved and subsequently resolved after fluid resuscitation   Fluid bolus followed by IV fluid at 100->75  Off Levophed  Off bedrest  Low BP check on right arm due to innominate artery occlusion, recommend to check BP on left arm for accuracy.  Orthostatic vitals OK with lying and sitting  Secretion management  Copious secretions with weak cough  NT suction PRN  3% saline neb Q4h while awake  Chest PT Q4h while awake  CXR no focal consolidation  Afebrile  Leukocytosis - WBC 21.3-23.5-19.9-19.5->22.0->18.4->23.9->20.9 (afebrile) (on prednisone started 4/17 for 4 days, now off)  Hyperlipidemia  Home meds: none  LDL 121 and TG 400, LDL goal < 70  Lipitor 80  Continue statin at discharge  Tobacco abuse  Current IV smoker  Smoking cessation counseling provided  Nicotine patch provided  Pt is willing to  quit  Dysphagia   Did not pass swallow  NPO with meds in puree  Speech following   MBSS cleared for D1 honey thick liquid diet  May need cortrak for TF  Other Stroke Risk Factors    Other Active Problems  Anxiety, threatening to leave AMA -on nicotine patch for cravings, on buspirone and Seroquel for anxiety  Severe chronic back pain and neuropathy- continue home gabapentin, steroids tapered off now  Hypokalemia - 3.3 ->3.5->3.7->3.6->3.5 resolved  Full Code  Hospital day # 7    Rosalin Hawking, MD PhD Stroke Neurology 06/09/2019 1:44 PM   To contact Stroke Continuity provider, please refer to [email or web address] After hours, contact General Neurology

## 2019-06-09 NOTE — Progress Notes (Signed)
Family Medicine Teaching Service Daily Progress Note Intern Pager: [phone]  Patient name: Adrian [name] Medical record number: [id number] Date of birth: October 01, 1965 Age: 54 y.o. Gender: male  Primary Care Provider: Suzan Garibaldi, FNP Consultants: Neurology, CCM Code Status: Full  Pt Overview and Major Events to Date:  4/14: Admitted for multifocal stroke and basilar artery occlusion, admitted to ICU for pressure support, started on ASA, Plavix, Statin  4/16: orthostatic hypotension with neurological worsening that improved with IV hydration and vasopressors, started on IV Heparin for triple therapy 4/19: transferred out of ICU 4/20: failed swallow study 4/21: Repeat CTA head and neck, MBS study  Assessment and Plan: Adrian [name] a 54 y.o.malewith past medical history significant for heavy tobacco use(4 packs/day) and chronic back pain, who presented withsudden onset dizziness, vomiting, diffuse generalized weakness and severe dysarthriaand found to have multifocal subacute infarcts (primarily posterior circulation) with basilar artery occlusion.   Multifocal Stroke  Basal Artery Occlusion: Stable.  Continues to wear dysarthria and weakness. -Neuro following, appreciate recommendations -Continue ASA, Plavix -Heparin drip discontinued yesterday -PT/OT following -Speech recs Dysphagia 1 with Honey Thick fluids -Continue Atorvastatin 80 mg daily -Continue Protonix 40 mg daily -Discontinue IV fluids -Monitor BP  HLD: Elevated lipid panel on admission -Continue atorvastatin 80 mg daily  Tobacco Use:  4 packs/day smoker x20 years. -Continue bupropion -Continue NicoDerm patch  Chronic Back Pain: -Continue Tylenol as needed -Continue gabapentin 600 mg 3 times daily -K pad -Tramadol 50 mg every 6 as needed  Anxiety  Agitation: Seroquel 25 mg 3 times daily as needed.  He has not required any as needed meds overnight. -Continue Seroquel  FEN/GI:  -Dysphagia 1,  pured diet with honey thickened fluids PPx:  -Lovenox  Disposition: Medically Stable for CIR pending approval  Subjective:  No acute events overnight.  Follows commands but continues to have severe dysarthria  Objective: Temp:  [97.1 F (36.2 C)-98.6 F (37 C)] 97.1 F (36.2 C) (04/21 1605) Pulse Rate:  [78-92] 79 (04/21 1729) Resp:  [16-20] 20 (04/21 1605) BP: (84-190)/(45-100) 127/79 (04/21 1729) SpO2:  [93 %-98 %] 93 % (04/21 1605) Physical Exam:  General: 54 year old male, in no acute distress Cardiovascular: Regular rate and rhythm, no murmurs or gallops appreciated. Respiratory: Coarse crackles but good air entry.  No increased work of breathing Abdomen: Soft, nontender nondistended.  Bowel sounds present Extremities: 87 severe weakness bilaterally left greater than right Neuro: Alert, obeys commands.  Continues to have severe dysarthria as well as upper and lower extremity weakness.  Laboratory: Recent Labs  Lab 06/07/19 0606 06/08/19 0507 06/09/19 0427  WBC 18.4* 23.9* 20.1*  HGB 14.9 15.3 14.5  HCT 44.6 45.7 43.4  PLT 240 250 233   Recent Labs  Lab 06/03/19 0657 06/04/19 0541 06/07/19 0606 06/08/19 0507 06/09/19 0427  NA 137   < > 138 139 140  K 3.3*   < > 3.7 3.6 3.5  CL 104   < > 107 108 109  CO2 19*   < > 17* 18* 21*  BUN 14   < > 15 18 20   CREATININE 0.86   < > 0.84 0.87 0.78  CALCIUM 8.5*   < > 8.8* 9.0 8.7*  PROT 6.3*  --   --   --   --   BILITOT 1.6*  --   --   --   --   ALKPHOS 80  --   --   --   --   ALT 39  --   --   --   --  AST 20  --   --   --   --   GLUCOSE 107*   < > 98 111* 104*   < > = values in this interval not displayed.     Imaging/Diagnostic Tests: CT ANGIO HEAD W OR WO CONTRAST  Result Date: 06/09/2019 CLINICAL DATA:  Follow-up examination for stroke. EXAM: CT ANGIOGRAPHY HEAD AND NECK TECHNIQUE: Multidetector CT imaging of the head and neck was performed using the standard protocol during bolus administration of  intravenous contrast. Multiplanar CT image reconstructions and MIPs were obtained to evaluate the vascular anatomy. Carotid stenosis measurements (when applicable) are obtained utilizing NASCET criteria, using the distal internal carotid diameter as the denominator. CONTRAST:  83mL OMNIPAQUE IOHEXOL 350 MG/ML SOLN COMPARISON:  Prior CTA from 06/02/2019. FINDINGS: CT HEAD FINDINGS Brain: Normal expected interval evolution of multifocal posterior circulation infarcts, relatively stable in size and morphology as compared to previous exams. No evidence for hemorrhagic transformation or other complication. No significant mass effect. Additional chronic infarcts involving the right basal ganglia, right parietal lobe, and right cerebellum noted. No acute intracranial hemorrhage. No new large vessel territory infarct. No mass lesion or midline shift. No hydrocephalus or extra-axial fluid collection. Vascular: Hyperdensity within the proximal basilar, consistent with previously identified thrombus. Skull: Unremarkable. Sinuses: Paranasal sinuses and mastoid air cells remain largely clear. Orbits: Globes orbital soft tissues demonstrate no acute finding. Review of the MIP images confirms the above findings CTA NECK FINDINGS Aortic arch: Aortic arch not included on this exam. 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 Distal reconstitution at the origin of the right subclavian and right common carotid artery. Subclavian arteries otherwise patent with no new finding. Right carotid system: Right common and internal carotid arteries remain patent to the skull base without significant stenosis, dissection, or occlusion. Mild atheromatous irregularity about the right bifurcation without significant stenosis. Left carotid system: Left CCA widely patent proximally. Eccentric soft plaque within the mid left common carotid artery with relatively mild stenosis noted, unchanged. Left common and internal carotid arteries  otherwise patent without significant stenosis, dissection, or occlusion. Vertebral arteries: Both vertebral arteries arise from the subclavian arteries. Soft plaque at the origin of the right vertebral artery with associated mild-to-moderate stenosis. Attenuated flow seen within the distal right V2 and V3 segments, which otherwise remain patent to the skull base. Minimal plaque at the origin of the left vertebral artery with no more than mild stenosis. Slightly dominant left vertebral artery otherwise widely patent within the neck. Skeleton: Mild multilevel cervical spondylosis without significant stenosis. Other neck: No other new soft tissue abnormality within the neck. Subcentimeter hypodense right thyroid nodule noted, of doubtful significance given size and patient age. No follow-up imaging recommended regarding this lesion. Upper chest: Mild dependent atelectatic changes seen within the visualized lungs. Small amount of layering secretions noted within the subglottic airway. Visualized upper chest demonstrates no other acute abnormality. Review of the MIP images confirms the above findings CTA HEAD FINDINGS Anterior circulation: Internal carotid arteries remain widely patent to the termini without stenosis. A1 segments, anterior communicating artery common anterior cerebral arteries widely patent. No M1 stenosis. Negative MCA bifurcations. Distal MCA branches remain well perfused and symmetric. Posterior circulation: Vertebral arteries patent as they course into the cranial vault. Occlusive thrombus again seen within the distal V4 segments bilaterally, right greater than left, little interval change from previous. Left PICA remains patent. Right PICA not seen. Occlusive clot extends into the proximal and mid basilar artery, little interval  change from previous. Neither AICA is well seen. Distal reconstitution at the distal aspect of the basilar artery. Superior cerebral arteries well perfused bilaterally. Both  PCAs primarily supplied via the basilar, although small bilateral posterior communicating arteries are seen, left slightly larger than right. PCAs mildly irregular but remain widely patent and well perfused to their distal aspects. Venous sinuses: Grossly patent allowing for timing the contrast bolus. Anatomic variants: None significant. Review of the MIP images confirms the above findings IMPRESSION: CT HEAD IMPRESSION: 1. Normal expected interval evolution of scattered posterior circulation infarcts, grossly stable in size and distribution as compared to previous. No evidence for hemorrhagic transformation or other complication. 2. No other new acute intracranial abnormality. CTA HEAD AND NECK IMPRESSION: 1. Stable CTA of the head and neck with persistent occlusive thrombus involving the distal V4 segments as well as the proximal-mid basilar artery, little interval changed in appearance as compared to previous. Distal reconstitution at the distal basilar artery, likely via the anterior circulation via small bilateral posterior communicating arteries. Both PCAs and SCAs remain well perfused proximally. Right PICA not well visualized as before. 2. 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 3. Mild to moderate stenoses about the origins of the vertebral arteries bilaterally, right greater than left. 4. Otherwise wide patency of the major arterial vasculature of the head and neck. No other new or progressive finding. Electronically Signed   By: Rise Mu M.D.   On: 06/09/2019 03:11   CT ANGIO NECK W OR WO CONTRAST  Result Date: 06/09/2019 CLINICAL DATA:  Follow-up examination for stroke. EXAM: CT ANGIOGRAPHY HEAD AND NECK TECHNIQUE: Multidetector CT imaging of the head and neck was performed using the standard protocol during bolus administration of intravenous contrast. Multiplanar CT image reconstructions and MIPs were obtained to evaluate the vascular anatomy. Carotid stenosis  measurements (when applicable) are obtained utilizing NASCET criteria, using the distal internal carotid diameter as the denominator. CONTRAST:  30mL OMNIPAQUE IOHEXOL 350 MG/ML SOLN COMPARISON:  Prior CTA from 06/02/2019. FINDINGS: CT HEAD FINDINGS Brain: Normal expected interval evolution of multifocal posterior circulation infarcts, relatively stable in size and morphology as compared to previous exams. No evidence for hemorrhagic transformation or other complication. No significant mass effect. Additional chronic infarcts involving the right basal ganglia, right parietal lobe, and right cerebellum noted. No acute intracranial hemorrhage. No new large vessel territory infarct. No mass lesion or midline shift. No hydrocephalus or extra-axial fluid collection. Vascular: Hyperdensity within the proximal basilar, consistent with previously identified thrombus. Skull: Unremarkable. Sinuses: Paranasal sinuses and mastoid air cells remain largely clear. Orbits: Globes orbital soft tissues demonstrate no acute finding. Review of the MIP images confirms the above findings CTA NECK FINDINGS Aortic arch: Aortic arch not included on this exam. 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 Distal reconstitution at the origin of the right subclavian and right common carotid artery. Subclavian arteries otherwise patent with no new finding. Right carotid system: Right common and internal carotid arteries remain patent to the skull base without significant stenosis, dissection, or occlusion. Mild atheromatous irregularity about the right bifurcation without significant stenosis. Left carotid system: Left CCA widely patent proximally. Eccentric soft plaque within the mid left common carotid artery with relatively mild stenosis noted, unchanged. Left common and internal carotid arteries otherwise patent without significant stenosis, dissection, or occlusion. Vertebral arteries: Both vertebral arteries arise from the  subclavian arteries. Soft plaque at the origin of the right vertebral artery with associated mild-to-moderate stenosis. Attenuated flow  seen within the distal right V2 and V3 segments, which otherwise remain patent to the skull base. Minimal plaque at the origin of the left vertebral artery with no more than mild stenosis. Slightly dominant left vertebral artery otherwise widely patent within the neck. Skeleton: Mild multilevel cervical spondylosis without significant stenosis. Other neck: No other new soft tissue abnormality within the neck. Subcentimeter hypodense right thyroid nodule noted, of doubtful significance given size and patient age. No follow-up imaging recommended regarding this lesion. Upper chest: Mild dependent atelectatic changes seen within the visualized lungs. Small amount of layering secretions noted within the subglottic airway. Visualized upper chest demonstrates no other acute abnormality. Review of the MIP images confirms the above findings CTA HEAD FINDINGS Anterior circulation: Internal carotid arteries remain widely patent to the termini without stenosis. A1 segments, anterior communicating artery common anterior cerebral arteries widely patent. No M1 stenosis. Negative MCA bifurcations. Distal MCA branches remain well perfused and symmetric. Posterior circulation: Vertebral arteries patent as they course into the cranial vault. Occlusive thrombus again seen within the distal V4 segments bilaterally, right greater than left, little interval change from previous. Left PICA remains patent. Right PICA not seen. Occlusive clot extends into the proximal and mid basilar artery, little interval change from previous. Neither AICA is well seen. Distal reconstitution at the distal aspect of the basilar artery. Superior cerebral arteries well perfused bilaterally. Both PCAs primarily supplied via the basilar, although small bilateral posterior communicating arteries are seen, left slightly larger  than right. PCAs mildly irregular but remain widely patent and well perfused to their distal aspects. Venous sinuses: Grossly patent allowing for timing the contrast bolus. Anatomic variants: None significant. Review of the MIP images confirms the above findings IMPRESSION: CT HEAD IMPRESSION: 1. Normal expected interval evolution of scattered posterior circulation infarcts, grossly stable in size and distribution as compared to previous. No evidence for hemorrhagic transformation or other complication. 2. No other new acute intracranial abnormality. CTA HEAD AND NECK IMPRESSION: 1. Stable CTA of the head and neck with persistent occlusive thrombus involving the distal V4 segments as well as the proximal-mid basilar artery, little interval changed in appearance as compared to previous. Distal reconstitution at the distal basilar artery, likely via the anterior circulation via small bilateral posterior communicating arteries. Both PCAs and SCAs remain well perfused proximally. Right PICA not well visualized as before. 2. 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 3. Mild to moderate stenoses about the origins of the vertebral arteries bilaterally, right greater than left. 4. Otherwise wide patency of the major arterial vasculature of the head and neck. No other new or progressive finding. Electronically Signed   By: Rise Mu M.D.   On: 06/09/2019 03:11    Dana Allan, MD 06/09/2019, 7:07 PM PGY-1, [facility] FPTS Intern pager: [phone], text pages welcome

## 2019-06-09 NOTE — Progress Notes (Signed)
Family Medicine Teaching Service Daily Progress Note Intern Pager: [phone]  Patient name: Adrian [name] Medical record number: [id number] Date of birth: [birth year] Age: 54 y.o. Gender: male  Primary Care Provider: Marin Comment, FNP Consultants: Neurology, CCM Code Status: Full  Pt Overview and Major Events to Date:  4/14: Admitted for multifocal stroke and basilar artery occlusion, admitted to ICU for pressure support, started on ASA, Plavix, Statin  4/16: orthostatic hypotension with neurological worsening that improved with IV hydration and vasopressors, started on IV Heparin for triple therapy 4/19: transferred out of ICU 4/20: failed swallow study 4/21: Repeat CTA head and neck, MBS study  Assessment and Plan: Adrian [name] a 53 y.o.malewith past medical history significant for heavy tobacco use(4 packs/day) and chronic back pain, who presented withsudden onset dizziness, vomiting, diffuse generalized weakness and severe dysarthriaand found to have multifocal subacute infarcts (primarily posterior circulation) with basilar artery occlusion.   Multifocal Stroke  Basal Artery Occlusion: Patient transferred out of ICU today and returns to [facility] service.  Admitted on 04/14 for acute onset stroke outside TPA window.  Neurology following and plan for CTA head and neck for monitoring which remained stable.  Speech evaluating and initially he passed swallow study but continued to have difficulty swallowing. Pain for a repeat modified swallow today.  PT/OT continues to follow and recmmends CIR.  On exam he continues to have severe dysarthria and weakness.   -Neuro following, appreciate recommendations  -Continue ASA, Plavix when p.o., IV heparin per neuro recs -Follow-up MBS -PT/OT/speech following -Continue atorvastatin 80 mg daily -Continue to encourage smoking cessation -Monitor BP from left arm for more accurate readings -Tylenol as needed -IV Protonix while n.p.o. -IV normal  saline at 7mL/h  HLD: Lipid panel notable for Chol 221, Trig 414, HDL 40, and LDL 121. Currently on Atorvastatin 80mg  QD - continue Atorvastatin  Tobacco Use:  History of smoking 4 packs of cigarettes per day for over 20 years.  Extensive smoking cessation education given throughout admission.  Patient started on bupropion as well.  Patient is motivated to quit. -Continue bupropion and p.o. -Continue smoking cessation education - Nicotine patch  Chronic Back Pain: S/p prednisone taper. Currently on Tylenol PRN, gabapentin 600mg  TID, K-pad, and Tramadol 50mg  q6 PRN. - continue current  Management  Anxiety  Agitation: Exhibited moments of increased agitation and threatening to elevate AMA. Patient is not safe to leave AMA at this time.  He was started on Seroquel 25mg  TID PRN.  - continue current management  FEN/GI:  -NPO PPx:  -Heparin gtt  Disposition: CIR pending approval  Subjective:  No acute events overnight.  Continues to remain dysarthric with severe weakness.  Objective: Temp:  [97.6 F (36.4 C)-98.4 F (36.9 C)] 98.4 F (36.9 C) (04/21 0356) Pulse Rate:  [83-95] 86 (04/21 0356) Resp:  [16-20] 18 (04/21 0356) BP: (97-190)/(70-100) 105/82 (04/21 0356) SpO2:  [95 %-98 %] 95 % (04/21 0356) Physical Exam: General: 54 year old male looks older than stated age, in no acute distress Cardiovascular: Regular rate and rhythm, no murmurs or gallops appreciated.  Distal pulses present Respiratory: Coarse crackles throughout lower lobes likely secondary.  No increased work of breathing. Abdomen: Soft, nontender, nondistended.  Bowel sounds present Extremities: severe weakness bilaterally Lt>Rt Neuro: difficult to examine.  Alert and follows some commands.  Severe dysarthria.  Laboratory: Recent Labs  Lab 06/07/19 0606 06/08/19 0507 06/09/19 0427  WBC 18.4* 23.9* 20.1*  HGB 14.9 15.3 14.5  HCT 44.6 45.7 43.4  PLT  240 250 233   Recent Labs  Lab 06/02/19 1058  06/02/19 1058 06/03/19 0657 06/04/19 0541 06/07/19 0606 06/08/19 0507 06/09/19 0427  NA 140   < > 137   < > 138 139 140  K 3.6   < > 3.3*   < > 3.7 3.6 3.5  CL 102   < > 104   < > 107 108 109  CO2 24   < > 19*   < > 17* 18* 21*  BUN 24*   < > 14   < > 15 18 20   CREATININE 1.03   < > 0.86   < > 0.84 0.87 0.78  CALCIUM 9.2   < > 8.5*   < > 8.8* 9.0 8.7*  PROT 7.1  --  6.3*  --   --   --   --   BILITOT 0.9  --  1.6*  --   --   --   --   ALKPHOS 92  --  80  --   --   --   --   ALT 46*  --  39  --   --   --   --   AST 25  --  20  --   --   --   --   GLUCOSE 111*   < > 107*   < > 98 111* 104*   < > = values in this interval not displayed.     Imaging/Diagnostic Tests: CT ANGIO HEAD W OR WO CONTRAST  Result Date: 06/09/2019 CLINICAL DATA:  Follow-up examination for stroke. EXAM: CT ANGIOGRAPHY HEAD AND NECK TECHNIQUE: Multidetector CT imaging of the head and neck was performed using the standard protocol during bolus administration of intravenous contrast. Multiplanar CT image reconstructions and MIPs were obtained to evaluate the vascular anatomy. Carotid stenosis measurements (when applicable) are obtained utilizing NASCET criteria, using the distal internal carotid diameter as the denominator. CONTRAST:  80mL OMNIPAQUE IOHEXOL 350 MG/ML SOLN COMPARISON:  Prior CTA from 06/02/2019. FINDINGS: CT HEAD FINDINGS Brain: Normal expected interval evolution of multifocal posterior circulation infarcts, relatively stable in size and morphology as compared to previous exams. No evidence for hemorrhagic transformation or other complication. No significant mass effect. Additional chronic infarcts involving the right basal ganglia, right parietal lobe, and right cerebellum noted. No acute intracranial hemorrhage. No new large vessel territory infarct. No mass lesion or midline shift. No hydrocephalus or extra-axial fluid collection. Vascular: Hyperdensity within the proximal basilar, consistent with  previously identified thrombus. Skull: Unremarkable. Sinuses: Paranasal sinuses and mastoid air cells remain largely clear. Orbits: Globes orbital soft tissues demonstrate no acute finding. Review of the MIP images confirms the above findings CTA NECK FINDINGS Aortic arch: Aortic arch not included on this exam. 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 Distal reconstitution at the origin of the right subclavian and right common carotid artery. Subclavian arteries otherwise patent with no new finding. Right carotid system: Right common and internal carotid arteries remain patent to the skull base without significant stenosis, dissection, or occlusion. Mild atheromatous irregularity about the right bifurcation without significant stenosis. Left carotid system: Left CCA widely patent proximally. Eccentric soft plaque within the mid left common carotid artery with relatively mild stenosis noted, unchanged. Left common and internal carotid arteries otherwise patent without significant stenosis, dissection, or occlusion. Vertebral arteries: Both vertebral arteries arise from the subclavian arteries. Soft plaque at the origin of the right vertebral artery with associated mild-to-moderate stenosis. Attenuated flow  seen within the distal right V2 and V3 segments, which otherwise remain patent to the skull base. Minimal plaque at the origin of the left vertebral artery with no more than mild stenosis. Slightly dominant left vertebral artery otherwise widely patent within the neck. Skeleton: Mild multilevel cervical spondylosis without significant stenosis. Other neck: No other new soft tissue abnormality within the neck. Subcentimeter hypodense right thyroid nodule noted, of doubtful significance given size and patient age. No follow-up imaging recommended regarding this lesion. Upper chest: Mild dependent atelectatic changes seen within the visualized lungs. Small amount of layering secretions noted within the  subglottic airway. Visualized upper chest demonstrates no other acute abnormality. Review of the MIP images confirms the above findings CTA HEAD FINDINGS Anterior circulation: Internal carotid arteries remain widely patent to the termini without stenosis. A1 segments, anterior communicating artery common anterior cerebral arteries widely patent. No M1 stenosis. Negative MCA bifurcations. Distal MCA branches remain well perfused and symmetric. Posterior circulation: Vertebral arteries patent as they course into the cranial vault. Occlusive thrombus again seen within the distal V4 segments bilaterally, right greater than left, little interval change from previous. Left PICA remains patent. Right PICA not seen. Occlusive clot extends into the proximal and mid basilar artery, little interval change from previous. Neither AICA is well seen. Distal reconstitution at the distal aspect of the basilar artery. Superior cerebral arteries well perfused bilaterally. Both PCAs primarily supplied via the basilar, although small bilateral posterior communicating arteries are seen, left slightly larger than right. PCAs mildly irregular but remain widely patent and well perfused to their distal aspects. Venous sinuses: Grossly patent allowing for timing the contrast bolus. Anatomic variants: None significant. Review of the MIP images confirms the above findings IMPRESSION: CT HEAD IMPRESSION: 1. Normal expected interval evolution of scattered posterior circulation infarcts, grossly stable in size and distribution as compared to previous. No evidence for hemorrhagic transformation or other complication. 2. No other new acute intracranial abnormality. CTA HEAD AND NECK IMPRESSION: 1. Stable CTA of the head and neck with persistent occlusive thrombus involving the distal V4 segments as well as the proximal-mid basilar artery, little interval changed in appearance as compared to previous. Distal reconstitution at the distal basilar  artery, likely via the anterior circulation via small bilateral posterior communicating arteries. Both PCAs and SCAs remain well perfused proximally. Right PICA not well visualized as before. 2. 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 3. Mild to moderate stenoses about the origins of the vertebral arteries bilaterally, right greater than left. 4. Otherwise wide patency of the major arterial vasculature of the head and neck. No other new or progressive finding. Electronically Signed   By: Rise Mu M.D.   On: 06/09/2019 03:11   CT ANGIO NECK W OR WO CONTRAST  Result Date: 06/09/2019 CLINICAL DATA:  Follow-up examination for stroke. EXAM: CT ANGIOGRAPHY HEAD AND NECK TECHNIQUE: Multidetector CT imaging of the head and neck was performed using the standard protocol during bolus administration of intravenous contrast. Multiplanar CT image reconstructions and MIPs were obtained to evaluate the vascular anatomy. Carotid stenosis measurements (when applicable) are obtained utilizing NASCET criteria, using the distal internal carotid diameter as the denominator. CONTRAST:  49mL OMNIPAQUE IOHEXOL 350 MG/ML SOLN COMPARISON:  Prior CTA from 06/02/2019. FINDINGS: CT HEAD FINDINGS Brain: Normal expected interval evolution of multifocal posterior circulation infarcts, relatively stable in size and morphology as compared to previous exams. No evidence for hemorrhagic transformation or other complication. No significant mass effect. Additional chronic  infarcts involving the right basal ganglia, right parietal lobe, and right cerebellum noted. No acute intracranial hemorrhage. No new large vessel territory infarct. No mass lesion or midline shift. No hydrocephalus or extra-axial fluid collection. Vascular: Hyperdensity within the proximal basilar, consistent with previously identified thrombus. Skull: Unremarkable. Sinuses: Paranasal sinuses and mastoid air cells remain largely clear. Orbits: Globes  orbital soft tissues demonstrate no acute finding. Review of the MIP images confirms the above findings CTA NECK FINDINGS Aortic arch: Aortic arch not included on this exam. 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 Distal reconstitution at the origin of the right subclavian and right common carotid artery. Subclavian arteries otherwise patent with no new finding. Right carotid system: Right common and internal carotid arteries remain patent to the skull base without significant stenosis, dissection, or occlusion. Mild atheromatous irregularity about the right bifurcation without significant stenosis. Left carotid system: Left CCA widely patent proximally. Eccentric soft plaque within the mid left common carotid artery with relatively mild stenosis noted, unchanged. Left common and internal carotid arteries otherwise patent without significant stenosis, dissection, or occlusion. Vertebral arteries: Both vertebral arteries arise from the subclavian arteries. Soft plaque at the origin of the right vertebral artery with associated mild-to-moderate stenosis. Attenuated flow seen within the distal right V2 and V3 segments, which otherwise remain patent to the skull base. Minimal plaque at the origin of the left vertebral artery with no more than mild stenosis. Slightly dominant left vertebral artery otherwise widely patent within the neck. Skeleton: Mild multilevel cervical spondylosis without significant stenosis. Other neck: No other new soft tissue abnormality within the neck. Subcentimeter hypodense right thyroid nodule noted, of doubtful significance given size and patient age. No follow-up imaging recommended regarding this lesion. Upper chest: Mild dependent atelectatic changes seen within the visualized lungs. Small amount of layering secretions noted within the subglottic airway. Visualized upper chest demonstrates no other acute abnormality. Review of the MIP images confirms the above findings  CTA HEAD FINDINGS Anterior circulation: Internal carotid arteries remain widely patent to the termini without stenosis. A1 segments, anterior communicating artery common anterior cerebral arteries widely patent. No M1 stenosis. Negative MCA bifurcations. Distal MCA branches remain well perfused and symmetric. Posterior circulation: Vertebral arteries patent as they course into the cranial vault. Occlusive thrombus again seen within the distal V4 segments bilaterally, right greater than left, little interval change from previous. Left PICA remains patent. Right PICA not seen. Occlusive clot extends into the proximal and mid basilar artery, little interval change from previous. Neither AICA is well seen. Distal reconstitution at the distal aspect of the basilar artery. Superior cerebral arteries well perfused bilaterally. Both PCAs primarily supplied via the basilar, although small bilateral posterior communicating arteries are seen, left slightly larger than right. PCAs mildly irregular but remain widely patent and well perfused to their distal aspects. Venous sinuses: Grossly patent allowing for timing the contrast bolus. Anatomic variants: None significant. Review of the MIP images confirms the above findings IMPRESSION: CT HEAD IMPRESSION: 1. Normal expected interval evolution of scattered posterior circulation infarcts, grossly stable in size and distribution as compared to previous. No evidence for hemorrhagic transformation or other complication. 2. No other new acute intracranial abnormality. CTA HEAD AND NECK IMPRESSION: 1. Stable CTA of the head and neck with persistent occlusive thrombus involving the distal V4 segments as well as the proximal-mid basilar artery, little interval changed in appearance as compared to previous. Distal reconstitution at the distal basilar artery, likely via the anterior circulation via  small bilateral posterior communicating arteries. Both PCAs and SCAs remain well perfused  proximally. Right PICA not well visualized as before. 2. 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 3. Mild to moderate stenoses about the origins of the vertebral arteries bilaterally, right greater than left. 4. Otherwise wide patency of the major arterial vasculature of the head and neck. No other new or progressive finding. Electronically Signed   By: Rise Mu M.D.   On: 06/09/2019 03:11   DG CHEST PORT 1 VIEW  Result Date: 06/08/2019 CLINICAL DATA:  54 year old male with leukocytosis. EXAM: PORTABLE CHEST 1 VIEW COMPARISON:  Chest radiograph dated 06/05/2019. FINDINGS: There is mild chronic interstitial coarsening and bronchitic changes. Bibasilar streaky densities likely atelectasis/scarring. No focal consolidation, pleural effusion or pneumothorax. The cardiac silhouette is within normal limits. No acute osseous pathology. IMPRESSION: No focal consolidation. Electronically Signed   By: Elgie Collard M.D.   On: 06/08/2019 15:55    Dana Allan, MD 06/09/2019, 7:46 AM PGY-1, [facility] FPTS Intern pager: [phone], text pages welcome

## 2019-06-10 LAB — CBC WITH DIFFERENTIAL/PLATELET
Abs Immature Granulocytes: 0 10*3/uL (ref 0.00–0.07)
Basophils Absolute: 0 10*3/uL (ref 0.0–0.1)
Basophils Relative: 0 %
Eosinophils Absolute: 0.2 10*3/uL (ref 0.0–0.5)
Eosinophils Relative: 1 %
HCT: 42.9 % (ref 39.0–52.0)
Hemoglobin: 14.3 g/dL (ref 13.0–17.0)
Lymphocytes Relative: 15 %
Lymphs Abs: 2.9 10*3/uL (ref 0.7–4.0)
MCH: 30.5 pg (ref 26.0–34.0)
MCHC: 33.3 g/dL (ref 30.0–36.0)
MCV: 91.5 fL (ref 80.0–100.0)
Monocytes Absolute: 1.4 10*3/uL — ABNORMAL HIGH (ref 0.1–1.0)
Monocytes Relative: 7 %
Neutro Abs: 14.9 10*3/uL — ABNORMAL HIGH (ref 1.7–7.7)
Neutrophils Relative %: 77 %
Platelets: 239 10*3/uL (ref 150–400)
RBC: 4.69 MIL/uL (ref 4.22–5.81)
RDW: 14.7 % (ref 11.5–15.5)
WBC: 19.4 10*3/uL — ABNORMAL HIGH (ref 4.0–10.5)
nRBC: 0 % (ref 0.0–0.2)
nRBC: 0 /100 WBC

## 2019-06-10 LAB — BASIC METABOLIC PANEL
Anion gap: 12 (ref 5–15)
BUN: 17 mg/dL (ref 6–20)
CO2: 19 mmol/L — ABNORMAL LOW (ref 22–32)
Calcium: 8.8 mg/dL — ABNORMAL LOW (ref 8.9–10.3)
Chloride: 108 mmol/L (ref 98–111)
Creatinine, Ser: 0.76 mg/dL (ref 0.61–1.24)
GFR calc Af Amer: >60 mL/min (ref >60)
GFR calc non Af Amer: >60 mL/min (ref >60)
Glucose, Bld: 93 mg/dL (ref 70–99)
Potassium: 3.7 mmol/L (ref 3.5–5.1)
Sodium: 139 mmol/L (ref 135–145)

## 2019-06-10 LAB — PATHOLOGIST SMEAR REVIEW: Path Review: INCREASED

## 2019-06-10 MED ORDER — GABAPENTIN 250 MG/5ML PO SOLN
600.0000 mg | Freq: Three times a day (TID) | ORAL | Status: DC
  Administered 2019-06-10 – 2019-06-11 (×5): 600 mg via ORAL
  Filled 2019-06-10 (×8): qty 12

## 2019-06-10 MED ORDER — PANTOPRAZOLE SODIUM 40 MG PO PACK
40.0000 mg | PACK | Freq: Every day | ORAL | Status: DC
  Administered 2019-06-11 – 2019-06-12 (×2): 40 mg via ORAL
  Filled 2019-06-10 (×2): qty 20

## 2019-06-10 MED ORDER — ENOXAPARIN SODIUM 40 MG/0.4ML ~~LOC~~ SOLN
40.0000 mg | SUBCUTANEOUS | Status: DC
  Administered 2019-06-10 – 2019-06-11 (×2): 40 mg via SUBCUTANEOUS
  Filled 2019-06-10 (×2): qty 0.4

## 2019-06-10 MED ORDER — SODIUM CHLORIDE 0.9 % IV SOLN: INTRAVENOUS | Status: DC

## 2019-06-10 MED ORDER — PANTOPRAZOLE SODIUM 40 MG PO TBEC
40.0000 mg | DELAYED_RELEASE_TABLET | Freq: Every day | ORAL | Status: DC
  Administered 2019-06-10: 10:00:00 40 mg via ORAL
  Filled 2019-06-10: qty 1

## 2019-06-10 NOTE — Progress Notes (Signed)
Pt noted to be agitated this shift. Refusing scheduled Tylenol and demanding to be laid back and refusing to have his mouth inspected to ensure no residual applesauce was there post 1st spoon with med.

## 2019-06-10 NOTE — Progress Notes (Addendum)
STROKE TEAM PROGRESS NOTE   INTERVAL HISTORY Son at bedside. Pt lying in bed, still has copious secretions but weak cough. Now on dys 1 honey thick liquid. Still has significant quadriparesis. Pending CIR. On DAPT.    Vitals:   06/10/19 0841 06/10/19 1100 06/10/19 1126 06/10/19 1200  BP: 105/71   (!) 146/98  Pulse: 88  88 86  Resp: (!) 22  20 20   Temp:    98.7 F (37.1 C)  TempSrc:    Oral  SpO2: 94% 95%  93%  Weight:      Height:        CBC:  Recent Labs  Lab 06/09/19 0427 06/10/19 0546  WBC 20.1* 19.4*  NEUTROABS  --  14.9*  HGB 14.5 14.3  HCT 43.4 42.9  MCV 89.9 91.5  PLT 233 601    Basic Metabolic Panel:  Recent Labs  Lab 06/04/19 0541 06/05/19 0535 06/06/19 0601 06/07/19 0606 06/09/19 0427 06/10/19 0546  NA 140   < > 136   < > 140 139  K 3.8   < > 3.5   < > 3.5 3.7  CL 109   < > 106   < > 109 108  CO2 18*   < > 21*   < > 21* 19*  GLUCOSE 112*   < > 109*   < > 104* 93  BUN 15   < > 13   < > 20 17  CREATININE 0.87   < > 0.78   < > 0.78 0.76  CALCIUM 8.3*   < > 8.3*   < > 8.7* 8.8*  MG 2.1  --  2.2  --   --   --    < > = values in this interval not displayed.   Lipid Panel:     Component Value Date/Time   CHOL 221 (H) 06/02/2019 1830   TRIG 414 (H) 06/02/2019 1830   HDL 40 (L) 06/02/2019 1830   CHOLHDL 5.5 06/02/2019 1830   VLDL UNABLE TO CALCULATE IF TRIGLYCERIDE OVER 400 mg/dL 06/02/2019 1830   LDLCALC UNABLE TO CALCULATE IF TRIGLYCERIDE OVER 400 mg/dL 06/02/2019 1830   HgbA1c:  Lab Results  Component Value Date   HGBA1C 5.8 (H) 06/02/2019   Urine Drug Screen:     Component Value Date/Time   LABOPIA NONE DETECTED 06/02/2019 1400   COCAINSCRNUR NONE DETECTED 06/02/2019 1400   LABBENZ NONE DETECTED 06/02/2019 1400   AMPHETMU NONE DETECTED 06/02/2019 1400   THCU NONE DETECTED 06/02/2019 1400   LABBARB NONE DETECTED 06/02/2019 1400    Alcohol Level     Component Value Date/Time   ETH <10 06/02/2019 1058    IMAGING past 24 hours  No  results found.  PHYSICAL EXAM   Temp:  [97.1 F (36.2 C)-98.7 F (37.1 C)] 98.7 F (37.1 C) (04/22 1200) Pulse Rate:  [78-98] 86 (04/22 1200) Resp:  [19-22] 20 (04/22 1200) BP: (86-173)/(48-98) 146/98 (04/22 1200) SpO2:  [89 %-95 %] 93 % (04/22 1200)  General - Well nourished, well developed, in no apparent distress.  Ophthalmologic - fundi not visualized due to noncooperation.  Cardiovascular - Regular rhythm and rate.  Neuro - awake alert, following simple commands, however, severe dysarthria to anarthria with intangible speech. PERRL, EOMI. Seems to have right lower quadrantanopia although not quiet cooperative on visual confrontation test. B/l upper and lower facial weakness, R>L. Weak cough. Tongue protrusion impaired. RUE proximal 0/5 but bicep 3/5, and tricep 1/5 with finger grip 2/5.  LUE proximal 0/5, bicep 1/5 and tricep 1/5 and finger grip 2-/5. BLE proximal 1/5 and distal 1/5. Bilateral babinski positive. DTR 1+. Subjectively sensation symmetrical. Coordination and gait not tested.      ASSESSMENT/PLAN Adrian [name] is a 54 y.o. male with history of back pain and very heavy tobacco smoking, presenting with dizziness, ataxia, right side weakness and severe dysarthria. He was outside of window for IV tPA.   Stroke: multifocal infarcts mostly in posterior circulation but also in bilateral anterior circulation, cardioembolic pattern.  CT multifocal areas of hypoattenuation scattered throughout the bilateral supratentorial brain and right cerebellum.   CTA head and neck aortic arch endoluminal thrombus, innominate artery occlusion, left ICA thrombus versus soft plaque, right V4 and basal artery occlusive thrombus   CT Head repeat - 06/04/19 - Multifocal subacute infarcts involving the bilateral cerebrum, right cerebellum, left basal ganglia/thalamus and pons are unchanged. No intracranial hemorrhage.  MRI  numerous small supratentorial and infratentorial acute infarcts  involving the b/l cerebral hemispheres, left thalamus, pons and right cerebellar hemisphere.  This exam suggestive of embolic process.  CTA head and neck repeat persistent thrombus distal V4 and proximal-mid BA. Innominate artery origin occlusion. Mild to moderate R>L VA origin stenoses.     2D Echo EF 65-70%  LDL 121, TG 414  HgbA1c 5.8  UDS - negative  Lovenox for VTE prophylaxis  None prior to admission, now on ASA325 and Plavix 75. Continue DAPT for 3 months and then ASA alone.  Therapy recommendations:  CIR   Disposition:  Pending  BP dependent neuro decline  Two episodes of neuro decline with positioning change - orthostatic hypotension   Symptoms improved and subsequently resolved after fluid resuscitation   Fluid bolus followed by IV fluid at 100->75  Off Levophed  Off bedrest  Low BP check on right arm due to innominate artery occlusion, recommend to check BP on left arm for accuracy.  Orthostatic vitals OK with lying and sitting  Secretion management  Copious secretions with weak cough  NT suction PRN  3% saline neb Q4h while awake  Chest PT Q4h while awake  CXR no focal consolidation  Afebrile  Leukocytosis - WBC 21.3-23.5-19.9-19.5->22.0->18.4->23.9->20.1->19.4 (afebrile) (on prednisone started 4/17 for 4 days, now off)  Hyperlipidemia  Home meds: none  LDL 121 and TG 400, LDL goal < 70  Lipitor 80  Continue statin at discharge  Tobacco abuse  Current IV smoker  Smoking cessation counseling provided  Nicotine patch provided  On bupropion  Pt is willing to quit  Dysphagia   Did not pass swallow  NPO with meds in puree  Speech following   MBSS cleared for D1 honey thick liquid diet  However, poor po intake  Will put on NS @ 50cc for IVF  Other Stroke Risk Factors    Other Active Problems  Anxiety/Agitation, threatening to leave AMA - on nicotine patch for cravings, on buspirone and Seroquel for anxiety  Severe  chronic back pain and neuropathy- continue home gabapentin, steroids tapered off now, o K pad and prn tylenol and tramadol   Hypokalemia - 3.3 ->3.5->3.7->3.6->3.5 resolved  Full Code  Hospital day # 8   Neurology will sign off. Please call with questions. Pt will follow up with stroke clinic Dr[facility] Pearlean Brownie at [facility] in about 4 weeks. Thanks for the consult.   Marvel Plan, MD PhD Stroke Neurology 06/10/2019 2:37 PM   To contact Stroke Continuity provider, please refer to [email or web address]. After hours, contact General  Neurology

## 2019-06-10 NOTE — PMR Pre-admission (Signed)
PMR Admission Coordinator Pre-Admission Assessment  Patient: Adrian [name] is an 54 y.o., male MRN: [id number] DOB: 12/11/1965 Height: 5\' 7"  (170.2 cm) Weight: 71.1 kg              Insurance Information HMO:     PPO:      PCP:      IPA:      80/20:      OTHER:  PRIMARY: Uninsured.       Policy#:       Subscriber:  CM Name:       Phone#:      Fax#:  Pre-Cert#:       Employer:  Benefits:  Phone #:      Name:  Eff. Date:      Deduct:       Out of Pocket Max:       Life Max:   CIR:       SNF:  Outpatient:      Co-Pay:  Home Health:       Co-Pay:  DME:      Co-Pay:  Providers:  SECONDARY:       Policy#:       Phone#:   Financial Counselor:       Phone#: [phone]  The "Data Collection Information Summary" for patients in Inpatient Rehabilitation Facilities with attached "Privacy Act Statement-Health Care Records" was provided and verbally reviewed with: N/A  Emergency Contact Information Contact Information    Name Relation Home Work Mobile   Litt,Patricia Spouse   [phone]   Ewin, Rehberg   Bernie Covey     Current Medical History  Patient Admitting Diagnosis: bilateral posterior circulation infarcts, bilateral anterior circulation infarcts   History of Present Illness: Adrian [name] is a 54 year old right-handed male with history of tobacco abuse and chronic low back pain maintained on Neurontin 600 mg 3 times daily as well as prednisone pack.. History taken from chart review wife and son due to patient speech and cognition. Independent prior to admission. Presented 06/02/2019 with dysarthria and quadriparesis. Admission chemistries with WBC 21,000, BUN 24, urine drug screen negative. CT showed multiple bilateral infarcts. Per report numerous small supratentorial and infratentorial acute infarcts involving the cerebral hemispheres, left thalamus, pons and right cerebellar hemisphere. Chronic right frontoparietal cortical infarcts. Follow-up MRI showed slight  increase in size of bilateral acute infarcts of the pons. Unchanged infarcts of the left thalamus left caudate nucleus, right cerebellum and right frontal white matter. No hemorrhage or mass-effect.. Patient did not receive TPA. 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 Occlusive thrombus also suspected within the very distal V4 left vertebral artery. Echocardiogram with ejection fraction of 70% grade 1 diastolic dysfunction. EEG negative for seizure.  CTA head and neck repeated 06/09/2019 unchanged multivessel occlusion.  Recommendations are DAPT of aspirin 325 mg and Plavix 75 mg daily.  Patient had initially been maintained on intravenous heparin which is since been discontinued and currently maintained on Lovenox.  Dysphagia #1 honey thick liquid diet monitoring of hydration and still with ongoing secretion management.  Patient very labile as well as anxious and placed on Wellbutrin as well as the addition of Seroquel as needed.  Bouts of orthostasis initially and did require fluid resuscitation.  His Levophed has been discontinued.  Noted leukocytosis as patient had been on initial prednisone x4 days prior to admission for  chronic back pain since discontinued latest WBC 19,400 and latest chest x-ray no focal consolidation.  Therapy evaluations completed and patient was recommended for a comprehensive rehab program.  Complete NIHSS TOTAL: 21 Glasgow Coma Scale Score: 14  Past Medical History  Past Medical History:  Diagnosis Date  . Tobacco abuse     Family History  family history includes Hypertension in his father and mother.  Prior Rehab/Hospitalizations:  Has the patient had prior rehab or hospitalizations prior to admission? No  Has the patient had major surgery during 100 days prior to admission? No  Current Medications   Current Facility-Administered  Medications:  .  acetaminophen (TYLENOL) tablet 650 mg, 650 mg, Oral, Q6H, Micki Riley, MD, 650 mg at 06/10/19 1224 .  aspirin tablet 325 mg, 325 mg, Oral, Daily, 325 mg at 06/10/19 0941 **OR** aspirin suppository 300 mg, 300 mg, Rectal, Daily, Pearlean Brownie, Pramod S, MD .  atorvastatin (LIPITOR) tablet 80 mg, 80 mg, Oral, Daily, Micki Riley, MD, 80 mg at 06/10/19 0941 .  buPROPion [facility]) tablet 150 mg, 150 mg, Oral, BID, Micki Riley, MD, 150 mg at 06/10/19 0942 .  Chlorhexidine Gluconate Cloth 2 % PADS 6 each, 6 each, Topical, Daily, Caryl Pina, MD, 6 each at 06/10/19 1225 .  clopidogrel (PLAVIX) tablet 75 mg, 75 mg, Oral, Daily, Micki Riley, MD, 75 mg at 06/10/19 0941 .  enoxaparin (LOVENOX) injection 40 mg, 40 mg, Subcutaneous, Q24H, Mullis, Kiersten P, DO, 40 mg at 06/10/19 1224 .  gabapentin (NEURONTIN) 250 MG/5ML solution 600 mg, 600 mg, Oral, TID, Leander Rams, [facility] .  guaiFENesin-dextromethorphan (ROBITUSSIN DM) 100-10 MG/5ML syrup 5 mL, 5 mL, Oral, Q4H PRN, Micki Riley, MD, 5 mL at 06/06/19 0938 .  MEDLINE mouth rinse, 15 mL, Mouth Rinse, BID, Marvel Plan, MD, 15 mL at 06/10/19 1225 .  nicotine (NICODERM CQ - dosed in mg/24 hours) patch 21 mg, 21 mg, Transdermal, Daily, Dana Allan, MD, 21 mg at 06/10/19 0941 .  [START ON 06/11/2019] pantoprazole sodium (PROTONIX) 40 mg/20 mL oral suspension 40 mg, 40 mg, Oral, Daily, Leander Rams, [facility] .  Resource ThickenUp Clear, , Oral, PRN, Marvel Plan, MD .  sodium chloride HYPERTONIC 3 % nebulizer solution 4 mL, 4 mL, Nebulization, Q4H while awake, Marvel Plan, MD, 4 mL at 06/10/19 1531 .  traMADol (ULTRAM) tablet 50 mg, 50 mg, Oral, Q6H PRN, Micki Riley, MD, 50 mg at 06/09/19 0245 .  white petrolatum (VASELINE) gel, , Topical, PRN, Caryl Pina, MD, 0.2 application at 06/03/19 2105  Patients Current Diet:  Diet Order            DIET - DYS 1 Room service appropriate? No; Fluid consistency: Honey Thick; Fluid restriction:  Other (see comments)  Diet effective now              Precautions / Restrictions Precautions Precautions: Fall, Other (comment) Precaution Comments: check BP on left arm due to occlusion on R side. 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  Independent  Indoor Mobility:  Did the patient need assistance with walking from room to room (with or without device)? Independent  Stairs: Did the patient need assistance with internal or external stairs (with or without device)? Independent  Functional Cognition: Did the patient need help planning regular tasks such as shopping or remembering to take medications? Independent  Home Assistive Devices / Equipment    Prior Device Use: Indicate devices/aids used by the patient prior to current illness, exacerbation or injury? None of the above  Current Functional Level Cognition  Arousal/Alertness: Lethargic Overall Cognitive Status: Difficult to assess Difficult to assess due to: Impaired communication Current Attention Level: Sustained Orientation Level: Oriented to person, Oriented to place, Oriented to situation, Disoriented to time Following Commands: Follows one step commands inconsistently, Follows one step commands with increased time Safety/Judgement: Decreased awareness of safety, Decreased awareness of deficits General Comments: Labile with increased interaction, can respond to yes/no questions   Attention: Sustained Sustained Attention: Impaired Sustained Attention Impairment: Functional basic,  Verbal basic Behaviors: Lability    Extremity Assessment (includes Sensation/Coordination)  Upper Extremity Assessment: Generalized weakness  Lower Extremity Assessment: Defer to PT evaluation RLE Deficits / Details: right leg weaker than left leg, but able to take weight.  Some flexion buckling noted in stance and difficulty stepping and progressing leg around when taking pivotal steps to the chair.      ADLs  Overall ADL's : Needs assistance/impaired Eating/Feeding: NPO Grooming: Wash/dry face, Sitting, Maximal assistance, Total assistance Upper Body Bathing: Moderate assistance, Sitting Lower Body Bathing: Maximal assistance, Sit to/from stand, +2 for safety/equipment, +2 for physical assistance Upper Body Dressing : Moderate assistance, Sitting Lower Body Dressing: Maximal assistance, +2 for physical assistance, +2 for safety/equipment, Sit to/from stand Toilet Transfer: +2 for physical assistance, +2 for safety/equipment, Stand-pivot, Moderate assistance Functional mobility during ADLs: Moderate assistance, +2 for physical assistance, +2 for safety/equipment, Maximal assistance General ADL Comments: Pt able to sit EOB with support from therapists, but was total A to complete basic ADLs, minimal movement noted in RUE, but not enough to hold a washcloth to complete hand over hand techniques    Mobility  Overal bed mobility: Needs Assistance Bed Mobility: Rolling, Sidelying to Sit, Sit to Sidelying Rolling: Total assist, +2 for physical assistance Sidelying to sit: +2 for physical assistance, Total assist Supine to sit: +2 for physical assistance, Mod assist Sit to supine: +2 for physical assistance, Total assist Sit to sidelying: Total assist, +2 for physical assistance General bed mobility comments: total A +2 for all bed mobility; minimal movement on R UE and L LE with multimodal cues/assist prior to sitting EOB    Transfers  Overall transfer level: Needs assistance Equipment  used: 2 person hand held assist Transfer via Lift Equipment: [employer] Transfers: Sit to/from Stand, Pharmacologist Sit to Stand: +2 physical assistance, Mod assist Stand pivot transfers: +2 physical assistance, Mod assist General transfer comment: Used maxi sky total lift for OOB to chair for increased upright posture, repositioning.     Ambulation / Gait / Stairs / Wheelchair Mobility  Ambulation/Gait General Gait Details: unable at this time.     Posture / Balance Dynamic Sitting Balance Sitting balance - Comments: no righting reaction at trunk, able to hold position with bilateral elbows on quads, attempted pushing off of bed in NDT technique on R elbow x2, trace movement noted Balance Overall balance assessment: Needs assistance Sitting-balance support: Feet supported, No upper extremity supported, Bilateral upper extremity supported Sitting balance-Leahy Scale: Zero Sitting balance - Comments:  no righting reaction at trunk, able to hold position with bilateral elbows on quads, attempted pushing off of bed in NDT technique on R elbow x2, trace movement noted Standing balance support: Bilateral upper extremity supported Standing balance-Leahy Scale: Poor Standing balance comment: needed mod two person assist to stand for balance and to block R LE.    Special needs/care consideration      Previous Home Environment (from acute therapy documentation) Living Arrangements: Spouse/significant other, Children(son and wife patricia)  Lives With: Family Available Help at Discharge: Family Type of Home: [place]: No Additional Comments: Pt frustrated by dysarthria, did not like answering too many questions, will need more info  Discharge Living Setting Plans for Discharge Living Setting: Patient's home Type of Home at Discharge: House Does the patient have any problems obtaining your medications?: No  Social/Family/Support Systems Patient Roles: Spouse Contact  Information: Jacquelin Hawking Anticipated Caregiver: Mardene Celeste, son Dauntae Anticipated Caregiver's Contact Information: Mardene Celeste [phone] Caregiver Availability: 24/7 Discharge Plan Discussed with Primary Caregiver: Yes Is Caregiver In Agreement with Plan?: Yes Does Caregiver/Family have Issues with Lodging/Transportation while Pt is in Rehab?: No   Goals Patient/Family Goal for Rehab: PT/OT lofty mod/max +1, SLP mod assist Expected length of stay: 32-34 days Additional Information: benefit from neuropsych as soon as able Pt/Family Agrees to Admission and willing to participate: Yes Program Orientation Provided & Reviewed with Pt/Caregiver Including Roles  & Responsibilities: Yes  Barriers to Discharge: Home environment access/layout, Insurance for SNF coverage, Other (comments)(level of care)   Decrease burden of Care through IP rehab admission: Specialzed equipment needs, Diet advancement, Decrease number of caregivers and Patient/family education   Possible need for SNF placement upon discharge: Not anticipated.    Patient Condition: This patient's medical and functional status has changed since the consult dated: 06/04/19 in which the Rehabilitation Physician determined and documented that the patient's condition is appropriate for intensive rehabilitative care in an inpatient rehabilitation facility. See "History of Present Illness" (above) for medical update. Functional changes are: total +2 assist to EOB. Patient's medical and functional status update has been discussed with the Rehabilitation physician and patient remains appropriate for inpatient rehabilitation. Will admit to inpatient rehab Saturday.  Preadmission Screen Completed By:  Michel Santee, PT, DPT 06/10/2019 3:50 PM ______________________________________________________________________   Discussed status with Dr. Letta Pate on 06/10/19 at 3:55 PM  and received approval for admission  today.  Admission Coordinator:  Michel Santee, PT, DPT time 3:55 PM Sudie Grumbling 06/10/19

## 2019-06-10 NOTE — Progress Notes (Signed)
Inpatient Rehab Admissions Coordinator:   I have no beds available for this patient to admit to CIR today.  Will continue to follow for timing of potential admission (hopefully in the next 1-2 days) pending bed availability.   Estill Dooms, PT, DPT Admissions Coordinator [phone] 06/10/19  9:48 AM

## 2019-06-10 NOTE — Plan of Care (Signed)

## 2019-06-11 ENCOUNTER — Inpatient Hospital Stay (HOSPITAL_COMMUNITY): Payer: Medicaid Other

## 2019-06-11 IMAGING — CR DG CHEST 2V
2 series · 2 of 2 positions shown · non-contrast
Comparison: [DATE]

CLINICAL DATA: Congestion with coughing.

EXAM:
CHEST - 2 VIEW

[chest lat]
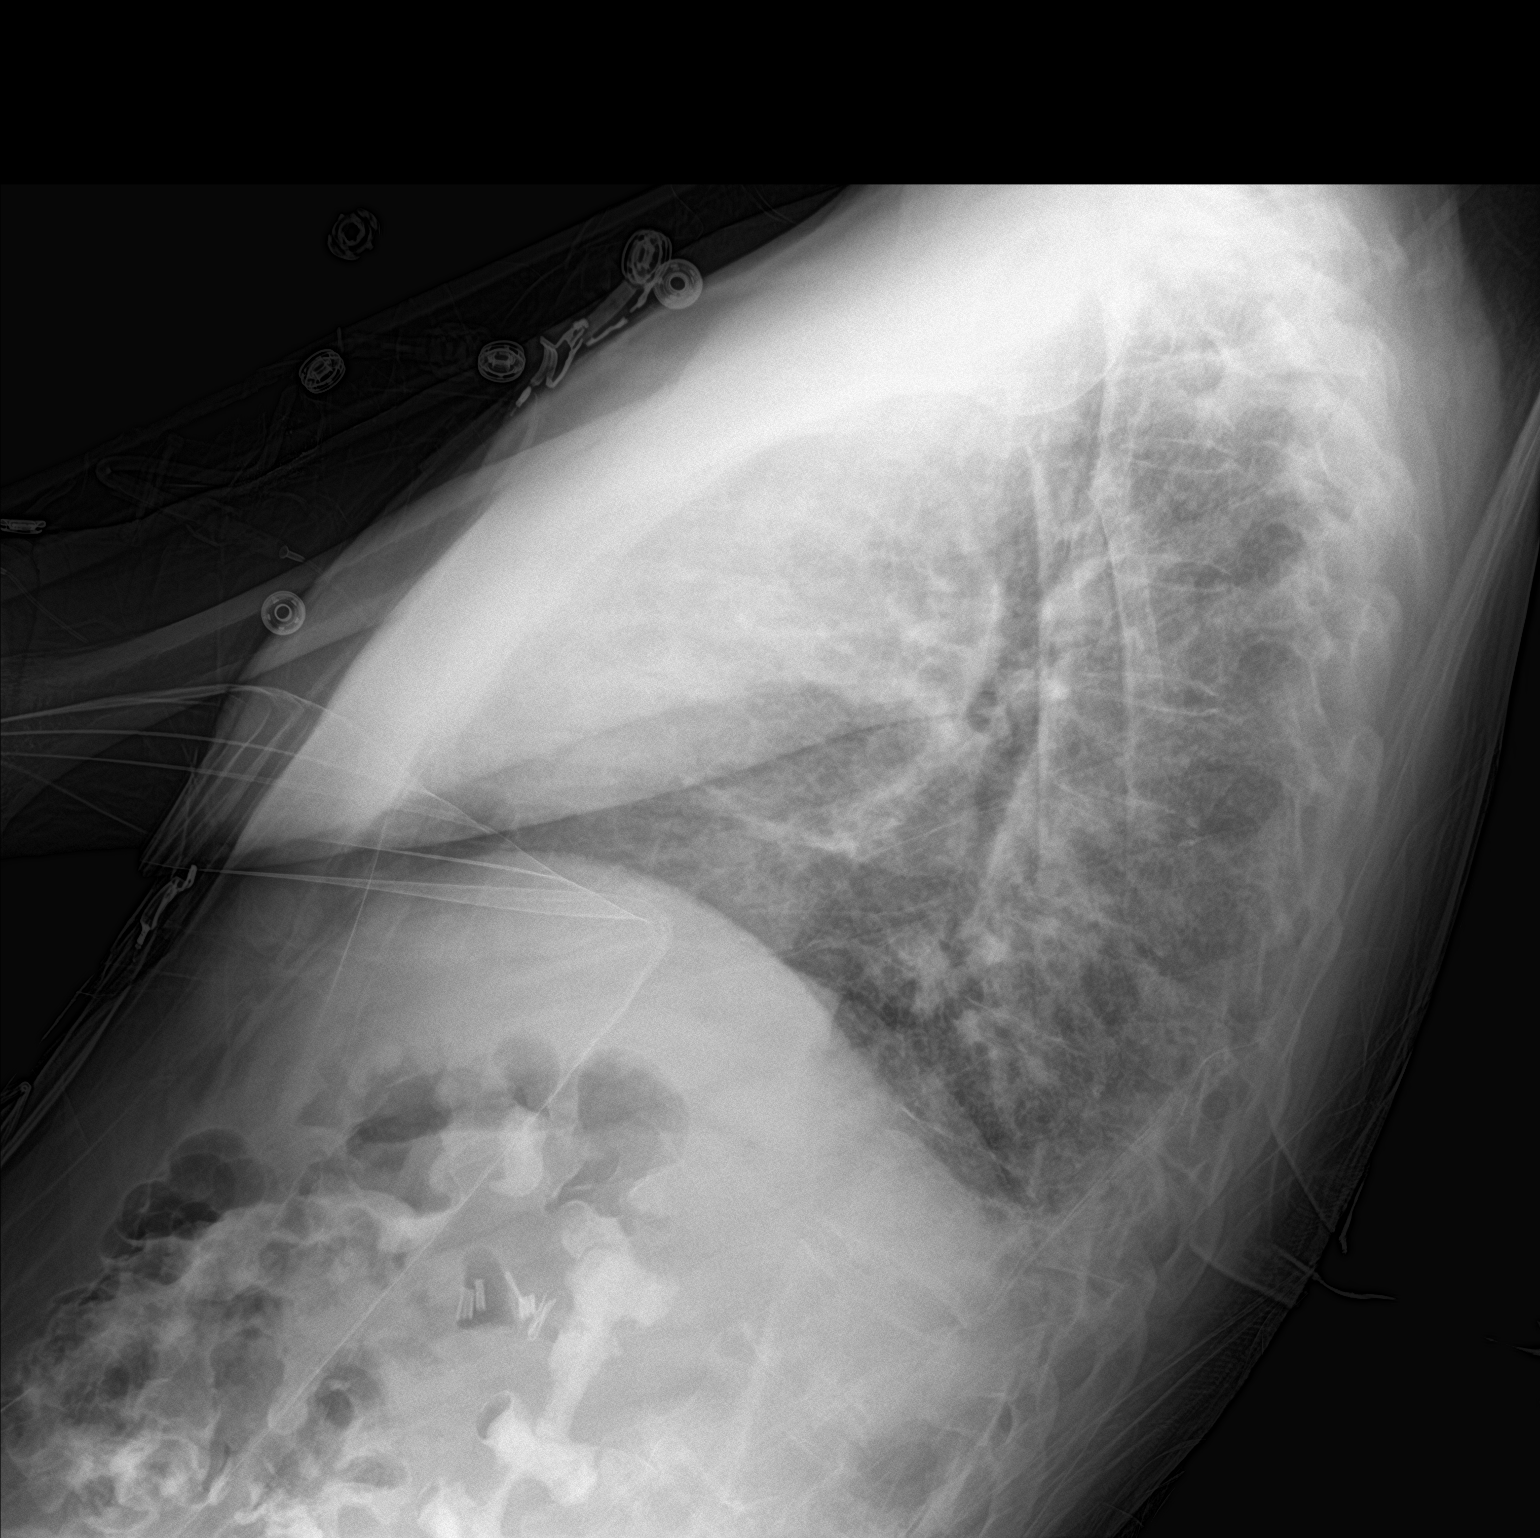

[chest ap]
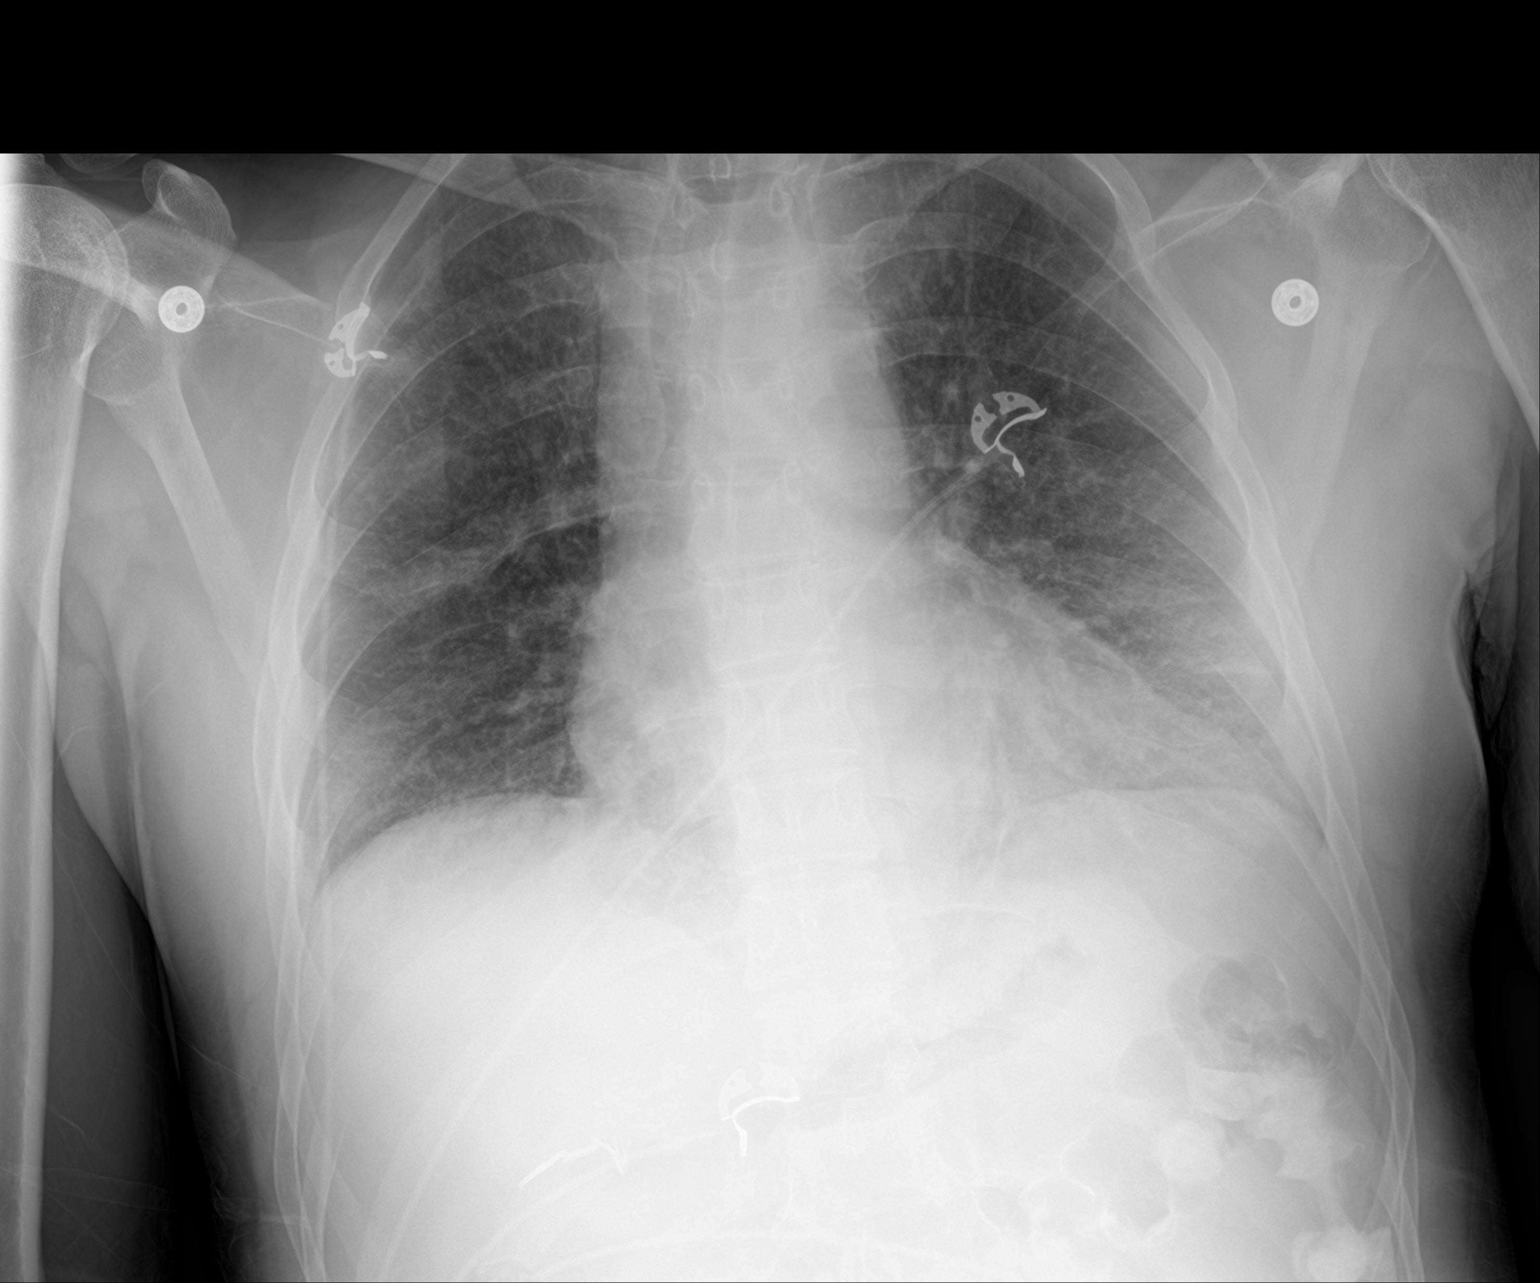

[2 of 2 positions shown; findings below may reference images not displayed]

FINDINGS: Low lung volumes. Cardiopericardial silhouette is at upper limits of
normal for size. There is pulmonary vascular congestion without
overt pulmonary edema. Interstitial markings are diffusely coarsened
with chronic features. Streaky atelectasis or scarring at the left
base is similar to prior. No dense airspace consolidation. No
pleural effusion. The visualized bony structures of the thorax are
intact. Telemetry leads overlie the chest.
IMPRESSION: Low lung volumes with vascular congestion. No overt pulmonary edema
or focal airspace consolidation.

Streaky opacity at the left base is similar to prior and likely
reflects atelectasis or scarring.

## 2019-06-11 MED ORDER — POLYETHYLENE GLYCOL 3350 17 G PO PACK
17.0000 g | PACK | Freq: Every day | ORAL | Status: DC
  Administered 2019-06-11: 17 g via ORAL
  Filled 2019-06-11: qty 1

## 2019-06-11 MED ORDER — ENSURE ENLIVE PO LIQD
237.0000 mL | Freq: Three times a day (TID) | ORAL | Status: DC
  Administered 2019-06-11: 237 mL via ORAL

## 2019-06-11 NOTE — Progress Notes (Addendum)
Initial Nutrition Assessment  DOCUMENTATION CODES:   Not applicable  INTERVENTION:  Calorie count per MD  Snacks TID  Continue Ensure Enlive po TID, each supplement provides 350 kcal and 20 grams of protein (thickened appropriately)   NUTRITION DIAGNOSIS:   Inadequate oral intake related to dysphagia as evidenced by meal completion < 50%.   GOAL:   Patient will meet greater than or equal to 90% of their needs   MONITOR:   PO intake, Supplement acceptance, Diet advancement, Weight trends, Labs, I & O's  REASON FOR ASSESSMENT:   Consult Assessment of nutrition requirement/status, Calorie Count  ASSESSMENT:   Pt with a PMH significant for tobacco use and chronic back pain admitted with multifocal stroke and basilar artery occlusion.  4/19 transferred out of ICU 4/20 failed BSE 4/21 MBS, Dysphagia 1 with honey thick liquids ordered  Discussed pt with RN.   Pt pending CIR.   Pt unable to provide detailed history at this time. Pt's son at bedside who reports pt typically ate a deli meat sandwich for lunch and a well-balanced dinner with some snacks in between. Pt's son states that pt has not appeared to have any wt loss, but noted potential wt gain. Per pt's son, pt does well with Magic Cup and pudding.  No wt history available for review.  PO Intake: 0-25% x last 8 recorded meals ( 10% average meal intake)  UOP: x24 hours I/O: +1,402.105ml since admit  Labs reviewed. Medications reviewed and include: Ensure Enlive TID, Miralax    NUTRITION - FOCUSED PHYSICAL EXAM:    Most Recent Value  Orbital Region  No depletion  Upper Arm Region  No depletion  Thoracic and Lumbar Region  No depletion  Buccal Region  No depletion  Temple Region  No depletion  Clavicle Bone Region  Mild depletion  Clavicle and Acromion Bone Region  Mild depletion  Scapular Bone Region  No depletion  Dorsal Hand  No depletion  Patellar Region  No depletion  Anterior Thigh Region   Mild depletion  Posterior Calf Region  Mild depletion  Edema (RD Assessment)  None  Hair  Reviewed  Eyes  Reviewed  Mouth  Reviewed  Skin  Reviewed  Nails  Reviewed       Diet Order:   Diet Order            DIET - DYS 1 Room service appropriate? No; Fluid consistency: Honey Thick; Fluid restriction: Other (see comments)  Diet effective now              EDUCATION NEEDS:   Not appropriate for education at this time  Skin:  Skin Assessment: Reviewed RN Assessment  Last BM:  4/20  Height:   Ht Readings from Last 1 Encounters:  06/03/19 5\' 7"  (1.702 m)    Weight:   Wt Readings from Last 1 Encounters:  06/03/19 71.1 kg    BMI:  Body mass index is 24.55 kg/m.  Estimated Nutritional Needs:   Kcal:  1800-2000  Protein:  90-100 grams  Fluid:  >/= 1.8 L/d    06/05/19, MS, RD, LDN RD pager number and weekend/on-call pager number located in Amion.

## 2019-06-11 NOTE — Progress Notes (Signed)
Started calorie count on patient.Incentive spirometer initiated with no success. Patient couldn't use the IS, tried multiple times. Report to night nurse to continue trying.

## 2019-06-11 NOTE — Progress Notes (Signed)
Family Medicine Teaching Service Daily Progress Note Intern Pager: [phone]  Patient name: Adrian [name] Medical record number: [id number] Date of birth: 12/26/1965 Age: 54 y.o. Gender: male  Primary Care Provider: Marin Comment, FNP Consultants: Neurology, CCM Code Status: Full  Pt Overview and Major Events to Date:  4/14: Admitted for multifocal stroke and basilar artery occlusion, admitted to ICU for pressure support, started on ASA, Plavix, Statin  4/16: orthostatic hypotension with neurological worsening that improved with IV hydration and vasopressors, started on IV Heparin for triple therapy 4/19: transferred out of ICU 4/20: failed swallow study 4/21: Repeat CTA head and neck, MBS study  Assessment and Plan: [name] a 53 y.o.malewith past medical history significant for heavy tobacco use(4 packs/day) and chronic back pain, who presented withsudden onset dizziness, vomiting, diffuse generalized weakness and severe dysarthriaand found to have multifocal subacute infarcts (primarily posterior circulation) with basilar artery occlusion.   Multifocal Stroke  Basal Artery Occlusion: Stable.  Continues to have severe dysarthria and weakness.  -Neurology following, continue antiplatelet therapy x3 months then ASA alone -Restart IV fluids at 104mL/h per neurology -Continue atorvastatin 80 mg daily -Continue Protonix 40 mg daily -Chest x-ray two-view -Vital signs per unit -Neurochecks every 4 -Monitor BP -PT/OT following -Speech following -CIR pending approval -Bilateral space boots to prevent foot drop  High risk for aspiration Unable to cough up secretions.  Concern for aspiration.  Coarse crackles heard on lung exam.  Remains afebrile and hemodynamically stable.  Last WBC 19.1 trending down. -Two-view chest x-ray -Monitor fever curve -CBC in a.m. -Pulmonary toilet  HLD: Evaluate lipid panel on admission -Continue atorvastatin 80 mg  Tobacco Use:  4 pack  a day smoker x20 years -Continue bowel program  -Continue NicoDerm patch  Chronic Back Pain: -Continue Tylenol as needed -Continue gabapentin 600 mg 3 times daily -K pad as needed -Tylenol 50 mg every 6 as needed  Anxiety  Agitation: Continue to monitor   FEN/GI:  -Dysphagia 1, pured diet with honey thickened fluids PPx:  -Lovenox  Disposition: Medically Stable for CIR pending approval  Subjective:  No acute events overnight.  Follows commands but continues to have severe dysarthria.  Unable to clear secretions.   Objective: Temp:  [97.8 F (36.6 C)-99.2 F (37.3 C)] 98 F (36.7 C) (04/23 0354) Pulse Rate:  [77-103] 82 (04/23 0444) Resp:  [17-22] 20 (04/23 0444) BP: (95-148)/(48-102) 135/98 (04/23 0354) SpO2:  [91 %-100 %] 95 % (04/23 0444) Physical Exam:  General: 54 year old male in no acute distress. Cardiovascular: Regular rate and rhythm, no murmurs or gallops appreciated.  Distal pulses present Respiratory: Worsening coarse crackles but good air entry, unable to clear secretions.  No increased work of breathing. Abdomen: Soft, nontender, nondistended.  Bowel sounds present Extremities: No lower extremity edema Neuro: Alert and obeys commands.  Continues to have severe dysarthria and upper/lower extremity weakness.   Laboratory: Recent Labs  Lab 06/08/19 0507 06/09/19 0427 06/10/19 0546  WBC 23.9* 20.1* 19.4*  HGB 15.3 14.5 14.3  HCT 45.7 43.4 42.9  PLT 250 233 239   Recent Labs  Lab 06/08/19 0507 06/09/19 0427 06/10/19 0546  NA 139 140 139  K 3.6 3.5 3.7  CL 108 109 108  CO2 18* 21* 19*  BUN 18 20 17   CREATININE 0.87 0.78 0.76  CALCIUM 9.0 8.7* 8.8*  GLUCOSE 111* 104* 93     Imaging/Diagnostic Tests: No results found.  , MD 06/11/2019, 6:50 AM PGY-1, [facility]  [place] Intern pager: [phone], text pages welcome

## 2019-06-11 NOTE — Progress Notes (Signed)
Physical Therapy Treatment Patient Details Name: Adrian [name] MRN: [id number] DOB: 12/12/1965 Today's Date: 06/11/2019    History of Present Illness 54 y.o. male admitted on 06/02/19 for dysarthria and weakness.  Pt's MRI revealed numerous small supatentorial and infratentorial acute infarcts involving the verebral hemispheres, left thalamus, pons and right cerebellar hemisphere (embolic).  No tPA given due to outside of window.  Pt with significant PMH of tobacco abuse and chronic low back pain. Neuro changes the evening of 06/03/19--pt placed back on bedrest until 06/07/19.      PT Comments    Patient is making progress toward PT goals and demonstrates great effort with therapy. Pt presents with improving bilat UE AROM and strength (R > L). Son present and very supportive and participatory in pt's care. Continue to recommend CIR for further skilled PT services to maximize independence and safety with mobility.     Follow Up Recommendations  CIR     Equipment Recommendations  Other (comment)(TBD pending progress)    Recommendations for Other Services       Precautions / Restrictions Precautions Precautions: Fall;Other (comment) Precaution Comments: check BP on left arm due to occlusion on R side. Restrictions Weight Bearing Restrictions: No    Mobility  Bed Mobility Overal bed mobility: Needs Assistance Bed Mobility: Rolling;Sidelying to Sit;Sit to Sidelying           General bed mobility comments: total A +2 for all bed mobility; pt able to prop on R UE breifly when in sidelying   Transfers                    Ambulation/Gait                 Stairs             Wheelchair Mobility    Modified Rankin (Stroke Patients Only) Modified Rankin (Stroke Patients Only) Pre-Morbid Rankin Score: No symptoms Modified Rankin: Severe disability     Balance Overall balance assessment: Needs assistance Sitting-balance support: Feet supported;No upper  extremity supported;Bilateral upper extremity supported Sitting balance-Leahy Scale: Zero Sitting balance - Comments: no righting reaction at trunk, able to hold position with bilateral elbows on quads, attempted pushing off of bed in NDT technique on R elbow, trace movement noted   Standing balance support: Bilateral upper extremity supported Standing balance-Leahy Scale: Poor Standing balance comment: needed mod two person assist to stand for balance and to block R LE.                            Cognition Arousal/Alertness: Awake/alert Behavior During Therapy: Flat affect Overall Cognitive Status: Difficult to assess                   Orientation Level: (Able to recognize family )             General Comments: pt following simple cues as physically able; pt working very hard during session; attempting to communicate with facial gestures       Exercises Other Exercises Other Exercises: AAROM R elbow felxion/extension and shoulder extension Other Exercises: AAROM L shoulder IR; no active external or elbow flexion/extension Other Exercises: shoulder shrugs; active movement on R     General Comments General comments (skin integrity, edema, etc.): improved AROM/strength of bilat UE (R < L)      Pertinent Vitals/Pain Pain Assessment: Faces Faces Pain Scale: Hurts little more Pain Location: back-chronic Pain  Descriptors / Indicators: Grimacing;Discomfort Pain Intervention(s): Monitored during session;Repositioned    Home Living                      Prior Function            PT Goals (current goals can now be found in the care plan section) Progress towards PT goals: Progressing toward goals    Frequency    Min 4X/week      PT Plan Current plan remains appropriate    Co-evaluation              AM-PAC PT "6 Clicks" Mobility   Outcome Measure  Help needed turning from your back to your side while in a flat bed without using  bedrails?: Total Help needed moving from lying on your back to sitting on the side of a flat bed without using bedrails?: Total Help needed moving to and from a bed to a chair (including a wheelchair)?: Total Help needed standing up from a chair using your arms (e.g., wheelchair or bedside chair)?: Total Help needed to walk in hospital room?: Total Help needed climbing 3-5 steps with a railing? : Total 6 Click Score: 6    End of Session   Activity Tolerance: Patient tolerated treatment well Patient left: with call bell/phone within reach;with family/visitor present;in bed;with bed alarm set Nurse Communication: Mobility status PT Visit Diagnosis: Muscle weakness (generalized) (M62.81);Difficulty in walking, not elsewhere classified (R26.2);Hemiplegia and hemiparesis Hemiplegia - Right/Left: Right Hemiplegia - dominant/non-dominant: Dominant Hemiplegia - caused by: Cerebral infarction     Time: 1100-1136 PT Time Calculation (min) (ACUTE ONLY): 36 min  Charges:  $Therapeutic Activity: 8-22 mins $Neuromuscular Re-education: 8-22 mins                     Earney Navy, PTA Acute Rehabilitation Services Pager: [phone] Office: [phone]     Darliss Cheney 06/11/2019, 4:46 PM

## 2019-06-11 NOTE — Progress Notes (Signed)
Inpatient Rehab Admissions Coordinator:   I have a bed for this pt to admit to CIR on Saturday (06/12/2019).  Dr. Mauri Reading in agreement.  Rehab MD (Dr. Wynn Banker) to assess pt and confirm admission on Saturday.  Floor RN can call CIR at [phone] for report after 12pm on Saturday.  I have let pt/family and case manager know.    Estill Dooms, PT, DPT Admissions Coordinator [phone] 06/11/19  2:12 PM

## 2019-06-12 ENCOUNTER — Inpatient Hospital Stay (HOSPITAL_COMMUNITY)
Admission: RE | Admit: 2019-06-12 | Discharge: 2019-07-09 | DRG: 056 | Disposition: A | Discharge status: Home Health | Payer: Medicaid Other | Source: Intra-hospital | Attending: Physical Medicine & Rehabilitation | Admitting: Physical Medicine & Rehabilitation

## 2019-06-12 DIAGNOSIS — F419 Anxiety disorder, unspecified: Secondary | ICD-10-CM | POA: Diagnosis present

## 2019-06-12 DIAGNOSIS — F09 Unspecified mental disorder due to known physiological condition: Secondary | ICD-10-CM

## 2019-06-12 DIAGNOSIS — F17210 Nicotine dependence, cigarettes, uncomplicated: Secondary | ICD-10-CM | POA: Diagnosis present

## 2019-06-12 DIAGNOSIS — Z79899 Other long term (current) drug therapy: Secondary | ICD-10-CM | POA: Diagnosis not present

## 2019-06-12 DIAGNOSIS — M545 Low back pain, unspecified: Secondary | ICD-10-CM

## 2019-06-12 DIAGNOSIS — Z888 Allergy status to other drugs, medicaments and biological substances status: Secondary | ICD-10-CM | POA: Diagnosis not present

## 2019-06-12 DIAGNOSIS — I69354 Hemiplegia and hemiparesis following cerebral infarction affecting left non-dominant side: Secondary | ICD-10-CM | POA: Diagnosis present

## 2019-06-12 DIAGNOSIS — G8929 Other chronic pain: Secondary | ICD-10-CM | POA: Diagnosis present

## 2019-06-12 DIAGNOSIS — R252 Cramp and spasm: Secondary | ICD-10-CM | POA: Diagnosis present

## 2019-06-12 DIAGNOSIS — F329 Major depressive disorder, single episode, unspecified: Secondary | ICD-10-CM | POA: Diagnosis present

## 2019-06-12 DIAGNOSIS — K0889 Other specified disorders of teeth and supporting structures: Secondary | ICD-10-CM | POA: Diagnosis present

## 2019-06-12 DIAGNOSIS — L89154 Pressure ulcer of sacral region, stage 4: Secondary | ICD-10-CM | POA: Diagnosis present

## 2019-06-12 DIAGNOSIS — I69391 Dysphagia following cerebral infarction: Secondary | ICD-10-CM

## 2019-06-12 DIAGNOSIS — L219 Seborrheic dermatitis, unspecified: Secondary | ICD-10-CM | POA: Diagnosis present

## 2019-06-12 DIAGNOSIS — R0989 Other specified symptoms and signs involving the circulatory and respiratory systems: Secondary | ICD-10-CM | POA: Diagnosis present

## 2019-06-12 DIAGNOSIS — I69365 Other paralytic syndrome following cerebral infarction, bilateral: Principal | ICD-10-CM

## 2019-06-12 DIAGNOSIS — R030 Elevated blood-pressure reading, without diagnosis of hypertension: Secondary | ICD-10-CM

## 2019-06-12 DIAGNOSIS — D72829 Elevated white blood cell count, unspecified: Secondary | ICD-10-CM | POA: Diagnosis present

## 2019-06-12 DIAGNOSIS — J9811 Atelectasis: Secondary | ICD-10-CM | POA: Diagnosis present

## 2019-06-12 DIAGNOSIS — Z885 Allergy status to narcotic agent status: Secondary | ICD-10-CM | POA: Diagnosis not present

## 2019-06-12 DIAGNOSIS — Z6820 Body mass index (BMI) 20.0-20.9, adult: Secondary | ICD-10-CM

## 2019-06-12 DIAGNOSIS — I651 Occlusion and stenosis of basilar artery: Secondary | ICD-10-CM | POA: Diagnosis present

## 2019-06-12 DIAGNOSIS — G8110 Spastic hemiplegia affecting unspecified side: Secondary | ICD-10-CM

## 2019-06-12 DIAGNOSIS — Z8249 Family history of ischemic heart disease and other diseases of the circulatory system: Secondary | ICD-10-CM

## 2019-06-12 DIAGNOSIS — Z880 Allergy status to penicillin: Secondary | ICD-10-CM

## 2019-06-12 DIAGNOSIS — I639 Cerebral infarction, unspecified: Secondary | ICD-10-CM | POA: Diagnosis present

## 2019-06-12 DIAGNOSIS — E785 Hyperlipidemia, unspecified: Secondary | ICD-10-CM | POA: Diagnosis present

## 2019-06-12 DIAGNOSIS — E46 Unspecified protein-calorie malnutrition: Secondary | ICD-10-CM | POA: Diagnosis present

## 2019-06-12 DIAGNOSIS — R1310 Dysphagia, unspecified: Secondary | ICD-10-CM | POA: Diagnosis present

## 2019-06-12 DIAGNOSIS — S069XAS Unspecified intracranial injury with loss of consciousness status unknown, sequela: Secondary | ICD-10-CM

## 2019-06-12 DIAGNOSIS — I951 Orthostatic hypotension: Secondary | ICD-10-CM | POA: Diagnosis present

## 2019-06-12 DIAGNOSIS — I69322 Dysarthria following cerebral infarction: Secondary | ICD-10-CM

## 2019-06-12 DIAGNOSIS — L89626 Pressure-induced deep tissue damage of left heel: Secondary | ICD-10-CM | POA: Diagnosis present

## 2019-06-12 DIAGNOSIS — I69320 Aphasia following cerebral infarction: Secondary | ICD-10-CM | POA: Diagnosis not present

## 2019-06-12 DIAGNOSIS — L8990 Pressure ulcer of unspecified site, unspecified stage: Secondary | ICD-10-CM | POA: Insufficient documentation

## 2019-06-12 LAB — BASIC METABOLIC PANEL
Anion gap: 11 (ref 5–15)
BUN: 23 mg/dL — ABNORMAL HIGH (ref 6–20)
CO2: 24 mmol/L (ref 22–32)
Calcium: 9.1 mg/dL (ref 8.9–10.3)
Chloride: 108 mmol/L (ref 98–111)
Creatinine, Ser: 0.8 mg/dL (ref 0.61–1.24)
GFR calc Af Amer: >60 mL/min (ref >60)
GFR calc non Af Amer: >60 mL/min (ref >60)
Glucose, Bld: 108 mg/dL — ABNORMAL HIGH (ref 70–99)
Potassium: 3.9 mmol/L (ref 3.5–5.1)
Sodium: 143 mmol/L (ref 135–145)

## 2019-06-12 LAB — CBC
HCT: 43.6 % (ref 39.0–52.0)
Hemoglobin: 14.4 g/dL (ref 13.0–17.0)
MCH: 30.8 pg (ref 26.0–34.0)
MCHC: 33 g/dL (ref 30.0–36.0)
MCV: 93.4 fL (ref 80.0–100.0)
Platelets: 284 10*3/uL (ref 150–400)
RBC: 4.67 MIL/uL (ref 4.22–5.81)
RDW: 15 % (ref 11.5–15.5)
WBC: 15.5 10*3/uL — ABNORMAL HIGH (ref 4.0–10.5)
nRBC: 0 % (ref 0.0–0.2)

## 2019-06-12 LAB — GLUCOSE, CAPILLARY: Glucose-Capillary: 127 mg/dL — ABNORMAL HIGH (ref 70–99)

## 2019-06-12 MED ORDER — SODIUM CHLORIDE 0.9 % IV SOLN: INTRAVENOUS | Status: DC

## 2019-06-12 MED ORDER — SORBITOL 70 % SOLN
30.0000 mL | Freq: Every day | Status: DC | PRN
  Administered 2019-06-13 – 2019-07-08 (×4): 30 mL via ORAL
  Filled 2019-06-12 (×5): qty 30

## 2019-06-12 MED ORDER — NICOTINE 21 MG/24HR TD PT24
21.0000 mg | MEDICATED_PATCH | Freq: Every day | TRANSDERMAL | Status: DC
  Administered 2019-06-13 – 2019-07-06 (×23): 21 mg via TRANSDERMAL
  Filled 2019-06-12 (×24): qty 1

## 2019-06-12 MED ORDER — PANTOPRAZOLE SODIUM 40 MG PO PACK: 40.0000 mg | PACK | Freq: Every day | ORAL | 0 refills | Status: DC

## 2019-06-12 MED ORDER — GABAPENTIN 250 MG/5ML PO SOLN: 600.0000 mg | Freq: Three times a day (TID) | ORAL | 0 refills | Status: DC

## 2019-06-12 MED ORDER — NICOTINE 21 MG/24HR TD PT24: 21.0000 mg | MEDICATED_PATCH | Freq: Every day | TRANSDERMAL | 0 refills | Status: DC

## 2019-06-12 MED ORDER — TRAMADOL HCL 50 MG PO TABS: 50.0000 mg | ORAL_TABLET | Freq: Four times a day (QID) | ORAL | 0 refills | Status: DC | PRN

## 2019-06-12 MED ORDER — ENOXAPARIN SODIUM 40 MG/0.4ML ~~LOC~~ SOLN
40.0000 mg | SUBCUTANEOUS | Status: DC
  Administered 2019-06-13 – 2019-07-08 (×26): 40 mg via SUBCUTANEOUS
  Filled 2019-06-12 (×27): qty 0.4

## 2019-06-12 MED ORDER — PANTOPRAZOLE SODIUM 40 MG PO PACK
40.0000 mg | PACK | Freq: Every day | ORAL | Status: DC
  Administered 2019-06-12 – 2019-07-09 (×23): 40 mg via ORAL
  Filled 2019-06-12 (×11): qty 20

## 2019-06-12 MED ORDER — WHITE PETROLATUM EX OINT
1.0000 "application " | TOPICAL_OINTMENT | CUTANEOUS | Status: DC | PRN
  Filled 2019-06-12 (×2): qty 28.35

## 2019-06-12 MED ORDER — ENOXAPARIN SODIUM 40 MG/0.4ML ~~LOC~~ SOLN
40.0000 mg | SUBCUTANEOUS | Status: DC
Start: 2019-06-12 — End: 2019-06-12

## 2019-06-12 MED ORDER — ASPIRIN 300 MG RE SUPP
300.0000 mg | Freq: Every day | RECTAL | Status: DC
  Filled 2019-06-12 (×15): qty 1

## 2019-06-12 MED ORDER — POLYETHYLENE GLYCOL 3350 17 G PO PACK: 17.0000 g | PACK | Freq: Every day | ORAL | 0 refills | Status: DC

## 2019-06-12 MED ORDER — BUPROPION HCL 100 MG PO TABS
150.0000 mg | ORAL_TABLET | Freq: Two times a day (BID) | ORAL | Status: DC
  Administered 2019-06-12 – 2019-07-09 (×53): 150 mg via ORAL
  Filled 2019-06-12 (×54): qty 2

## 2019-06-12 MED ORDER — GABAPENTIN 250 MG/5ML PO SOLN
600.0000 mg | Freq: Three times a day (TID) | ORAL | Status: DC
  Administered 2019-06-12 – 2019-06-16 (×13): 600 mg via ORAL
  Filled 2019-06-12 (×16): qty 12

## 2019-06-12 MED ORDER — ACETAMINOPHEN 325 MG PO TABS
325.0000 mg | ORAL_TABLET | ORAL | Status: DC | PRN
  Administered 2019-06-12 – 2019-07-06 (×36): 650 mg via ORAL
  Filled 2019-06-12 (×37): qty 2

## 2019-06-12 MED ORDER — ATORVASTATIN CALCIUM 80 MG PO TABS
80.0000 mg | ORAL_TABLET | Freq: Every day | ORAL | Status: DC
  Administered 2019-06-13 – 2019-07-09 (×27): 80 mg via ORAL
  Filled 2019-06-12 (×27): qty 1

## 2019-06-12 MED ORDER — CLOPIDOGREL BISULFATE 75 MG PO TABS: 75.0000 mg | ORAL_TABLET | Freq: Every day | ORAL | 0 refills | Status: DC

## 2019-06-12 MED ORDER — TRAMADOL HCL 50 MG PO TABS
50.0000 mg | ORAL_TABLET | Freq: Four times a day (QID) | ORAL | Status: DC | PRN
  Administered 2019-06-12 – 2019-06-28 (×40): 50 mg via ORAL
  Filled 2019-06-12 (×45): qty 1

## 2019-06-12 MED ORDER — ATORVASTATIN CALCIUM 80 MG PO TABS: 80.0000 mg | ORAL_TABLET | Freq: Every day | ORAL | 0 refills | Status: DC

## 2019-06-12 MED ORDER — CLOPIDOGREL BISULFATE 75 MG PO TABS
75.0000 mg | ORAL_TABLET | Freq: Every day | ORAL | Status: DC
  Administered 2019-06-13 – 2019-07-09 (×27): 75 mg via ORAL
  Filled 2019-06-12 (×27): qty 1

## 2019-06-12 MED ORDER — BUPROPION HCL 75 MG PO TABS: 150.0000 mg | ORAL_TABLET | Freq: Two times a day (BID) | ORAL | 0 refills | Status: DC

## 2019-06-12 MED ORDER — RESOURCE THICKENUP CLEAR PO POWD
ORAL | Status: DC | PRN
  Administered 2019-06-15: 2.8 g via ORAL
  Filled 2019-06-12: qty 125

## 2019-06-12 MED ORDER — MIRTAZAPINE 15 MG PO TABS: 15.0000 mg | ORAL_TABLET | Freq: Every day | ORAL | 2 refills | Status: DC

## 2019-06-12 MED ORDER — GUAIFENESIN-DM 100-10 MG/5ML PO SYRP
5.0000 mL | ORAL_SOLUTION | ORAL | Status: DC | PRN
  Administered 2019-06-17: 5 mL via ORAL
  Filled 2019-06-12: qty 5

## 2019-06-12 MED ORDER — ASPIRIN 325 MG PO TABS
325.0000 mg | ORAL_TABLET | Freq: Every day | ORAL | Status: DC
  Administered 2019-06-13 – 2019-07-09 (×27): 325 mg via ORAL
  Filled 2019-06-12 (×27): qty 1

## 2019-06-12 NOTE — H&P (Signed)
Physical Medicine and Rehabilitation Admission H&P        Chief Complaint  Patient presents with  . Weakness  . Aphasia  : HPI: Ulice Follett. Inge is a 54 year old right-handed male with history of tobacco abuse and chronic low back pain maintained on Neurontin 600 mg 3 times daily as well as prednisone pack.. History taken from chart review wife and son due to patient speech and cognition. Independent prior to admission. Presented 06/02/2019 with dysarthria and quadriparesis. Admission chemistries with WBC 21,000, BUN 24, urine drug screen negative. CT showed multiple bilateral infarcts. Per report numerous small supratentorial and infratentorial acute infarcts involving the cerebral hemispheres, left thalamus, pons and right cerebellar hemisphere. Chronic right frontoparietal cortical infarcts. Follow-up MRI showed slight increase in size of bilateral acute infarcts of the pons. Unchanged infarcts of the left thalamus left caudate nucleus, right cerebellum and right frontal white matter. No hemorrhage or mass-effect.. Patient did not receive TPA. 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 Occlusive thrombus also suspected within the very distal V4 left vertebral artery. Echocardiogram with ejection fraction of 70% grade 1 diastolic dysfunction. EEG negative for seizure.  CTA head and neck repeated 06/09/2019 unchanged multivessel occlusion.  Recommendations are DAPT of aspirin 325 mg and Plavix 75 mg daily.  Patient had initially been maintained on intravenous heparin which is since been discontinued and currently maintained on Lovenox.  Dysphagia #1 honey thick liquid diet monitoring of hydration and still with ongoing secretion management.  Patient very labile as well as anxious and placed on Wellbutrin as well as the addition of Seroquel as needed.  Bouts of orthostasis  initially and did require fluid resuscitation.  His Levophed has been discontinued.  Noted leukocytosis as patient had been on initial prednisone x4 days prior to admission for chronic back pain since discontinued latest WBC 19,400 and latest chest x-ray no focal consolidation.  Therapy evaluations completed and patient was admitted for a comprehensive rehab program.   Review of Systems  Constitutional: Negative for chills and fever.  HENT: Negative for hearing loss.   Eyes: Negative for double vision.  Respiratory: Positive for cough and sputum production.   Cardiovascular: Negative for chest pain, palpitations and leg swelling.  Gastrointestinal: Positive for constipation. Negative for heartburn, nausea and vomiting.  Genitourinary: Negative for dysuria, flank pain and hematuria.  Musculoskeletal: Positive for myalgias.  Skin: Negative for rash.  Neurological: Positive for speech change and weakness.  All other systems reviewed and are negative.       Past Medical History:  Diagnosis Date  . Tobacco abuse      History reviewed. No pertinent surgical history.      Family History  Problem Relation Age of Onset  . Hypertension Mother    . Hypertension Father      Social History:  reports that he has been smoking cigarettes. He has been smoking about 4.00 packs per day. He does not have any smokeless tobacco history on file. No history on file for alcohol and drug. Allergies:  Allergies  Allergen Reactions  . Codeine Other (See Comments)      unknown  . Motrin [Ibuprofen] Other (See Comments)      unknown  . Penicillins Other (See Comments)      unknown          Medications Prior to Admission  Medication Sig Dispense Refill  .  gabapentin (NEURONTIN) 600 MG tablet Take 600 mg by mouth 3 (three) times daily.      . pantoprazole (PROTONIX) 40 MG tablet Take 40 mg by mouth daily.      . predniSONE (DELTASONE) 10 MG tablet Take 10 mg by mouth See admin instructions. Day 5-8 Take  one tablet four times daily Day 9-12 Take one tablet twice daily          Drug Regimen Review Drug regimen was reviewed and remains appropriate with no significant issues identified   Home: Home Living Family/patient expects to be discharged to:: Private residence Living Arrangements: Spouse/significant other, Children(son and wife patricia) Available Help at Discharge: Family Type of Home: House Additional Comments: Pt frustrated by dysarthria, did not like answering too many questions, will need more info  Lives With: Family   Functional History: Prior Function Level of Independence: Independent Comments: 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"   Functional Status:  Mobility: Bed Mobility Overal bed mobility: Needs Assistance Bed Mobility: Rolling, Sidelying to Sit, Sit to Sidelying Rolling: Total assist, +2 for physical assistance Sidelying to sit: +2 for physical assistance, Total assist Supine to sit: +2 for physical assistance, Mod assist Sit to supine: +2 for physical assistance, Total assist Sit to sidelying: Total assist, +2 for physical assistance General bed mobility comments: total A +2 for all bed mobility; minimal movement on R UE and LE with multimodal cues/assist prior to sitting EOB but not while sitting EOB  Transfers Overall transfer level: Needs assistance Equipment used: 2 person hand held assist Transfer via Lift Equipment: [employer] Transfers: Sit to/from Stand, Pharmacologist Sit to Stand: +2 physical assistance, Mod assist Stand pivot transfers: +2 physical assistance, Mod assist General transfer comment: Used maxi sky total lift for OOB to chair for increased upright posture, repositioning.  Ambulation/Gait General Gait Details: unable at this time.    ADL: ADL Overall ADL's : Needs assistance/impaired Eating/Feeding: NPO Grooming: Wash/dry face, Minimal assistance, Sitting Upper Body Bathing: Moderate assistance, Sitting Lower  Body Bathing: Maximal assistance, Sit to/from stand, +2 for safety/equipment, +2 for physical assistance Upper Body Dressing : Moderate assistance, Sitting Lower Body Dressing: Maximal assistance, +2 for physical assistance, +2 for safety/equipment, Sit to/from stand Toilet Transfer: +2 for physical assistance, +2 for safety/equipment, Stand-pivot, Moderate assistance Functional mobility during ADLs: Moderate assistance, +2 for physical assistance, +2 for safety/equipment(stand pivot)   Cognition: Cognition Overall Cognitive Status: Difficult to assess Arousal/Alertness: Lethargic Orientation Level: Oriented to person, Oriented to place, Oriented to situation, Disoriented to time Attention: Sustained Sustained Attention: Impaired Sustained Attention Impairment: Functional basic, Verbal basic Behaviors: Lability Cognition Arousal/Alertness: Awake/alert Behavior During Therapy: (emotionally labile) Overall Cognitive Status: Difficult to assess Area of Impairment: Problem solving, Following commands Orientation Level: (seems to recognize family) Current Attention Level: Sustained Memory: Decreased recall of precautions, Decreased short-term memory Following Commands: Follows one step commands inconsistently, Follows one step commands with increased time(to the best of his physical ability) Safety/Judgement: Decreased awareness of safety, Decreased awareness of deficits Awareness: Intellectual Problem Solving: Requires verbal cues, Requires tactile cues, Decreased initiation, Difficulty sequencing General Comments: pt nodding head yes/no in response to questions; crying at times seemingly in response to mobility impairment Difficult to assess due to: Impaired communication   Physical Exam: Blood pressure 105/82, pulse 86, temperature 98.4 F (36.9 C), temperature source Oral, resp. rate 18, height 5\' 7"  (1.702 m), weight 71.1 kg, SpO2 95 %. Physical Exam  Neurological:  Patient is  alert .  Makes eye contact with examiner.  Severely dysarthric.  Follows simple commands.    General: No acute distress Mood and affect no lability or agitation Heart: Regular rate and rhythm no rubs murmurs or extra sounds Lungs: Clear to auscultation, breathing unlabored, no rales or wheezes, coarse upper airway sounds, no respiratory distress Abdomen: Positive bowel sounds, soft nontender to palpation, nondistended Extremities: No clubbing, cyanosis, or edema Skin: No evidence of breakdown, no evidence of rash Neurologic: Cranial nerves II through XII intact, motor strength is 0/5 in bilateral deltoid,2- L and 0/5 R  Bicep,0/5 bilateral  tricep, grip, hip flexor, knee extensors, ankle dorsiflexor and plantar flexor Sensory exam cannot assess due to anarthria Cerebellar exam unable to assess due to weakness Musculoskeletal: No pain with PROM BUE and BLE    Lab Results Last 48 Hours        Results for orders placed or performed during the hospital encounter of 06/02/19 (from the past 48 hour(s))  CBC     Status: Abnormal    Collection Time: 06/08/19  5:07 AM  Result Value Ref Range    WBC 23.9 (H) 4.0 - 10.5 K/uL    RBC 5.09 4.22 - 5.81 MIL/uL    Hemoglobin 15.3 13.0 - 17.0 g/dL    HCT 04.5 40.9 - 81.1 %    MCV 89.8 80.0 - 100.0 fL    MCH 30.1 26.0 - 34.0 pg    MCHC 33.5 30.0 - 36.0 g/dL    RDW 91.4 78.2 - 95.6 %    Platelets 250 150 - 400 K/uL    nRBC 0.0 0.0 - 0.2 %      Comment: Performed at [facility], [address] [place], [place] [address]  Basic metabolic panel     Status: Abnormal    Collection Time: 06/08/19  5:07 AM  Result Value Ref Range    Sodium 139 135 - 145 mmol/L    Potassium 3.6 3.5 - 5.1 mmol/L    Chloride 108 98 - 111 mmol/L    CO2 18 (L) 22 - 32 mmol/L    Glucose, Bld 111 (H) 70 - 99 mg/dL      Comment: Glucose reference range applies only to samples taken after fasting for at least 8 hours.    BUN 18 6 - 20 mg/dL    Creatinine, Ser 6.57 0.61  - 1.24 mg/dL    Calcium 9.0 8.9 - 84.6 mg/dL    GFR calc non Af Amer >60 >60 mL/min    GFR calc Af Amer >60 >60 mL/min    Anion gap 13 5 - 15      Comment: Performed at [facility], [address] [place], [place] [address]  Heparin level (unfractionated)     Status: Abnormal    Collection Time: 06/08/19  5:07 AM  Result Value Ref Range    Heparin Unfractionated <0.10 (L) 0.30 - 0.70 IU/mL      Comment: (NOTE) If heparin results are below expected values, and patient dosage has  been confirmed, suggest follow up testing of antithrombin III levels. Performed at [facility], [address], [place], [place] [address]    Heparin level (unfractionated)     Status: Abnormal    Collection Time: 06/08/19  1:50 PM  Result Value Ref Range    Heparin Unfractionated 0.17 (L) 0.30 - 0.70 IU/mL      Comment: (NOTE) If heparin results are below expected values, and patient dosage  has  been confirmed, suggest follow up testing of antithrombin III levels. Performed at [facility], [address] [place], [place] [address]    Heparin level (unfractionated)     Status: None    Collection Time: 06/08/19  9:54 PM  Result Value Ref Range    Heparin Unfractionated 0.57 0.30 - 0.70 IU/mL      Comment: (NOTE) If heparin results are below expected values, and patient dosage has  been confirmed, suggest follow up testing of antithrombin III levels. Performed at [facility], [address], [place], [place] [address]    CBC     Status: Abnormal    Collection Time: 06/09/19  4:27 AM  Result Value Ref Range    WBC 20.1 (H) 4.0 - 10.5 K/uL    RBC 4.83 4.22 - 5.81 MIL/uL    Hemoglobin 14.5 13.0 - 17.0 g/dL    HCT 91.4 78.2 - 95.6 %    MCV 89.9 80.0 - 100.0 fL    MCH 30.0 26.0 - 34.0 pg    MCHC 33.4 30.0 - 36.0 g/dL    RDW 21.3 08.6 - 57.8 %    Platelets 233 150 - 400 K/uL    nRBC 0.0 0.0 - 0.2 %      Comment: Performed at [facility], [address]  [place], [place] [address]  Basic metabolic panel     Status: Abnormal    Collection Time: 06/09/19  4:27 AM  Result Value Ref Range    Sodium 140 135 - 145 mmol/L    Potassium 3.5 3.5 - 5.1 mmol/L    Chloride 109 98 - 111 mmol/L    CO2 21 (L) 22 - 32 mmol/L    Glucose, Bld 104 (H) 70 - 99 mg/dL      Comment: Glucose reference range applies only to samples taken after fasting for at least 8 hours.    BUN 20 6 - 20 mg/dL    Creatinine, Ser 9.52 0.61 - 1.24 mg/dL    Calcium 8.7 (L) 8.9 - 10.3 mg/dL    GFR calc non Af Amer >60 >60 mL/min    GFR calc Af Amer >60 >60 mL/min    Anion gap 10 5 - 15      Comment: Performed at [facility], [address], [place], [place] [address]  Heparin level (unfractionated)     Status: None    Collection Time: 06/09/19  4:27 AM  Result Value Ref Range    Heparin Unfractionated 0.51 0.30 - 0.70 IU/mL      Comment: (NOTE) If heparin results are below expected values, and patient dosage has  been confirmed, suggest follow up testing of antithrombin III levels. Performed at [facility], [address] [place], [place] [address]         Imaging Results (Last 48 hours)  CT ANGIO HEAD W OR WO CONTRAST   Result Date: 06/09/2019 CLINICAL DATA:  Follow-up examination for stroke. EXAM: CT ANGIOGRAPHY HEAD AND NECK TECHNIQUE: Multidetector CT imaging of the head and neck was performed using the standard protocol during bolus administration of intravenous contrast. Multiplanar CT image reconstructions and MIPs were obtained to evaluate the vascular anatomy. Carotid stenosis measurements (when applicable) are obtained utilizing NASCET criteria, using the distal internal carotid diameter as the denominator. CONTRAST:  80mL OMNIPAQUE IOHEXOL 350 MG/ML SOLN COMPARISON:  Prior CTA from 06/02/2019. FINDINGS: CT HEAD FINDINGS Brain: Normal expected interval evolution  of multifocal posterior circulation infarcts, relatively stable in size and morphology as compared  to previous exams. No evidence for hemorrhagic transformation or other complication. No significant mass effect. Additional chronic infarcts involving the right basal ganglia, right parietal lobe, and right cerebellum noted. No acute intracranial hemorrhage. No new large vessel territory infarct. No mass lesion or midline shift. No hydrocephalus or extra-axial fluid collection. Vascular: Hyperdensity within the proximal basilar, consistent with previously identified thrombus. Skull: Unremarkable. Sinuses: Paranasal sinuses and mastoid air cells remain largely clear. Orbits: Globes orbital soft tissues demonstrate no acute finding. Review of the MIP images confirms the above findings CTA NECK FINDINGS Aortic arch: Aortic arch not included on this exam. 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 Distal reconstitution at the origin of the right subclavian and right common carotid artery. Subclavian arteries otherwise patent with no new finding. Right carotid system: Right common and internal carotid arteries remain patent to the skull base without significant stenosis, dissection, or occlusion. Mild atheromatous irregularity about the right bifurcation without significant stenosis. Left carotid system: Left CCA widely patent proximally. Eccentric soft plaque within the mid left common carotid artery with relatively mild stenosis noted, unchanged. Left common and internal carotid arteries otherwise patent without significant stenosis, dissection, or occlusion. Vertebral arteries: Both vertebral arteries arise from the subclavian arteries. Soft plaque at the origin of the right vertebral artery with associated mild-to-moderate stenosis. Attenuated flow seen within the distal right V2 and V3 segments, which otherwise remain patent to the skull base. Minimal plaque at the origin of the left vertebral artery with no more than mild stenosis. Slightly dominant left vertebral artery otherwise widely patent within  the neck. Skeleton: Mild multilevel cervical spondylosis without significant stenosis. Other neck: No other new soft tissue abnormality within the neck. Subcentimeter hypodense right thyroid nodule noted, of doubtful significance given size and patient age. No follow-up imaging recommended regarding this lesion. Upper chest: Mild dependent atelectatic changes seen within the visualized lungs. Small amount of layering secretions noted within the subglottic airway. Visualized upper chest demonstrates no other acute abnormality. Review of the MIP images confirms the above findings CTA HEAD FINDINGS Anterior circulation: Internal carotid arteries remain widely patent to the termini without stenosis. A1 segments, anterior communicating artery common anterior cerebral arteries widely patent. No M1 stenosis. Negative MCA bifurcations. Distal MCA branches remain well perfused and symmetric. Posterior circulation: Vertebral arteries patent as they course into the cranial vault. Occlusive thrombus again seen within the distal V4 segments bilaterally, right greater than left, little interval change from previous. Left PICA remains patent. Right PICA not seen. Occlusive clot extends into the proximal and mid basilar artery, little interval change from previous. Neither AICA is well seen. Distal reconstitution at the distal aspect of the basilar artery. Superior cerebral arteries well perfused bilaterally. Both PCAs primarily supplied via the basilar, although small bilateral posterior communicating arteries are seen, left slightly larger than right. PCAs mildly irregular but remain widely patent and well perfused to their distal aspects. Venous sinuses: Grossly patent allowing for timing the contrast bolus. Anatomic variants: None significant. Review of the MIP images confirms the above findings IMPRESSION: CT HEAD IMPRESSION: 1. Normal expected interval evolution of scattered posterior circulation infarcts, grossly stable in  size and distribution as compared to previous. No evidence for hemorrhagic transformation or other complication. 2. No other new acute intracranial abnormality. CTA HEAD AND NECK IMPRESSION: 1. Stable CTA of the head and neck with persistent occlusive thrombus involving the distal  V4 segments as well as the proximal-mid basilar artery, little interval changed in appearance as compared to previous. Distal reconstitution at the distal basilar artery, likely via the anterior circulation via small bilateral posterior communicating arteries. Both PCAs and SCAs remain well perfused proximally. Right PICA not well visualized as before. 2. 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 3. Mild to moderate stenoses about the origins of the vertebral arteries bilaterally, right greater than left. 4. Otherwise wide patency of the major arterial vasculature of the head and neck. No other new or progressive finding. Electronically Signed   By: Rise Mu M.D.   On: 06/09/2019 03:11    CT ANGIO NECK W OR WO CONTRAST   Result Date: 06/09/2019 CLINICAL DATA:  Follow-up examination for stroke. EXAM: CT ANGIOGRAPHY HEAD AND NECK TECHNIQUE: Multidetector CT imaging of the head and neck was performed using the standard protocol during bolus administration of intravenous contrast. Multiplanar CT image reconstructions and MIPs were obtained to evaluate the vascular anatomy. Carotid stenosis measurements (when applicable) are obtained utilizing NASCET criteria, using the distal internal carotid diameter as the denominator. CONTRAST:  80mL OMNIPAQUE IOHEXOL 350 MG/ML SOLN COMPARISON:  Prior CTA from 06/02/2019. FINDINGS: CT HEAD FINDINGS Brain: Normal expected interval evolution of multifocal posterior circulation infarcts, relatively stable in size and morphology as compared to previous exams. No evidence for hemorrhagic transformation or other complication. No significant mass effect. Additional chronic infarcts  involving the right basal ganglia, right parietal lobe, and right cerebellum noted. No acute intracranial hemorrhage. No new large vessel territory infarct. No mass lesion or midline shift. No hydrocephalus or extra-axial fluid collection. Vascular: Hyperdensity within the proximal basilar, consistent with previously identified thrombus. Skull: Unremarkable. Sinuses: Paranasal sinuses and mastoid air cells remain largely clear. Orbits: Globes orbital soft tissues demonstrate no acute finding. Review of the MIP images confirms the above findings CTA NECK FINDINGS Aortic arch: Aortic arch not included on this exam. 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 Distal reconstitution at the origin of the right subclavian and right common carotid artery. Subclavian arteries otherwise patent with no new finding. Right carotid system: Right common and internal carotid arteries remain patent to the skull base without significant stenosis, dissection, or occlusion. Mild atheromatous irregularity about the right bifurcation without significant stenosis. Left carotid system: Left CCA widely patent proximally. Eccentric soft plaque within the mid left common carotid artery with relatively mild stenosis noted, unchanged. Left common and internal carotid arteries otherwise patent without significant stenosis, dissection, or occlusion. Vertebral arteries: Both vertebral arteries arise from the subclavian arteries. Soft plaque at the origin of the right vertebral artery with associated mild-to-moderate stenosis. Attenuated flow seen within the distal right V2 and V3 segments, which otherwise remain patent to the skull base. Minimal plaque at the origin of the left vertebral artery with no more than mild stenosis. Slightly dominant left vertebral artery otherwise widely patent within the neck. Skeleton: Mild multilevel cervical spondylosis without significant stenosis. Other neck: No other new soft tissue abnormality  within the neck. Subcentimeter hypodense right thyroid nodule noted, of doubtful significance given size and patient age. No follow-up imaging recommended regarding this lesion. Upper chest: Mild dependent atelectatic changes seen within the visualized lungs. Small amount of layering secretions noted within the subglottic airway. Visualized upper chest demonstrates no other acute abnormality. Review of the MIP images confirms the above findings CTA HEAD FINDINGS Anterior circulation: Internal carotid arteries remain widely patent to the termini without stenosis. A1  segments, anterior communicating artery common anterior cerebral arteries widely patent. No M1 stenosis. Negative MCA bifurcations. Distal MCA branches remain well perfused and symmetric. Posterior circulation: Vertebral arteries patent as they course into the cranial vault. Occlusive thrombus again seen within the distal V4 segments bilaterally, right greater than left, little interval change from previous. Left PICA remains patent. Right PICA not seen. Occlusive clot extends into the proximal and mid basilar artery, little interval change from previous. Neither AICA is well seen. Distal reconstitution at the distal aspect of the basilar artery. Superior cerebral arteries well perfused bilaterally. Both PCAs primarily supplied via the basilar, although small bilateral posterior communicating arteries are seen, left slightly larger than right. PCAs mildly irregular but remain widely patent and well perfused to their distal aspects. Venous sinuses: Grossly patent allowing for timing the contrast bolus. Anatomic variants: None significant. Review of the MIP images confirms the above findings IMPRESSION: CT HEAD IMPRESSION: 1. Normal expected interval evolution of scattered posterior circulation infarcts, grossly stable in size and distribution as compared to previous. No evidence for hemorrhagic transformation or other complication. 2. No other new acute  intracranial abnormality. CTA HEAD AND NECK IMPRESSION: 1. Stable CTA of the head and neck with persistent occlusive thrombus involving the distal V4 segments as well as the proximal-mid basilar artery, little interval changed in appearance as compared to previous. Distal reconstitution at the distal basilar artery, likely via the anterior circulation via small bilateral posterior communicating arteries. Both PCAs and SCAs remain well perfused proximally. Right PICA not well visualized as before. 2. 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 3. Mild to moderate stenoses about the origins of the vertebral arteries bilaterally, right greater than left. 4. Otherwise wide patency of the major arterial vasculature of the head and neck. No other new or progressive finding. Electronically Signed   By: Jeannine Boga M.D.   On: 06/09/2019 03:11    DG CHEST PORT 1 VIEW   Result Date: 06/08/2019 CLINICAL DATA:  54 year old male with leukocytosis. EXAM: PORTABLE CHEST 1 VIEW COMPARISON:  Chest radiograph dated 06/05/2019. FINDINGS: There is mild chronic interstitial coarsening and bronchitic changes. Bibasilar streaky densities likely atelectasis/scarring. No focal consolidation, pleural effusion or pneumothorax. The cardiac silhouette is within normal limits. No acute osseous pathology. IMPRESSION: No focal consolidation. Electronically Signed   By: Anner Crete M.D.   On: 06/08/2019 15:55             Medical Problem List and Plan: 1.  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  Antithrombotics: -DVT/anticoagulation:  Lovenox             -antiplatelet therapy: Aspirin 325 mg and Plavix 75 mg daily 3. Pain Management: Tramadol as needed 4. Mood: Wellbutrin 150 mg twice daily, may need trial of Nuedexta, expect PBA given extensive posterior > anterior circulation infarcts             -antipsychotic agents: Seroquel 25 mg 3 times daily as needed 5. Neuropsych: This patient is not capable of making decisions on his own behalf. 6. Skin/Wound Care: Routine skin checks  7. Fluids/Electrolytes/Nutrition: Routine in and outs with follow-up chemistries 8.  Dysphagia.  Dysphagia #1 honey thick liquids.  Follow-up speech therapy.  Monitor hydration- expect need for PEG poor prognosis for recovery in short term 9.  Orthostatic hypotension.  Monitor with increased mobility 10.  Hyperlipidemia.  Lipitor 11.  Tobacco abuse.  NicoDerm patch.  Counseling. 12.  Leukocytosis.  Chest x-ray negative.  Patient did receive 4 days prednisone since discontinued.  Follow-up CBC.      Mcarthur Rossettianiel J Angiulli, PA-C "I have personally performed a face to face diagnostic evaluation of this patient.  Additionally, I have reviewed and concur with the physician assistant's documentation above." Erick ColaceAndrew E. Akili Corsetti M.D. [facility] FAAPM&R (Neuromuscular Med) Diplomate Am Board of Electrodiagnostic Med Fellow Am Board of Interventional Pain

## 2019-06-12 NOTE — Plan of Care (Signed)
  Problem: Consults Goal: RH STROKE PATIENT EDUCATION Description: See Patient Education module for education specifics  Outcome: Progressing   Problem: RH BOWEL ELIMINATION Goal: RH STG MANAGE BOWEL WITH ASSISTANCE Description: STG Manage Bowel with mod Assistance. Outcome: Progressing Goal: RH STG MANAGE BOWEL W/MEDICATION W/ASSISTANCE Description: STG Manage Bowel with Medication with mod Assistance. Outcome: Progressing   Problem: RH BLADDER ELIMINATION Goal: RH STG MANAGE BLADDER WITH ASSISTANCE Description: STG Manage Bladder With mod Assistance Outcome: Progressing   Problem: RH SKIN INTEGRITY Goal: RH STG MAINTAIN SKIN INTEGRITY WITH ASSISTANCE Description: STG Maintain Skin Integrity With mod Assistance. Outcome: Progressing Goal: RH STG ABLE TO PERFORM INCISION/WOUND CARE W/ASSISTANCE Description: STG Able To Perform Incision/Wound Care With max Assistance. Outcome: Progressing   Problem: RH SAFETY Goal: RH STG ADHERE TO SAFETY PRECAUTIONS W/ASSISTANCE/DEVICE Description: STG Adhere to Safety Precautions With min Assistance/Device. Outcome: Progressing   Problem: RH COGNITION-NURSING Goal: RH STG USES MEMORY AIDS/STRATEGIES W/ASSIST TO PROBLEM SOLVE Description: STG Uses Memory Aids/Strategies With min Assistance to Problem Solve. Outcome: Progressing Goal: RH STG ANTICIPATES NEEDS/CALLS FOR ASSIST W/ASSIST/CUES Description: STG Anticipates Needs/Calls for Assist With min Assistance/Cues. Outcome: Progressing   Problem: RH PAIN MANAGEMENT Goal: RH STG PAIN MANAGED AT OR BELOW PT'S PAIN GOAL Description: Less than 3 out of 10 Outcome: Progressing   Problem: RH KNOWLEDGE DEFICIT Goal: RH STG INCREASE KNOWLEDGE OF HYPERTENSION Description: Patient/family will be able to identify medication to treat HTN along with 2 other nonpharmalogical interventions Outcome: Progressing Goal: RH STG INCREASE KNOWLEDGE OF DYSPHAGIA/FLUID INTAKE Description: Patient will be  able to demonstrate understanding of thickened fluid necessity Outcome: Progressing Goal: RH STG INCREASE KNOWLEDGE OF STROKE PROPHYLAXIS Description: Patient/Family will be able to verbalize to 3 medications for stroke prevention post discharge  Outcome: Progressing   

## 2019-06-12 NOTE — Progress Notes (Signed)
Patient arrived to unit via bed accompanied by son. Patient has difficulty speaking, son has been able to answer questions related to care. Plan of care and meds have been reviewed with patient and son. No complaints to report at this time. Leane Para, LPN

## 2019-06-13 ENCOUNTER — Inpatient Hospital Stay (HOSPITAL_COMMUNITY): Payer: Self-pay

## 2019-06-13 ENCOUNTER — Inpatient Hospital Stay (HOSPITAL_COMMUNITY): Payer: Medicaid Other

## 2019-06-13 ENCOUNTER — Inpatient Hospital Stay (HOSPITAL_COMMUNITY): Payer: Self-pay | Admitting: Speech Pathology

## 2019-06-13 ENCOUNTER — Inpatient Hospital Stay (HOSPITAL_COMMUNITY): Payer: Self-pay | Admitting: Occupational Therapy

## 2019-06-13 IMAGING — CR DG LUMBAR SPINE 2-3V
3 series · 3 of 3 positions shown · non-contrast
Comparison: None.

CLINICAL DATA: Low back pain

EXAM:
LUMBAR SPINE - 2-3 VIEW

[l-spine ap]
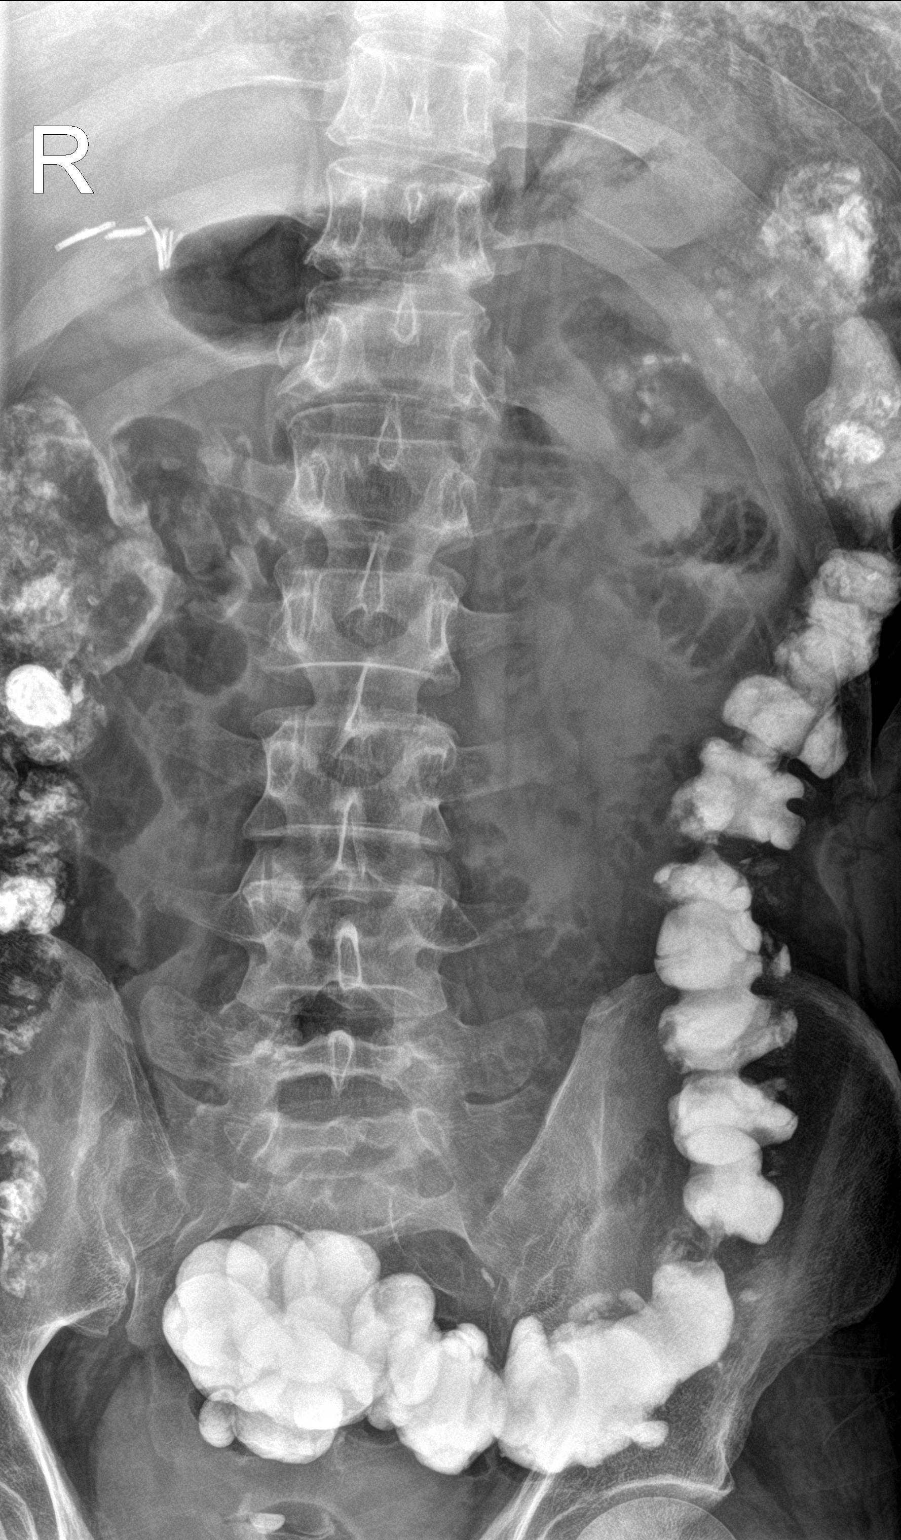

[l-spine lat]
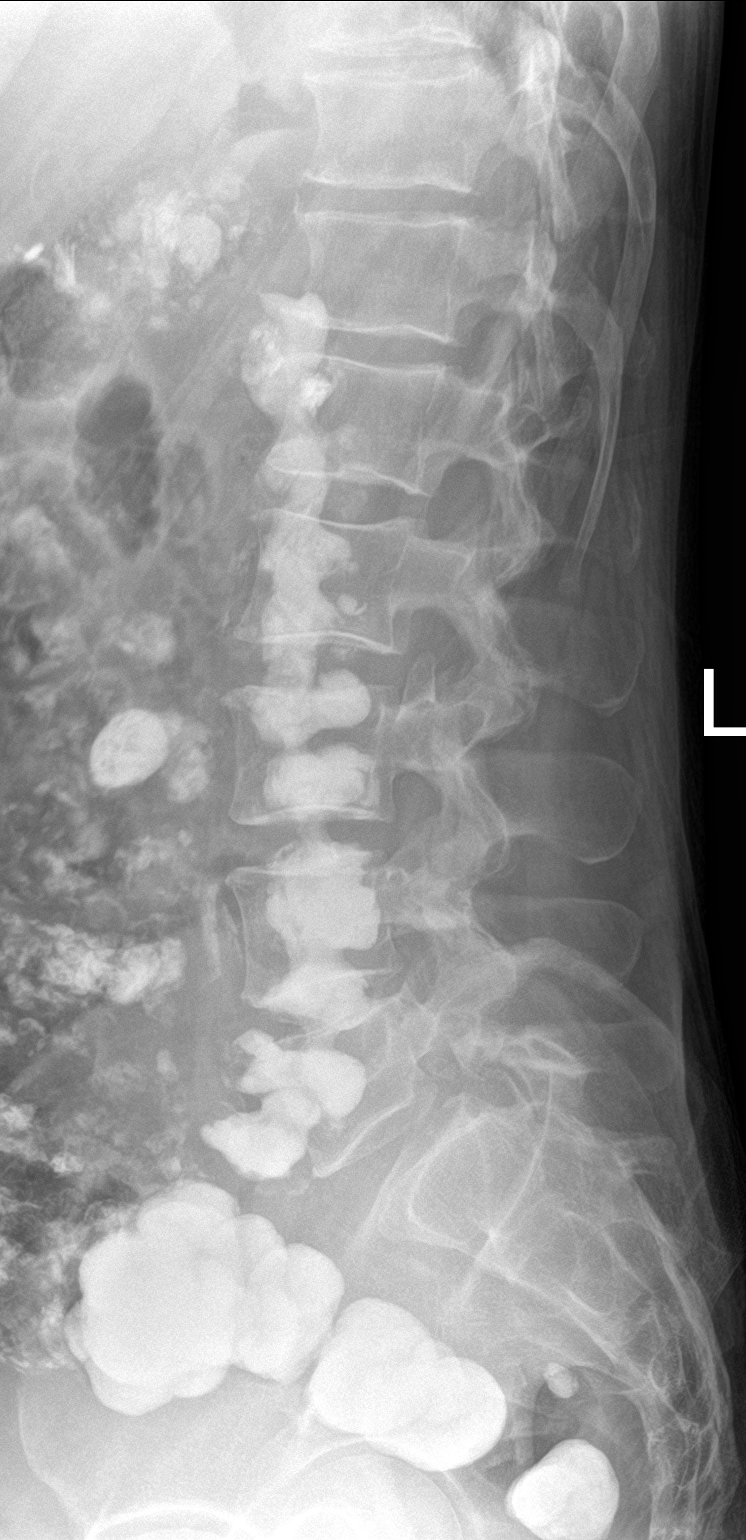

[l-spine spot]
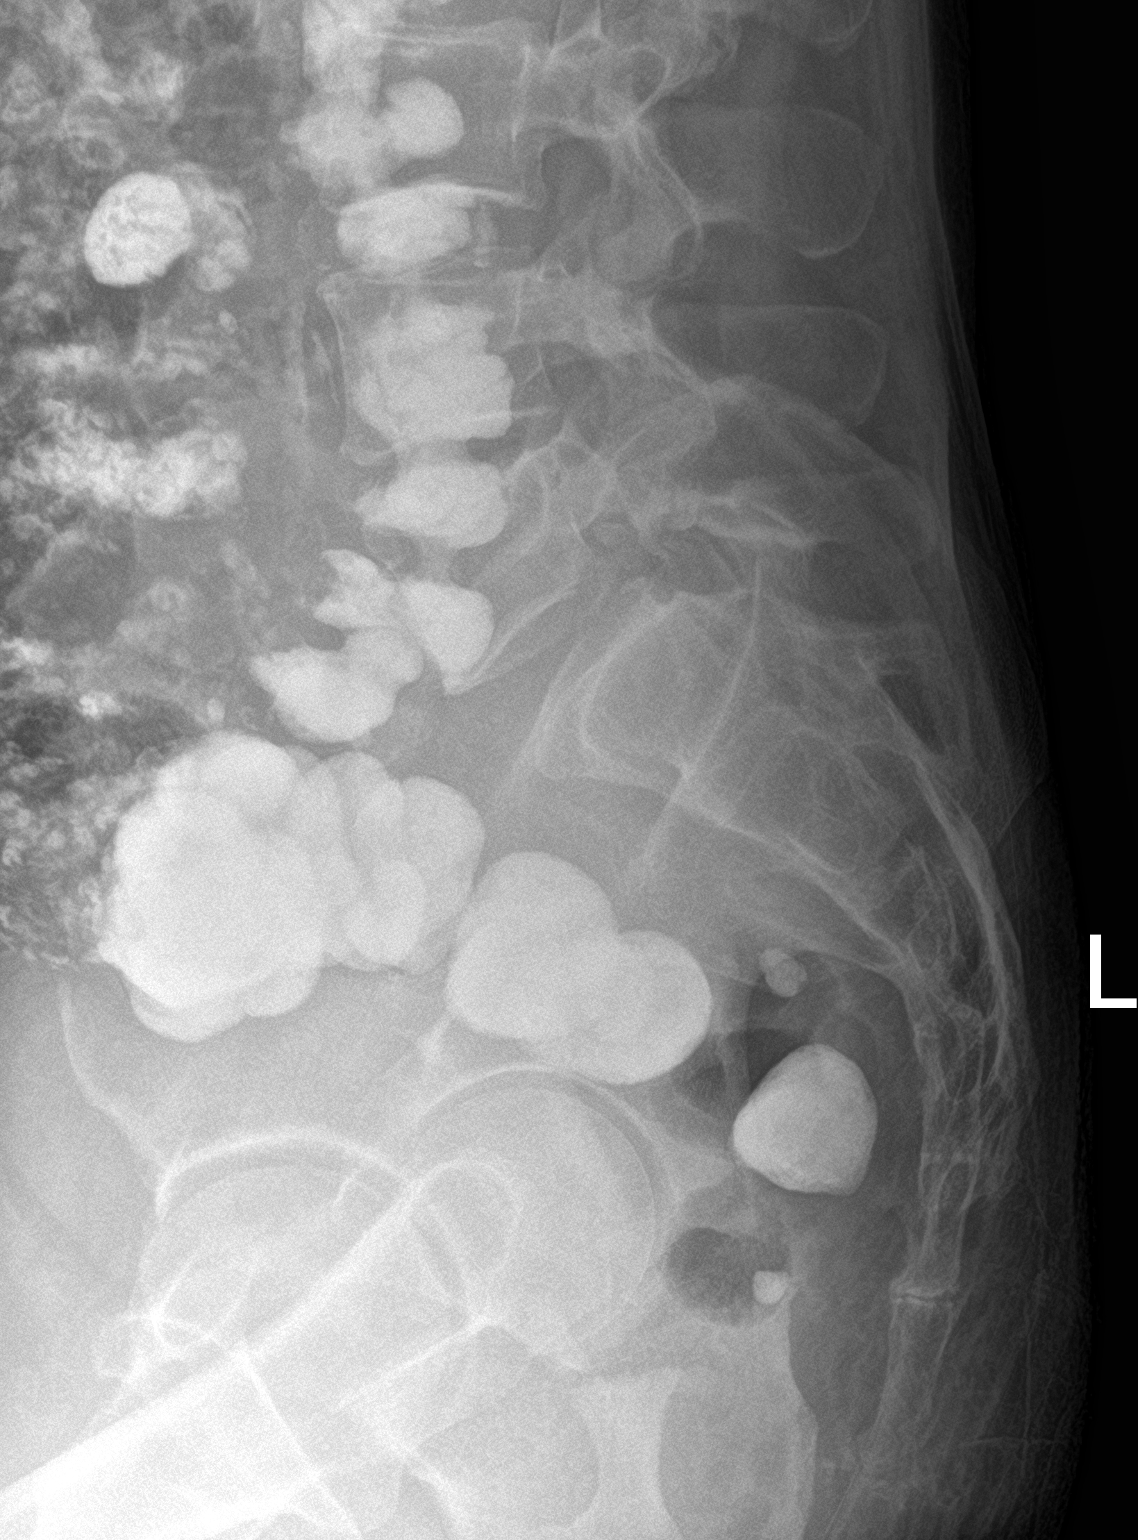

[3 of 3 positions shown; findings below may reference images not displayed]

FINDINGS: Colonic contrast is seen.  Colonic diverticuli.

Evaluation of the lumbar spine on the lateral view is obscured by
overlapping contrast filled colon. Within this limitation, no
traumatic malalignment or fracture seen. Mild multilevel
degenerative disc disease is noted.
IMPRESSION: 1. Contrast in the colon limits evaluation lumbar spine on the
lateral view. No fracture or traumatic malalignment seen within
these limitations. Mild degenerative changes.

## 2019-06-13 NOTE — Progress Notes (Signed)
[place] PHYSICAL MEDICINE & REHABILITATION PROGRESS NOTE   Subjective/Complaints:  Pt severely dysarthric, no issues overnite per RN   ROS- unable to eval sue to severe dysarthria, also not consistent wit hY/N head nods   Objective:   DG Chest 2 View  Result Date: 06/11/2019 CLINICAL DATA:  Congestion with coughing. EXAM: CHEST - 2 VIEW COMPARISON:  06/08/2019 FINDINGS: Low lung volumes. Cardiopericardial silhouette is at upper limits of normal for size. There is pulmonary vascular congestion without overt pulmonary edema. Interstitial markings are diffusely coarsened with chronic features. Streaky atelectasis or scarring at the left base is similar to prior. No dense airspace consolidation. No pleural effusion. The visualized bony structures of the thorax are intact. Telemetry leads overlie the chest. IMPRESSION: Low lung volumes with vascular congestion. No overt pulmonary edema or focal airspace consolidation. Streaky opacity at the left base is similar to prior and likely reflects atelectasis or scarring. Electronically Signed   By: Kennith Center M.D.   On: 06/11/2019 10:11   Recent Labs    06/12/19 0420  WBC 15.5*  HGB 14.4  HCT 43.6  PLT 284   Recent Labs    06/12/19 0420  NA 143  K 3.9  CL 108  CO2 24  GLUCOSE 108*  BUN 23*  CREATININE 0.80  CALCIUM 9.1    Intake/Output Summary (Last 24 hours) at 06/13/2019 0748 Last data filed at 06/13/2019 0600 Gross per 24 hour  Intake 768.61 ml  Output 550 ml  Net 218.61 ml     Physical Exam: Vital Signs Blood pressure (!) 141/86, pulse 92, temperature 98.3 F (36.8 C), resp. rate 18, height 5\' 11"  (1.803 m), weight 66.3 kg, SpO2 98 %.   General: No acute distress Mood and affect are appropriate Heart: Regular rate and rhythm no rubs murmurs or extra sounds Lungs: Clear to auscultation, breathing unlabored, no rales or wheezes Abdomen: Positive bowel sounds, soft nontender to palpation, nondistended Extremities: No  clubbing, cyanosis, or edema Skin: No evidence of breakdown, no evidence of rash Neurologic: Cranial nerves II through XII intact, motor strength is 0/5 in bilateral deltoid, bicep, tricep, grip, hip flexor, knee extensors, ankle dorsiflexor and plantar flexor except 2- RIght elbow flexion DTR 3+ bilateral knees, no clonus at ankle, increased tone in the hamstrings bilaterally  Sensory exam connot asess due to communication  Severe dysarthria Cerebellar exam unable to perform due to weakness Musculoskeletal: Full range of motion in all 4 extremities. No joint swelling   Assessment/Plan: 1. 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  Physiatrist is providing close team supervision and 24 hour management of active medical problems listed below.  Physiatrist and rehab team continue to assess barriers to discharge/monitor patient progress toward functional and medical goals  Care Tool:  Bathing        Body parts bathed by helper: Front perineal area, Buttocks     Bathing assist Assist Level: Dependent - Patient 0%     Upper Body Dressing/Undressing Upper body dressing   What is the patient wearing?: Hospital gown only    Upper body assist Assist Level: Dependent - Patient 0%    Lower Body Dressing/Undressing Lower body dressing      What is the patient wearing?: Incontinence brief     Lower body assist Assist for lower body dressing: Dependent - Patient 0%     Toileting Toileting  Toileting assist Assist for toileting: Dependent - Patient 0%     Transfers Chair/bed transfer  Transfers assist  Chair/bed transfer activity did not occur: Safety/medical concerns        Locomotion Ambulation   Ambulation assist   Ambulation activity did not occur: Safety/medical concerns          Walk 10 feet activity   Assist            Walk 50 feet activity   Assist           Walk 150 feet activity   Assist           Walk 10 feet on uneven surface  activity   Assist           Wheelchair     Assist               Wheelchair 50 feet with 2 turns activity    Assist            Wheelchair 150 feet activity     Assist          Blood pressure (!) 141/86, pulse 92, temperature 98.3 F (36.8 C), resp. rate 18, height 5\' 11"  (1.803 m), weight 66.3 kg, SpO2 98 %.  Medical Problem List and Plan: 1.Dysarthria/quadriparesissecondary to multifocal infarcts mostly posterior circulation but also bilateral anterior circulation cardioembolic pattern as well as innominate artery occlusion, left ICA thrombus versus soft plaque, right V4 and basal artery occlusive thrombus -patient may  shower -ELOS/Goals: 21-25d will need PEG, family training and equipment- functional prognosis guarded 2. Antithrombotics: -DVT/anticoagulation:Lovenox -antiplatelet therapy: Aspirin 325 mg and Plavix 75 mg daily 3. Pain Management:Tramadol as needed 4. Mood:Wellbutrin 150 mg twice daily, may need trial of Nuedexta, expect PBA given extensive posterior > anterior circulation infarcts -antipsychotic agents: Seroquel 25 mg 3 times daily as needed 5. Neuropsych: This patientis notcapable of making decisions on hisown behalf. 6. Skin/Wound Care:Routine skin checks 7. Fluids/Electrolytes/Nutrition:Routine in and outs with follow-up chemistries 8. Dysphagia. Dysphagia #1 honey thick liquids.  Monitor hydration- expect need for PEG poor prognosis for recovery in short term, SLP eval today  9. Orthostatic hypotension. Monitor with increased mobility 10. Hyperlipidemia. Lipitor 11. Tobacco abuse. NicoDerm patch. Counseling. 12. Leukocytosis. Chest x-ray negative. Patient did receive 4 days prednisone since discontinued. WBC  trending down repeat in am     LOS: 1 days A FACE TO FACE EVALUATION WAS PERFORMED  Charlett Blake 06/13/2019, 7:48 AM

## 2019-06-13 NOTE — Progress Notes (Signed)
Pt sitting in wheelchair, c/o back pain at this time, CPT held, family and pt aware. Will attempt later if no longer in pain

## 2019-06-13 NOTE — Evaluation (Signed)
Occupational Therapy Assessment and Plan  Patient Details  Name: Adrian [name] MRN: [id number] Date of Birth: 1965-05-07  OT Diagnosis: hemiplegia affecting dominant side and hemiplegia affecting non-dominant side Rehab Potential: Rehab Potential (ACUTE ONLY): Fair ELOS: 4 weeks   Today's Date: 06/13/2019 OT Individual Time: 1300-1400 OT Individual Time Calculation (min): 60 min     Problem List:  Patient Active Problem List   Diagnosis Date Noted  . Cardioembolic stroke ([place]) 35/57/3220  . Chronic right-sided low back pain with right-sided sciatica   . Tobacco abuse   . Leukocytosis   . Prediabetes   . CVA (cerebral vascular accident) ([place]) 06/02/2019  . 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 Tobacco abuse    Past Surgical History: No past surgical history on file.  Assessment & Plan Clinical Impression: Patient is a 54 y.o. year old male with history of tobacco abuse and chronic low back pain maintained on Neurontin 600 mg 3 times dailyas well as prednisone pack.. History taken from chart review wife and son due to patient speech and cognition. Independent prior to admission. Presented 06/02/2019 with dysarthria andquadriparesis. Admission chemistries with WBC 21,000, BUN 24, urine drug screen negative. CT showed multiple bilateral infarcts. Per report numerous small supratentorial and infratentorial acute infarcts involving the cerebral hemispheres, left thalamus, pons and right cerebellar hemisphere. Chronic right frontoparietal cortical infarcts. Follow-up MRI showed slight increase in size of bilateral acute infarcts of the pons. Unchanged infarcts of the left thalamus left caudate nucleus, right cerebellum and right frontal white matter. No hemorrhage or mass-effect.. Patient did not receive TPA. CT angiogram of head and neck 06/02/2019 occlusive thrombus within the distal V4 right vertebral artery with occlusive  thrombus extending into the proximal and mid basilar artery. Occlusive thrombus also suspected within the very distal V4 left vertebral artery. Echocardiogram with ejection fraction of 25% grade 1 diastolic dysfunction. EEG negative for seizure.CTA head and neck repeated 06/09/2019 unchanged multivessel occlusion. Recommendations are DAPT of aspirin 325 mg and Plavix 75 mg daily. Patient had initially been maintained on intravenous heparin which is since been discontinued and currently maintained on Lovenox. Dysphagia #1 honey thick liquid diet monitoring of hydration and still with ongoing secretion management. Patient very labile as well as anxious and placed on Wellbutrin as well as the addition of Seroquel as needed. Bouts of orthostasis initially and did require fluid resuscitation. His Levophed has been discontinued. Noted leukocytosis as patient had been on initial prednisone x4 days prior to admission for chronic back painsince discontinued latest WBC 19,400 and latest chest x-ray no focal consolidation.  Patient transferred to CIR on 06/12/2019 .    Patient currently requires total with basic self-care skills and IADL secondary to abnormal tone, unbalanced muscle activation and decreased coordination and decreased sitting balance, decreased standing balance, decreased postural control, hemiplegia, decreased balance strategies and difficulty maintaining precautions.  Prior to hospitalization, patient could complete ADL with independent .  Patient will benefit from skilled intervention to decrease level of assist with basic self-care skills, increase independence with basic self-care skills and increase level of independence with iADL prior to discharge home with care partner.  Anticipate patient will require moderate physical assestance and follow up home health.  OT - End of Session Activity Tolerance: Tolerates 10 - 20 min activity with multiple rests Endurance Deficit: Yes OT  Assessment Rehab Potential (ACUTE ONLY): Fair OT Patient demonstrates impairments in the following area(s):  Balance;Endurance;Motor;Pain;Perception;Safety;Sensory;Skin Integrity OT Basic ADL's Functional Problem(s): Eating;Grooming;Bathing;Dressing;Toileting OT Transfers Functional Problem(s): Toilet;Tub/Shower OT Additional Impairment(s): Fuctional Use of Upper Extremity OT Plan OT Intensity: Minimum of 1-2 x/day, 45 to 90 minutes OT Frequency: 5 out of 7 days OT Duration/Estimated Length of Stay: 4 weeks OT Treatment/Interventions: Balance/vestibular training;Neuromuscular re-education;Self Care/advanced ADL retraining;Therapeutic Exercise;Wheelchair propulsion/positioning;DME/adaptive equipment instruction;Pain management;Skin care/wound managment;UE/LE Strength taining/ROM;Community reintegration;Patient/family education;Splinting/orthotics;UE/LE Coordination activities;Discharge planning;Functional mobility training;Psychosocial support;Therapeutic Activities OT Self Feeding Anticipated Outcome(s): min A OT Basic Self-Care Anticipated Outcome(s): mod A OT Toileting Anticipated Outcome(s): mod A OT Bathroom Transfers Anticipated Outcome(s): min/mod A OT Recommendation Recommendations for Other Services: Neuropsych consult Patient destination: Home Follow Up Recommendations: Home health OT Equipment Recommended: To be determined Equipment Details: to be determined pending progress   Skilled Therapeutic Intervention Patient in bed, alert - wife and son present for session.  Evaluation completed as documented below.  He is cooperative and follows basic commands as able.  he presents with flat affect and severe dysarthria limiting expressive speech, severe motor deficit with inability to use right or left UE/LE for functional task completion, head/trunk weakness limiting bed mobility and sitting balance.   He is able to read and respond Y/N for orientation and memory questions with multiple  choice options.  Visual abilities appear intact but need further monitoring.  He labile at times but does laugh appropriately.  Reviewed role of OT, plan of care and goals for therapy program with patient and family - they express understanding at this time.   Assisted patient with eating, use of bed pan and changing soiled brief, oral care and washing - attempted hand over hand - may benefit from bath mitt.  Reviewed positioning, weight shift/skin care and rolling techniques.  Patient placed in side lying at close of session with HOB>30, family present.    OT Evaluation Precautions/Restrictions  Precautions Precautions: Fall;Other (comment) Precaution Comments: check BP on left arm due to occlusion on R side. Restrictions Weight Bearing Restrictions: No General   Vital Signs Therapy Vitals Temp: 98.2 F (36.8 C) Temp Source: Oral Pulse Rate: 98 Resp: 16 BP: (!) 133/96 Patient Position (if appropriate): Sitting Oxygen Therapy SpO2: 97 % O2 Device: Room Air Pain Pain Assessment Pain Scale: PAINAD PAINAD (Pain Assessment in Advanced Dementia) Breathing: normal Negative Vocalization: none Facial Expression: smiling or inexpressive Body Language: relaxed Consolability: no need to console PAINAD Score: 0 Home Living/Prior Functioning Home Living Available Help at Discharge: Family Type of Home: House  Lives With: Family Prior Function Level of Independence: Independent with gait, Independent with basic ADLs, Independent with transfers, Independent with homemaking with ambulation Vocation: Full time employment 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 ADL Eating: Dependent Where Assessed-Eating: Bed level Grooming: Dependent Where Assessed-Grooming: Bed level Upper Body Bathing: Dependent Where Assessed-Upper Body Bathing: Bed level Lower Body Bathing: Dependent Where Assessed-Lower Body Bathing: Bed level Upper Body Dressing: Dependent Where  Assessed-Upper Body Dressing: Bed level Lower Body Dressing: Dependent Where Assessed-Lower Body Dressing: Bed level Toileting: Dependent Where Assessed-Toileting: Bed level Vision Baseline Vision/History: Wears glasses Wears Glasses: Reading only Patient Visual Report: Blurring of vision Vision Assessment?: Yes Eye Alignment: Within Functional Limits Ocular Range of Motion: Within Functional Limits Alignment/Gaze Preference: Within Defined Limits Tracking/Visual Pursuits: Able to track stimulus in all quads without difficulty Saccades: Within functional limits Convergence: Within functional limits Visual Fields: No apparent deficits Additional Comments: patient able to follow directions for assessment but having difficulty with expression limiting ability to complete all  aspects of visual assessment - ongoing monitoring needed Perception  Perception: (onoging assessment needed) Praxis   Cognition Overall Cognitive Status: Impaired/Different from baseline Arousal/Alertness: Awake/alert Orientation Level: Person Year: 2021(provided written choice of 2) Month: April Day of Week: Correct(provided with written choices) Memory: (difficult to assess at this time due to speech) Immediate Memory Recall: Sock;Blue;Bed Memory Recall Sock: Without Cue Memory Recall Blue: Without Cue Memory Recall Bed: Without Cue Attention: Sustained;Focused Focused Attention: Impaired Focused Attention Impairment: Verbal basic;Functional basic Sustained Attention: Impaired Sustained Attention Impairment: Functional basic;Verbal basic Awareness: Impaired Awareness Impairment: Emergent impairment Problem Solving: Impaired Problem Solving Impairment: Verbal basic;Functional basic Executive Function: (all impaired due to lower level deficits) Behaviors: Lability Safety/Judgment: Impaired Comments: patient with severe dysarthria limiting expression - following directions accurately and responding Y/N  appropriately t/o session Sensation Sensation Additional Comments: unable to formally assess due to cognitive deficits, responds to painful stimuli on all extremities. Nods "yes" when asked about light touch Coordination Gross Motor Movements are Fluid and Coordinated: No Fine Motor Movements are Fluid and Coordinated: No Finger Nose Finger Test: unable 9 Hole Peg Test: unable Motor  Motor Motor: Abnormal tone;Hemiplegia;Abnormal postural alignment and control Motor - Skilled Clinical Observations: quadraparesis, some active movement R side>L side, increased tone L side>R side Mobility  Bed Mobility Rolling Right: Total Assistance - Patient < 25% Rolling Left: Total Assistance - Patient < 25%  Trunk/Postural Assessment  Postural Control Postural Control: Deficits on evaluation Trunk Control: poor Righting Reactions: impaired  Balance Static Sitting Balance Static Sitting - Level of Assistance: 2: Max assist;1: +1 Total assist Dynamic Sitting Balance Dynamic Sitting - Level of Assistance: 1: +2 Total assist Extremity/Trunk Assessment RUE Assessment Passive Range of Motion (PROM) Comments: WFL with stretching/inhibiliton Active Range of Motion (AROM) Comments: trace elbow both flex/ext, weak grasp and able to extend digits approx 50% LUE Assessment Passive Range of Motion (PROM) Comments: WFL - moderate flexor tightness and need for inhibition/ prolonged stretch Active Range of Motion (AROM) Comments: trace elbow, able to flex digits volitionally unable to extend     Refer to Care Plan for Long Term Goals  Recommendations for other services: Neuropsych   Discharge Criteria: Patient will be discharged from OT if patient refuses treatment 3 consecutive times without medical reason, if treatment goals not met, if there is a change in medical status, if patient makes no progress towards goals or if patient is discharged from hospital.  The above assessment, treatment plan,  treatment alternatives and goals were discussed and mutually agreed upon: by patient and by family  Carlos Levering 06/13/2019, 4:36 PM

## 2019-06-13 NOTE — Evaluation (Signed)
Speech Language Pathology Assessment and Plan  Patient Details  Name: Adrian [name] MRN: [id number] Date of Birth: [birth year]  SLP Diagnosis: Dysarthria;Cognitive Impairments;Speech and Language deficits;Dysphagia  Rehab Potential: Fair ELOS: ~4 weeks    Today's Date: 06/13/2019 SLP Individual Time: 1100-1200 SLP Individual Time Calculation (min): 60 min   Problem List:  Patient Active Problem List   Diagnosis Date Noted  . Cardioembolic stroke ([place]) 76/19/5093  . Chronic right-sided low back pain with right-sided sciatica   . Tobacco abuse   . Leukocytosis   . Prediabetes   . CVA (cerebral vascular accident) ([place]) 06/02/2019  . 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 Tobacco abuse    Past Surgical History: No past surgical history on file.  Assessment / Plan / Recommendation Clinical Impression   Adrian [name] is a 54 year old right-handed male with history of tobacco abuse and chronic low back pain maintained on Neurontin 600 mg 3 times dailyas well as prednisone pack.. History taken from chart review wife and son due to patient speech and cognition. Independent prior to admission. Presented 06/02/2019 with dysarthria andquadriparesis. Admission chemistries with WBC 21,000, BUN 24, urine drug screen negative. CT showed multiple bilateral infarcts. Per report numerous small supratentorial and infratentorial acute infarcts involving the cerebral hemispheres, left thalamus, pons and right cerebellar hemisphere. Chronic right frontoparietal cortical infarcts. Follow-up MRI showed slight increase in size of bilateral acute infarcts of the pons. Unchanged infarcts of the left thalamus left caudate nucleus, right cerebellum and right frontal white matter. No hemorrhage or mass-effect.. Patient did not receive TPA. 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 Occlusive thrombus also suspected within the very distal V4 left vertebral artery. Echocardiogram with ejection fraction of 09% grade 1 diastolic dysfunction. EEG negative for seizure.CTA head and neck repeated 06/09/2019 unchanged multivessel occlusion. Recommendations are DAPT of aspirin 325 mg and Plavix 75 mg daily. Patient had initially been maintained on intravenous heparin which is since been discontinued and currently maintained on Lovenox. Dysphagia #1 honey thick liquid diet monitoring of hydration and still with ongoing secretion management. Patient very labile as well as anxious and placed on Wellbutrin as well as the addition of Seroquel as needed. Bouts of orthostasis initially and did require fluid resuscitation. His Levophed has been discontinued. Noted leukocytosis as patient had been on initial prednisone x4 days prior to admission for chronic back painsince discontinued latest WBC 19,400 and latest chest x-ray no focal consolidation.Therapy evaluations completed and patient was admitted for a comprehensive rehab program 06/12/19 and SLP evaluations were completed 06/13/19 with results as follows:  Bedside swallow evaluation: Pt with moderately severe primarily oral phase dysphagia characterized by weak lingual manipulation, intermittently poor bolus awareness, delayed AP transit, and oral residue of both pureed solids and honey thick liquids. Due to severity of deficits with purees, no advanced solid trials were attempted as it is not appropriate at this time. Pt has very limited lingual and mandibular range of motion throughout PO intake and oral mech exam. Pt's family reports pt's volume of PO intake is very limited at this time and suspect even if appetite increases due to severity of oral phase impairments, pt will likely require weeks of treatment to work toward solid advancement and may need alternative means  of nutrition in the meantime. Would recommend continue current  Dysphagia 1 (puree) texture diet, honey thick liquids via teaspoon, medications crushed in purees, full supervision and assistance for self-feeding.   Cognitive-linguistic evaluation: Pt with severe dysarthria impacting his functional communication. His speech intelligibility was ~10% at the word level to this unfamiliar listener, requiring mostly gestures and reforming of questions into yes/no format in order to communicate basic wants and needs. Pt's speech is characterized by mainly vowel sounds with very little physical contact/ROM of articulatory musculature. Pt does have a picture communication and letter board at bedside, however with very limited ability to move upper extremities, it is not functional for him at this time but will continue to assess. Pt's family also noted pt with limited education and impaired spelling abilities at baseline. Although verbal output was very limited, pt's receptive language appeared in tact as he was able to follow basic directions and yes/no questions via head shake/nod. Expressive and receptive language abilities believed to be in tact at this time, with primary speech impairment impacting communication being dysarthria. Pt also required Max-Total assistance for initiation, focused and sustained attention during functional tasks, as well as for basic problem solving throughout evaluation. He was oriented to self, place, and situation, however disoriented to time.  Recommend pt receive skilled ST services while inpatient to address dysphagia, dysarthria, and cognitive impairments outlined above in order to maximize diet safety, efficiency, and functional independence prior to discharge.   Skilled Therapeutic Interventions          Bedside swallow and cognitive-linguistic evaluations were administered and results were reviewed with pt, and his wife and son (please see above for details regarding results).     SLP Assessment  Patient will need skilled Speech Lanaguage Pathology Services during CIR admission    Recommendations  SLP Diet Recommendations: Dysphagia 1 (Puree);Honey;Alternative means - long-term Liquid Administration via: Spoon Medication Administration: Crushed with puree Supervision: Staff to assist with self feeding;Full supervision/cueing for compensatory strategies Compensations: Minimize environmental distractions;Slow rate;Small sips/bites;Other (Comment)(check or oral clearance/assist) Postural Changes and/or Swallow Maneuvers: Seated upright 90 degrees;Upright 30-60 min after meal Oral Care Recommendations: Oral care BID Patient destination: Home Follow up Recommendations: Home Health SLP;24 hour supervision/assistance Equipment Recommended: None recommended by SLP    SLP Frequency 3 to 5 out of 7 days   SLP Duration  SLP Intensity  SLP Treatment/Interventions ~4 weeks  Minumum of 1-2 x/day, 30 to 90 minutes  Cognitive remediation/compensation;Cueing hierarchy;Dysphagia/aspiration precaution training;Functional tasks;Patient/family education;Environmental controls;Internal/external aids;Speech/Language facilitation;Multimodal communication approach    Pain Pain Assessment Pain Scale: Faces Faces Pain Scale: No hurt  Prior Functioning Type of Home: House  Lives With: Family Available Help at Discharge: Family Vocation: Full time employment  SLP Evaluation Cognition Overall Cognitive Status: Impaired/Different from baseline Arousal/Alertness: Lethargic Orientation Level: Oriented to person;Oriented to situation;Disoriented to place Attention: Sustained;Focused Focused Attention: Impaired Focused Attention Impairment: Verbal basic;Functional basic Sustained Attention: Impaired Sustained Attention Impairment: Functional basic;Verbal basic Memory: (difficult to assess at this time due to speech) Awareness: Impaired Awareness Impairment: Emergent  impairment Problem Solving: Impaired Problem Solving Impairment: Verbal basic;Functional basic Executive Function: (all impaired due to lower level deficits) Behaviors: Lability Safety/Judgment: Impaired  Comprehension Auditory Comprehension Overall Auditory Comprehension: Appears within functional limits for tasks assessed Yes/No Questions: Within Functional Limits Commands: Within Functional Limits Conversation: Simple EffectiveTechniques: Extra processing time Visual Recognition/Discrimination Discrimination: Not tested Reading Comprehension Reading Status: Within funtional limits Expression Expression Primary Mode of Expression: Verbal Verbal Expression Overall Verbal Expression: Appears within functional limits for tasks assessed Initiation: Impaired Level  of Generative/Spontaneous Verbalization: Phrase Repetition: No impairment Naming: No impairment Interfering Components: Speech intelligibility Non-Verbal Means of Communication: Gestures;Eye gaze Written Expression Written Expression: Not tested Oral Motor Oral Motor/Sensory Function Overall Oral Motor/Sensory Function: Severe impairment Facial ROM: Reduced right;Reduced left Facial Symmetry: Abnormal symmetry right;Abnormal symmetry left Facial Strength: Reduced right;Reduced left Lingual ROM: Reduced right;Suspected CN XII (hypoglossal) dysfunction Lingual Symmetry: Within Functional Limits Lingual Strength: Reduced Velum: Suspected CN X (Vagus) dysfunction Mandible: Other (Comment)(limited ROM) Motor Speech Overall Motor Speech: Impaired Respiration: Within functional limits Phonation: Low vocal intensity;Wet Resonance: Hypernasality Articulation: Impaired Level of Impairment: Word Intelligibility: Intelligibility reduced Word: 0-24% accurate Phrase: 0-24% accurate Sentence: 0-24% accurate Conversation: Not tested Motor Planning: Witnin functional limits Motor Speech Errors: Not applicable Effective  Techniques: Pause;Over-articulate;Increased vocal intensity   Intelligibility: Intelligibility reduced Word: 0-24% accurate Phrase: 0-24% accurate Sentence: 0-24% accurate Conversation: Not tested  Bedside Swallowing Assessment General Previous Swallow Assessment: MBS 4/16 and 4/21 Diet Prior to this Study: Dysphagia 1 (puree);Honey-thick liquids Temperature Spikes Noted: N/A Respiratory Status: Room air History of Recent Intubation: No Behavior/Cognition: Cooperative;Requires cueing;Lethargic/Drowsy Oral Cavity - Dentition: Poor condition Self-Feeding Abilities: Needs assist Vision: Functional for self-feeding Patient Positioning: Upright in bed Baseline Vocal Quality: Low vocal intensity Volitional Cough: Strong Volitional Swallow: Able to elicit  Oral Care Assessment Does patient have any of the following "high(er) risk" factors?: None of the above Does patient have any of the following "at risk" factors?: Diet - patient on thickened liquids;Other - dysphagia Patient is AT RISK: Order set for Adult Oral Care Protocol initiated -  "At Risk Patients" option selected (see row information) Ice Chips Ice chips: Not tested Thin Liquid Thin Liquid: Not tested Nectar Thick Nectar Thick Liquid: Not tested Honey Thick Honey Thick Liquid: Impaired Presentation: Spoon;Cup Oral Phase Impairments: Reduced labial seal;Reduced lingual movement/coordination;Poor awareness of bolus Oral Phase Functional Implications: Right anterior spillage;Left anterior spillage;Oral residue;Prolonged oral transit Puree Puree: Impaired Presentation: Spoon Oral Phase Impairments: Reduced lingual movement/coordination;Poor awareness of bolus Oral Phase Functional Implications: Left anterior spillage;Oral residue;Prolonged oral transit Pharyngeal Phase Impairments: Multiple swallows Solid Solid: Not tested BSE Assessment Risk for Aspiration Impact on safety and function: Moderate aspiration  risk  Short Term Goals: Week 1: SLP Short Term Goal 1 (Week 1): Pt will demonstrated efficient mastication and oral clearance of puree solids with Max A multimodal cueing for use of compensaotry swallow strategies. SLP Short Term Goal 2 (Week 1): Pt will consume therapeutic trials of ice and/or thin H2O with adequate oral containment and minimal overt s/sx aspiration with Max A multimodal cues for accurate use of chin tuck maneuver. SLP Short Term Goal 3 (Week 1): Pt communicate basic wants and needs via multimodal means with Max A cues. SLP Short Term Goal 4 (Week 1): Pt will verbalize at the word level to achieve 25% intelligibility with Max A multimodal cueing for intelligibility strategies. SLP Short Term Goal 5 (Week 1): Pt will sustain attention to functional tasks for 3-5 minutes with Max A multimodal cues. SLP Short Term Goal 6 (Week 1): Pt will demonstrate ability to initiate and problem solve basic familiar tasks with Max A multimodal cueing.  Refer to Care Plan for Long Term Goals  Recommendations for other services: None   Discharge Criteria: Patient will be discharged from SLP if patient refuses treatment 3 consecutive times without medical reason, if treatment goals not met, if there is a change in medical status, if patient makes no progress towards goals or if patient is discharged  from hospital.  The above assessment, treatment plan, treatment alternatives and goals were discussed and mutually agreed upon: by patient  Arbutus Leas 06/13/2019, 12:45 PM

## 2019-06-13 NOTE — Plan of Care (Signed)
  Problem: Consults Goal: RH STROKE PATIENT EDUCATION Description: See Patient Education module for education specifics  Outcome: Progressing   Problem: RH BOWEL ELIMINATION Goal: RH STG MANAGE BOWEL WITH ASSISTANCE Description: STG Manage Bowel with mod Assistance. Outcome: Progressing Goal: RH STG MANAGE BOWEL W/MEDICATION W/ASSISTANCE Description: STG Manage Bowel with Medication with mod Assistance. Outcome: Progressing   Problem: RH BLADDER ELIMINATION Goal: RH STG MANAGE BLADDER WITH ASSISTANCE Description: STG Manage Bladder With mod Assistance Outcome: Progressing   Problem: RH SKIN INTEGRITY Goal: RH STG SKIN FREE OF INFECTION/BREAKDOWN Outcome: Progressing Goal: RH STG MAINTAIN SKIN INTEGRITY WITH ASSISTANCE Description: STG Maintain Skin Integrity With mod Assistance. Outcome: Progressing Goal: RH STG ABLE TO PERFORM INCISION/WOUND CARE W/ASSISTANCE Description: STG Able To Perform Incision/Wound Care With max Assistance. Outcome: Progressing   Problem: RH SAFETY Goal: RH STG ADHERE TO SAFETY PRECAUTIONS W/ASSISTANCE/DEVICE Description: STG Adhere to Safety Precautions With min Assistance/Device. Outcome: Progressing   Problem: RH COGNITION-NURSING Goal: RH STG USES MEMORY AIDS/STRATEGIES W/ASSIST TO PROBLEM SOLVE Description: STG Uses Memory Aids/Strategies With min Assistance to Problem Solve. Outcome: Progressing Goal: RH STG ANTICIPATES NEEDS/CALLS FOR ASSIST W/ASSIST/CUES Description: STG Anticipates Needs/Calls for Assist With min Assistance/Cues. Outcome: Progressing   Problem: RH PAIN MANAGEMENT Goal: RH STG PAIN MANAGED AT OR BELOW PT'S PAIN GOAL Description: Less than 3 out of 10 Outcome: Progressing   Problem: RH KNOWLEDGE DEFICIT Goal: RH STG INCREASE KNOWLEDGE OF HYPERTENSION Description: Patient/family will be able to identify medication to treat HTN along with 2 other nonpharmalogical interventions Outcome: Progressing Goal: RH STG INCREASE  KNOWLEDGE OF DYSPHAGIA/FLUID INTAKE Description: Patient will be able to demonstrate understanding of thickened fluid necessity Outcome: Progressing Goal: RH STG INCREASE KNOWLEDGE OF STROKE PROPHYLAXIS Description: Patient/Family will be able to verbalize to 3 medications for stroke prevention post discharge  Outcome: Progressing

## 2019-06-13 NOTE — Progress Notes (Signed)
Unable to complete CPT at this time as pt was working with therapy services

## 2019-06-13 NOTE — Evaluation (Signed)
Physical Therapy Assessment and Plan  Patient Details  Name: Adrian [name] MRN: [id number] Date of Birth: [birth year]  PT Diagnosis: Abnormal posture, Difficulty walking, Hemiparesis dominant, Hypertonia, Impaired cognition and Muscle weakness Rehab Potential: Good ELOS: 4 weeks   Today's Date: 06/13/2019 PT Individual Time: 0160-1093 PT Individual Time Calculation (min): 72 min     Problem List:  Patient Active Problem List   Diagnosis Date Noted   Cardioembolic stroke ([place]) 23/55/7322   Chronic right-sided low back pain with right-sided sciatica    Tobacco abuse    Leukocytosis    Prediabetes    CVA (cerebral vascular accident) ([place]) 06/02/2019   Basilar artery occlusion 06/02/2019    Past Medical History:  Past Medical History:  Diagnosis Date   Tobacco abuse    Past Surgical History: No past surgical history on file.  Assessment & Plan Clinical Impression: Patient is a 54 y.o. year old male with history of tobacco abuse and chronic low back pain maintained on Neurontin 600 mg 3 times daily as well as prednisone pack.. History taken from chart review wife and son due to patient speech and cognition. Independent prior to admission. Presented 06/02/2019 with dysarthria and quadriparesis. Admission chemistries with WBC 21,000, BUN 24, urine drug screen negative. CT showed multiple bilateral infarcts. Per report numerous small supratentorial and infratentorial acute infarcts involving the cerebral hemispheres, left thalamus, pons and right cerebellar hemisphere. Chronic right frontoparietal cortical infarcts. Follow-up MRI showed slight increase in size of bilateral acute infarcts of the pons. Unchanged infarcts of the left thalamus left caudate nucleus, right cerebellum and right frontal white matter. No hemorrhage or mass-effect.. Patient did not receive TPA. CT angiogram of head and neck 06/02/2019 occlusive thrombus within the distal V4 right vertebral artery with occlusive thrombus  extending into the proximal and mid basilar artery. Occlusive thrombus also suspected within the very distal V4 left vertebral artery. Echocardiogram with ejection fraction of 02% grade 1 diastolic dysfunction. EEG negative for seizure.  CTA head and neck repeated 06/09/2019 unchanged multivessel occlusion.  Recommendations are DAPT of aspirin 325 mg and Plavix 75 mg daily.  Patient had initially been maintained on intravenous heparin which is since been discontinued and currently maintained on Lovenox.  Dysphagia #1 honey thick liquid diet monitoring of hydration and still with ongoing secretion management.  Patient very labile as well as anxious and placed on Wellbutrin as well as the addition of Seroquel as needed.  Bouts of orthostasis initially and did require fluid resuscitation.  His Levophed has been discontinued.  Noted leukocytosis as patient had been on initial prednisone x4 days prior to admission for chronic back pain since discontinued latest WBC 19,400 and latest chest x-ray no focal consolidation.  Therapy evaluations completed and patient was admitted for a comprehensive rehab program. Patient transferred to CIR on 06/12/2019 .   Patient currently requires total with mobility secondary to muscle weakness and muscle joint tightness, decreased cardiorespiratoy endurance, abnormal tone, unbalanced muscle activation and decreased coordination, decreased motor planning, decreased initiation and decreased sitting balance, decreased standing balance, decreased postural control, hemiplegia and decreased balance strategies.  Prior to hospitalization, patient was independent  with mobility and lived with Family in a House home.  Home access is   .  Patient will benefit from skilled PT intervention to maximize safe functional mobility, minimize fall risk and decrease caregiver burden for planned discharge home with 24 hour assist.  Anticipate patient will benefit from follow up [facility] at discharge.  PT -  End  of Session Activity Tolerance: Tolerates 10 - 20 min activity with multiple rests Endurance Deficit: Yes PT Assessment Rehab Potential (ACUTE/IP ONLY): Good PT Barriers to Discharge: Decreased caregiver support;Inaccessible home environment;Behavior PT Patient demonstrates impairments in the following area(s): Balance;Safety;Behavior;Sensory;Skin Integrity;Endurance;Motor;Nutrition;Pain;Perception PT Transfers Functional Problem(s): Bed Mobility;Bed to Chair;Car;Furniture PT Locomotion Functional Problem(s): Ambulation;Wheelchair Mobility PT Plan PT Intensity: Minimum of 1-2 x/day ,45 to 90 minutes PT Frequency: 5 out of 7 days PT Duration Estimated Length of Stay: 4 weeks PT Treatment/Interventions: Ambulation/gait training;Community reintegration;DME/adaptive equipment instruction;Neuromuscular re-education;Psychosocial support;Stair training;UE/LE Strength taining/ROM;Wheelchair propulsion/positioning;Balance/vestibular training;Discharge planning;Pain management;Functional electrical stimulation;Skin care/wound management;Therapeutic Activities;UE/LE Coordination activities;Cognitive remediation/compensation;Disease management/prevention;Functional mobility training;Patient/family education;Splinting/orthotics;Therapeutic Exercise;Visual/perceptual remediation/compensation PT Transfers Anticipated Outcome(s): min-mod assist PT Locomotion Anticipated Outcome(s): max assist PT Recommendation Follow Up Recommendations: Home health PT Patient destination: Home Equipment Recommended: To be determined  Skilled Therapeutic Intervention Evaluation completed (see details above and below) with education on PT POC and goals and individual treatment initiated with focus on functional mobility, transfers, and positioning. Pt supine in bed upon PT arrival, no evidence of pain noted at rest, pt grimaces with L LE stretching/ROM (increased tone and spasticity noted). Pt participated in examination as  detailed below. Pt performed rolling to each side this session with total assist in order to don brief total assist. Pt transferred to sidelying>sitting total assist this session. Pt requires max assist for static sitting balance EOB and total assist for dynamic sitting balance. Pt performed lateral scoot slideboard transfer this session to TIS w/c with total +2 assist. Pt transported to the gym dependently in w/c. Therapist performed gentle PROM to all extremities this session for stretching and tone management. Therapist adjusted head rest for better alignment/positioning in w/c. Pt with limited R cervical rotation compared to L, therapist performed R cervical rotation stretching, PROM and AROM this session. Pt attempted x 2 to perform sit<>stands at the rail this session, pt able to grip the rail with R hand however limited strength impairing ability to help pull up, total assist to partially stand/clear hips from w/c. Pt used standing frame this session for LE weightbearing and standing tolerance x 3 minutes, therapist supporting trunk during this and facilitating increased trunk extension/upright posture. Pt transported back to room at end of session and left in w/c with needs in reach and chair alarm set.    PT Evaluation Precautions/Restrictions Precautions Precautions: Fall;Other (comment) Precaution Comments: check BP on left arm due to occlusion on R side. 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 Nods "yes" when asked about light touch Coordination Gross Motor Movements are Fluid and Coordinated: No Fine Motor Movements are Fluid and Coordinated: No Coordination and Movement Description: coordination impaired secondary to quadriparesis, increased tone and tightness Motor  Motor Motor: Abnormal tone;Hemiplegia;Abnormal postural alignment and control Motor - Skilled Clinical Observations: quadraparesis, some active movement R side>L side, increased tone L side>R side  Mobility Bed Mobility Bed Mobility: Rolling Right;Rolling Left;Sit to Supine;Supine to Sit Rolling Right: Total Assistance -  Patient < 25% Rolling Left: Total Assistance - Patient < 25% Supine to Sit: Dependent - Patient equal 0% Sit to Supine: Dependent - Patient equal 0% Transfers Transfers: Sit to Stand;Stand to Sit;Lateral/Scoot Transfers Sit to Stand: Dependent - Patient 0% Stand to Sit: Dependent - Patient equal 0% Lateral/Scoot Transfers: 2 Helpers Locomotion  Gait Ambulation: No Gait Gait: No Stairs / Additional Locomotion Stairs: No Architect: Yes Wheelchair Assistance: Dependent - Patient 0% Wheelchair Parts Management: Needs assistance Distance: 150 ft - dependent for w/c propulsion in TIS w/c  Trunk/Postural Assessment  Cervical Assessment Cervical Assessment: Exceptions to WFL(forward head posture) Thoracic Assessment Thoracic Assessment: Exceptions to WFL(rounded shoulders, kyphotic) Lumbar Assessment Lumbar Assessment: Exceptions to WFL(posterior pelvic tilt in sitting) Postural Control Postural Control: Deficits on evaluation Trunk Control: poor Righting Reactions: impaired  Balance Balance Balance Assessed: Yes Static Sitting Balance Static Sitting - Level of Assistance: 2: Max  assist;1: +1 Total assist Dynamic Sitting Balance Dynamic Sitting - Level of Assistance: 1: +2 Total assist Static Standing Balance Static Standing - Level of Assistance: 1: +2 Total assist Extremity Assessment  RLE Assessment RLE Assessment: Exceptions to [facility] Passive Range of Motion (PROM) Comments: limited hip extension, limited knee extension and DF to neutral RLE Strength Right Hip Flexion: 2-/5 Right Knee Flexion: 2-/5 Right Knee Extension: 2-/5 Right Ankle Dorsiflexion: 0/5 Right Ankle Plantar Flexion: 0/5 RLE Tone RLE Tone: Hypertonic;Modified Ashworth Body Part - Modified Ashworth Scale: Hamstrings Hamstrings - Modified Ashworth Scale for Grading Hypertonia RLE: More marked increase in muscle tone through most of the ROM, but affected part(s) easily moved LLE Assessment LLE Assessment: Exceptions to [facility] Passive Range of Motion (PROM) Comments: limited hip extension, limited knee extension and DF to neutral General Strength Comments: difficult to formally assess secondary to cognitive deficits LLE Strength Left Hip Flexion: 0/5 Left Hip Extension: 0/5 Left Knee Flexion: 0/5 Left Knee Extension: 0/5 Left Ankle Dorsiflexion: 0/5 Left Ankle Plantar Flexion: 0/5 LLE Tone LLE Tone: Moderate;Hypertonic;Modified Ashworth Body Part - Modified Ashworth Scale: Hamstrings Hamstrings - Modified Ashworth Scale for Grading Hypertonia LLE: Considerable increase in muschle tone, passive movement difficult Modified Ashworth Scale for Grading Hypertonia LLE: Considerable increase in muschle tone, passive movement difficult    Refer to Care Plan for Long Term Goals  Recommendations for other services: None   Discharge Criteria: Patient will be discharged from PT if patient refuses treatment 3 consecutive times without medical reason, if treatment goals not met, if there is a change in medical status, if patient makes no progress towards goals or if patient is discharged from  hospital.  The above assessment, treatment plan, treatment alternatives and goals were discussed and mutually agreed upon: No family available/patient unable  Netta Corrigan, PT, DPT, CSRS 06/13/2019, 10:31 AM

## 2019-06-14 ENCOUNTER — Inpatient Hospital Stay (HOSPITAL_COMMUNITY): Payer: Self-pay | Admitting: Occupational Therapy

## 2019-06-14 ENCOUNTER — Other Ambulatory Visit: Payer: Self-pay

## 2019-06-14 ENCOUNTER — Inpatient Hospital Stay (HOSPITAL_COMMUNITY): Payer: Self-pay | Admitting: Speech Pathology

## 2019-06-14 ENCOUNTER — Encounter (HOSPITAL_COMMUNITY): Payer: Self-pay

## 2019-06-14 ENCOUNTER — Inpatient Hospital Stay (HOSPITAL_COMMUNITY): Payer: Self-pay

## 2019-06-14 DIAGNOSIS — I639 Cerebral infarction, unspecified: Secondary | ICD-10-CM

## 2019-06-14 DIAGNOSIS — D72829 Elevated white blood cell count, unspecified: Secondary | ICD-10-CM

## 2019-06-14 DIAGNOSIS — I69391 Dysphagia following cerebral infarction: Secondary | ICD-10-CM

## 2019-06-14 DIAGNOSIS — R0989 Other specified symptoms and signs involving the circulatory and respiratory systems: Secondary | ICD-10-CM

## 2019-06-14 LAB — CBC WITH DIFFERENTIAL/PLATELET
Abs Immature Granulocytes: 0 10*3/uL (ref 0.00–0.07)
Basophils Absolute: 0.3 10*3/uL — ABNORMAL HIGH (ref 0.0–0.1)
Basophils Relative: 2 %
Eosinophils Absolute: 0.4 10*3/uL (ref 0.0–0.5)
Eosinophils Relative: 3 %
HCT: 42.6 % (ref 39.0–52.0)
Hemoglobin: 13.6 g/dL (ref 13.0–17.0)
Lymphocytes Relative: 23 %
Lymphs Abs: 3.4 10*3/uL (ref 0.7–4.0)
MCH: 30.1 pg (ref 26.0–34.0)
MCHC: 31.9 g/dL (ref 30.0–36.0)
MCV: 94.2 fL (ref 80.0–100.0)
Monocytes Absolute: 1 10*3/uL (ref 0.1–1.0)
Monocytes Relative: 7 %
Neutro Abs: 9.6 10*3/uL — ABNORMAL HIGH (ref 1.7–7.7)
Neutrophils Relative %: 65 %
Platelets: 302 10*3/uL (ref 150–400)
RBC: 4.52 MIL/uL (ref 4.22–5.81)
RDW: 15 % (ref 11.5–15.5)
WBC: 14.7 10*3/uL — ABNORMAL HIGH (ref 4.0–10.5)
nRBC: 0 % (ref 0.0–0.2)
nRBC: 0 /100 WBC

## 2019-06-14 LAB — COMPREHENSIVE METABOLIC PANEL
ALT: 37 U/L (ref 0–44)
AST: 26 U/L (ref 15–41)
Albumin: 2.5 g/dL — ABNORMAL LOW (ref 3.5–5.0)
Alkaline Phosphatase: 72 U/L (ref 38–126)
Anion gap: 9 (ref 5–15)
BUN: 21 mg/dL — ABNORMAL HIGH (ref 6–20)
CO2: 23 mmol/L (ref 22–32)
Calcium: 9 mg/dL (ref 8.9–10.3)
Chloride: 110 mmol/L (ref 98–111)
Creatinine, Ser: 0.78 mg/dL (ref 0.61–1.24)
GFR calc Af Amer: >60 mL/min (ref >60)
GFR calc non Af Amer: >60 mL/min (ref >60)
Glucose, Bld: 115 mg/dL — ABNORMAL HIGH (ref 70–99)
Potassium: 4 mmol/L (ref 3.5–5.1)
Sodium: 142 mmol/L (ref 135–145)
Total Bilirubin: 0.6 mg/dL (ref 0.3–1.2)
Total Protein: 6.6 g/dL (ref 6.5–8.1)

## 2019-06-14 LAB — GLUCOSE, CAPILLARY: Glucose-Capillary: 99 mg/dL (ref 70–99)

## 2019-06-14 NOTE — Progress Notes (Signed)
Orthopedic Tech Progress Note Patient Details:  Adrian [name] 21-Sep-1965 [id number] Called in orders to [facility] for BUE RESTING HAND SPLINTS as well as BLE PRAFO'S Patient ID: Adrian [name], male   DOB: Dec 29, 1965, 54 y.o.   MRN: [id number]   [name] 06/14/2019, 9:14 AM

## 2019-06-14 NOTE — Progress Notes (Signed)
Patient sitting up in wheelchair . Refused scheduled medications at this time. Will continue to monitor.

## 2019-06-14 NOTE — Care Management (Signed)
Patient Details  Name: Adrian [name] MRN: [id number] Date of Birth: 1965-07-24  Today's Date: 06/14/2019  Problem List:  Patient Active Problem List   Diagnosis Date Noted  . Labile blood pressure   . Dysphagia, post-stroke   . Cardioembolic stroke ([place]) 40/98/1191  . Chronic right-sided low back pain with right-sided sciatica   . Tobacco abuse   . Leukocytosis   . Prediabetes   . CVA (cerebral vascular accident) ([place]) 06/02/2019  . 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 Tobacco abuse    Past Surgical History: No past surgical history on file. Social History:  reports that he has been smoking cigarettes. He has been smoking about 4.00 packs per day. He does not have any smokeless tobacco history on file. He reports previous alcohol use. He reports that he does not use drugs.  Family / Support Systems Marital Status: Married Patient Roles: Spouse, Parent Spouse/Significant Other: Wife Mardene Celeste Children: Son Kailyn Other Supports: daughter and daughter in law Anticipated Caregiver: Wife and daughter Ability/Limitations of Caregiver: Wife has back and lung issues/daughter is able Caregiver Availability: 24/7  Social History Preferred language: English Religion:  Read: Yes Write: Yes Employment Status: Employed Name of Employer: [employer]   Abuse/Neglect Abuse/Neglect Assessment Can Be Completed: Yes Physical Abuse: Denies Verbal Abuse: Denies Sexual Abuse: Denies Exploitation of patient/patient's resources: Denies Self-Neglect: Denies  Emotional Status Pt's affect, behavior and adjustment status: Very emotional, flat affect, with poor adjustment to situation Recent Psychosocial Issues: Anxiety, depression Substance Abuse History: Tobacco abuse  Patient / Family Perceptions, Expectations & Goals Pt/Family understanding of illness & functional limitations: Patient does not understand current  illness and wife reports fair understanding of situation and functional limitations Premorbid pt/family roles/activities: Independent PTA, working when he had a stroke Anticipated changes in roles/activities/participation: Will need assistance with ADLs at discharge Pt/family expectations/goals: Wife reports she would like for the patient to be at a level her daughter can manage at discharge as she cannot provide physicial assistance  Community Resources Premorbid Home Care/DME Agencies: None Transportation available at discharge: Wife and daughter or daughter in law can assist with transportation Resource referrals recommended: Neuropsychology  Discharge Planning Living Arrangements: Spouse/significant other, Children Support Systems: Spouse/significant other, Children Type of Residence: Private residence(6 people in a single wide trailer) Insurance Resources: Teacher, adult education Screen Referred: Yes Money Management: Patient Does the patient have any problems obtaining your medications?: No Home Management: Wife and daughter will manage the home Patient/Family Preliminary Plans: Daughter moving back home to assist with care and son/daughter in law are local [place] Barriers to Discharge: Home environment access/layout, Decreased caregiver support Sw Barriers to Discharge Comments: Trailer with steps to entry/wife working on ramp for entry/wife has physical problems and is disabled Social Work Anticipated Follow Up Needs: [place] Additional Notes/Comments: Daughter moving back home to assist with care Expected length of stay: 4 weeks  Clinical Impression Patient very emotional during the conversation. Expressed that he wanted his wife to stay with him all of the time however she has health issues and noted she is exhausted from being here all of the time and cannot get the home ready for him if she is here. Reviewed ELOS and projected needs for assistance. Wife noted steps to entry  and will address a ramp for discharge. Noted their daughter is moving back in with them however there will be 6 people in the single wide trailer  but that is what they will need to do as she cannot provide physical assist. Very supportive family. Patient noted chronic back issues prior to all of this and the stroke was a surprise to all of them.  Chana Bode B 06/14/2019, 4:08 PM

## 2019-06-14 NOTE — Progress Notes (Signed)
Occupational Therapy Session Note  Patient Details  Name: Adrian [name] MRN: [id number] Date of Birth: Mar 15, 1965  Today's Date: 06/14/2019 OT Individual Time: 1002-1100 OT Individual Time Calculation (min): 58 min    Short Term Goals: Week 1:  OT Short Term Goal 1 (Week 1): patient will increase ability to roll right/left with max A of one OT Short Term Goal 2 (Week 1): patient will improve grasp (right or left) to allow him to hold / manipulate built up handled utencils OT Short Term Goal 3 (Week 1): patient will improve trunk mobility to increase sitting edge of bed balance with mod A of one  Skilled Therapeutic Interventions/Progress Updates:    Pt completed transfer from the tilt in space wheelchair to the therapy mat with total assist +2 (pt 10%) to begin session.  Worked on sitting balance on the EOM with pt needing overall total assist to maintain, but with BUEs placed in weightbearing on dycem, he was able to complete 3 intervals of 5, 15, and 20 seconds with min guard assist once midline was established with therapist assist.  He demonstrates decreased motor planning for activation of the core muscles including the abdominals and back extensors.  Had him work on activation of the elbow extensors in sitting as well with total support of the trunk.  He was able to extend the right elbow into therapist's hand at [facility] stage III level where as the LUE demonstrates Brunnstrum stage II.  Noted gross digit flexion and extension in the RUE as well of around 60% or better with only trace in the LUE.  He transferred back to the wheelchair at total assist +2 (pt 10%) via sliding board and returned to the room where he again completed transfer at the same level to the bed.  [name] in room and discussed his current level of assist and his likelyhood of needing a high amount of assist at discharge as well based on having limitations to all extremities as well as his trunk.  She voiced  understanding and will have assist from her children and possibly other family as well.  Pt needed total assist +2 (pt 10%) for sit to supine as well.  Will provide education on PROM/AAROM exercises for BUEs next session.    Therapy Documentation Precautions:  Precautions Precautions: Fall, Other (comment) Precaution Comments: check BP on left arm due to occlusion on R side. Restrictions Weight Bearing Restrictions: No  Pain: Pain Assessment Pain Scale: Faces Pain Score: 0-No pain ADL: See Care Tool Section for some details of mobility and selfcare  Therapy/Group: Individual Therapy  [place] 06/14/2019, 12:28 PM

## 2019-06-14 NOTE — Care Management (Signed)
[facility] Individual Statement of Services  Patient Name:  Adrian [name]  Date:  06/14/2019  Welcome to the [facility]patient [facility].  Our goal is to provide you with an individualized program based on your diagnosis and situation, designed to meet your specific needs.  With this comprehensive rehabilitation program, you will be expected to participate in at least 3 hours of rehabilitation therapies Monday-Friday, with modified therapy programming on the weekends.  Your rehabilitation program will include the following services:  Physical Therapy (PT), Occupational Therapy (OT), Speech Therapy (ST), 24 hour per day rehabilitation nursing, Therapeutic Recreation (TR), Neuropsychology, Case Management (Social Worker), Rehabilitation Medicine, Nutrition Services and Pharmacy Services  Weekly team conferences will be held on Wednesdays to discuss your progress.  Your Social Automotive engineer will talk with you frequently to get your input and to update you on team discussions.  Team conferences with you and your family in attendance may also be held.  Expected length of stay: 4 weeks  Overall anticipated outcome: Minimal - Moderate Assistance  Depending on your progress and recovery, your program may change. Your Social Automotive engineer will coordinate services and will keep you informed of any changes. Your Social Worker's/Care Manager's name and contact numbers are listed  below.  The following services may also be recommended but are not provided by the Inpatient [facility] Center:    Home Health Rehabilitation Services  Outpatient Rehabilitation Services   Arrangements will be made to provide these services after discharge if needed.  Arrangements include referral to agencies that provide these services.  Your insurance has been verified to be:  Uninsured Your primary doctor is:  Marin Comment, FNP  Pertinent information will be shared with your doctor  and your insurance company.  Care Manager/Social Worker: Chana Bode, RN [phone] or ([phone]  Information discussed with and copy given to patient by: Pamelia Hoit, 06/14/2019, 3:55 PM

## 2019-06-14 NOTE — Progress Notes (Signed)
Occupational Therapy Session Note  Patient Details  Name: Adrian [name] MRN: [id number] Date of Birth: [birth year]  Today's Date: 06/14/2019 OT Individual Time: 1300-1330 OT Individual Time Calculation (min): 30 min    Short Term Goals: Week 1:  OT Short Term Goal 1 (Week 1): patient will increase ability to roll right/left with max A of one OT Short Term Goal 2 (Week 1): patient will improve grasp (right or left) to allow him to hold / manipulate built up handled utencils OT Short Term Goal 3 (Week 1): patient will improve trunk mobility to increase sitting edge of bed balance with mod A of one  Skilled Therapeutic Interventions/Progress Updates:    Upon entering the room, pt supine in bed with wife present at bedside. Pt agreeable to OT intervention with no signs or symptoms of pain during session. OT providing paper handout of PROM exercises for B UEs in all planes of movement. OT demonstrating on L UE and while caregiver returning description on R UE. OT then having pt feel increased tone in L UE to compare the difference. Caregiver reports needing to sit herself secondary to back problems. OT providing information about body mechanics during this task based on caregiver needs as well. Pt remained in bed with all needs within reach. Bed alarm activated.   Therapy Documentation Precautions:  Precautions Precautions: Fall, Other (comment) Precaution Comments: check BP on left arm due to occlusion on R side. Restrictions Weight Bearing Restrictions: No ADL: ADL Eating: Dependent Where Assessed-Eating: Bed level Grooming: Dependent Where Assessed-Grooming: Bed level Upper Body Bathing: Dependent Where Assessed-Upper Body Bathing: Bed level Lower Body Bathing: Dependent Where Assessed-Lower Body Bathing: Bed level Upper Body Dressing: Dependent Where Assessed-Upper Body Dressing: Bed level Lower Body Dressing: Dependent Where Assessed-Lower Body Dressing: Bed level Toileting:  Dependent Where Assessed-Toileting: Bed level   Therapy/Group: Individual Therapy  Alen Bleacher 06/14/2019, 2:27 PM

## 2019-06-14 NOTE — Plan of Care (Signed)
  Problem: Consults Goal: RH STROKE PATIENT EDUCATION Description: See Patient Education module for education specifics  Outcome: Progressing   Problem: RH BOWEL ELIMINATION Goal: RH STG MANAGE BOWEL WITH ASSISTANCE Description: STG Manage Bowel with mod Assistance. Outcome: Progressing Goal: RH STG MANAGE BOWEL W/MEDICATION W/ASSISTANCE Description: STG Manage Bowel with Medication with mod Assistance. Outcome: Progressing   Problem: RH BLADDER ELIMINATION Goal: RH STG MANAGE BLADDER WITH ASSISTANCE Description: STG Manage Bladder With mod Assistance Outcome: Progressing   Problem: RH KNOWLEDGE DEFICIT Goal: RH STG INCREASE KNOWLEDGE OF DYSPHAGIA/FLUID INTAKE Description: Patient will be able to demonstrate understanding of thickened fluid necessity Outcome: Progressing

## 2019-06-14 NOTE — Progress Notes (Signed)
Patient information reviewed and entered into [facility] by Becky Eliodoro Gullett, PPS coordinator. Information including medical coding, function ability, and quality indicators will be reviewed and updated through discharge.   

## 2019-06-14 NOTE — Progress Notes (Signed)
Speech Language Pathology Daily Session Note  Patient Details  Name: Adrian [name] MRN: [id number] Date of Birth: 07-06-1965  Today's Date: 06/14/2019 SLP Individual Time: 1346-1430 SLP Individual Time Calculation (min): 44 min  Short Term Goals: Week 1: SLP Short Term Goal 1 (Week 1): Pt will demonstrated efficient mastication and oral clearance of puree solids with Max A multimodal cueing for use of compensaotry swallow strategies. SLP Short Term Goal 2 (Week 1): Pt will consume therapeutic trials of ice and/or thin H2O with adequate oral containment and minimal overt s/sx aspiration with Max A multimodal cues for accurate use of chin tuck maneuver. SLP Short Term Goal 3 (Week 1): Pt communicate basic wants and needs via multimodal means with Max A cues. SLP Short Term Goal 4 (Week 1): Pt will verbalize at the word level to achieve 25% intelligibility with Max A multimodal cueing for intelligibility strategies. SLP Short Term Goal 5 (Week 1): Pt will sustain attention to functional tasks for 3-5 minutes with Max A multimodal cues. SLP Short Term Goal 6 (Week 1): Pt will demonstrate ability to initiate and problem solve basic familiar tasks with Max A multimodal cueing.  Skilled Therapeutic Interventions: Pt was seen for skilled ST targeting speech and dysphagia goals. Pt engaged in naming tasks with 100% accuracy, however intelligibility was reduced to ~40% intelligibility at the word level with context known to SLP. He required Max A verbal cues to implement increased vocal intensity and pacing to most effectively increase his intelligibility. Words with >1 syllable significantly reduced intelligibility, partially due to articulatory imprecision but also due to decreased breath support, which resulted in pt omitting some syllables. Most effective means of communicating his wants and needs throughout session was achieved via Max A multimodal cueing to utilize head shake yes or no when questions were  reformatted into yes/no form.  Pt was agreeable to consume honey thick liquids via teaspoon, and wife was present for training to become signed off to provide supervision during meals. She demonstrated ability to use slow rate and appropriate bite size, as well as to gauge when pt had triggered swallow and ready for additional bolus presentations. SLP also educated regarding need to check oral cavity after intake, use suction as necessary to clear residue, as well as optimal positioning for intake and to stay upright for at least 30-60 minutes after intake. Pt's wife also trained to provide oral care via suction toothbrush. Pt left semi-reclined in bed with alarm set and needs within reach. Continue per current plan of care.        Pain Pain Assessment Pain Scale: Faces Faces Pain Scale: Hurts a little bit Pain Type: Acute pain Pain Location: Back Pain Descriptors / Indicators: Aching;Grimacing Pain Onset: On-going Pain Intervention(s): Distraction;Emotional support;Repositioned Multiple Pain Sites: No  Therapy/Group: Individual Therapy  [name] 06/14/2019, 7:13 AM

## 2019-06-14 NOTE — Progress Notes (Signed)
Physical Therapy Session Note  Patient Details  Name: Adrian [name] MRN: [id number] Date of Birth: Sep 01, 1965  Today's Date: 06/14/2019 PT Individual Time: 0803-0900 PT Individual Time Calculation (min): 57 min    Short Term Goals: Week 1:  PT Short Term Goal 1 (Week 1): Pt will perform bed<>chair transfer with Max assist +2 PT Short Term Goal 2 (Week 1): Pt will maintain static sitting balance EOB with UE support and Mod assist +1 PT Short Term Goal 3 (Week 1): Pt will tolerate x 5 minutes in the standing frame for LE weightbearing and standing tolerance, dependent assist.  Skilled Therapeutic Interventions/Progress Updates:    Pt supine in bed upon PT arrival, agreeable to therapy tx and no evidence of pain at rest. Pt does grimace today with stretching/PROM and also nods yes to pain/discomfort in abdomen when OOB. Pt performed rolling to the R with total assist to check for incontinence, none noted. Supine in bed therapist performed B LE PROM and stretching for tone management to B hamstrings, heel cords and adductors. Also performed UE ER and elbow extension PROM/stretching this session. Pt transferred to L sidelying with total assist and then sidelying>sitting with total +2 assist. Pt seated EOB worked on static/dynamic sitting balance, pt with not activation noted to correct LOB in any direction, requiring total assist to correct LOB. Pt can intermittently sustain static sitting with min-mod assist briefly for about 10 sec with cues. Pt performed total assist +2 slideboard transfer to w/c this session. Transported to the gym. Pt used standing frame x 3 min and x 5 min this session working on LE weightbearing and upright posture, in standing frame assist to extend hips/trunk for increased upright posture, in standing frame therapist bringing R knee back into increased extension with quad activation noted. Pt transported back to room and left in TIS with needs in reach and chair alarm set.    Therapy Documentation Precautions:  Precautions Precautions: Fall, Other (comment) Precaution Comments: check BP on left arm due to occlusion on R side. Restrictions Weight Bearing Restrictions: No    Therapy/Group: Individual Therapy  Cresenciano Genre, PT, DPT, CSRS 06/14/2019, 7:38 AM

## 2019-06-14 NOTE — Progress Notes (Signed)
Patient and family refused CPT.  Stated "He is sleeping"

## 2019-06-14 NOTE — Progress Notes (Signed)
[place] PHYSICAL MEDICINE & REHABILITATION PROGRESS NOTE   Subjective/Complaints: Patient seen laying in bed this morning.  No reported issues overnight.  He is aphasic.  ROS- unable to eval due to aphasia/dysarthria  Objective:   DG Lumbar Spine 2-3 Views  Result Date: 06/13/2019 CLINICAL DATA:  Low back pain EXAM: LUMBAR SPINE - 2-3 VIEW COMPARISON:  None. FINDINGS: Colonic contrast is seen.  Colonic diverticuli. Evaluation of the lumbar spine on the lateral view is obscured by overlapping contrast filled colon. Within this limitation, no traumatic malalignment or fracture seen. Mild multilevel degenerative disc disease is noted. IMPRESSION: 1. Contrast in the colon limits evaluation lumbar spine on the lateral view. No fracture or traumatic malalignment seen within these limitations. Mild degenerative changes. Electronically Signed   By: Dorise Bullion III M.D   On: 06/13/2019 14:15   Recent Labs    06/12/19 0420 06/14/19 0652  WBC 15.5* 14.7*  HGB 14.4 13.6  HCT 43.6 42.6  PLT 284 302   Recent Labs    06/12/19 0420 06/14/19 0652  NA 143 142  K 3.9 4.0  CL 108 110  CO2 24 23  GLUCOSE 108* 115*  BUN 23* 21*  CREATININE 0.80 0.78  CALCIUM 9.1 9.0    Intake/Output Summary (Last 24 hours) at 06/14/2019 1020 Last data filed at 06/14/2019 0745 Gross per 24 hour  Intake 1410.07 ml  Output 350 ml  Net 1060.07 ml     Physical Exam: Vital Signs Blood pressure (!) 150/89, pulse 90, temperature 98.5 F (36.9 C), resp. rate 17, height 5\' 11"  (1.803 m), weight 66.3 kg, SpO2 93 %. Constitutional: No distress . Vital signs reviewed. HENT: Normocephalic.  Atraumatic. Eyes: EOMI. No discharge. Cardiovascular: No JVD. Respiratory: Normal effort.  No stridor. GI: Non-distended. Skin: Warm and dry.  Intact. Psych: Unable to assess due to aphasia Musc: No edema in extremities.  No tenderness in extremities. Neurologic: Alert Motor: No spontaneous movement noted, unable to  follow commands despite visual and tactile cues Severe dysarthria  Assessment/Plan: 1. 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  Physiatrist is providing close team supervision and 24 hour management of active medical problems listed below.  Physiatrist and rehab team continue to assess barriers to discharge/monitor patient progress toward functional and medical goals  Care Tool:  Bathing        Body parts bathed by helper: Right arm, Left arm, Chest, Abdomen, Front perineal area, Buttocks, Right upper leg, Left upper leg, Right lower leg, Left lower leg, Face     Bathing assist Assist Level: Dependent - Patient 0%     Upper Body Dressing/Undressing Upper body dressing   What is the patient wearing?: Hospital gown only    Upper body assist Assist Level: Dependent - Patient 0%    Lower Body Dressing/Undressing Lower body dressing      What is the patient wearing?: Pants, Incontinence brief     Lower body assist Assist for lower body dressing: Dependent - Patient 0%     Toileting Toileting    Toileting assist Assist for toileting: 2 Helpers     Transfers Chair/bed transfer  Transfers assist  Chair/bed transfer activity did not occur: Safety/medical concerns  Chair/bed transfer assist level: 2 Helpers     Locomotion Ambulation   Ambulation assist   Ambulation activity did not occur: Safety/medical concerns  Walk 10 feet activity   Assist  Walk 10 feet activity did not occur: Safety/medical concerns        Walk 50 feet activity   Assist Walk 50 feet with 2 turns activity did not occur: Safety/medical concerns         Walk 150 feet activity   Assist Walk 150 feet activity did not occur: Safety/medical concerns         Walk 10 feet on uneven surface  activity   Assist Walk 10  feet on uneven surfaces activity did not occur: Safety/medical concerns         Wheelchair     Assist     Wheelchair activity did not occur: Safety/medical concerns         Wheelchair 50 feet with 2 turns activity    Assist    Wheelchair 50 feet with 2 turns activity did not occur: Safety/medical concerns       Wheelchair 150 feet activity     Assist  Wheelchair 150 feet activity did not occur: Safety/medical concerns       Blood pressure (!) 150/89, pulse 90, temperature 98.5 F (36.9 C), resp. rate 17, height 5\' 11"  (1.803 m), weight 66.3 kg, SpO2 93 %.  Medical Problem List and Plan: 1.Dysarthria/quadriparesissecondary to multifocal infarcts mostly posterior circulation but also bilateral anterior circulation cardioembolic pattern as well as innominate artery occlusion, left ICA thrombus versus soft plaque, right V4 and basal artery occlusive thrombus  Continue CIR  2. Antithrombotics: -DVT/anticoagulation:Lovenox -antiplatelet therapy: Aspirin 325 mg and Plavix 75 mg daily 3. Pain Management:Tramadol as needed 4. Mood:Wellbutrin 150 mg twice daily, may need trial of Nuedexta, expect PBA given extensive posterior > anterior circulation infarcts -antipsychotic agents: Seroquel 25 mg 3 times daily as needed 5. Neuropsych: This patientis notcapable of making decisions on hisown behalf. 6. Skin/Wound Care:Routine skin checks 7. Fluids/Electrolytes/Nutrition:Routine in and outs  BMP within acceptable range on 4/26 8. Post stroke dysphagia. Dysphagia #1 honey thick liquids, advance diet as tolerated.  Monitor hydration- expect need for PEG poor prognosis for recovery in short term  9. Orthostatic hypotension. Monitor with increased mobility  Labile on 4/26, continue to monitor  TEDs/Abd binder as needed 10. Hyperlipidemia. Lipitor 11. Tobacco abuse. NicoDerm patch.  Counseled when possible 12. Leukocytosis.    WBCs 14.7 on 4/26, trending down  Afebrile   Continue to monitor  LOS: 2 days A FACE TO FACE EVALUATION WAS PERFORMED  Zeddie Njie 5/26 06/14/2019, 10:20 AM

## 2019-06-14 NOTE — Progress Notes (Signed)
PMR Admission Coordinator Pre-Admission Assessment  Patient: Adrian [name] is an 54 y.o., male MRN: [id number] DOB: 11/07/1965 Height: 5' 7" (170.2 cm) Weight: 71.1 kg              Insurance Information HMO:     PPO:      PCP:      IPA:      80/20:      OTHER:  PRIMARY: Uninsured.       Policy#:       Subscriber:  CM Name:       Phone#:      Fax#:  Pre-Cert#:       Employer:  Benefits:  Phone #:      Name:  Eff. Date:      Deduct:       Out of Pocket Max:       Life Max:   CIR:       SNF:  Outpatient:      Co-Pay:  Home Health:       Co-Pay:  DME:      Co-Pay:  Providers:  SECONDARY:       Policy#:       Phone#:   Financial Counselor: Kenda Revels       Phone#: [phone]  The "Data Collection Information Summary" for patients in Inpatient Rehabilitation Facilities with attached "Privacy Act Statement-Health Care Records" was provided and verbally reviewed with: N/A  Emergency Contact Information Contact Information    Name Relation Home Work Mobile   Geurin,Patricia Spouse   [phone]   Todt, Mutasim Son   [phone]     Current Medical History  Patient Admitting Diagnosis: bilateral posterior circulation infarcts, bilateral anterior circulation infarcts   History of Present Illness: Terrian R. Swinson is a 54-year-old right-handed male with history of tobacco abuse and chronic low back pain maintained on Neurontin 600 mg 3 times daily as well as prednisone pack.. History taken from chart review wife and son due to patient speech and cognition. Independent prior to admission. Presented 06/02/2019 with dysarthria and quadriparesis. Admission chemistries with WBC 21,000, BUN 24, urine drug screen negative. CT showed multiple bilateral infarcts. Per report numerous small supratentorial and infratentorial acute infarcts involving the cerebral hemispheres, left thalamus, pons and right cerebellar hemisphere. Chronic right frontoparietal cortical infarcts. Follow-up MRI showed slight  increase in size of bilateral acute infarcts of the pons. Unchanged infarcts of the left thalamus left caudate nucleus, right cerebellum and right frontal white matter. No hemorrhage or mass-effect.. Patient did not receive TPA. 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 Occlusive thrombus also suspected within the very distal V4 left vertebral artery. Echocardiogram with ejection fraction of 70% grade 1 diastolic dysfunction. EEG negative for seizure.  CTA head and neck repeated 06/09/2019 unchanged multivessel occlusion.  Recommendations are DAPT of aspirin 325 mg and Plavix 75 mg daily.  Patient had initially been maintained on intravenous heparin which is since been discontinued and currently maintained on Lovenox.  Dysphagia #1 honey thick liquid diet monitoring of hydration and still with ongoing secretion management.  Patient very labile as well as anxious and placed on Wellbutrin as well as the addition of Seroquel as needed.  Bouts of orthostasis initially and did require fluid resuscitation.  His Levophed has been discontinued.  Noted leukocytosis as patient had been on initial prednisone x4 days prior to admission for   chronic back pain since discontinued latest WBC 19,400 and latest chest x-ray no focal consolidation.  Therapy evaluations completed and patient was recommended for a comprehensive rehab program.  Complete NIHSS TOTAL: 21 Glasgow Coma Scale Score: 14  Past Medical History  Past Medical History:  Diagnosis Date  . Tobacco abuse     Family History  family history includes Hypertension in his father and mother.  Prior Rehab/Hospitalizations:  Has the patient had prior rehab or hospitalizations prior to admission? No  Has the patient had major surgery during 100 days prior to admission? No  Current Medications   Current Facility-Administered  Medications:  .  acetaminophen (TYLENOL) tablet 650 mg, 650 mg, Oral, Q6H, Sethi, Pramod S, MD, 650 mg at 06/10/19 1224 .  aspirin tablet 325 mg, 325 mg, Oral, Daily, 325 mg at 06/10/19 0941 **OR** aspirin suppository 300 mg, 300 mg, Rectal, Daily, Sethi, Pramod S, MD .  atorvastatin (LIPITOR) tablet 80 mg, 80 mg, Oral, Daily, Sethi, Pramod S, MD, 80 mg at 06/10/19 0941 .  buPROPion (WELLBUTRIN) tablet 150 mg, 150 mg, Oral, BID, Sethi, Pramod S, MD, 150 mg at 06/10/19 0942 .  Chlorhexidine Gluconate Cloth 2 % PADS 6 each, 6 each, Topical, Daily, Lindzen, Eric, MD, 6 each at 06/10/19 1225 .  clopidogrel (PLAVIX) tablet 75 mg, 75 mg, Oral, Daily, Sethi, Pramod S, MD, 75 mg at 06/10/19 0941 .  enoxaparin (LOVENOX) injection 40 mg, 40 mg, Subcutaneous, Q24H, Mullis, Kiersten P, DO, 40 mg at 06/10/19 1224 .  gabapentin (NEURONTIN) 250 MG/5ML solution 600 mg, 600 mg, Oral, TID, Chen, Lydia D, RPH .  guaiFENesin-dextromethorphan (ROBITUSSIN DM) 100-10 MG/5ML syrup 5 mL, 5 mL, Oral, Q4H PRN, Sethi, Pramod S, MD, 5 mL at 06/06/19 0938 .  MEDLINE mouth rinse, 15 mL, Mouth Rinse, BID, Xu, Jindong, MD, 15 mL at 06/10/19 1225 .  nicotine (NICODERM CQ - dosed in mg/24 hours) patch 21 mg, 21 mg, Transdermal, Daily, Walsh, Tanya, MD, 21 mg at 06/10/19 0941 .  [START ON 06/11/2019] pantoprazole sodium (PROTONIX) 40 mg/20 mL oral suspension 40 mg, 40 mg, Oral, Daily, Chen, Lydia D, RPH .  Resource ThickenUp Clear, , Oral, PRN, Xu, Jindong, MD .  sodium chloride HYPERTONIC 3 % nebulizer solution 4 mL, 4 mL, Nebulization, Q4H while awake, Xu, Jindong, MD, 4 mL at 06/10/19 1531 .  traMADol (ULTRAM) tablet 50 mg, 50 mg, Oral, Q6H PRN, Sethi, Pramod S, MD, 50 mg at 06/09/19 0245 .  white petrolatum (VASELINE) gel, , Topical, PRN, Lindzen, Eric, MD, 0.2 application at 06/03/19 2105  Patients Current Diet:  Diet Order            DIET - DYS 1 Room service appropriate? No; Fluid consistency: Honey Thick; Fluid restriction:  Other (see comments)  Diet effective now              Precautions / Restrictions Precautions Precautions: Fall, Other (comment) Precaution Comments: check BP on left arm due to occlusion on R side. 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  Independent  Indoor Mobility:   Did the patient need assistance with walking from room to room (with or without device)? Independent  Stairs: Did the patient need assistance with internal or external stairs (with or without device)? Independent  Functional Cognition: Did the patient need help planning regular tasks such as shopping or remembering to take medications? Independent  Home Assistive Devices / Equipment    Prior Device Use: Indicate devices/aids used by the patient prior to current illness, exacerbation or injury? None of the above  Current Functional Level Cognition  Arousal/Alertness: Lethargic Overall Cognitive Status: Difficult to assess Difficult to assess due to: Impaired communication Current Attention Level: Sustained Orientation Level: Oriented to person, Oriented to place, Oriented to situation, Disoriented to time Following Commands: Follows one step commands inconsistently, Follows one step commands with increased time Safety/Judgement: Decreased awareness of safety, Decreased awareness of deficits General Comments: Labile with increased interaction, can respond to yes/no questions   Attention: Sustained Sustained Attention: Impaired Sustained Attention Impairment: Functional basic,  Verbal basic Behaviors: Lability    Extremity Assessment (includes Sensation/Coordination)  Upper Extremity Assessment: Generalized weakness  Lower Extremity Assessment: Defer to PT evaluation RLE Deficits / Details: right leg weaker than left leg, but able to take weight.  Some flexion buckling noted in stance and difficulty stepping and progressing leg around when taking pivotal steps to the chair.      ADLs  Overall ADL's : Needs assistance/impaired Eating/Feeding: NPO Grooming: Wash/dry face, Sitting, Maximal assistance, Total assistance Upper Body Bathing: Moderate assistance, Sitting Lower Body Bathing: Maximal assistance, Sit to/from stand, +2 for safety/equipment, +2 for physical assistance Upper Body Dressing : Moderate assistance, Sitting Lower Body Dressing: Maximal assistance, +2 for physical assistance, +2 for safety/equipment, Sit to/from stand Toilet Transfer: +2 for physical assistance, +2 for safety/equipment, Stand-pivot, Moderate assistance Functional mobility during ADLs: Moderate assistance, +2 for physical assistance, +2 for safety/equipment, Maximal assistance General ADL Comments: Pt able to sit EOB with support from therapists, but was total A to complete basic ADLs, minimal movement noted in RUE, but not enough to hold a washcloth to complete hand over hand techniques    Mobility  Overal bed mobility: Needs Assistance Bed Mobility: Rolling, Sidelying to Sit, Sit to Sidelying Rolling: Total assist, +2 for physical assistance Sidelying to sit: +2 for physical assistance, Total assist Supine to sit: +2 for physical assistance, Mod assist Sit to supine: +2 for physical assistance, Total assist Sit to sidelying: Total assist, +2 for physical assistance General bed mobility comments: total A +2 for all bed mobility; minimal movement on R UE and L LE with multimodal cues/assist prior to sitting EOB    Transfers  Overall transfer level: Needs assistance Equipment  used: 2 person hand held assist Transfer via Lift Equipment: Maxisky Transfers: Sit to/from Stand, Stand Pivot Transfers Sit to Stand: +2 physical assistance, Mod assist Stand pivot transfers: +2 physical assistance, Mod assist General transfer comment: Used maxi sky total lift for OOB to chair for increased upright posture, repositioning.     Ambulation / Gait / Stairs / Wheelchair Mobility  Ambulation/Gait General Gait Details: unable at this time.     Posture / Balance Dynamic Sitting Balance Sitting balance - Comments: no righting reaction at trunk, able to hold position with bilateral elbows on quads, attempted pushing off of bed in NDT technique on R elbow x2, trace movement noted Balance Overall balance assessment: Needs assistance Sitting-balance support: Feet supported, No upper extremity supported, Bilateral upper extremity supported Sitting balance-Leahy Scale: Zero Sitting balance - Comments:   no righting reaction at trunk, able to hold position with bilateral elbows on quads, attempted pushing off of bed in NDT technique on R elbow x2, trace movement noted Standing balance support: Bilateral upper extremity supported Standing balance-Leahy Scale: Poor Standing balance comment: needed mod two person assist to stand for balance and to block R LE.    Special needs/care consideration      Previous Home Environment (from acute therapy documentation) Living Arrangements: Spouse/significant other, Children(son and wife patricia)  Lives With: Family Available Help at Discharge: Family Type of Home: House Home Care Services: No Additional Comments: Pt frustrated by dysarthria, did not like answering too many questions, will need more info  Discharge Living Setting Plans for Discharge Living Setting: Patient's home Type of Home at Discharge: House Does the patient have any problems obtaining your medications?: No  Social/Family/Support Systems Patient Roles: Spouse Contact  Information: Patricia Hagemeister Anticipated Caregiver: Patricia, son Tanmay Anticipated Caregiver's Contact Information: Patricia [phone], Zakariyya [phone] Caregiver Availability: 24/7 Discharge Plan Discussed with Primary Caregiver: Yes Is Caregiver In Agreement with Plan?: Yes Does Caregiver/Family have Issues with Lodging/Transportation while Pt is in Rehab?: No   Goals Patient/Family Goal for Rehab: PT/OT lofty mod/max +1, SLP mod assist Expected length of stay: 32-34 days Additional Information: benefit from neuropsych as soon as able Pt/Family Agrees to Admission and willing to participate: Yes Program Orientation Provided & Reviewed with Pt/Caregiver Including Roles  & Responsibilities: Yes  Barriers to Discharge: Home environment access/layout, Insurance for SNF coverage, Other (comments)(level of care)   Decrease burden of Care through IP rehab admission: Specialzed equipment needs, Diet advancement, Decrease number of caregivers and Patient/family education   Possible need for SNF placement upon discharge: Not anticipated.    Patient Condition: This patient's medical and functional status has changed since the consult dated: 06/04/19 in which the Rehabilitation Physician determined and documented that the patient's condition is appropriate for intensive rehabilitative care in an inpatient rehabilitation facility. See "History of Present Illness" (above) for medical update. Functional changes are: total +2 assist to EOB. Patient's medical and functional status update has been discussed with the Rehabilitation physician and patient remains appropriate for inpatient rehabilitation. Will admit to inpatient rehab Saturday.  Preadmission Screen Completed By:  Demontre Padin E Philmore Lepore, PT, DPT 06/10/2019 3:50 PM ______________________________________________________________________   Discussed status with Dr. Kirsteins on 06/10/19 at 3:55 PM  and received approval for admission  today.  Admission Coordinator:  Aurore Redinger E Graham Doukas, PT, DPT time 3:55 PM /Date 06/10/19       Broadus JohnWarren, PT, DPT 06/10/2019 3:50 PM ______________________________________________________________________   Discussed status with Dr. Wynn BankerKirsteins on 06/10/19 at 3:55 PM  and received approval for admission today.   Admission Coordinator:  Stephania Fragminaitlin E Kye Silverstein, PT, DPT time 3:55 PM Dorna Bloom/Date 06/10/19

## 2019-06-15 ENCOUNTER — Inpatient Hospital Stay (HOSPITAL_COMMUNITY): Payer: Self-pay

## 2019-06-15 ENCOUNTER — Inpatient Hospital Stay (HOSPITAL_COMMUNITY): Payer: Self-pay | Admitting: Speech Pathology

## 2019-06-15 ENCOUNTER — Inpatient Hospital Stay (HOSPITAL_COMMUNITY): Payer: Self-pay | Admitting: Occupational Therapy

## 2019-06-15 ENCOUNTER — Encounter (HOSPITAL_COMMUNITY): Payer: Self-pay | Admitting: Psychology

## 2019-06-15 DIAGNOSIS — S069X9S Unspecified intracranial injury with loss of consciousness of unspecified duration, sequela: Secondary | ICD-10-CM

## 2019-06-15 DIAGNOSIS — G3189 Other specified degenerative diseases of nervous system: Secondary | ICD-10-CM

## 2019-06-15 DIAGNOSIS — F329 Major depressive disorder, single episode, unspecified: Secondary | ICD-10-CM

## 2019-06-15 DIAGNOSIS — F09 Unspecified mental disorder due to known physiological condition: Secondary | ICD-10-CM

## 2019-06-15 NOTE — Progress Notes (Signed)
Physical Medicine and Rehabilitation Consult Reason for Consult: Weakness and dysarthria Referring Physician: Dr. Pearlean Brownie     HPI: Adrian [name] is a 54 y.o. right-handed male with history of tobacco abuse and chronic low back pain.  History taken from chart review, wife, and son due to cognition.  Independent prior to admission.  He presented 06/02/19 with dysarthria weakness.  Admission chemistries with WBC 21,000, BUN 24, urine drug screen negative.  CT showed multiple bilateral infarcts.  Per report, numerous small supratentorial and infratentorial acute infarcts involving the cerebral hemispheres, left thalamus, pons and right cerebellar hemisphere.  Chronic right frontoparietal cortical infarcts.  Follow-up MRI personally reviewed, showing increase in size of brainstem infarcts.  Patient did not receive TPA.  CT angiogram head and neck occlusive thrombus within the distal V4 right vertebral artery with occlusive thrombus extending into the proximal and mid basilar artery.  Occlusive thrombus also suspected within the very distal V4 left vertebral artery.  No anterior circulation large vessel occlusion or proximal high-grade stenosis identified.  Echocardiogram showed ejection fraction of 70% with grade 1 diastolic dysfunction.  EEG negative for seizures.  Currently maintained on aspirin and Plavix for CVA prophylaxis.  Subcutaneous Lovenox for DVT prophylaxis.  Hospital course complicated by dysphagia, started on dysphagia 1 nectar thick liquid diet.  Therapy evaluations completed with recommendations of physical medicine rehab consult.   Review of Systems  Unable to perform ROS: Mental acuity        Past Medical History:  Diagnosis Date  . Tobacco abuse      History reviewed. No pertinent surgical history.      Family History  Problem Relation Age of Onset  . Hypertension Mother    . Hypertension Father      Social History:  reports that he has been smoking cigarettes. He has  been smoking about 4.00 packs per day. He does not have any smokeless tobacco history on file. No history on file for alcohol and drug. Allergies:       Allergies  Allergen Reactions  . Codeine Other (See Comments)      unknown  . Motrin [Ibuprofen] Other (See Comments)      unknown  . Penicillins Other (See Comments)      unknown          Medications Prior to Admission  Medication Sig Dispense Refill  . gabapentin (NEURONTIN) 600 MG tablet Take 600 mg by mouth 3 (three) times daily.      . pantoprazole (PROTONIX) 40 MG tablet Take 40 mg by mouth daily.      . predniSONE (DELTASONE) 10 MG tablet Take 10 mg by mouth See admin instructions. 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  Light mod  assist.  Pt needed help with his legs, pulled with bil hands to help support trunk.  Transfers Overall transfer level: Needs assistance Equipment used: 2 person hand held assist Transfers: Sit to/from Stand, Stand Pivot Transfers Sit to Stand: +2 physical assistance, Mod assist Stand pivot transfers: +2 physical assistance, Mod assist General transfer comment: Two person mod assist to stand and pivot to pt's left from bed to chair. Pt able  to take pivotal steps with bil LEs blocked. R leg buckling more and needed more assist to step and progress leg around.     ADL: ADL Overall ADL's : Needs assistance/impaired Eating/Feeding: NPO Grooming: Wash/dry face, Minimal assistance, Sitting Upper Body Bathing: Moderate assistance, Sitting Lower Body Bathing: Maximal assistance, Sit to/from stand, +2 for safety/equipment, +2 for physical assistance Upper Body Dressing : Moderate assistance, Sitting Lower Body Dressing: Maximal assistance, +2 for physical assistance, +2 for safety/equipment, Sit to/from stand Toilet Transfer: +2 for physical assistance, +2 for safety/equipment, Stand-pivot, Moderate assistance Functional mobility during ADLs: Moderate assistance, +2 for physical assistance, +2 for safety/equipment(stand pivot)   Cognition: Cognition Overall Cognitive Status: Impaired/Different from baseline Orientation Level: Oriented to person, Oriented to place, Disoriented to time, Disoriented to situation Cognition Arousal/Alertness: Awake/alert Behavior During Therapy: Impulsive Overall Cognitive Status: Impaired/Different from baseline Area of Impairment: Orientation, Attention, Memory, Following commands, Safety/judgement, Awareness, Problem solving Orientation Level: (Pt becoming frustrated and avoiding orientation questions) Current Attention Level: Sustained Memory: Decreased recall of precautions, Decreased short-term memory Following Commands: Follows one step commands with increased time Safety/Judgement: Decreased awareness of safety, Decreased awareness of deficits Awareness: Intellectual Problem Solving: Difficulty sequencing, Requires verbal cues, Requires tactile cues General Comments: Pt emotionally labile, frustrated by speech difficulties, decreased awareness of his deficits and how it realates to safety.  "Can you get back to the bed safely by yourself?" "Yep".   Blood pressure (!) 164/73, pulse 70,  temperature 98.7 F (37.1 C), temperature source Oral, resp. rate (!) 27, height 5\' 7"  (1.702 m), weight 71.1 kg, SpO2 96 %. Physical Exam  Vitals reviewed. Constitutional: He appears well-developed and well-nourished.  HENT:  Head: Normocephalic and atraumatic.  Eyes: Right eye exhibits no discharge. Left eye exhibits no discharge.  ?  Right gaze preference  Neck: No tracheal deviation present. No thyromegaly present.  Respiratory: Effort normal. No respiratory distress.  GI: Soft. He exhibits no distension.  Musculoskeletal:     Comments: No edema or tenderness in extremities  Neurological:  Patient is alert.   Makes eye contact with examiner.   Nonverbal and not following any commands  Skin: Skin is warm and dry.  Psychiatric:  Unable to assess due to mentation      Lab Results Last 24 Hours       Results for orders placed or performed during the hospital encounter of 06/02/19 (from the past 24 hour(s))  Basic metabolic panel     Status: Abnormal    Collection Time: 06/04/19  5:41 AM  Result Value Ref Range    Sodium 140 135 - 145 mmol/L    Potassium 3.8 3.5 - 5.1 mmol/L    Chloride 109 98 - 111 mmol/L    CO2 18 (L) 22 - 32 mmol/L    Glucose, Bld 112 (H) 70 - 99 mg/dL    BUN 15 6 - 20 mg/dL    Creatinine, Ser 1.610.87 0.61 - 1.24 mg/dL    Calcium 8.3 (L) 8.9 - 10.3 mg/dL    GFR calc non Af Amer >60 >  60 mL/min    GFR calc Af Amer >60 >60 mL/min    Anion gap 13 5 - 15  CBC     Status: Abnormal    Collection Time: 06/04/19  5:41 AM  Result Value Ref Range    WBC 19.9 (H) 4.0 - 10.5 K/uL    RBC 5.48 4.22 - 5.81 MIL/uL    Hemoglobin 16.7 13.0 - 17.0 g/dL    HCT 16.1 09.6 - 04.5 %    MCV 87.6 80.0 - 100.0 fL    MCH 30.5 26.0 - 34.0 pg    MCHC 34.8 30.0 - 36.0 g/dL    RDW 40.9 81.1 - 91.4 %    Platelets 210 150 - 400 K/uL    nRBC 0.0 0.0 - 0.2 %  Magnesium     Status: None    Collection Time: 06/04/19  5:41 AM  Result Value Ref Range    Magnesium 2.1 1.7 - 2.4 mg/dL        Imaging Results (Last 48 hours)  CT ANGIO HEAD W OR WO CONTRAST   Result Date: 06/02/2019 CLINICAL DATA:  Stroke suspected. EXAM: CT ANGIOGRAPHY HEAD AND NECK TECHNIQUE: Multidetector CT imaging of the head and neck was performed using the standard protocol during bolus administration of intravenous contrast. Multiplanar CT image reconstructions and MIPs were obtained to evaluate the vascular anatomy. Carotid stenosis measurements (when applicable) are obtained utilizing NASCET criteria, using the distal internal carotid diameter as the denominator. CONTRAST:  OMNIPAQUE IOHEXOL 350 MG/ML SOLN COMPARISON:  Brain MRI 06/02/2019. FINDINGS: CTA NECK FINDINGS Aortic arch: Standard aortic branching. There is an eccentric curvilinear filling defect within the lumen of aortic arch measuring 2.0 x 4.7 cm (series 7, image 344). Findings likely reflect eccentric thrombus. Additionally, there is occlusive thrombus within the proximal to mid innominate artery. There is reconstitution of flow at the level of the distal innominate artery. No significant proximal subclavian stenosis. Right carotid system: CCA and ICA patent within the neck without significant stenosis (50% or greater). Left carotid system: CCA and ICA patent within the neck without significant stenosis (50% or greater). Eccentric hypodensity within the mid left common carotid artery has an appearance most suggestive of soft plaque. Eccentric thrombus cannot be definitively excluded. Vertebral arteries: The vertebral artery is slightly dominant. The vertebral arteries are patent within the neck. Soft plaque at the vertebral artery origins bilaterally with mild ostial stenosis on the left Skeleton: No acute bony abnormality or aggressive osseous lesion. Other neck: No neck mass or cervical lymphadenopathy. Chronic fracture deformity of the T1 spinous process Upper chest: No consolidation within the imaged lung apices. Review of the MIP images confirms  the above findings CTA HEAD FINDINGS Anterior circulation: The intracranial internal carotid arteries are patent without significant stenosis. The M1 middle cerebral arteries are patent without significant stenosis. No M2 proximal branch occlusion or high-grade proximal stenosis is identified. The anterior cerebral arteries are patent without significant proximal stenosis. No intracranial aneurysm is identified. Posterior circulation: There is occlusive thrombus within the distal V4 right vertebral artery. Occlusive thrombus is also suspected within the distal V4 left vertebral artery. Occlusive thrombus extends into the proximal and mid basilar artery. There is reconstitution of flow within the distal basilar artery. Intermittent and irregular opacification of the right PICA is seen and this vessel likely a contains multifocal thrombus. Flow is seen within the proximal left PICA. The AICA vessels are not well delineated. Flow is seen within the superior cerebellar arteries  bilaterally. The posterior cerebral arteries are patent proximally without significant stenosis. However, emboli are suspected within the distal posterior cerebral arteries bilaterally given the pattern of acute infarcts demonstrated on brain MRI performed earlier the same day. Posterior communicating arteries are not definitively identified. Venous sinuses: Within limitations of contrast timing, no convincing thrombus. Anatomic variants: As described. Review of the MIP images confirms the above findings These results were called by telephone at the time of interpretation on 06/02/2019 at 7:11 pm to provider Doralee Albino , who verbally acknowledged these results. IMPRESSION: CTA neck: 1. Eccentric endoluminal thrombus within the aortic arch as described. 2. 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 3. The bilateral common and internal carotid arteries are patent within the neck without significant stenosis. Eccentric  hypodensity within the mid left common carotid artery has an appearance most suggestive of soft plaque. Eccentric thrombus at this site cannot be definitively excluded. 4. The vertebral arteries are patent within the neck bilaterally. Soft plaque at both vertebral artery origins with mild ostial stenosis on the left. CTA head: 1. Occlusive thrombus within the distal V4 right vertebral artery with occlusive thrombus extending into the proximal and mid basilar artery. Occlusive thrombus is also suspected within the very distal V4 left vertebral artery. There is reconstitution of flow within the distal basilar artery. 2. Only intermittent and irregular enhancement of the right PICA is seen and there is suspected multifocal thrombus within this vessel. 3. The bilateral AICAs are poorly delineated. 4. The posterior cerebral arteries are patent proximally without significant stenosis. However, emboli are suspected within the distal PCAs bilaterally given the pattern of acute infarcts demonstrated on same-day brain MRI. 5. No anterior circulation large vessel occlusion or proximal high-grade stenosis is identified. Electronically Signed   By: Jackey Loge DO   On: 06/02/2019 19:30    CT HEAD WO CONTRAST   Result Date: 06/04/2019 CLINICAL DATA:  Stroke follow-up EXAM: CT HEAD WITHOUT CONTRAST TECHNIQUE: Contiguous axial images were obtained from the base of the skull through the vertex without intravenous contrast. COMPARISON:  Brain MRI 06/02/2019 FINDINGS: Brain: Unchanged appearance of multifocal subacute infarcts scattered throughout the brain, involving both hemispheres, multiple vascular territories and the cerebellum. No hemorrhage or mass effect. Vascular: No hyperdense vessel or unexpected calcification. Skull: Normal. Negative for fracture or focal lesion. Sinuses/Orbits: No acute finding. Other: None. IMPRESSION: 1. Unchanged appearance of multifocal subacute infarcts scattered throughout the brain, involving  both hemispheres and the cerebellum. 2. No hemorrhage or mass effect. Electronically Signed   By: Deatra Robinson M.D.   On: 06/04/2019 00:09    CT ANGIO NECK W OR WO CONTRAST   Result Date: 06/02/2019 CLINICAL DATA:  Stroke suspected. EXAM: CT ANGIOGRAPHY HEAD AND NECK TECHNIQUE: Multidetector CT imaging of the head and neck was performed using the standard protocol during bolus administration of intravenous contrast. Multiplanar CT image reconstructions and MIPs were obtained to evaluate the vascular anatomy. Carotid stenosis measurements (when applicable) are obtained utilizing NASCET criteria, using the distal internal carotid diameter as the denominator. CONTRAST:  OMNIPAQUE IOHEXOL 350 MG/ML SOLN COMPARISON:  Brain MRI 06/02/2019. FINDINGS: CTA NECK FINDINGS Aortic arch: Standard aortic branching. There is an eccentric curvilinear filling defect within the lumen of aortic arch measuring 2.0 x 4.7 cm (series 7, image 344). Findings likely reflect eccentric thrombus. Additionally, there is occlusive thrombus within the proximal to mid innominate artery. There is reconstitution of flow at the level of the distal innominate artery. No  significant proximal subclavian stenosis. Right carotid system: CCA and ICA patent within the neck without significant stenosis (50% or greater). Left carotid system: CCA and ICA patent within the neck without significant stenosis (50% or greater). Eccentric hypodensity within the mid left common carotid artery has an appearance most suggestive of soft plaque. Eccentric thrombus cannot be definitively excluded. Vertebral arteries: The vertebral artery is slightly dominant. The vertebral arteries are patent within the neck. Soft plaque at the vertebral artery origins bilaterally with mild ostial stenosis on the left Skeleton: No acute bony abnormality or aggressive osseous lesion. Other neck: No neck mass or cervical lymphadenopathy. Chronic fracture deformity of the T1  spinous process Upper chest: No consolidation within the imaged lung apices. Review of the MIP images confirms the above findings CTA HEAD FINDINGS Anterior circulation: The intracranial internal carotid arteries are patent without significant stenosis. The M1 middle cerebral arteries are patent without significant stenosis. No M2 proximal branch occlusion or high-grade proximal stenosis is identified. The anterior cerebral arteries are patent without significant proximal stenosis. No intracranial aneurysm is identified. Posterior circulation: There is occlusive thrombus within the distal V4 right vertebral artery. Occlusive thrombus is also suspected within the distal V4 left vertebral artery. Occlusive thrombus extends into the proximal and mid basilar artery. There is reconstitution of flow within the distal basilar artery. Intermittent and irregular opacification of the right PICA is seen and this vessel likely a contains multifocal thrombus. Flow is seen within the proximal left PICA. The AICA vessels are not well delineated. Flow is seen within the superior cerebellar arteries bilaterally. The posterior cerebral arteries are patent proximally without significant stenosis. However, emboli are suspected within the distal posterior cerebral arteries bilaterally given the pattern of acute infarcts demonstrated on brain MRI performed earlier the same day. Posterior communicating arteries are not definitively identified. Venous sinuses: Within limitations of contrast timing, no convincing thrombus. Anatomic variants: As described. Review of the MIP images confirms the above findings These results were called by telephone at the time of interpretation on 06/02/2019 at 7:11 pm to provider Doralee Albino , who verbally acknowledged these results. IMPRESSION: CTA neck: 1. Eccentric endoluminal thrombus within the aortic arch as described. 2. 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 3. The  bilateral common and internal carotid arteries are patent within the neck without significant stenosis. Eccentric hypodensity within the mid left common carotid artery has an appearance most suggestive of soft plaque. Eccentric thrombus at this site cannot be definitively excluded. 4. The vertebral arteries are patent within the neck bilaterally. Soft plaque at both vertebral artery origins with mild ostial stenosis on the left. CTA head: 1. Occlusive thrombus within the distal V4 right vertebral artery with occlusive thrombus extending into the proximal and mid basilar artery. Occlusive thrombus is also suspected within the very distal V4 left vertebral artery. There is reconstitution of flow within the distal basilar artery. 2. Only intermittent and irregular enhancement of the right PICA is seen and there is suspected multifocal thrombus within this vessel. 3. The bilateral AICAs are poorly delineated. 4. The posterior cerebral arteries are patent proximally without significant stenosis. However, emboli are suspected within the distal PCAs bilaterally given the pattern of acute infarcts demonstrated on same-day brain MRI. 5. No anterior circulation large vessel occlusion or proximal high-grade stenosis is identified. Electronically Signed   By: Jackey Loge DO   On: 06/02/2019 19:30    MR BRAIN WO CONTRAST   Result Date: 06/04/2019 CLINICAL DATA:  Stroke follow-up EXAM: MRI HEAD WITHOUT CONTRAST TECHNIQUE: Multiplanar, multiecho pulse sequences of the brain and surrounding structures were obtained without intravenous contrast. COMPARISON:  Head CT 06/03/2019 Brain MRI 06/02/2019 FINDINGS: Brain: Numerous right cerebellar embolic infarcts are unchanged. Infarcts of the bilateral pons have slightly increased in size. Multifocal acute ischemia within both occipital lobes, the left thalamus, the left caudate nucleus and the right frontal white matter are unchanged. Multifocal white matter hyperintensity, most  commonly due to chronic ischemic microangiopathy. Normal volume of CSF spaces. No chronic microhemorrhage. Normal midline structures. Vascular: Normal flow voids. Skull and upper cervical spine: Normal marrow signal. Sinuses/Orbits: Negative. Other: None. IMPRESSION: 1. Slight increase in size of bilateral acute infarcts of the pons. Unchanged infarcts of the left thalamus, left caudate nucleus, right cerebellum and right frontal white matter. 2. No hemorrhage or mass effect. 3. Findings of chronic ischemic microangiopathy. Electronically Signed   By: Deatra Robinson M.D.   On: 06/04/2019 00:55    MR BRAIN WO CONTRAST   Result Date: 06/02/2019 CLINICAL DATA:  Speech difficulty. EXAM: MRI HEAD WITHOUT CONTRAST TECHNIQUE: Multiplanar, multiecho pulse sequences of the brain and surrounding structures were obtained without intravenous contrast. COMPARISON:  Noncontrast head CT 06/02/2019 FINDINGS: Brain: Please note the patient was unable to tolerate the full examination. As a result, postcontrast imaging could not be obtained. The acquired sequences are intermittently motion degraded. There are multiple small scattered foci of cortical and subcortical restricted diffusion within the bilateral cerebral hemispheres consistent with acute infarcts. These are most notably within the bilateral parietooccipital lobes, but also affect the bilateral frontal lobes. Additional small acute infarct within the left corona radiata. Multiple acute lacunar infarcts within the left thalamus. Multiple acute infarcts within the pons. Multiple acute infarcts within the right cerebellum. Corresponding T2/FLAIR hyperintensity at these sites. No significant mass effect. Chronic infarct within the right frontal lobe motor strip and high right parietal lobe. Background mild scattered T2/FLAIR hyperintensity within the cerebral white matter is nonspecific, but consistent with chronic small vessel ischemic disease. Mild generalized parenchymal  atrophy. There is no evidence of intracranial mass. No midline shift or extra-axial fluid collection. No chronic intracranial blood products. Vascular: No definite loss of expected proximal arterial flow voids. Skull and upper cervical spine: No focal marrow lesion. Sinuses/Orbits: Visualized orbits demonstrate no acute abnormality. Mild ethmoid and right maxillary sinus mucosal thickening. No significant mastoid effusion. IMPRESSION: The patient was unable to tolerate the full examination. As a result, postcontrast imaging could not be obtained. Numerous small supratentorial and infratentorial acute infarcts involving the cerebral hemispheres, left thalamus, pons and right cerebellar hemisphere. Given involvement of multiple vascular territories, findings are suggestive of an embolic process. Chronic right frontoparietal cortical infarcts. Background mild generalized parenchymal atrophy and chronic small vessel ischemic disease. Electronically Signed   By: Jackey Loge DO   On: 06/02/2019 15:24    MR CERVICAL SPINE WO CONTRAST   Result Date: 06/02/2019 CLINICAL DATA:  Spinal stenosis EXAM: MRI CERVICAL SPINE WITHOUT CONTRAST TECHNIQUE: Multiplanar, multisequence MR imaging of the cervical spine was performed. No intravenous contrast was administered. COMPARISON:  None. FINDINGS: Significant motion artifact is present Alignment: Trace degenerative listhesis. Vertebrae: Vertebral body heights appear preserved apart from mild degenerative endplate irregularity. No definite marrow edema. Cord: No definite abnormal signal. Posterior Fossa, vertebral arteries, paraspinal tissues: Small right cerebellar infarct. Refer to MRI brain earlier same day Disc levels: C2-C3: Small central disc protrusion indents the ventral thecal sac. Facet hypertrophy no significant canal  or foraminal stenosis C3-C4: Facet hypertrophy. No significant canal or foraminal stenosis. C4-C5: Probable left paracentral disc osteophyte complex. No  canal stenosis. Probable left foraminal stenosis. C5-C6: Disc bulge with endplate osteophytic ridging eccentric to the left. Probable mild canal stenosis. Probable bilateral foraminal stenosis. C6-C7: Disc bulge with endplate osteophytic ridging eccentric to the left. No significant canal or foraminal stenosis. C7-T1:  No significant canal or foraminal stenosis. IMPRESSION: Motion degraded study. There is no high-grade canal stenosis. Probable foraminal stenosis, which is difficult to evaluate in the setting of artifact. Electronically Signed   By: Guadlupe Spanish M.D.   On: 06/02/2019 18:15    DG Chest Port 1 View   Result Date: 06/02/2019 CLINICAL DATA:  Recent stroke-like symptoms EXAM: PORTABLE CHEST 1 VIEW COMPARISON:  04/07/2016 FINDINGS: Cardiac shadow is mildly enlarged but accentuated by the portable technique. Gastric catheter is noted within the stomach but could be advanced several cm deeper to allow for the proximal side port to lie within the gastric lumen. Lungs are well aerated bilaterally. Mild bibasilar atelectasis is seen. No acute bony abnormality is noted. IMPRESSION: Gastric catheter as described. Mild bibasilar atelectasis. Electronically Signed   By: Alcide Clever M.D.   On: 06/02/2019 21:34    DG Swallowing Func-Speech Pathology   Result Date: 06/04/2019 Objective Swallowing Evaluation: Type of Study: MBS-Modified Barium Swallow Study  Patient Details Name: GORJE IYER MRN: [id number] Date of Birth: 03-Apr-1965 Today's Date: 06/04/2019 Time: SLP Start Time (ACUTE ONLY): 1010 -SLP Stop Time (ACUTE ONLY): 1040 SLP Time Calculation (min) (ACUTE ONLY): 30 min Past Medical History: Past Medical History: Diagnosis Date . Tobacco abuse  Past Surgical History: No past surgical history on file. HPI: This is a 54 year old gentleman with sudden onset of dizziness followed by dysarthria and right-sided weakness.  MRI confirms both supratentorial and infratentorial bilateral scattered infarcts.   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 MRI brain-numerous small supratentorial and infratentorial acute infarcts involving the cerebral hemispheres, left thalamus, pons and right cerebellar hemisphere.  No data recorded Assessment / Plan / Recommendation CHL IP CLINICAL IMPRESSIONS 06/04/2019 Clinical Impression Pt presents with a primary moderate to severe oral dysphagia due to poor lingual fine motor coordination. Primary problem with the ability to both acheive posterior oral seal to prevent spillage to pharynx while also propelling bolus with appropriate propulsion as well as the inability to use to the tongue to manipulate a solid bolus for mastication. USe of nectar thick liquids improves time and cohesion of the bolus to reduce premature spillage and avoid delayed swallow intiation. With thins however there is posterior spillage of the bolus during propulsion efforts with sensed aspiration as laryngeal elevation initiates. Pt showed promise with a chin tuck maneuver (head turns ineffective), but due to intermittent crying behavior (PBA) pt could not sustain attention for thorough trials. Will attempt a chin tuck with thin at the bedside. For now, pt to initaite nectar thick liquids and pureed solids, preferably with consistent use of a straw. SLP Visit Diagnosis Dysphagia, oral phase (R13.11);Dysphagia, oropharyngeal phase (R13.12) Attention and concentration deficit following -- Frontal lobe and executive function deficit following -- Impact on safety and function Moderate aspiration risk   CH[facility] IP TREATMENT RECOMMENDATION 06/04/2019 Treatment Recommendations Therapy as outlined in treatment plan below   No flowsheet data found. [facility] IP DIET RECOMMENDATION 06/04/2019 SLP Diet Recommendations Dysphagia 1 (Puree) solids;Nectar thick liquid  Liquid Administration via Straw Medication Administration Crushed with puree  Compensations -- Postural Changes Remain semi-upright after after feeds/meals (Comment);Seated upright at 90 degrees   CHL IP OTHER RECOMMENDATIONS 06/04/2019 Recommended Consults -- Oral Care Recommendations Oral care BID Other Recommendations --   CHL IP FOLLOW UP RECOMMENDATIONS 06/04/2019 Follow up Recommendations Inpatient Rehab   CHL IP FREQUENCY AND DURATION 06/04/2019 Speech Therapy Frequency (ACUTE ONLY) min 2x/week Treatment Duration 2 weeks      CHL IP ORAL PHASE 06/04/2019 Oral Phase Impaired Oral - Pudding Teaspoon -- Oral - Pudding Cup -- Oral - Honey Teaspoon -- Oral - Honey Cup -- Oral - Nectar Teaspoon Decreased bolus cohesion Oral - Nectar Cup -- Oral - Nectar Straw WFL Oral - Thin Teaspoon -- Oral - Thin Cup -- Oral - Thin Straw Decreased bolus cohesion;Premature spillage;Delayed oral transit Oral - Puree Delayed oral transit;WFL Oral - Mech Soft -- Oral - Regular Weak lingual manipulation;Impaired mastication Oral - Multi-Consistency -- Oral - Pill -- Oral Phase - Comment --  CHL IP PHARYNGEAL PHASE 06/04/2019 Pharyngeal Phase Impaired Pharyngeal- Pudding Teaspoon -- Pharyngeal -- Pharyngeal- Pudding Cup -- Pharyngeal -- Pharyngeal- Honey Teaspoon -- Pharyngeal -- Pharyngeal- Honey Cup -- Pharyngeal -- Pharyngeal- Nectar Teaspoon Lateral channel residue Pharyngeal -- Pharyngeal- Nectar Cup -- Pharyngeal -- Pharyngeal- Nectar Straw Lateral channel residue Pharyngeal -- Pharyngeal- Thin Teaspoon -- Pharyngeal -- Pharyngeal- Thin Cup Lateral channel residue;Penetration/Aspiration during swallow;Delayed swallow initiation-pyriform sinuses Pharyngeal Material enters airway, passes BELOW cords and not ejected out despite cough attempt by patient Pharyngeal- Thin Straw Lateral channel residue;Penetration/Aspiration before swallow;Delayed swallow initiation-pyriform sinuses Pharyngeal Material enters airway, passes BELOW cords and not ejected out despite cough attempt by patient Pharyngeal- Puree Lateral  channel residue Pharyngeal -- Pharyngeal- Mechanical Soft -- Pharyngeal -- Pharyngeal- Regular Lateral channel residue Pharyngeal -- Pharyngeal- Multi-consistency -- Pharyngeal -- Pharyngeal- Pill -- Pharyngeal -- Pharyngeal Comment --  No flowsheet data found. Harlon Ditty, MA CCC-SLP Acute Rehabilitation Services Pager [phone] Office [phone] Claudine Mouton 06/04/2019, 11:28 AM               EEG adult   Result Date: 06/03/2019 Charlsie Quest, MD     06/03/2019 11:56 AM Patient Name: PAYTON PRINSEN MRN: [id number] Epilepsy Attending: Charlsie Quest Referring Physician/Provider: Dr. Orpah Cobb Date: 06/02/2019 Duration: 30.13 minutes Patient history: 54 year old male present with sudden onset of dizziness and dysarthria as well as right-sided weakness.  EEG evaluate for seizures. Level of alertness: Awake AEDs during EEG study: None Technical aspects: This EEG study was done with scalp electrodes positioned according to the 10-20 International system of electrode placement. Electrical activity was acquired at a sampling rate of 500Hz  and reviewed with a high frequency filter of 70Hz  and a low frequency filter of 1Hz . EEG data were recorded continuously and digitally stored. Description: The posterior dominant rhythm consists of 8 Hz activity of moderate voltage (25-35 uV) seen predominantly in posterior head regions, symmetric and reactive to eye opening and eye closing. Hyperventilation and photic stimulation were not performed. IMPRESSION: This study is within normal limits. No seizures or epileptiform discharges were seen throughout the recording. Charlsie Quest    ECHOCARDIOGRAM COMPLETE   Result Date: 06/03/2019    ECHOCARDIOGRAM REPORT   Patient Name:   KOICHI PLATTE Date of Exam: 06/03/2019 Medical Rec #:  [id number]     Height:       67.0 in Accession #:    [id number]    Weight:  156.7 lb Date of Birth:  1966-01-26      BSA:          1.823 m Patient Age:    53 years       BP:           151/97 mmHg Patient Gender: M             HR:           89 bpm. Exam Location:  Inpatient Procedure: 2D Echo Indications:    Stroke 434.91/ I163.9  History:        Patient has no prior history of Echocardiogram examinations. No                 prior cardiac.  Sonographer:    Leeroy Bock Turrentine Referring Phys: Cecilio Asper A HENSEL IMPRESSIONS  1. Left ventricular ejection fraction, by estimation, is 65 to 70%. The left ventricle has normal function. The left ventricle has no regional wall motion abnormalities. There is mild concentric left ventricular hypertrophy. Left ventricular diastolic parameters are consistent with Grade I diastolic dysfunction (impaired relaxation).  2. Right ventricular systolic function is normal. The right ventricular size is normal.  3. The mitral valve is normal in structure. No evidence of mitral valve regurgitation. No evidence of mitral stenosis.  4. The aortic valve is normal in structure. Aortic valve regurgitation is not visualized. No aortic stenosis is present.  5. The inferior vena cava is normal in size with greater than 50% respiratory variability, suggesting right atrial pressure of 3 mmHg. Conclusion(s)/Recommendation(s): No intracardiac source of embolism detected on this transthoracic study. A transesophageal echocardiogram is recommended to exclude cardiac source of embolism if clinically indicated. FINDINGS  Left Ventricle: Left ventricular ejection fraction, by estimation, is 65 to 70%. The left ventricle has normal function. The left ventricle has no regional wall motion abnormalities. The left ventricular internal cavity size was normal in size. There is  mild concentric left ventricular hypertrophy. Left ventricular diastolic parameters are consistent with Grade I diastolic dysfunction (impaired relaxation). Normal left ventricular filling pressure. Right Ventricle: The right ventricular size is normal. No increase in right ventricular wall thickness.  Right ventricular systolic function is normal. Left Atrium: Left atrial size was normal in size. Right Atrium: Right atrial size was normal in size. Pericardium: Trivial pericardial effusion is present. The pericardial effusion is posterior to the left ventricle. There is no evidence of cardiac tamponade. Mitral Valve: The mitral valve is normal in structure. Normal mobility of the mitral valve leaflets. No evidence of mitral valve regurgitation. No evidence of mitral valve stenosis. Tricuspid Valve: The tricuspid valve is normal in structure. Tricuspid valve regurgitation is trivial. No evidence of tricuspid stenosis. Aortic Valve: The aortic valve is normal in structure. Aortic valve regurgitation is not visualized. No aortic stenosis is present. Pulmonic Valve: The pulmonic valve was normal in structure. Pulmonic valve regurgitation is not visualized. No evidence of pulmonic stenosis. Aorta: The aortic root is normal in size and structure. Venous: The inferior vena cava is normal in size with greater than 50% respiratory variability, suggesting right atrial pressure of 3 mmHg. IAS/Shunts: No atrial level shunt detected by color flow Doppler.  LEFT VENTRICLE PLAX 2D LVIDd:         4.20 cm  Diastology LVIDs:         2.90 cm  LV e' lateral:   6.74 cm/s LV PW:         1.10 cm  LV E/e' lateral:  10.0 LV IVS:        1.10 cm  LV e' medial:    6.42 cm/s LVOT diam:     2.00 cm  LV E/e' medial:  10.5 LV SV:         65 LV SV Index:   36 LVOT Area:     3.14 cm  RIGHT VENTRICLE RV S prime:     16.20 cm/s TAPSE (M-mode): 3.0 cm LEFT ATRIUM             Index       RIGHT ATRIUM          Index LA diam:        2.55 cm 1.40 cm/m  RA Area:     9.92 cm LA Vol (A2C):   15.8 ml 8.67 ml/m  RA Volume:   18.30 ml 10.04 ml/m LA Vol (A4C):   19.0 ml 10.42 ml/m LA Biplane Vol: 18.4 ml 10.09 ml/m  AORTIC VALVE LVOT Vmax:   129.00 cm/s LVOT Vmean:  84.100 cm/s LVOT VTI:    0.207 m  AORTA Ao Root diam: 2.90 cm Ao Asc diam:  3.00 cm MITRAL  VALVE MV Area (PHT): 3.50 cm     SHUNTS MV Decel Time: 217 msec     Systemic VTI:  0.21 m MV E velocity: 67.30 cm/s   Systemic Diam: 2.00 cm MV A velocity: 101.00 cm/s MV E/A ratio:  0.67 Tobias Alexander MD Electronically signed by Tobias Alexander MD Signature Date/Time: 06/03/2019/8:22:00 PM    Final        Assessment/Plan: Diagnosis: Bilateral infarcts Stroke: Continue secondary stroke prophylaxis and Risk Factor Modification listed below:   Antiplatelet therapy:   Blood Pressure Management:  Continue current medication with prn's with permisive HTN per primary team Statin Agent:   Prediabetes management:   Tobacco abuse:   ?  Right >left hemiparesis: fit for orthosis to prevent contractures (resting hand splint for day, wrist cock up splint at night, PRAFO, etc) PT/OT for mobility, ADL training  Motor recovery: Fluoxetine (not on Plavix at present) Labs and images (see above) independently reviewed.  Records reviewed and summated above.   1. Does the need for close, 24 hr/day medical supervision in concert with the patient's rehab needs make it unreasonable for this patient to be served in a less intensive setting? Yes 2. Co-Morbidities requiring supervision/potential complications: Chronic low back pain (Biofeedback training with therapies to help reduce reliance on opiate pain medications, monitor pain control during therapies, and sedation at rest and titrate to maximum efficacy to ensure participation and gains in therapies), tobacco abuse (counsel and appropriate), leukocytosis (repeat labs, cont to monitor for signs and symptoms of infection, further workup if indicated), prediabetes (Monitor in accordance with exercise and adjust meds as necessary) 3. Due to bladder management, bowel management, safety, disease management, medication administration, pain management and patient education, does the patient require 24 hr/day rehab nursing? Yes 4. Does the patient require coordinated care  of a physician, rehab nurse, therapy disciplines of PT/OT/SLP to address physical and functional deficits in the context of the above medical diagnosis(es)? Yes Addressing deficits in the following areas: balance, endurance, locomotion, strength, transferring, bathing, dressing, toileting, cognition, speech, language, swallowing and psychosocial support 5. Can the patient actively participate in an intensive therapy program of at least 3 hrs of therapy per day at least 5 days per week? Potentially 6. The potential for patient to make measurable gains while on inpatient rehab is excellent 7.  Anticipated functional outcomes upon discharge from inpatient rehab are ?min assist  with PT, ?min assist with OT, ?supervision with SLP. 8. Estimated rehab length of stay to reach the above functional goals is: 18-22 days, potentially sooner based on family reports of engagement and movement. 9. Anticipated discharge destination: Home 10. Overall Rehab/Functional Prognosis: good   RECOMMENDATIONS: This patient's condition is appropriate for continued rehabilitative care in the following setting: CIR once medically stable and work-up complete. Patient has agreed to participate in recommended program. Potentially Note that insurance prior authorization may be required for reimbursement for recommended care.   Comment: Rehab Admissions Coordinator to follow up.   I have personally performed a face to face diagnostic evaluation, including, but not limited to relevant history and physical exam findings, of this patient and developed relevant assessment and plan.  Additionally, I have reviewed and concur with the physician assistant's documentation above.    Delice Lesch, MD, ABPMR Lavon Paganini Angiulli, PA-C 06/04/2019

## 2019-06-15 NOTE — Consult Note (Signed)
Neuropsychological Consultation   Patient:   Adrian [name]   DOB:   1965-08-13  MR Number:  [id number]  Location:  [place] [facility] [place] [id number] [place] [address] Dept: [place]: [phone]           Date of Service:   06/15/2019  Start Time:   1 PM End Time:   2 PM  Provider/Observer:  Ilean Skill, Psy.D.       Clinical Neuropsychologist       Billing Code/Service: 69485  Chief Complaint:    Adrian Meuth. [name] is a 54 year old male with history of tobacco abuse and chronic low back pain maintained on Neurontin 600 mg 3 times a day as well as prednisone pack.  Patient with severe speech deficits both expressive and receptive as well as cognitive deficits.  Patient presented on 06/02/2019 with dysarthria and quadriparesis.  CT showed multiple bilateral infarcts.  Numerous small supratentorial and infratentorial acute infarcts involving both cerebral hemispheres, left thalamus, pons and right cerebellar hemisphere.  Chronic right frontoparietal cortical infarcts were also noted.  Follow-up MRI showed increase in size of bilateral acute infarcts of the pons.  Unchanged infarcts in the left thalamus, left caudate nucleus, right cerebellum and right frontal white matter.  There was no hemorrhage or mass-effect.  Patient did not receive TPA.  The patient has severe expressive aphasia and while the patient was only able to nod his head to indicate yes or no responses the family does report that there are times when he is able to get some more complete utterances out but they are limited and broken.  Patient has had a great deal of reactive mood states and indications of anxiety and depressive responses particularly when challenged with his severe motor deficits and limitations likely related to thalamic, pons and cortical infarcts along with his cerebellar infarcts.  Reason for Service:  The patient was  referred for neuropsychological consultation due to severe degree of cognitive and motor deficits and severe expressive language deficits along with significant mood state issues related to anxiety and depressive responses.  Below is the HPI for the current admission.  IOE:VOJJK R. [name] is a 54 year old right-handed male with history of tobacco abuse and chronic low back pain maintained on Neurontin 600 mg 3 times dailyas well as prednisone pack.. History taken from chart review wife and son due to patient speech and cognition. Independent prior to admission. Presented 06/02/2019 with dysarthria andquadriparesis. Admission chemistries with WBC 21,000, BUN 24, urine drug screen negative. CT showed multiple bilateral infarcts. Per report numerous small supratentorial and infratentorial acute infarcts involving the cerebral hemispheres, left thalamus, pons and right cerebellar hemisphere. Chronic right frontoparietal cortical infarcts. Follow-up MRI showed slight increase in size of bilateral acute infarcts of the pons. Unchanged infarcts of the left thalamus left caudate nucleus, right cerebellum and right frontal white matter. No hemorrhage or mass-effect.. Patient did not receive TPA. 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 Occlusive thrombus also suspected within the very distal V4 left vertebral artery. Echocardiogram with ejection fraction of 09% grade 1 diastolic dysfunction. EEG negative for seizure.CTA head and neck repeated 06/09/2019 unchanged multivessel occlusion. Recommendations are DAPT of aspirin 325 mg and Plavix 75 mg daily. Patient had initially been maintained on intravenous heparin which is since been discontinued and  currently maintained on Lovenox. Dysphagia #1 honey thick liquid diet monitoring of hydration and still with ongoing secretion  management. Patient very labile as well as anxious and placed on Wellbutrin as well as the addition of Seroquel as needed. Bouts of orthostasis initially and did require fluid resuscitation. His Levophed has been discontinued. Noted leukocytosis as patient had been on initial prednisone x4 days prior to admission for chronic back painsince discontinued latest WBC 19,400 and latest chest x-ray no focal consolidation.Therapy evaluations completed and patient was admitted for a comprehensive rehab program.  Current Status:  The patient showed severe and significant cognitive and neurobehavioral deficits.  Severe motor deficits essentially requiring max assist for any type of motor movement with little core strength or core control.  During the interview today the patient did not show any motor function movement although there was none required.  He was able to maintain eye contact some of the time but even eye contact was not sustained.  The patient reported that he is able to understand what others are saying and he did indicate comprehension of my questions.  However, there was little expressive responses back and the patient generally simply tried to nod yes or no.  Given the level of severe motor deficits it is hard to truly assess his potential for expressive language as there are so many varying aspects that could be contributing to his motor and speech deficits given the nature and severity of his bilateral, midbrain, cerebellar strokes.  The patient also had a likely pre-existing frontoparietal stroke.  The patient is described during therapy sessions at times is having crying spells and depressive reactions when faced with his severe and profound residual deficits.  Given the level of deficits these do actually appear to be appropriate mood responses.  Because of his severe expressive language deficits and motor deficits it was impossible to get any more structured neuropsychological assessment  conducted.  Behavioral Observation: ERCEL PEPITONE  presents as a 54 y.o.-year-old Right Caucasian Male who appeared his stated age. his dress was Appropriate and he was Well Groomed and his manners were Appropriate to the situation.  his participation was indicative of Appropriate and Inattentive behaviors.  There were profound physical disabilities noted.  he displayed an appropriate level of cooperation and motivation.     Interactions:    Minimal Inattentive  Attention:   abnormal and attention span appeared shorter than expected for age  Memory:   abnormal; global memory impairment noted  Visuo-spatial:  not examined  Speech (Volume):  low  Speech:   non-fluent aphasia;   Thought Process:  Circumstantial  Though Content:  WNL;   Orientation:   person  Judgment:   Poor  Planning:   Poor  Affect:    Appropriate  Mood:    Depressed  Insight:   Shallow  Intelligence:   normal  Medical History:   Past Medical History:  Diagnosis Date  . Tobacco abuse    Psychiatric History:  No prior psychiatric history noted  Family Med/Psych History:  Family History  Problem Relation Age of Onset  . Hypertension Mother   . Hypertension Father      Impression/DX:  Jacorie Ernsberger. Mcintyre is a 54 year old male with history of tobacco abuse and chronic low back pain maintained on Neurontin 600 mg 3 times a day as well as prednisone pack.  Patient with severe speech deficits both expressive and receptive as well as cognitive deficits.  Patient presented on 06/02/2019 with  dysarthria and quadriparesis.  CT showed multiple bilateral infarcts.  Numerous small supratentorial and infratentorial acute infarcts involving both cerebral hemispheres, left thalamus, pons and right cerebellar hemisphere.  Chronic right frontoparietal cortical infarcts were also noted.  Follow-up MRI showed increase in size of bilateral acute infarcts of the pons.  Unchanged infarcts in the left thalamus, left caudate  nucleus, right cerebellum and right frontal white matter.  There was no hemorrhage or mass-effect.  Patient did not receive TPA.  The patient has severe expressive aphasia and while the patient was only able to nod his head to indicate yes or no responses the family does report that there are times when he is able to get some more complete utterances out but they are limited and broken.  Patient has had a great deal of reactive mood states and indications of anxiety and depressive responses particularly when challenged with his severe motor deficits and limitations likely related to thalamic, pons and cortical infarcts along with his cerebellar infarcts.  The patient showed severe and significant cognitive and neurobehavioral deficits.  Severe motor deficits essentially requiring max assist for any type of motor movement with little core strength or core control.  During the interview today the patient did not show any motor function movement although there was none required.  He was able to maintain eye contact some of the time but even eye contact was not sustained.  The patient reported that he is able to understand what others are saying and he did indicate comprehension of my questions.  However, there was little expressive responses back and the patient generally simply tried to nod yes or no.  Given the level of severe motor deficits it is hard to truly assess his potential for expressive language as there are so many varying aspects that could be contributing to his motor and speech deficits given the nature and severity of his bilateral, midbrain, cerebellar strokes.  The patient also had a likely pre-existing frontoparietal stroke.  The patient is described during therapy sessions at times is having crying spells and depressive reactions when faced with his severe and profound residual deficits.  Given the level of deficits these do actually appear to be appropriate mood responses.  Because of his severe  expressive language deficits and motor deficits it was impossible to get any more structured neuropsychological assessment conducted.  Disposition/Plan:  Will follow up with the patient either at the end of this week or first of next week to further assess any potential cognitive and neurobehavioral changes.  Diagnosis:    Cardioembolic stroke [facility]) - Plan: Ambulatory referral to Neurology  Lumbago - Plan: DG Lumbar Spine 2-3 Views, DG Lumbar Spine 2-3 Views         Electronically Signed   _______________________ Arley Phenix, Psy.D.

## 2019-06-15 NOTE — Progress Notes (Signed)
Physical Therapy Session Note  Patient Details  Name: Adrian [name] MRN: [id number] Date of Birth: May 07, 1965  Today's Date: 06/15/2019 PT Individual Time: 4196-2229 PT Individual Time Calculation (min): 64 min   Short Term Goals: Week 1:  PT Short Term Goal 1 (Week 1): Pt will perform bed<>chair transfer with Max assist +2 PT Short Term Goal 2 (Week 1): Pt will maintain static sitting balance EOB with UE support and Mod assist +1 PT Short Term Goal 3 (Week 1): Pt will tolerate x 5 minutes in the standing frame for LE weightbearing and standing tolerance, dependent assist.  Skilled Therapeutic Interventions/Progress Updates:     Pt received supine in bed and agreeable to therapy. Shakes head "no" in response to pain. Bilateral rolling with maxA +2 to position maxislide beneath pt in supine. Maxislide used to transfer pt to tilt table with dependent +2 transfer. Tilt table utilized to provide WB through BLEs to manage tone and provided sensory feedback, increase pt tolerance to upright activity, and to help mobilize respiratory secretions. BP in supine 139/86 and at 50 degree upright BP at 128/82. Verbal and tactile cues to extend knees and feet positioned for optimal WB. Cues for head position and midline trunk positioning. Pt tolerates tilt table up to ~75 degrees. Pt's IV dislodges when upright and so had to return to supine with RN present for dressing application.  Pt transfers back to bed with maxislide and same assist level. Supine>sit with dependent +2 assist. Pt works on sitting balance at EOB. Initially requires minA/modA for static sitting balance but with elbows propped on tray table pt is able to maintain static sitting for ~30 seconds at a time. Cues for head movement and practice moving trunk anteriorly and posteriorly.   Sit>supine with dependent +2 assist and pt left supine in bed with alarm intact and family present.  Therapy Documentation Precautions:   Precautions Precautions: Fall, Other (comment) Precaution Comments: check BP on left arm due to occlusion on R side. Restrictions Weight Bearing Restrictions: No    Therapy/Group: Individual Therapy  Beau Fanny, PT, DPT 06/15/2019, 4:01 PM

## 2019-06-15 NOTE — Progress Notes (Signed)
This nurse attempted to have patient use incentive spirometer. Patient refused.

## 2019-06-15 NOTE — Progress Notes (Addendum)
Occupational Therapy Session Note  Patient Details  Name: Adrian [name] MRN: [id number] Date of Birth: 11-02-1965  Today's Date: 06/15/2019 OT Individual Time: 6222-9798 OT Individual Time Calculation (min): 70 min    Short Term Goals: Week 1:  OT Short Term Goal 1 (Week 1): patient will increase ability to roll right/left with max A of one OT Short Term Goal 2 (Week 1): patient will improve grasp (right or left) to allow him to hold / manipulate built up handled utencils OT Short Term Goal 3 (Week 1): patient will improve trunk mobility to increase sitting edge of bed balance with mod A of one  Skilled Therapeutic Interventions/Progress Updates:    Pt completed rolling in bed in order to donn his shorts with total assist +2 (pt 10%).  He then transitioned to sitting with total assist +2 (pt 10%).  He then worked on Secretary/administrator with total assist +2 (pt 10%) in unsupported sitting.  He next completed transfer to the wheelchair with total assist +2 (pt 10%) via sliding board.  After working on taking his medicine, he was then taken down to the therapy gym where he transferred to the therapy mat at the same level.  Worked on sitting balance on the mat with overall total assist.  Positioned BUEs on bedside table and he was able to balance for intervals of time less than one minute with min guard.  The longer he sat up however, he would fall forward without ability to use his UEs or trunk to compensate and come back to sitting.  Worked on initiation of head flexion and extension to activate core muscles while sitting.  Pt still needing max assist to correct any LOB at this time.  Finished session with transfer back to the room via wheelchair and pt left sitting up in the wheelchair with spouse and pt's son in the room.  Discussed importance of working on sitting tolerance and demonstrated to them how to complete pressure relief every 45 mins to 1 hour using the tilt in space wheelchair.  They  will need further hands on as therapist only demonstrated this and did not have them practice secondary to decreased time, and only wanting pt to sit up for one hour this session.      Therapy Documentation Precautions:  Precautions Precautions: Fall, Other (comment) Precaution Comments: check BP on left arm due to occlusion on R side. Restrictions Weight Bearing Restrictions: No   Pain: Pain Assessment Pain Scale: Faces Faces Pain Scale: Hurts little more Pain Type: Chronic pain Pain Location: Back Pain Orientation: Lower Pain Descriptors / Indicators: Discomfort Pain Onset: With Activity Pain Intervention(s): Repositioned Multiple Pain Sites: No ADL: See Care Tool Section for some details of mobility and selfcare  Therapy/Group: Individual Therapy  Tinna Kolker OTR/L 06/15/2019, 12:47 PM

## 2019-06-15 NOTE — Progress Notes (Signed)
Family refused CPT 

## 2019-06-15 NOTE — IPOC Note (Signed)
Overall Plan of Care [facility]) Patient Details Name: RAMIZ TURPIN MRN: [id number] DOB: May 08, 1965  Admitting Diagnosis: Basilar artery occlusion  Hospital Problems: Principal Problem:   Basilar artery occlusion Active Problems:   Cardioembolic stroke ([place])   Labile blood pressure   Dysphagia, post-stroke     Functional Problem List: Nursing Behavior, Bladder, Bowel, Edema, Safety, Nutrition, Medication Management, Skin Integrity  PT Balance, Safety, Behavior, Sensory, Skin Integrity, Endurance, Motor, Nutrition, Pain, Perception  OT Balance, Endurance, Motor, Pain, Perception, Safety, Sensory, Skin Integrity  SLP Cognition, Linguistic, Nutrition  TR         Basic ADL's: OT Eating, Grooming, Bathing, Dressing, Toileting     Advanced  ADL's: OT       Transfers: PT Bed Mobility, Bed to Chair, Car, Manufacturing systems engineer, Metallurgist: PT Ambulation, Emergency planning/management officer     Additional Impairments: OT Fuctional Use of Upper Extremity  SLP Swallowing, Communication, Social Cognition expression Problem Solving, Attention, Awareness  TR      Anticipated Outcomes Item Anticipated Outcome  Self Feeding min A  Swallowing  Min A   Basic self-care  mod A  Toileting  mod A   Bathroom Transfers min/mod A  Bowel/Bladder  mod assist  Transfers  min-mod assist  Locomotion  max assist  Communication  Mod A  Cognition  Mod A  Pain  less than 3 out of 10  Safety/Judgment  min assist   Therapy Plan: PT Intensity: Minimum of 1-2 x/day ,45 to 90 minutes PT Frequency: 5 out of 7 days PT Duration Estimated Length of Stay: 4 weeks OT Intensity: Minimum of 1-2 x/day, 45 to 90 minutes OT Frequency: 5 out of 7 days OT Duration/Estimated Length of Stay: 4 weeks SLP Intensity: Minumum of 1-2 x/day, 30 to 90 minutes SLP Frequency: 3 to 5 out of 7 days SLP Duration/Estimated Length of Stay: ~4 weeks   Due to the current state of emergency, patients may not be  receiving their 3-hours of Medicare-mandated therapy.   Team Interventions: Nursing Interventions Patient/Family Education, Bladder Management, Bowel Management, Pain Management, Medication Management, Skin Care/Wound Management, Dysphagia/Aspiration Precaution Training, Discharge Planning  PT interventions Ambulation/gait training, Community reintegration, DME/adaptive equipment instruction, Neuromuscular re-education, Psychosocial support, Stair training, UE/LE Strength taining/ROM, Wheelchair propulsion/positioning, Training and development officer, Discharge planning, Pain management, Functional electrical stimulation, Skin care/wound management, Therapeutic Activities, UE/LE Coordination activities, Cognitive remediation/compensation, Disease management/prevention, Functional mobility training, Patient/family education, Splinting/orthotics, Therapeutic Exercise, Visual/perceptual remediation/compensation  OT Interventions Balance/vestibular training, Neuromuscular re-education, Self Care/advanced ADL retraining, Therapeutic Exercise, Wheelchair propulsion/positioning, DME/adaptive equipment instruction, Pain management, Skin care/wound managment, UE/LE Strength taining/ROM, Community reintegration, Barrister's clerk education, Splinting/orthotics, UE/LE Coordination activities, Discharge planning, Functional mobility training, Psychosocial support, Therapeutic Activities  SLP Interventions Cognitive remediation/compensation, Cueing hierarchy, Dysphagia/aspiration precaution training, Functional tasks, Patient/family education, Environmental controls, Internal/external aids, Speech/Language facilitation, Multimodal communication approach  TR Interventions    SW/CM Interventions Discharge Planning, Disease Management/Prevention, Psychosocial Support, Patient/Family Education   Barriers to Discharge MD  Medical stability and Nutritional means  Nursing Wound Care, Nutrition means, Incontinence    PT Decreased  caregiver support, Inaccessible home environment, Behavior    OT      SLP      SW Home environment access/layout, Decreased caregiver support Trailer with steps to entry/wife working on ramp for entry/wife has physical problems and is disabled   Team Discharge Planning: Destination: PT-Home ,OT- Home , SLP-Home Projected Follow-up: PT-Home health PT, OT-  Home health OT, SLP-Home Health SLP,  24 hour supervision/assistance Projected Equipment Needs: PT-To be determined, OT- To be determined, SLP-None recommended by SLP Equipment Details: PT- , OT-to be determined pending progress Patient/family involved in discharge planning: PT- Patient unable/family or caregiver not available,  OT-Patient, Family member/caregiver, SLP-Patient, Family member/caregiver  MD ELOS: 22-25d Medical Rehab Prognosis:  Guarded Assessment:  54 year old right-handed male with history of tobacco abuse and chronic low back pain maintained on Neurontin 600 mg 3 times dailyas well as prednisone pack.. History taken from chart review wife and son due to patient speech and cognition. Independent prior to admission. Presented 06/02/2019 with dysarthria andquadriparesis. Admission chemistries with WBC 21,000, BUN 24, urine drug screen negative. CT showed multiple bilateral infarcts. Per report numerous small supratentorial and infratentorial acute infarcts involving the cerebral hemispheres, left thalamus, pons and right cerebellar hemisphere. Chronic right frontoparietal cortical infarcts. Follow-up MRI showed slight increase in size of bilateral acute infarcts of the pons. Unchanged infarcts of the left thalamus left caudate nucleus, right cerebellum and right frontal white matter. No hemorrhage or mass-effect.. Patient did not receive TPA. 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 Occlusive  thrombus also suspected within the very distal V4 left vertebral artery. Echocardiogram with ejection fraction of 70% grade 1 diastolic dysfunction. EEG negative for seizure.CTA head and neck repeated 06/09/2019 unchanged multivessel occlusion. Recommendations are DAPT of aspirin 325 mg and Plavix 75 mg daily. Patient had initially been maintained on intravenous heparin which is since been discontinued and currently maintained on Lovenox. Dysphagia #1 honey thick liquid diet monitoring of hydration and still with ongoing secretion management. Patient very labile as well as anxious and placed on Wellbutrin as well as the addition of Seroquel as needed. Bouts of orthostasis initially and did require fluid resuscitation. His Levophed has been discontinued. Noted leukocytosis as patient had been on initial prednisone x4 days prior to admission for chronic back painsince discontinued latest WBC 19,400 and latest chest x-ray no focal consolidation.   Now requiring 24/7 Rehab RN,MD, as well as CIR level PT, OT and SLP.  Treatment team will focus on ADLs and mobility with goals set at Min/Mod a See Team Conference Notes for weekly updates to the plan of care

## 2019-06-15 NOTE — Progress Notes (Signed)
Speech Language Pathology Daily Session Note  Patient Details  Name: Adrian [name] MRN: [id number] Date of Birth: Jul 17, 1965  Today's Date: 06/15/2019 SLP Individual Time: 3419-6222 SLP Individual Time Calculation (min): 55 min  Short Term Goals: Week 1: SLP Short Term Goal 1 (Week 1): Pt will demonstrated efficient mastication and oral clearance of puree solids with Max A multimodal cueing for use of compensaotry swallow strategies. SLP Short Term Goal 2 (Week 1): Pt will consume therapeutic trials of ice and/or thin H2O with adequate oral containment and minimal overt s/sx aspiration with Max A multimodal cues for accurate use of chin tuck maneuver. SLP Short Term Goal 3 (Week 1): Pt communicate basic wants and needs via multimodal means with Max A cues. SLP Short Term Goal 4 (Week 1): Pt will verbalize at the word level to achieve 25% intelligibility with Max A multimodal cueing for intelligibility strategies. SLP Short Term Goal 5 (Week 1): Pt will sustain attention to functional tasks for 3-5 minutes with Max A multimodal cues. SLP Short Term Goal 6 (Week 1): Pt will demonstrate ability to initiate and problem solve basic familiar tasks with Max A multimodal cueing.  Skilled Therapeutic Interventions: Pt was seen for skilled ST targeting dysphagia and speech. SLP provided Total A for self-feeding of Dys 1 (puree) breakfast with honey thick liquids. Pt with baseline congested intermittent coughing, but none that appeared linked to PO intake throughout breakfast. Pt's lingual manipulation of boluses remains very weak, requiring extra time for AP transit. Pt requires Max A to communicate his basic wants and needs at this time, with most success when question can be rephrased into yes/no format. Max A demonstration cues required for distinct head shake yes/no to questions. Pt attempting to communicate word and phrase level messages, however minimally responsive to multimodal cues for implementation  of overarticulation and pacing for increased intelligibility. SLP also facilitated session with Max A multimodal cues for accuracy in use of Phillips EMST device set to 5cm H2O targeting strengthening pt's very weak cough, voice, and diaphragmatic breathing for breath support. Pt did require support to hold the device and achieve lip seal as well as demonstration cues to exhalation technique, but able to complete 5 reps of EMST with device set at 5cm H2O resistance. Increase in congested cough response noted after EMST exercises, however pt unable to orally expectorate any secretions. Pt left laying in bed with alarm set and needs within reach. Continue per current plan of care.          Pain Pain Assessment Pain Scale: Faces Faces Pain Scale: Hurts little more Pain Type: Acute pain Pain Location: Back Pain Orientation: Lower Pain Descriptors / Indicators: Grimacing Pain Onset: On-going Pain Intervention(s): Repositioned Multiple Pain Sites: No  Therapy/Group: Individual Therapy  [name] 06/15/2019, 7:01 AM

## 2019-06-15 NOTE — Progress Notes (Signed)
[facility] PHYSICAL MEDICINE & REHABILITATION PROGRESS NOTE   Subjective/Complaints: Patient remains severely dysarthric.  Unable to communicate.  Unable to do that gestures  ROS- unable to eval due to aphasia/dysarthria  Objective:   DG Lumbar Spine 2-3 Views  Result Date: 06/13/2019 CLINICAL DATA:  Low back pain EXAM: LUMBAR SPINE - 2-3 VIEW COMPARISON:  None. FINDINGS: Colonic contrast is seen.  Colonic diverticuli. Evaluation of the lumbar spine on the lateral view is obscured by overlapping contrast filled colon. Within this limitation, no traumatic malalignment or fracture seen. Mild multilevel degenerative disc disease is noted. IMPRESSION: 1. Contrast in the colon limits evaluation lumbar spine on the lateral view. No fracture or traumatic malalignment seen within these limitations. Mild degenerative changes. Electronically Signed   By: Dorise Bullion III M.D   On: 06/13/2019 14:15   Recent Labs    06/14/19 0652  WBC 14.7*  HGB 13.6  HCT 42.6  PLT 302   Recent Labs    06/14/19 0652  NA 142  K 4.0  CL 110  CO2 23  GLUCOSE 115*  BUN 21*  CREATININE 0.78  CALCIUM 9.0    Intake/Output Summary (Last 24 hours) at 06/15/2019 0902 Last data filed at 06/15/2019 0447 Gross per 24 hour  Intake 1150 ml  Output 1300 ml  Net -150 ml     Physical Exam: Vital Signs Blood pressure (!) 146/76, pulse 88, temperature 98.4 F (36.9 C), temperature source Oral, resp. rate 19, height 5\' 11"  (1.803 m), weight 66.3 kg, SpO2 94 %.  General: No acute distress Mood and affect are appropriate Heart: Regular rate and rhythm no rubs murmurs or extra sounds Lungs: Clear to auscultation, breathing unlabored, no rales or wheezes Abdomen: Positive bowel sounds, soft nontender to palpation, nondistended Extremities: No clubbing, cyanosis, or edema Skin: No evidence of breakdown, no evidence of rash No active movement left upper extremity and bilateral lower extremities Right upper  extremity has 2 - elbow flexion extension as well as finger flexion. Sensation unable to assess secondary to severe communication deficits.  He does not wince to a pinch in the extremities. Tone is increased MAS 3-4 at the left elbow flexors.  MAS 2-3 in bilateral knee flexors Severe dysarthria  Assessment/Plan: 1. 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  Physiatrist is providing close team supervision and 24 hour management of active medical problems listed below.  Physiatrist and rehab team continue to assess barriers to discharge/monitor patient progress toward functional and medical goals  Care Tool:  Bathing        Body parts bathed by helper: Right arm, Left arm, Chest, Abdomen, Front perineal area, Buttocks, Right upper leg, Left upper leg, Right lower leg, Left lower leg, Face     Bathing assist Assist Level: Dependent - Patient 0%     Upper Body Dressing/Undressing Upper body dressing   What is the patient wearing?: Hospital gown only    Upper body assist Assist Level: Dependent - Patient 0%    Lower Body Dressing/Undressing Lower body dressing      What is the patient wearing?: Incontinence brief     Lower body assist Assist for lower body dressing: Dependent - Patient 0%     Toileting Toileting    Toileting assist Assist for toileting: 2 Helpers     Transfers Chair/bed transfer  Transfers assist  Chair/bed transfer activity did  not occur: Safety/medical concerns  Chair/bed transfer assist level: 2 Helpers     Locomotion Ambulation   Ambulation assist   Ambulation activity did not occur: Safety/medical concerns          Walk 10 feet activity   Assist  Walk 10 feet activity did not occur: Safety/medical concerns        Walk 50 feet activity   Assist Walk 50 feet with 2 turns activity did  not occur: Safety/medical concerns         Walk 150 feet activity   Assist Walk 150 feet activity did not occur: Safety/medical concerns         Walk 10 feet on uneven surface  activity   Assist Walk 10 feet on uneven surfaces activity did not occur: Safety/medical concerns         Wheelchair     Assist     Wheelchair activity did not occur: Safety/medical concerns         Wheelchair 50 feet with 2 turns activity    Assist    Wheelchair 50 feet with 2 turns activity did not occur: Safety/medical concerns       Wheelchair 150 feet activity     Assist  Wheelchair 150 feet activity did not occur: Safety/medical concerns       Blood pressure (!) 146/76, pulse 88, temperature 98.4 F (36.9 C), temperature source Oral, resp. rate 19, height 5\' 11"  (1.803 m), weight 66.3 kg, SpO2 94 %.  Medical Problem List and Plan: 1.Dysarthria/quadriparesissecondary to multifocal infarcts mostly posterior circulation but also bilateral anterior circulation cardioembolic pattern as well as innominate artery occlusion, left ICA thrombus versus soft plaque, right V4 and basal artery occlusive thrombus  CIR PT OT speech, team conference in a.m. 2. Antithrombotics: -DVT/anticoagulation:Lovenox -antiplatelet therapy: Aspirin 325 mg and Plavix 75 mg daily 3. Pain Management:Tramadol as needed 4. Mood:Wellbutrin 150 mg twice daily, may need trial of Nuedexta, expect PBA given extensive posterior > anterior circulation infarcts -antipsychotic agents: Seroquel 25 mg 3 times daily as needed 5. Neuropsych: This patientis notcapable of making decisions on hisown behalf. 6. Skin/Wound Care:Routine skin checks 7. Fluids/Electrolytes/Nutrition:Routine in and outs  BMP within acceptable range on 4/26 8. Post stroke dysphagia. Dysphagia #1 honey thick liquids, advance diet as tolerated.  Monitor hydration- expect need for PEG poor prognosis  for recovery in short term, will discuss with speech therapy in a.m. 9. Orthostatic hypotension. Monitor with increased mobility  Labile on 4/26, continue to monitor  TEDs/Abd binder as needed 10. Hyperlipidemia. Lipitor 11. Tobacco abuse. NicoDerm patch.  Counseled when possible 12. Leukocytosis.   WBCs 14.7 on 4/26, trending down  Afebrile   Continue to monitor  LOS: 3 days A FACE TO FACE EVALUATION WAS PERFORMED  5/26 06/15/2019, 9:02 AM

## 2019-06-16 ENCOUNTER — Inpatient Hospital Stay (HOSPITAL_COMMUNITY): Payer: Medicaid Other

## 2019-06-16 ENCOUNTER — Inpatient Hospital Stay (HOSPITAL_COMMUNITY): Payer: Self-pay | Admitting: Speech Pathology

## 2019-06-16 ENCOUNTER — Inpatient Hospital Stay (HOSPITAL_COMMUNITY): Payer: Self-pay | Admitting: Occupational Therapy

## 2019-06-16 ENCOUNTER — Inpatient Hospital Stay (HOSPITAL_COMMUNITY): Payer: Self-pay

## 2019-06-16 IMAGING — CT CT ABDOMEN W/O CM
2 of 4 series · 17 of 46 positions shown, 19 images · non-contrast
Comparison: None.

CLINICAL DATA: Severe dysplasia.

EXAM:
CT ABDOMEN WITHOUT CONTRAST
TECHNIQUE: Multidetector CT imaging of the abdomen was performed following the
standard protocol without IV contrast.

[Series 2: abd/ pelvis 5.0 i30f 2 · axial · 0.73mm/px · z∈[-416,-180]mm · 14 of 53 slices shown, 16 images]
[im 3/53  soft-tissue]
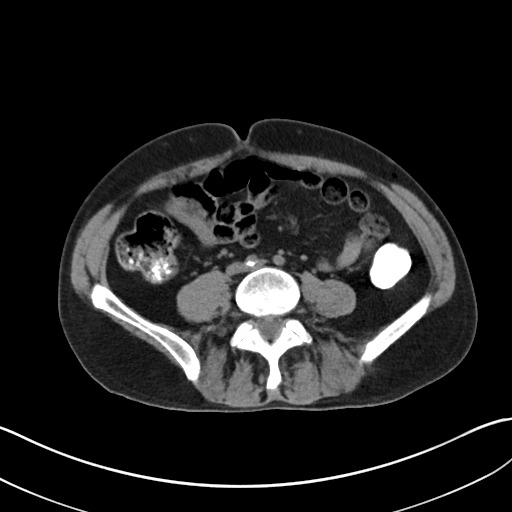
[im 3/53  bone]
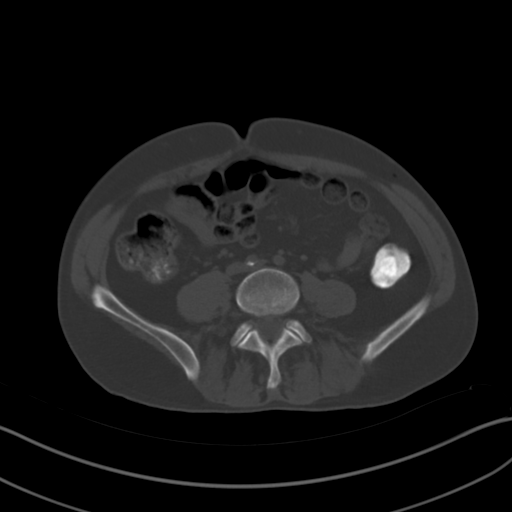
[im 7/53  soft-tissue]
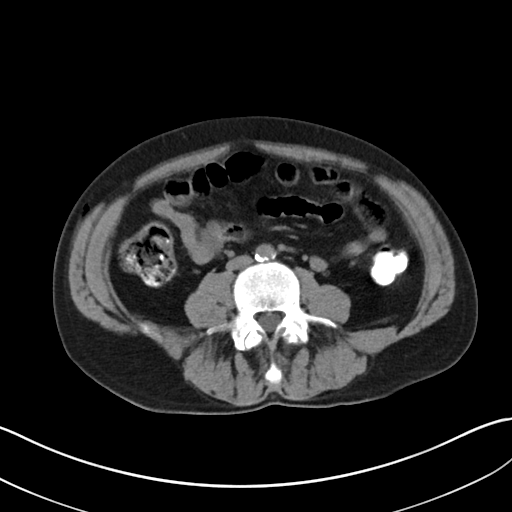
[im 11/53  soft-tissue]
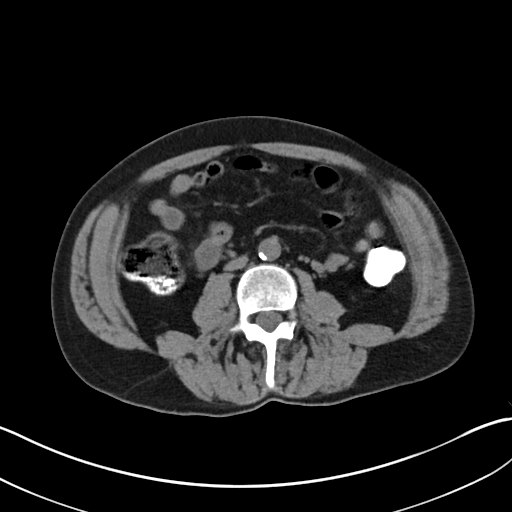
[im 14/53  soft-tissue]
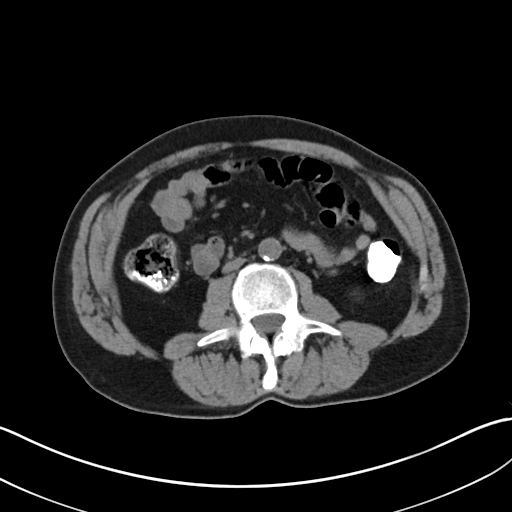
[im 18/53  soft-tissue]
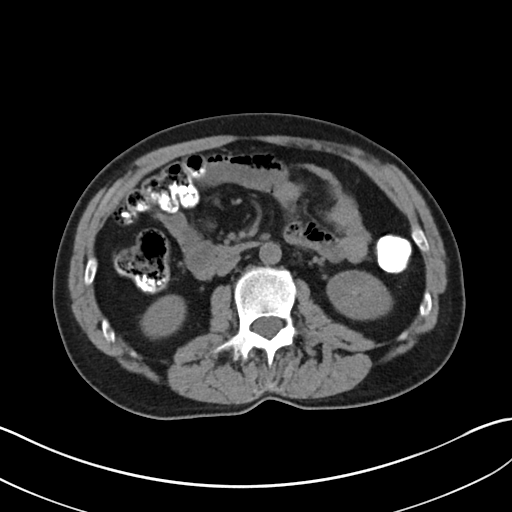
[im 22/53  soft-tissue]
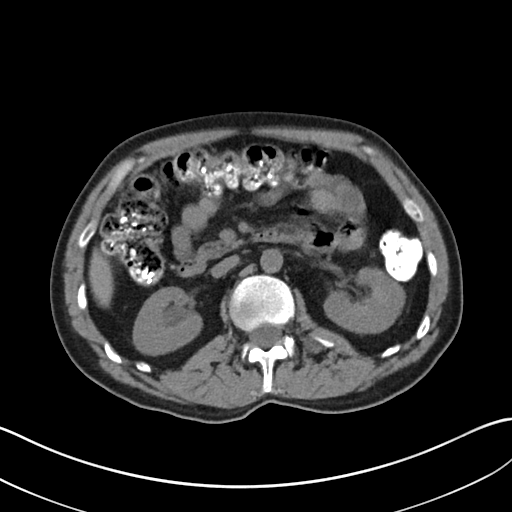
[im 24/53  soft-tissue]
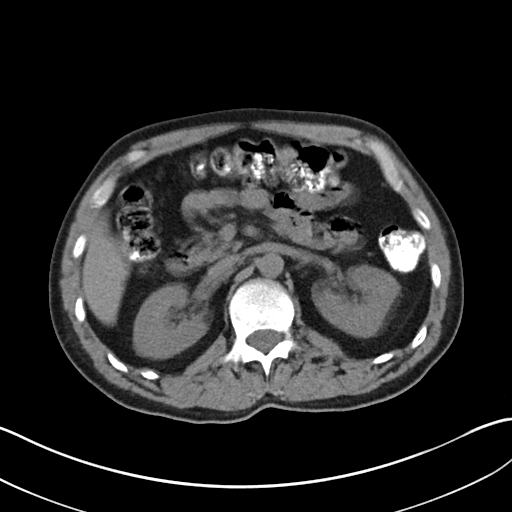
[im 29/53  soft-tissue]
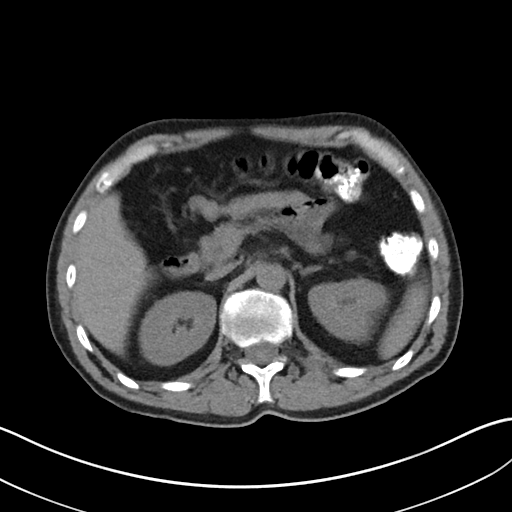
[im 31/53  soft-tissue]
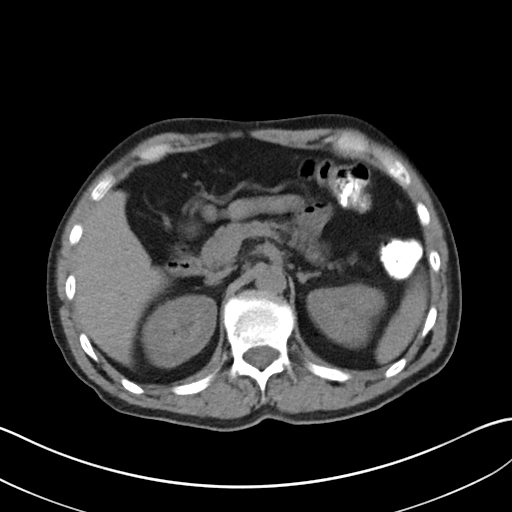
[im 31/53  bone]
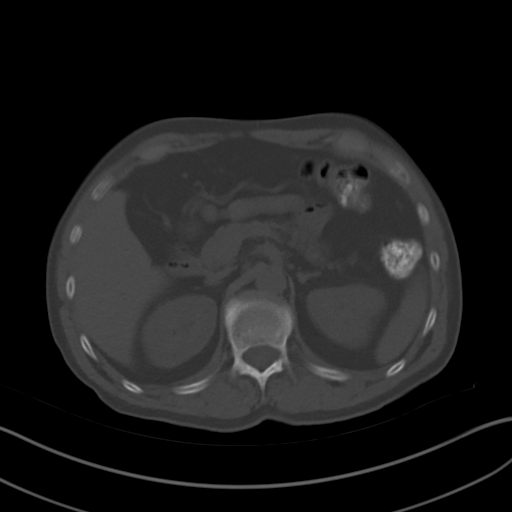
[im 35/53  soft-tissue]
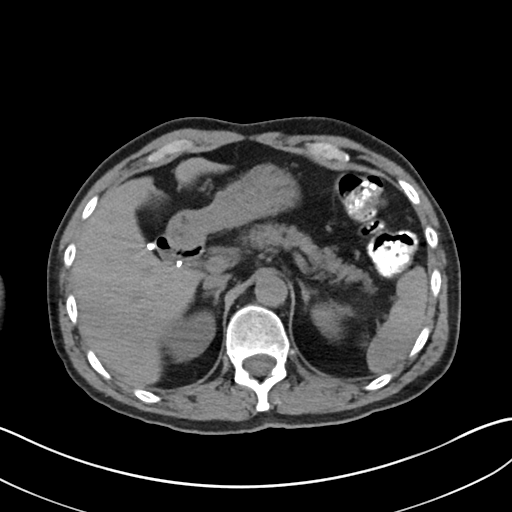
[im 40/53  soft-tissue]
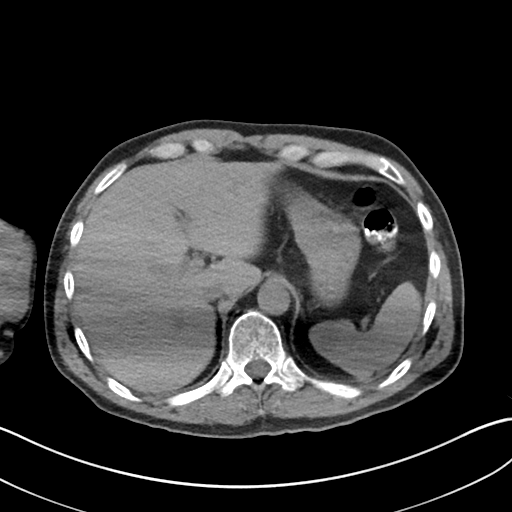
[im 42/53  soft-tissue]
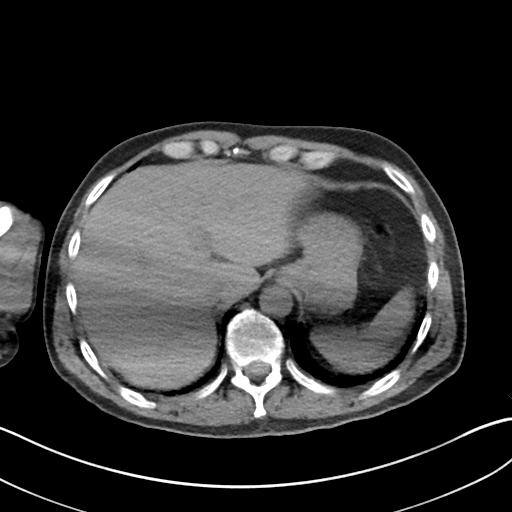
[im 46/53  soft-tissue]
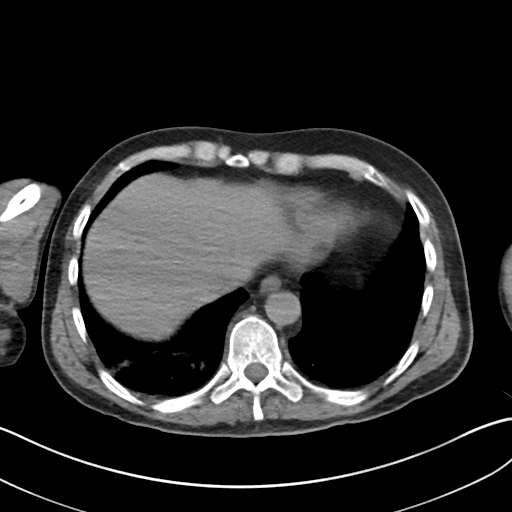
[im 50/53  soft-tissue]
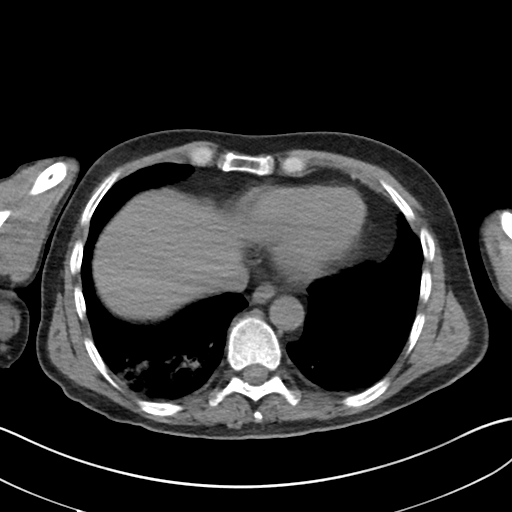

[Series 5: cor st · coronal · 0.62mm/px · 3 of 101 slices shown]
[im 34/101  soft-tissue]
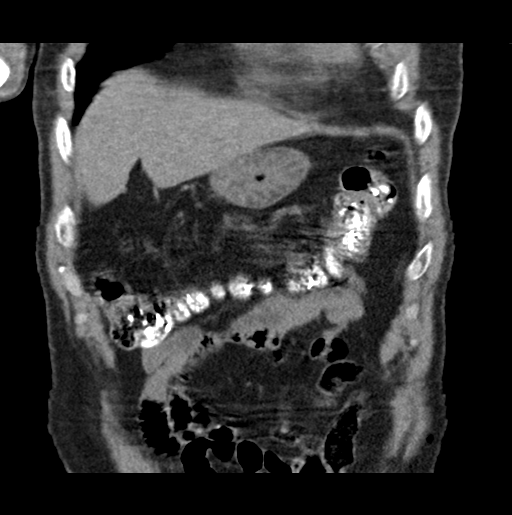
[im 45/101  soft-tissue]
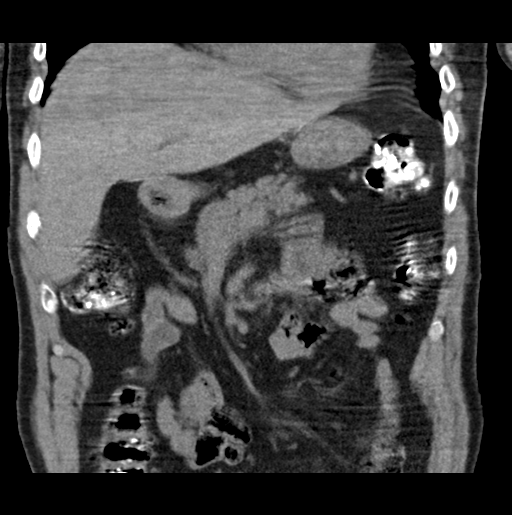
[im 56/101  soft-tissue]
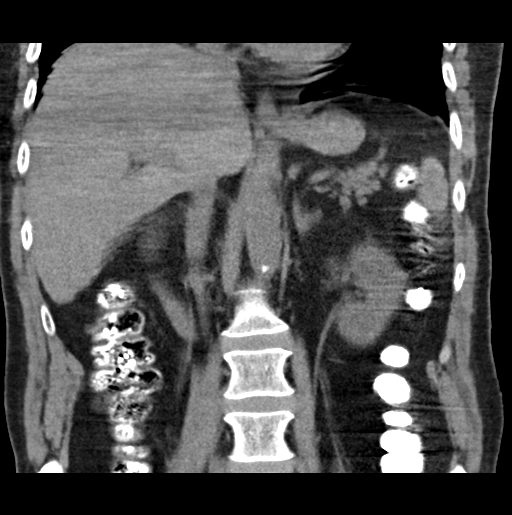

[17 of 46 positions shown; findings below may reference images not displayed]

FINDINGS: Lower chest: Mild to moderate severity atelectasis and/or infiltrate
is seen within the posterior aspect of the right lung base.

Hepatobiliary: No focal liver abnormality is seen. Status post
cholecystectomy. No biliary dilatation.

Pancreas: Unremarkable. No pancreatic ductal dilatation or
surrounding inflammatory changes.

Spleen: Normal in size without focal abnormality.

Adrenals/Urinary Tract: Adrenal glands are unremarkable. Kidneys are
normal in size, without renal calculi or hydronephrosis. A 1.7 cm
cyst is seen within the mid to lower right kidney.

Stomach/Bowel: Stomach is within normal limits. The appendix is not
identified. No evidence of bowel wall thickening, distention, or
inflammatory changes.

Vascular/Lymphatic: There is moderate severity calcification of the
abdominal aorta. No enlarged abdominal lymph nodes.

Other: No abdominal wall hernia or abnormality.

Musculoskeletal: A few small foci of air density are seen within the
subcutaneous fat along the anterolateral aspect of the lower
abdominal wall, bilaterally.

No acute or significant osseous findings.
IMPRESSION: 1. Mild to moderate severity atelectasis and/or infiltrate within
the posterior aspect of the right lung base.
2. Evidence of prior cholecystectomy.
3. Small right renal cyst.

Aortic Atherosclerosis ([2A]-[2A]).

## 2019-06-16 NOTE — Progress Notes (Signed)
Physical Therapy Session Note  Patient Details  Name: Adrian [name] MRN: [id number] Date of Birth: April 04, 1965  Today's Date: 06/16/2019 PT Individual Time: 3532-9924 PT Individual Time Calculation (min): 69 min   Short Term Goals: Week 1:  PT Short Term Goal 1 (Week 1): Pt will perform bed<>chair transfer with Max assist +2 PT Short Term Goal 2 (Week 1): Pt will maintain static sitting balance EOB with UE support and Mod assist +1 PT Short Term Goal 3 (Week 1): Pt will tolerate x 5 minutes in the standing frame for LE weightbearing and standing tolerance, dependent assist.  Skilled Therapeutic Interventions/Progress Updates: Pt presents sitting in TIS w/c, appears to have pain.  Spouse present and states LBP.  Pt required 2 person assist for slide board transfer w/c > bed.  Assist of 2 for sit to left side-lying and log roll to supine.  Pt required rest d/t c/o pain w/ grimacing and tears.  Pt performed multiple log-rolls side to side w/ max A after positioning in hook-lying position .  Pt will hold PT hand to assist w/ maintaining side-lying w/ max A.  Pt performed left side-lying to sit w/ 2 person assist and required max A to maintain w/ positioning of LEs on floor.  Pt tolerated long stretches to HC, HS and adductors.  Pt tolerated PROM to LEs for all planes.  Trace movement to left adductors/IR w/ verbal cues.  Pt able to bring right UE up to stomach/chest w/ cueing.  Pt states some relief w/ long stretch of bilateral HS.  Pt left semi-reclined in bed w/ spouse present and all needs in place.  Bilateral LEs placed in soft PRAFOs to maintain neutral hip rotation.     Therapy Documentation Precautions:  Precautions Precautions: Fall, Other (comment) Precaution Comments: check BP on left arm due to occlusion on R side. Restrictions Weight Bearing Restrictions: No General:   Vital Signs: Therapy Vitals Pulse Rate: 100 BP: (!) 142/92 Patient Position (if appropriate): Sitting Pain:pt c/o  LBP, but unable to quantify.  Grimacing w/ movements. Pain Assessment Pain Scale: Faces Faces Pain Scale: Hurts even more Pain Type: Chronic pain Pain Location: Back Pain Orientation: Lower Pain Descriptors / Indicators: Discomfort Pain Onset: On-going Pain Intervention(s): Distraction;Repositioned Multiple Pain Sites: No Mobility:      Therapy/Group: Individual Therapy  [name] 06/16/2019, 11:58 AM

## 2019-06-16 NOTE — Patient Care Conference (Signed)
Inpatient RehabilitationTeam Conference and Plan of Care Update Date: 06/16/2019   Time: 10:40 AM   Patient Name: Adrian [name]      Medical Record Number: [id number]  Date of Birth: 11-23-1965 Sex: Male         Room/Bed: 0X38H/8E99B-71 Payor Info: Payor: /    Admit Date/Time:  06/12/2019  4:39 PM  Primary Diagnosis:  Basilar artery occlusion  Patient Active Problem List   Diagnosis Date Noted  . 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 Depressive reaction   . Labile blood pressure   . Dysphagia, post-stroke   . Cardioembolic stroke (HCC) 06/12/2019  . Chronic right-sided low back pain with right-sided sciatica   . Tobacco abuse   . Leukocytosis   . Prediabetes   . CVA (cerebral vascular accident) (HCC) 06/02/2019  . Basilar artery occlusion 06/02/2019    Expected Discharge Date: Expected Discharge Date: 07/09/19  Team Members Present: Physician leading conference: Dr. Claudette Laws Care Coodinator Present: Cheyenne Adas, RN, BSN, CRRN;Christina Vita Barley, BSW;Genie Chauntay Paszkiewicz, RN, MSN Nurse Present: Other (comment)(Marie Poynter, RN) PT Present: Malachi Pro, PT OT Present: Perrin Maltese, OT SLP Present: Suzzette Righter, CF-SLP PPS Coordinator present : Edson Snowball, Park Breed, SLP     Current Status/Progress Goal Weekly Team Focus  Bowel/Bladder   Bowel: Pt is continent and is able to use a bedpan. Last bm 4.26.21 Bladder: Condom catheter in place, changed 4.27.21. Marland Kitchen Urine is amber and clear.  Maintain bowel and bladder continence.  Offer toileting q2-3h throughout each shift.   Swallow/Nutrition/ Hydration   Dys 1/Honey, Total A for feeding, severe oral phase impairments  Min  tolerance current diet, trials thin with chin tuck (per last MBSS), increasing PO intake   ADL's   total assist +2 for all bed mobility, bathing, dressing tasks supine to sit.  total +2 for sliding board transfers.  total assist for static sitting balance as  well.  Has Brunnstrum stage III-IV in the RUE and hand.  Stage II in the LUE with increased flexor tone in the elbow.  currently min to mod assist  selfcare retraining, balance retraining, transfer training, balance retraining, pt/family education, AAROM/PROM, neuromuscular re-education   Mobility   MaxA for rolling. Dep +2 assist for bed mobility. Static sitting minA/modA. Transfers dep +2 with slideboard.  minA sitting balance, minA/modA sit to stand and transfers, maxA ambulation, Supervision WC  tone management, upright activity tolerance, standing frame/tilt table, sitting balance and transfers   Communication   Max-Total A word level due to severe dysarthria and decreased breath support, head nods most effective at this time, limited ability to ues comm board due to physical limitations of upper extremities  Mod  intelligibility at the word level, multimodal communication for basic wants and needs, EMST/IMST   Safety/Cognition/ Behavioral Observations  Max A  Mod A  focused and sustained attention, functional basic problem solving, initiation   Pain   Unable to communicate pain rating. Face Pai Scale is being used to observe for s/s of pain. Tramadol 50mg  prn is effective for pain control.  Remain free of pain.  Monitor for s/s of pain and treat as needed.   Skin   Current Braden score= 11. Braden interventions are in place. Specialty bed with P+V and Rotation. Unstagable PI coccyx 2cm (l) x 0.5cm (w). DTPI left heel 2cm (l0 x 1.5cm (w). Mepilex dressings changed 4.27.21.  Maintain skin integrity, prevent further breakdown, and follow wound care orders.  Monitor  skin integrity each shift.    Rehab Goals Patient on target to meet rehab goals: Yes Rehab Goals Revised: Goals downgraded to MAX A and wife and son are aware of discharge goals *See Care Plan and progress notes for long and short-term goals.     Barriers to Discharge  Current Status/Progress Possible Resolutions Date Resolved    Nursing  Wound Care;Nutrition means;Incontinence               PT  Decreased caregiver support;Inaccessible home environment;Behavior                 OT                  SLP                SW Home environment access/layout;Decreased caregiver support Wife is disabled, son works, daughter is moving home to help with patient care at discharge, multistep entry to home Son is currently working to install a ramp to entrance of the home          Discharge Planning/Teaching Needs:  Home with wife, daughter and son  ADLs, diet, medications, transfers, etc.   Team Discussion: MD dysarthria, dysphagia, may need a PEG, IV fluids for hydration, BP controlled, tone increased, may need botox later.  RN condom cath, [facility] 4/26.  OT tot A UB self care, max wash face, SB transfers tot +2, dressing tot +2, goals min/mod, will need to downgrade goals.  PT total A about everything, used tilt table, need to downgrade goals.  SLP dysarthria,working on single word functions, severe cog impairments, severe dysphagia.  Has a wife and a son.   Revisions to Treatment Plan: N/A     Medical Summary Current Status: Severe dysarthria and severe dysphagia, poor p.o. intake, spasticity mainly affecting left upper extremity and bilateral lower extremities Weekly Focus/Goal: Spasticity management, swallowing reassessment  Barriers to Discharge: Incontinence;Medical stability;Neurogenic Bowel & Bladder;Nutrition means   Possible Resolutions to Barriers: Will likely need PEG placement but will first need to reassess swallow prognosis.   Continued Need for Acute Rehabilitation Level of Care: The patient requires daily medical management by a physician with specialized training in physical medicine and rehabilitation for the following reasons: Direction of a multidisciplinary physical rehabilitation program to maximize functional independence : Yes Medical management of patient stability for increased activity during  participation in an intensive rehabilitation regime.: Yes Analysis of laboratory values and/or radiology reports with any subsequent need for medication adjustment and/or medical intervention. : Yes   I attest that I was present, lead the team conference, and concur with the assessment and plan of the team.   Jodell Cipro M 06/16/2019, 2:47 PM   Team conference was held via web/ teleconference due to [place] - 19

## 2019-06-16 NOTE — Progress Notes (Signed)
Physical Therapy Session Note  Patient Details  Name: Adrian [name] MRN: [id number] Date of Birth: [birth year]  Today's Date: 06/16/2019 PT Individual Time: 1258-1400 PT Individual Time Calculation (min): 62 min   Short Term Goals: Week 1:  PT Short Term Goal 1 (Week 1): Pt will perform bed<>chair transfer with Max assist +2 PT Short Term Goal 2 (Week 1): Pt will maintain static sitting balance EOB with UE support and Mod assist +1 PT Short Term Goal 3 (Week 1): Pt will tolerate x 5 minutes in the standing frame for LE weightbearing and standing tolerance, dependent assist.  Skilled Therapeutic Interventions/Progress Updates:     Pt received supine in bed. Initially shakes head "no" to therapy but is agreeable with minimal encouragement. No report of pain. Supine>sit with totalA +1 and pt maintains static sitting at EOB with min/modA +1 in preparation for slideboard transfer. Slideboard transfer to TIS with total +2 assist and transport to therapy gym. [place] transfer from TIS to mat with same assist and cues to pt for anterior trunk lean and sequencing of transfer.  Pt works on static sitting balance and upright activity tolerance, with mirror placed for visual feedback. Pt able to maintain static sitting balance without UE support for ~5 seconds at a time, losing balance in all directions depending on COG. With arms propped on tray table and pillow pt able to maintain static sitting balance for several minutes at time. BUEs positioned to stretch shoulder in flexion and provide passive extension stretch for elbows. PT cues for AROM of neck in all directions and pt able to follow directions.   Pt also performs passive stretching of shoulder internal rotators and thoracic spine, leaning back on blue therapy ball with BUEs placed in slight extension.  Pop[place] transfer to TIS with total +2 assist and slideboard transfer back to bed with same assist. Sit>supine with total A +1. Pt left supine in bed  with alarm intact and all needs within reach.  Therapy Documentation Precautions:  Precautions Precautions: Fall, Other (comment) Precaution Comments: check BP on left arm due to occlusion on R side. Restrictions Weight Bearing Restrictions: No    Therapy/Group: Individual Therapy  Beau Fanny, PT, DPT 06/16/2019, 4:24 PM

## 2019-06-16 NOTE — Progress Notes (Signed)
Pt transported to CT ?

## 2019-06-16 NOTE — Progress Notes (Addendum)
[place] PHYSICAL MEDICINE & REHABILITATION PROGRESS NOTE   Subjective/Complaints: Patient remains severely dysarthric. Nodding Y/N today  Per OT tone improved LUE   ROS- unable to eval due to aphasia/dysarthria  Objective:   No results found. Recent Labs    06/14/19 0652  WBC 14.7*  HGB 13.6  HCT 42.6  PLT 302   Recent Labs    06/14/19 0652  NA 142  K 4.0  CL 110  CO2 23  GLUCOSE 115*  BUN 21*  CREATININE 0.78  CALCIUM 9.0    Intake/Output Summary (Last 24 hours) at 06/16/2019 0842 Last data filed at 06/15/2019 2200 Gross per 24 hour  Intake 848.06 ml  Output 500 ml  Net 348.06 ml     Physical Exam: Vital Signs Blood pressure 133/80, pulse 93, temperature 98 F (36.7 C), resp. rate 18, height '5\' 11"'$  (1.803 m), weight 66.3 kg, SpO2 97 %.   General: No acute distress Mood and affect are appropriate Heart: Regular rate and rhythm no rubs murmurs or extra sounds Lungs: Clear to auscultation, breathing unlabored, no rales or wheezes Abdomen: Positive bowel sounds, soft nontender to palpation, nondistended Extremities: No clubbing, cyanosis, or edema Skin: No evidence of breakdown, no evidence of rash Neurologic: Cranial nerves II through XII intact, motor strength is 0/5 in Left deltoid, bicep, tricep, grip,and bilateral  hip flexor, knee extensors, ankle dorsiflexor and plantar flexor 2- Right biceps and triceps  Sensory exam normal sensation to light touch and proprioception in bilateral upper and lower extremities Cerebellar exam normal finger to nose to finger as well as heel to shin in bilateral upper and lower extremities Musculoskeletal: Full range of motion in all 4 extremities. No joint swelling Tone is increased MAS 1-2  at the left elbow flexors.  MAS 2-3 in bilateral knee flexors Severe dysarthria  Assessment/Plan: 1. 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  Physiatrist is providing close team supervision and 24 hour management of active medical problems listed below.  Physiatrist and rehab team continue to assess barriers to discharge/monitor patient progress toward functional and medical goals  Care Tool:  Bathing        Body parts bathed by helper: Right arm, Left arm, Chest, Abdomen, Front perineal area, Buttocks, Right upper leg, Left upper leg, Right lower leg, Left lower leg, Face     Bathing assist Assist Level: Dependent - Patient 0%     Upper Body Dressing/Undressing Upper body dressing   What is the patient wearing?: Hospital gown only    Upper body assist Assist Level: Dependent - Patient 0%    Lower Body Dressing/Undressing Lower body dressing      What is the patient wearing?: Pants, Incontinence brief     Lower body assist Assist for lower body dressing: Dependent - Patient 0%     Toileting Toileting    Toileting assist Assist for toileting: 2 Helpers     Transfers Chair/bed transfer  Transfers assist  Chair/bed transfer activity did not occur: Safety/medical concerns  Chair/bed transfer assist level: 2 Helpers(sliding board)     Locomotion Ambulation   Ambulation assist   Ambulation activity did not occur: Safety/medical concerns          Walk 10 feet activity   Assist  Walk 10 feet activity did not occur: Safety/medical concerns        Walk 50 feet activity  Assist Walk 50 feet with 2 turns activity did not occur: Safety/medical concerns         Walk 150 feet activity   Assist Walk 150 feet activity did not occur: Safety/medical concerns         Walk 10 feet on uneven surface  activity   Assist Walk 10 feet on uneven surfaces activity did not occur: Safety/medical concerns         Wheelchair     Assist     Wheelchair activity did not occur: Safety/medical concerns          Wheelchair 50 feet with 2 turns activity    Assist    Wheelchair 50 feet with 2 turns activity did not occur: Safety/medical concerns       Wheelchair 150 feet activity     Assist  Wheelchair 150 feet activity did not occur: Safety/medical concerns       Blood pressure 133/80, pulse 93, temperature 98 F (36.7 C), resp. rate 18, height '5\' 11"'$  (1.803 m), weight 66.3 kg, SpO2 97 %.  Medical Problem List and Plan: 1.Dysarthria/quadriparesissecondary to multifocal infarcts mostly posterior circulation but also bilateral anterior circulation cardioembolic pattern as well as innominate artery occlusion, left ICA thrombus versus soft plaque, right V4 and basal artery occlusive thrombus  CIR PT OT speech,  Team conference today please see physician documentation under team conference tab, met with team  to discuss problems,progress, and goals. Formulized individual treatment plan based on medical history, underlying problem and comorbidities. 2. Antithrombotics: -DVT/anticoagulation:Lovenox -antiplatelet therapy: Aspirin 325 mg and Plavix 75 mg daily 3. Pain Management:Tramadol as needed Lumbar pain (location difficult to assess due to communication deficits) Xray unremarkable , technically suboptimal d/t contrast in colon this applies only to lateral images, AP looks OK  4. Mood:Wellbutrin 150 mg twice daily, may need trial of Nuedexta, expect PBA given extensive posterior > anterior circulation infarcts -antipsychotic agents: Seroquel 25 mg 3 times daily as needed 5. Neuropsych: This patientis notcapable of making decisions on hisown behalf.Wife can sign consent for procedure- ie PEG 6. Skin/Wound Care:Routine skin checks 7. Fluids/Electrolytes/Nutrition:Routine in and outs  BMP within acceptable range on 4/26 8. Post stroke dysphagia. Dysphagia #1 honey thick liquids, advance diet as tolerated.  Monitor hydration- expect need for PEG  poor prognosis for recovery in short term,  discussed with speech therapy in team conf , also discussed with pt wife, have consulted IR for pladcement  Intake 0-25% meals , minimal po fluid intake  9. Orthostatic hypotension. Monitor with increased mobility  Labile on 4/26, continue to monitor  TEDs/Abd binder as needed 10. Hyperlipidemia. Lipitor 11. Tobacco abuse. NicoDerm patch.  Counseled when possible 12. Leukocytosis.   WBCs 14.7 on 4/26, trending down  Afebrile   Continue to monitor  LOS: 4 days A FACE TO FACE EVALUATION WAS PERFORMED  Charlett Blake 06/16/2019, 8:42 AM

## 2019-06-16 NOTE — Progress Notes (Signed)
Speech Language Pathology Daily Session Note  Patient Details  Name: Adrian [name] MRN: [id number] Date of Birth: Jun 20, 1965  Today's Date: 06/16/2019 SLP Individual Time: 0932-1000 SLP Individual Time Calculation (min): 28 min  Short Term Goals: Week 1: SLP Short Term Goal 1 (Week 1): Pt will demonstrated efficient mastication and oral clearance of puree solids with Max A multimodal cueing for use of compensaotry swallow strategies. SLP Short Term Goal 2 (Week 1): Pt will consume therapeutic trials of ice and/or thin H2O with adequate oral containment and minimal overt s/sx aspiration with Max A multimodal cues for accurate use of chin tuck maneuver. SLP Short Term Goal 3 (Week 1): Pt communicate basic wants and needs via multimodal means with Max A cues. SLP Short Term Goal 4 (Week 1): Pt will verbalize at the word level to achieve 25% intelligibility with Max A multimodal cueing for intelligibility strategies. SLP Short Term Goal 5 (Week 1): Pt will sustain attention to functional tasks for 3-5 minutes with Max A multimodal cues. SLP Short Term Goal 6 (Week 1): Pt will demonstrate ability to initiate and problem solve basic familiar tasks with Max A multimodal cueing.  Skilled Therapeutic Interventions: Pt was seen for skilled ST targeting dysphagia and speech goals. Pt's wife present for session. Dysphagia interventions focused on providing Max A multimodal cues for volitional cough and use of suction for secretion management. Pt has sounded wet/congested at baseline since admission to CIR and with limited ability to expectorate any secretions orally. Max A multimodal cues for use of intelligibility strategies with focus on increased vocal intensity, overstimulation, and pacing provided during functional word level expressions (I.e. bathroom, thirsty, etc.). Pt very limited by pain today and perseverative on laying down and going home, requiring Max A multimodal cues for sustained attention and  participation in functional tasks. Pt left sitting in tilt in space chair with alarm set and needs within reach, wife still present. Continue per current plan of care.        Pain Pain Assessment Pain Scale: Faces Faces Pain Scale: Hurts even more Pain Type: Chronic pain Pain Location: Back Pain Orientation: Lower Pain Descriptors / Indicators: Discomfort Pain Onset: On-going Pain Intervention(s): Distraction;Repositioned Multiple Pain Sites: No  Therapy/Group: Individual Therapy  [name] 06/16/2019, 10:00 AM

## 2019-06-16 NOTE — Progress Notes (Signed)
Occupational Therapy Session Note  Patient Details  Name: Adrian [name] MRN: [id number] Date of Birth: 06-Oct-1965  Today's Date: 06/16/2019 OT Individual Time: 0800-0903 OT Individual Time Calculation (min): 63 min    Short Term Goals: Week 1:  OT Short Term Goal 1 (Week 1): patient will increase ability to roll right/left with max A of one OT Short Term Goal 2 (Week 1): patient will improve grasp (right or left) to allow him to hold / manipulate built up handled utencils OT Short Term Goal 3 (Week 1): patient will improve trunk mobility to increase sitting edge of bed balance with mod A of one  Skilled Therapeutic Interventions/Progress Updates:    Pt in bed to start the session.  Began with AAROM exercises for the left shoulder, elbow, and hand for 1 set of 5-10 reps each.  Progressed to more PROM in the LUE with noted increased tone in the left elbow and digit flexors.  Tone fluctuated when he was transitioned to supine and decreased at the elbow and digits.  Worked on on washing his face with HOB elevated and maxl assist using the RUE. Then had him working on rolling in supine for pulling pants over hips.  Total assist for donning clothing over his LEs as well as for pulling them up over the hips.  Total assist +2 (pt 10%) with supine to sit.  He needed total assist for static sitting balance with +2 assist with donning a pullover shirt.  Total +2 (pt 10%) for transfer from the EOB to the tilt in space wheelchair.  Soft touch call button in reach and safety belt in place.    Therapy Documentation Precautions:  Precautions Precautions: Fall, Other (comment) Precaution Comments: check BP on left arm due to occlusion on R side. Restrictions Weight Bearing Restrictions: No  Pain: Pain Assessment Pain Scale: Faces Faces Pain Scale: Hurts even more Pain Type: Chronic pain Pain Location: Back Pain Orientation: Lower Pain Descriptors / Indicators: Discomfort Pain Onset: On-going Pain  Intervention(s): Distraction;Repositioned Multiple Pain Sites: No ADL: See Care Tool Section for some details of mobility and selfcare  Therapy/Group: Individual Therapy  Asyah Candler OTR/L 06/16/2019, 10:33 AM

## 2019-06-17 ENCOUNTER — Inpatient Hospital Stay (HOSPITAL_COMMUNITY): Payer: Self-pay

## 2019-06-17 ENCOUNTER — Inpatient Hospital Stay (HOSPITAL_COMMUNITY): Payer: Self-pay | Admitting: Speech Pathology

## 2019-06-17 ENCOUNTER — Inpatient Hospital Stay (HOSPITAL_COMMUNITY): Payer: Self-pay | Admitting: Occupational Therapy

## 2019-06-17 MED ORDER — GABAPENTIN 300 MG PO CAPS
600.0000 mg | ORAL_CAPSULE | Freq: Three times a day (TID) | ORAL | Status: DC
  Administered 2019-06-17 – 2019-07-09 (×66): 600 mg via ORAL
  Filled 2019-06-17 (×67): qty 2

## 2019-06-17 MED ORDER — COLLAGENASE 250 UNIT/GM EX OINT
TOPICAL_OINTMENT | Freq: Every day | CUTANEOUS | Status: DC
  Administered 2019-06-23: 1 via TOPICAL
  Filled 2019-06-17: qty 30

## 2019-06-17 NOTE — Progress Notes (Signed)
Occupational Therapy Session Note  Patient Details  Name: Adrian [name] MRN: [id number] Date of Birth: Jun 02, 1965  Today's Date: 06/17/2019 OT Individual Time: 0258-5277 OT Individual Time Calculation (min): 48 min    Short Term Goals: Week 1:  OT Short Term Goal 1 (Week 1): patient will increase ability to roll right/left with max A of one OT Short Term Goal 2 (Week 1): patient will improve grasp (right or left) to allow him to hold / manipulate built up handled utencils OT Short Term Goal 3 (Week 1): patient will improve trunk mobility to increase sitting edge of bed balance with mod A of one  Skilled Therapeutic Interventions/Progress Updates:    Session 1: (8242-3536)  Pt in bed to start session with family present.  Worked on rolling in bed supine with HOB flat for donning shorts over feet and hips.  Pt with trace movement of the RLE to assist therapist with donning pants over feet.  Total assist was then needed for pulling up over hips with total assist for rolling side to side in the bed.  Worked on activating abdominal muscles with slight cervical flexion in supine as pre-requisite for rolling, with total assist.  Had pt transition to sitting from supine with total assist +2 (pt 10%).  Worked on static sitting balance EOB with total assist.  He was able to maintain sitting balance at times with min guard for periods of 5-15 seconds once balanced.  Finished session with transfer to the wheelchair with total assist +2 (pt 10%) via sliding board.  Pt was left in the chair with call button and phone in reach.  Family present as well as have been instructed on pressure relief.    Session 2: (1515-1600)  Pt in bed to start session with spouse present.  He worked on transition to sitting EOB with total assist and then focused session on static sitting balance with BUEs supported on the bedside table.  He was able to maintain static sitting balance for intervals of 10-30 seconds once his UEs were  support on the table and initial balance was achieved.  Increased flexor tone noted was noted in the left elbow, with therapist assisting with maintaining position of the UE on the bedside table.  Worked on [employer] of the RUE on the table with max assist.  He was able to initiate some movements of adduction on the table with trace movement of external rotation while trying to wipe the table with a washcloth.  He needed max hand over hand assist to complete the same movements with the more impaired LUE.  Helped pt transition to supine from sitting with total assist +2 (pt 10%).  Pt left positioned with BUEs on pillows and LEs positioned on soft PRAFOs.  [name] in the room as well.    Therapy Documentation Precautions:  Precautions Precautions: Fall, Other (comment) Precaution Comments: check BP on left arm due to occlusion on R side. Restrictions Weight Bearing Restrictions: No   Pain: Pain Assessment Pain Scale: Faces Pain Score: 9  Faces Pain Scale: Hurts little more Pain Type: Chronic pain Pain Location: Back Pain Orientation: Lower Pain Descriptors / Indicators: Discomfort Pain Onset: With Activity Patients Stated Pain Goal: 0 Pain Intervention(s): Repositioned ADL: See Care Tool Section for some details of mobility and selfcare  Therapy/Group: Individual Therapy  Choice Kleinsasser OTR/L 06/17/2019, 12:06 PM

## 2019-06-17 NOTE — Progress Notes (Signed)
Pt showing signs of discomfort, giving non verbal indication of being in pain when asked. Pt was repositioned to the right side. Pt drank of water.

## 2019-06-17 NOTE — Progress Notes (Signed)
Speech Language Pathology Daily Session Note  Patient Details  Name: Adrian [name] MRN: [id number] Date of Birth: [birth year]  Today's Date: 06/17/2019 SLP Individual Time: 1431-1501 SLP Individual Time Calculation (min): 30 min  Short Term Goals: Week 1: SLP Short Term Goal 1 (Week 1): Pt will demonstrated efficient mastication and oral clearance of puree solids with Max A multimodal cueing for use of compensaotry swallow strategies. SLP Short Term Goal 2 (Week 1): Pt will consume therapeutic trials of ice and/or thin H2O with adequate oral containment and minimal overt s/sx aspiration with Max A multimodal cues for accurate use of chin tuck maneuver. SLP Short Term Goal 3 (Week 1): Pt communicate basic wants and needs via multimodal means with Max A cues. SLP Short Term Goal 4 (Week 1): Pt will verbalize at the word level to achieve 25% intelligibility with Max A multimodal cueing for intelligibility strategies. SLP Short Term Goal 5 (Week 1): Pt will sustain attention to functional tasks for 3-5 minutes with Max A multimodal cues. SLP Short Term Goal 6 (Week 1): Pt will demonstrate ability to initiate and problem solve basic familiar tasks with Max A multimodal cueing.  Skilled Therapeutic Interventions: Pt was seen for skilled ST targeting dysphagia and speech goals. Upon arrival, pt was consuming honey thick liquids via tsp from wife, and skilled observation provided in addition to continued education regarding rate of intake. No overt s/sx aspiration observed with honey thick juice, although AP transit of boluses remains prolonged and suspect lingual pumping evident. Recommend continue current diet.  Pt completed 5 sets of 5 EMST exercises (25 total) with device set to 5cm H2O resistance. Pt required Max A multimodal cues (including tactile assist for lip seal and demonstration of accurate exhalation techniques). Functional word level speech tasks targeted in order to increase word level  intelligibility of basic wants and needs. Pt was ~40% intelligible with Max A cues for use of increased vocal intensity and pacing (pt unable to execute cues for articulatory precision). Most intelligible words included: hungry, thirsty, bathroom, and pain. Pt still unable to successfully use soft call bell without Max A due to physical limitations of upper extremities. Pt let laying in bed with alarm set and wife present. Continue per current plan of care.        Pain Pain Assessment Pain Scale: Faces Pain Score: 6  Faces Pain Scale: Hurts little more Pain Type: Acute pain Pain Location: Back Pain Orientation: Lower Pain Descriptors / Indicators: Discomfort Pain Onset: On-going Pain Intervention(s): Medication (See eMAR) Multiple Pain Sites: No  Therapy/Group: Individual Therapy  [name] 06/17/2019, 12:35 PM

## 2019-06-17 NOTE — Progress Notes (Signed)
Pt c/o back pain, PRN tramadol given. CPT started by RT and completed. Pt oral care and hygiene care completed. Pt presents with productive cough, PRN robitussin given. Pt resting in bed at this time. Positioned on the L side.

## 2019-06-17 NOTE — Progress Notes (Signed)
Pt given pain medication and positioned on the Left side.Pt had boots and wrist splints placed. Pt having leg spasms frequently. Pillows placed under legs.

## 2019-06-17 NOTE — Progress Notes (Signed)
Team Conference Report to Patient/Family  Team Conference discussion was reviewed with the patient and caregiver, including goals, any changes in plan of care and target discharge date.  Patient and caregiver express understanding and are in agreement.  The patient has a target discharge date of 07/09/19. Reviewed dysphagia and recommendations for PEG placement and nutritional needs and current use of IVF. Discussed with patient and wife goals were downgraded to maximal assist for discharge and she continues to note that the family will pull together care for discharge home. Discussed installation of ramp for entry to home per son and therapy will make recommendations for DME. Reviewed "refusal of care" and rationale for Prevalon boots, medications in puree and therapy with note that [facility] will follow at discharge as well. Wife and patient acknowledged understanding of information reviewed.   Chana Bode B 06/17/2019, 12:31 PM

## 2019-06-17 NOTE — Progress Notes (Signed)
RT NOTES: CPT stopped in the middle of therapy d/t family member wanting patient to work with occupational therapy instead.

## 2019-06-17 NOTE — Progress Notes (Signed)
Speech Language Pathology Daily Session Note  Patient Details  Name: Adrian [name] MRN: [id number] Date of Birth: Nov 05, 1965  Today's Date: 06/17/2019 SLP Individual Time: 0830-0900 SLP Individual Time Calculation (min): 30 min  Short Term Goals: Week 1: SLP Short Term Goal 1 (Week 1): Pt will demonstrated efficient mastication and oral clearance of puree solids with Max A multimodal cueing for use of compensaotry swallow strategies. SLP Short Term Goal 2 (Week 1): Pt will consume therapeutic trials of ice and/or thin H2O with adequate oral containment and minimal overt s/sx aspiration with Max A multimodal cues for accurate use of chin tuck maneuver. SLP Short Term Goal 3 (Week 1): Pt communicate basic wants and needs via multimodal means with Max A cues. SLP Short Term Goal 4 (Week 1): Pt will verbalize at the word level to achieve 25% intelligibility with Max A multimodal cueing for intelligibility strategies. SLP Short Term Goal 5 (Week 1): Pt will sustain attention to functional tasks for 3-5 minutes with Max A multimodal cues. SLP Short Term Goal 6 (Week 1): Pt will demonstrate ability to initiate and problem solve basic familiar tasks with Max A multimodal cueing.  Skilled Therapeutic Interventions: Pt was seen for skilled ST targeting dysphagia and speech goals. Given on initial MBSS pt was able to prevent aspiration of thins with chin tuck maneuver and it was recommended to conduct additional trials at bedside, SLP provided upgraded trials tsp thin H20 after oral care. Pt required Max A multimodal cueing to execute chin tuck accurately and exhibited wet vocal quality in 2/4 trials. Given his cognitive impairments, he will not be able to utilize this strategy effectively and would recommend continue current diet. Will continue to assess potential for repeat MBSS to determine advancement.  SLP also provided Max A multimodal cues to clarify messages with combination of eye gaze and yes/no head  shakes. Pt expressed nasal congestion but unable to blow his nose himself, therefore spoke with RN regarding any potential for alternative options to alleviate his nasal congestion. Pt also completed 3 reps of 5 EMST exercises with [employer] device set to 5 cm H2O pressure with tactile assist for accurate lip seal and Max A verbal and visual cues for accuracy in exhalations and diaphragmatic breathing. When SLP attempted to increase resistance, pt unable to complete exercises accurately, therefore recommend leaving device set to 5cm H2O. Pt left laying in bed with alarm set and needs within reach. Continue per current plan of care.        Pain Pain Assessment Pain Scale: Faces Pain Score: 9  Faces Pain Scale: Hurts a Adrian bit Pain Type: Chronic pain Pain Location: Back Pain Orientation: Lower Pain Descriptors / Indicators: Discomfort Pain Onset: On-going Patients Stated Pain Goal: 0 Pain Intervention(s): Distraction;Other (Comment)(pt had just recieved pain medication from RN) Multiple Pain Sites: No  Therapy/Group: Individual Therapy  Adrian [name] 06/17/2019, 7:06 AM

## 2019-06-17 NOTE — Progress Notes (Signed)
RT NOTES: CPT held. CPT done through bed and patient not in bed. Will check back at a later time.

## 2019-06-17 NOTE — Progress Notes (Signed)
RT NOTES: CPT held, patient working with therapy at this time.

## 2019-06-17 NOTE — Progress Notes (Signed)
Physical Therapy Session Note  Patient Details  Name: Adrian [name] MRN: [id number] Date of Birth: 1965/08/20  Today's Date: 06/17/2019 PT Individual Time: 1300-1355 PT Individual Time Calculation (min): 55 min   Short Term Goals: Week 1:  PT Short Term Goal 1 (Week 1): Pt will perform bed<>chair transfer with Max assist +2 PT Short Term Goal 2 (Week 1): Pt will maintain static sitting balance EOB with UE support and Mod assist +1 PT Short Term Goal 3 (Week 1): Pt will tolerate x 5 minutes in the standing frame for LE weightbearing and standing tolerance, dependent assist.  Skilled Therapeutic Interventions/Progress Updates:     Pt received supine and nods "yes" to therapy. No report of pain. TotalA +1 for L and R roll to position on maxislide. Dep+2 slide from bed to tilt table. In supine pt's blood pressure 113/59. Pt gradually brought into 80 degrees full upright and blood pressure 102/72. Tilt table used to acclimate pt to upright activity, provide pressure relief, cardiovascular support and endurance training, and WB through BLEs for tone management. Pt performs AROM head turns and flexion extension in upright position. 25 minutes total upright on tilt table.  Return to supine and Dep +2 maxislide transfer back to bed.TotalA +1 for supine to sit for NMR balancing training at EOB. Pt practices static sitting balance with BUEs placed on EOB. Initially pt able to stay upright for 5 seconds at a time with close supervision before losing balance. PT cues for head movements to improve balance and pt eventually maintaining balance up to 30 seconds prior to LOB.  Return to supine with totalA and pt left supine in bed with alarm intact and all needs within reach.  Therapy Documentation Precautions:  Precautions Precautions: Fall, Other (comment) Precaution Comments: check BP on left arm due to occlusion on R side. Restrictions Weight Bearing Restrictions: No   Therapy/Group: Individual  Therapy  Beau Fanny, PT, DPT 06/17/2019, 1:57 PM

## 2019-06-17 NOTE — Progress Notes (Signed)
[facility] PHYSICAL MEDICINE & REHABILITATION PROGRESS NOTE   Subjective/Complaints:  Severe dysarthria   ROS- unable to eval due to aphasia/dysarthria  Objective:   CT ABDOMEN WO CONTRAST  Result Date: 06/16/2019 CLINICAL DATA:  Severe dysplasia. EXAM: CT ABDOMEN WITHOUT CONTRAST TECHNIQUE: Multidetector CT imaging of the abdomen was performed following the standard protocol without IV contrast. COMPARISON:  None. FINDINGS: Lower chest: Mild to moderate severity atelectasis and/or infiltrate is seen within the posterior aspect of the right lung base. Hepatobiliary: No focal liver abnormality is seen. Status post cholecystectomy. No biliary dilatation. Pancreas: Unremarkable. No pancreatic ductal dilatation or surrounding inflammatory changes. Spleen: Normal in size without focal abnormality. Adrenals/Urinary Tract: Adrenal glands are unremarkable. Kidneys are normal in size, without renal calculi or hydronephrosis. A 1.7 cm cyst is seen within the mid to lower right kidney. Stomach/Bowel: Stomach is within normal limits. The appendix is not identified. No evidence of bowel wall thickening, distention, or inflammatory changes. Vascular/Lymphatic: There is moderate severity calcification of the abdominal aorta. No enlarged abdominal lymph nodes. Other: No abdominal wall hernia or abnormality. Musculoskeletal: A few small foci of air density are seen within the subcutaneous fat along the anterolateral aspect of the lower abdominal wall, bilaterally. No acute or significant osseous findings. IMPRESSION: 1. Mild to moderate severity atelectasis and/or infiltrate within the posterior aspect of the right lung base. 2. Evidence of prior cholecystectomy. 3. Small right renal cyst. Aortic Atherosclerosis (ICD10-I70.0). Electronically Signed   By: Aram Candela M.D.   On: 06/16/2019 21:04   No results for input(s): WBC, HGB, HCT, PLT in the last 72 hours. No results for input(s): NA, K, CL, CO2, GLUCOSE,  BUN, CREATININE, CALCIUM in the last 72 hours.  Intake/Output Summary (Last 24 hours) at 06/17/2019 0749 Last data filed at 06/17/2019 0429 Gross per 24 hour  Intake 485 ml  Output 200 ml  Net 285 ml     Physical Exam: Vital Signs Blood pressure 130/77, pulse 73, temperature 97.8 F (36.6 C), resp. rate 14, height 5\' 11"  (1.803 m), weight 66.3 kg, SpO2 95 %.    General: No acute distress Mood and affect are appropriate Heart: Regular rate and rhythm no rubs murmurs or extra sounds Lungs: Clear to auscultation, breathing unlabored, no rales or wheezes Abdomen: Positive bowel sounds, soft nontender to palpation, nondistended Extremities: No clubbing, cyanosis, or edema Skin: No evidence of breakdown, no evidence of rash  Neurologic: Cranial nerves II through XII intact, motor strength is 0/5 in Left deltoid,2- bicep, tricep, 2- grip,and bilateral  hip flexor, knee extensors, ankle dorsiflexor and plantar flexor 2- Right biceps and triceps  Sensory exam normal sensation to light touch and proprioception in bilateral upper and lower extremities Cerebellar exam normal finger to nose to finger as well as heel to shin in bilateral upper and lower extremities Musculoskeletal: Full range of motion in all 4 extremities. No joint swelling Tone is increased MAS 1-2  at the left elbow flexors.  MAS 2-3 in bilateral knee flexors Severe dysarthria  Assessment/Plan: 1. 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  Physiatrist is providing close team supervision and 24 hour management of active medical problems listed below.  Physiatrist and rehab team continue to assess barriers to discharge/monitor patient progress toward functional and medical goals  Care Tool:  Bathing        Body parts bathed by helper: Right  arm, Left arm, Chest, Abdomen, Front  perineal area, Buttocks, Right upper leg, Left upper leg, Right lower leg, Left lower leg, Face     Bathing assist Assist Level: Dependent - Patient 0%     Upper Body Dressing/Undressing Upper body dressing   What is the patient wearing?: Hospital gown only    Upper body assist Assist Level: Dependent - Patient 0%    Lower Body Dressing/Undressing Lower body dressing      What is the patient wearing?: Pants, Incontinence brief     Lower body assist Assist for lower body dressing: Dependent - Patient 0%     Toileting Toileting    Toileting assist Assist for toileting: 2 Helpers     Transfers Chair/bed transfer  Transfers assist  Chair/bed transfer activity did not occur: Safety/medical concerns  Chair/bed transfer assist level: 2 Helpers     Locomotion Ambulation   Ambulation assist   Ambulation activity did not occur: Safety/medical concerns          Walk 10 feet activity   Assist  Walk 10 feet activity did not occur: Safety/medical concerns        Walk 50 feet activity   Assist Walk 50 feet with 2 turns activity did not occur: Safety/medical concerns         Walk 150 feet activity   Assist Walk 150 feet activity did not occur: Safety/medical concerns         Walk 10 feet on uneven surface  activity   Assist Walk 10 feet on uneven surfaces activity did not occur: Safety/medical Engineer, technical sales activity did not occur: Safety/medical concerns         Wheelchair 50 feet with 2 turns activity    Assist    Wheelchair 50 feet with 2 turns activity did not occur: Safety/medical concerns       Wheelchair 150 feet activity     Assist  Wheelchair 150 feet activity did not occur: Safety/medical concerns       Blood pressure 130/77, pulse 73, temperature 97.8 F (36.6 C), resp. rate 14, height 5\' 11"  (1.803 m), weight 66.3 kg, SpO2 95 %.  Medical Problem List and  Plan: 1.Dysarthria/quadriparesissecondary to multifocal infarcts mostly posterior circulation but also bilateral anterior circulation cardioembolic pattern as well as innominate artery occlusion, left ICA thrombus versus soft plaque, right V4 and basal artery occlusive thrombus  CIR PT OT speech,   2. Antithrombotics: -DVT/anticoagulation:Lovenox -antiplatelet therapy: Aspirin 325 mg and Plavix 75 mg daily 3. Pain Management:Tramadol as needed Lumbar pain (location difficult to assess due to communication deficits) Xray unremarkable , technically suboptimal d/t contrast in colon this applies only to lateral images, AP looks OK  4. Mood:Wellbutrin 150 mg twice daily, may need trial of Nuedexta, expect PBA given extensive posterior > anterior circulation infarcts -antipsychotic agents: Seroquel 25 mg 3 times daily as needed 5. Neuropsych: This patientis notcapable of making decisions on hisown behalf.Wife can sign consent for procedure- ie PEG 6. Skin/Wound Care:Routine skin checks 7. Fluids/Electrolytes/Nutrition:Routine in and outs  BMP within acceptable range on 4/26 8. Post stroke dysphagia. Dysphagia #1 honey thick liquids, advance diet as tolerated.  Monitor hydration- expect need for PEG poor prognosis for recovery in short term,  discussed with speech therapy in team conf , also discussed with pt wife, have consulted IR for pladcement - CT abd reviewed, IR needs to determine  if anatomy amenable to PEG Intake 0-25% meals , minimal po fluid intake  9. Orthostatic hypotension. Monitor with increased mobility  Labile on 4/26, continue to monitor  TEDs/Abd binder as needed 10. Hyperlipidemia. Lipitor 11. Tobacco abuse. NicoDerm patch.  Counseled when possible 12. Leukocytosis. -Per CT abd  RLL atelectasis vs PNA, given pt afebrile , WBCs trending down, more likely atelectasis   WBCs 14.7 on 4/26, trending down  Afebrile   Continue to  monitor  LOS: 5 days A FACE TO FACE EVALUATION WAS PERFORMED  Charlett Blake 06/17/2019, 7:49 AM

## 2019-06-17 NOTE — Consult Note (Signed)
WOC Nurse Consult Note: Patient receiving care in [facility] [phone]. Reason for Consult: sacral wounds Wound type: unstageable to sacral/coccyx, apex of the gluteal fold Pressure Injury POA: Yes Measurement: 4.2 cm x 1.2 for the main gluteal fold area and total width including bilateral sacral injuries is 7.2 cm in width Wound bed: yellow and grey Drainage (amount, consistency, odor) none Periwound: bright red erythema, but blanchable  There is a maroon/purple DTPI to the left heel that measures 4 cm x 4 cm and has a circumferential lighter (white) ring around it.  There is a heel foam dressing in place.  This area is to have iodine applied each shift and allow to air dry.  Dressing procedure/placement/frequency:  Apply Santyl to the coccyx and sacral wounds in a nickel thick layer. Cover with a saline moistened gauze, then dry gauze or ABD pad.  Change daily. I have also added an order to maintain the prevalon boots present in the patient room on the feet when the patient is in bed, and an order for a low air loss mattress. Monitor the wound area(s) for worsening of condition such as: Signs/symptoms of infection,  Increase in size,  Development of or worsening of odor, Development of pain, or increased pain at the affected locations.  Notify the medical team if any of these develop.  Thank you for the consult.  Discussed plan of care with the patient and bedside nurse.  WOC nurse will not follow at this time.  Please re-consult the WOC team if needed.  Helmut Muster, RN, MSN, CWOCN, CNS-BC, pager [phone]

## 2019-06-18 ENCOUNTER — Inpatient Hospital Stay (HOSPITAL_COMMUNITY): Payer: Self-pay | Admitting: Occupational Therapy

## 2019-06-18 ENCOUNTER — Inpatient Hospital Stay (HOSPITAL_COMMUNITY): Payer: Self-pay | Admitting: Physical Therapy

## 2019-06-18 ENCOUNTER — Inpatient Hospital Stay (HOSPITAL_COMMUNITY): Payer: Self-pay | Admitting: Speech Pathology

## 2019-06-18 MED ORDER — TIZANIDINE HCL 2 MG PO TABS
2.0000 mg | ORAL_TABLET | Freq: Three times a day (TID) | ORAL | Status: DC
  Administered 2019-06-18 – 2019-06-30 (×37): 2 mg via ORAL
  Filled 2019-06-18 (×37): qty 1

## 2019-06-18 NOTE — Progress Notes (Signed)
RT NOTES: Patient refusing CPT at this time. Will check back later.  

## 2019-06-18 NOTE — Progress Notes (Signed)
Physical Therapy Session Note  Patient Details  Name: Adrian [name] MRN: [id number] Date of Birth: [birth year]  Today's Date: 06/18/2019 PT Individual Time: 1117-1202 PT Individual Time Calculation (min): 45 min   Short Term Goals: Week 1:  PT Short Term Goal 1 (Week 1): Pt will perform bed<>chair transfer with Max assist +2 PT Short Term Goal 2 (Week 1): Pt will maintain static sitting balance EOB with UE support and Mod assist +1 PT Short Term Goal 3 (Week 1): Pt will tolerate x 5 minutes in the standing frame for LE weightbearing and standing tolerance, dependent assist.  Skilled Therapeutic Interventions/Progress Updates: Pt presents sitting in TIS w/c and requesting to return to bed for use of bedpan.  Pt spouse in room and upset that noone has come in to help.  Pt readied to perform slide board transfer w/c > bed.  Pt moving right UE more to assist w/ lean to left to place slideboard under left glut/thigh.  Pt required total assist for sequential transfer w/c > bed w/ re-positioning of feet.  Pt then performed sit to left sidelyin > supine w/ total assist.  Pt performed multiple rolls side to side to place bedpan.  Pt able to assist somewhat w/ roll to left w/ verbal cues for use of right UE to initiate rolling from hook-lying position.  Pt then rolled to remove bedpan w/o BM, although states having the feeling.  Pt required total assist fro managing clothing and brief.  Pt performed LE there ex w/ improved strength noted to right LE especially w/ adductors/IR and hip/knee extension.  Donned B Prevalons and positioned in bed for comfort and all needs in reach.  Spouse present throughout session.  Pt cleared secretions and improved understanding of at least 1 word answers.     Therapy Documentation Precautions:  Precautions Precautions: Fall, Other (comment) Precaution Comments: check BP on left arm due to occlusion on R side. Restrictions Weight Bearing Restrictions: No General:   Vital  Signs:  Pain: Pt grimaces w/ pain w/ some movements, but unable to quantify, but appears < previous. Pain Assessment Pain Scale: Faces Pain Score: 3  Faces Pain Scale: Hurts little more Pain Type: Acute pain Pain Location: Back Pain Descriptors / Indicators: Discomfort Pain Onset: On-going Pain Intervention(s): Repositioned Mobility:   Other Treatments:      Therapy/Group: Individual Therapy  [name] 06/18/2019, 12:37 PM

## 2019-06-18 NOTE — Progress Notes (Signed)
RT NOTES: Patient refusing CPT at this time. Will check back later.

## 2019-06-18 NOTE — Progress Notes (Signed)
Occupational Therapy Session Note  Patient Details  Name: Adrian [name] MRN: [id number] Date of Birth: [birth year]  Today's Date: 06/18/2019 OT Individual Time: 0901-1002 OT Individual Time Calculation (min): 61 min    Short Term Goals: Week 1:  OT Short Term Goal 1 (Week 1): patient will increase ability to roll right/left with max A of one OT Short Term Goal 2 (Week 1): patient will improve grasp (right or left) to allow him to hold / manipulate built up handled utencils OT Short Term Goal 3 (Week 1): patient will improve trunk mobility to increase sitting edge of bed balance with mod A of one  Skilled Therapeutic Interventions/Progress Updates:     Session 1:  ([id number]) Pt in bed to start session with pain meds just administered by nursing.  Therapist provided total assist for oral hygiene secondary to pt finishing drinking thickened cranberry juice prior to session.  Suction brush used to help complete this and manage oral secretions.  He then was flattened out in supine where he worked on Arboriculturist over his brief.  Total assist for threading his LEs through the shorts with total assist also needed for rolling side to side.  Emphasized activation of the abdominal muscles with rolling by bringing his head off of the pillow.  Once complete, he transitioned to sitting with total assist as well.  He needed total +2 (pt 10%) for donning a pullover shirt from the EOB.  He then completed sliding board transfer over to the wheelchair with total assist +2 (pt 10%).  Once in the chair, therapist provided rinseless shower cap for washing his hair with total assist as well as total assist for combing it.  Finished session with work on PROM of the LUE for shoulder flexion as well as AAROM for elbow extension and digit flexion/exttension.  Pt with trace triceps noted as well as 5% digit flexion and extension.  Still with increased tone in the pectoral and the elbow flexors.  Also completed AAROM of the RUE  for shoulder flexion and elbow extension.  One set of 10 reps for all exercises.  Pt left sitting up in the wheelchair with call button and phone in reach and safety belt in place.  [name] present and is familiar with tilting wheelchair back for pressure relief as needed.    Session 2: [phone])  Pt in bed to start session with some noted bowel in continence in his brief.  Worked on rolling side to side for clean up and changing of brief with total assist +2 (pt 10%).  Once complete, he was able to then transfer to the EOB with total assist +2 (pt 10%).  Sliding board transfer was completed to the wheelchair with total +2 (pt 10%) and pt was then taken down to the ortho gym for use of the standing frame.  He completed 2 intervals of standing with BUEs supported on table and total assist for trunk control in the standing frame.  He was able to tolerate 5-6 mins for each standing interval.  Finished session with transfer back to the room and transfer to the bed via sliding board at the same level of total +2 (pt 10%).  Pt left in the bed with spouse in the room and with call button and phone in reach.  PRAFOs in place as well as soft touch call button.    Therapy Documentation Precautions:  Precautions Precautions: Fall, Other (comment) Precaution Comments: check BP on left arm due to occlusion  on R side. Restrictions Weight Bearing Restrictions: No  Pain: Pain Assessment Pain Scale: Faces Faces Pain Scale: Hurts little more Pain Type: Chronic pain Pain Location: Back Pain Descriptors / Indicators: Discomfort Pain Onset: With Activity Pain Intervention(s): Emotional support;Repositioned ADL: See Care Tool Section for some details of mobility and selfcare  Therapy/Group: Individual Therapy  Thomasine Klutts OTR/L 06/18/2019, 11:24 AM

## 2019-06-18 NOTE — Progress Notes (Signed)
Speech Language Pathology Daily Session Note  Patient Details  Name: Adrian [name] MRN: [id number] Date of Birth: 04/22/1965  Today's Date: 06/18/2019 SLP Individual Time: 0730-0800 SLP Individual Time Calculation (min): 30 min  Short Term Goals: Week 1: SLP Short Term Goal 1 (Week 1): Pt will demonstrated efficient mastication and oral clearance of puree solids with Max A multimodal cueing for use of compensaotry swallow strategies. SLP Short Term Goal 2 (Week 1): Pt will consume therapeutic trials of ice and/or thin H2O with adequate oral containment and minimal overt s/sx aspiration with Max A multimodal cues for accurate use of chin tuck maneuver. SLP Short Term Goal 3 (Week 1): Pt communicate basic wants and needs via multimodal means with Max A cues. SLP Short Term Goal 4 (Week 1): Pt will verbalize at the word level to achieve 25% intelligibility with Max A multimodal cueing for intelligibility strategies. SLP Short Term Goal 5 (Week 1): Pt will sustain attention to functional tasks for 3-5 minutes with Max A multimodal cues. SLP Short Term Goal 6 (Week 1): Pt will demonstrate ability to initiate and problem solve basic familiar tasks with Max A multimodal cueing.  Skilled Therapeutic Interventions: Pt was seen for skilled ST targeting dysphagia and speech. Due to lability, pt unable to participate fully in EMST exercises this morning, although he did complete 10 reps with [employer] device set to 5cm H2O resistance with Max A multimodal cues. Once crying ceased, pt agreeable to consume honey thick liquids but adamantly refused puree breakfast solid items. No overt s/sx aspiration observed with tsp honey thick juice. Total A required for self feeling.  Pt able to implement pacing strategy and increased vocal intensity with Max A verbal cues to communicate the following phrase length utterances: "Is there anything else?" and "who is my nurse?". Pt left laying in bed with alarm set and needs within  reach. Continue per current plan of care.        Pain Pain Assessment Pain Scale: Faces Faces Pain Scale: Hurts a Adrian bit Pain Type: Acute pain Pain Location: Back Pain Descriptors / Indicators: Discomfort Pain Onset: On-going Pain Intervention(s): Repositioned  Therapy/Group: Individual Therapy  Adrian [name] 06/18/2019, 7:25 AM

## 2019-06-18 NOTE — Progress Notes (Signed)
Patient refuses to let RT do CPT and Progressa bed

## 2019-06-18 NOTE — Progress Notes (Signed)
[facility] PHYSICAL MEDICINE & REHABILITATION PROGRESS NOTE   Subjective/Complaints:  Remains severely dysarthric.  Per nursing notes has had some spasms.  Tizanidine ordered after reviewing chart Chest PT has not been performed, had been working with therapy at the time on 3 occasions and patient refused on one occasion  ROS- unable to eval due to aphasia/dysarthria  Objective:   CT ABDOMEN WO CONTRAST  Result Date: 06/16/2019 CLINICAL DATA:  Severe dysplasia. EXAM: CT ABDOMEN WITHOUT CONTRAST TECHNIQUE: Multidetector CT imaging of the abdomen was performed following the standard protocol without IV contrast. COMPARISON:  None. FINDINGS: Lower chest: Mild to moderate severity atelectasis and/or infiltrate is seen within the posterior aspect of the right lung base. Hepatobiliary: No focal liver abnormality is seen. Status post cholecystectomy. No biliary dilatation. Pancreas: Unremarkable. No pancreatic ductal dilatation or surrounding inflammatory changes. Spleen: Normal in size without focal abnormality. Adrenals/Urinary Tract: Adrenal glands are unremarkable. Kidneys are normal in size, without renal calculi or hydronephrosis. A 1.7 cm cyst is seen within the mid to lower right kidney. Stomach/Bowel: Stomach is within normal limits. The appendix is not identified. No evidence of bowel wall thickening, distention, or inflammatory changes. Vascular/Lymphatic: There is moderate severity calcification of the abdominal aorta. No enlarged abdominal lymph nodes. Other: No abdominal wall hernia or abnormality. Musculoskeletal: A few small foci of air density are seen within the subcutaneous fat along the anterolateral aspect of the lower abdominal wall, bilaterally. No acute or significant osseous findings. IMPRESSION: 1. Mild to moderate severity atelectasis and/or infiltrate within the posterior aspect of the right lung base. 2. Evidence of prior cholecystectomy. 3. Small right renal cyst. Aortic  Atherosclerosis (ICD10-I70.0). Electronically Signed   By: Aram Candela M.D.   On: 06/16/2019 21:04   No results for input(s): WBC, HGB, HCT, PLT in the last 72 hours. No results for input(s): NA, K, CL, CO2, GLUCOSE, BUN, CREATININE, CALCIUM in the last 72 hours.  Intake/Output Summary (Last 24 hours) at 06/18/2019 0917 Last data filed at 06/18/2019 0856 Gross per 24 hour  Intake 2466.87 ml  Output 1125 ml  Net 1341.87 ml     Physical Exam: Vital Signs Blood pressure (!) 127/98, pulse 96, temperature 97.9 F (36.6 C), resp. rate 18, height 5\' 11"  (1.803 m), weight 66.3 kg, SpO2 96 %.     General: No acute distress Mood and affect are appropriate Heart: Regular rate and rhythm no rubs murmurs or extra sounds Lungs: Clear to auscultation, breathing unlabored, no rales or wheezes, upper airway sounds improved Abdomen: Positive bowel sounds, soft nontender to palpation, nondistended Extremities: No clubbing, cyanosis, or edema Skin: No evidence of breakdown, no evidence of rash   Neurologic: Cranial nerves II through XII intact, motor strength is 0/5 in Left deltoid,2- bicep, tricep, 2- grip,and bilateral  hip flexor, knee extensors, ankle dorsiflexor and plantar flexor 2- Right biceps and triceps  Sensory exam patient does not withdraw to pinch in the upper or lower limbs Cerebellar exam could not perform secondary to weakness Musculoskeletal: Full range of motion in all 4 extremities. No joint swelling Tone is increased MAS 1-2  at the left elbow flexors.  MAS 2-3 in bilateral knee flexors Severe dysarthria  Assessment/Plan: 1. 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  Physiatrist is providing close team supervision and 24 hour management of active medical problems listed below.  Physiatrist and rehab team  continue to assess  barriers to discharge/monitor patient progress toward functional and medical goals  Care Tool:  Bathing        Body parts bathed by helper: Right arm, Left arm, Chest, Abdomen, Front perineal area, Buttocks, Right upper leg, Left upper leg, Right lower leg, Left lower leg, Face     Bathing assist Assist Level: Dependent - Patient 0%     Upper Body Dressing/Undressing Upper body dressing   What is the patient wearing?: Hospital gown only    Upper body assist Assist Level: Dependent - Patient 0%    Lower Body Dressing/Undressing Lower body dressing      What is the patient wearing?: Pants     Lower body assist Assist for lower body dressing: Dependent - Patient 0%     Toileting Toileting    Toileting assist Assist for toileting: 2 Helpers     Transfers Chair/bed transfer  Transfers assist  Chair/bed transfer activity did not occur: Safety/medical concerns  Chair/bed transfer assist level: 2 Helpers(sliding board)     Locomotion Ambulation   Ambulation assist   Ambulation activity did not occur: Safety/medical concerns          Walk 10 feet activity   Assist  Walk 10 feet activity did not occur: Safety/medical concerns        Walk 50 feet activity   Assist Walk 50 feet with 2 turns activity did not occur: Safety/medical concerns         Walk 150 feet activity   Assist Walk 150 feet activity did not occur: Safety/medical concerns         Walk 10 feet on uneven surface  activity   Assist Walk 10 feet on uneven surfaces activity did not occur: Safety/medical concerns         Wheelchair     Assist     Wheelchair activity did not occur: Safety/medical concerns         Wheelchair 50 feet with 2 turns activity    Assist    Wheelchair 50 feet with 2 turns activity did not occur: Safety/medical concerns       Wheelchair 150 feet activity     Assist  Wheelchair 150 feet activity did not occur:  Safety/medical concerns       Blood pressure (!) 127/98, pulse 96, temperature 97.9 F (36.6 C), resp. rate 18, height 5\' 11"  (1.803 m), weight 66.3 kg, SpO2 96 %.  Medical Problem List and Plan: 1.Dysarthria/quadriparesissecondary to multifocal infarcts mostly posterior circulation but also bilateral anterior circulation cardioembolic pattern as well as innominate artery occlusion, left ICA thrombus versus soft plaque, right V4 and basal artery occlusive thrombus  CIR PT OT speech,   2. Antithrombotics: -DVT/anticoagulation:Lovenox -antiplatelet therapy: Aspirin 325 mg and Plavix 75 mg daily 3. Pain Management:Tramadol as needed Lumbar pain (location difficult to assess due to communication deficits) Xray unremarkable , technically suboptimal d/t contrast in colon this applies only to lateral images, AP looks OK , likely bedrest is causing increased pain.  According to family he has had chronic low back pain as well.     4. Mood:Wellbutrin 150 mg twice daily, may need trial of Nuedexta, expect PBA given extensive posterior > anterior circulation infarcts, thus far only occasional lability -antipsychotic agents: Seroquel 25 mg 3 times daily as needed 5. Neuropsych: This patientis notcapable of making decisions on hisown behalf.Wife can sign consent for procedure- ie PEG 6. Skin/Wound Care:Routine skin checks 7. Fluids/Electrolytes/Nutrition:Routine in and outs  BMP  within acceptable range on 4/26 8. Post stroke dysphagia. Dysphagia #1 honey thick liquids, advance diet as tolerated.  Monitor hydration- expect need for PEG poor prognosis for recovery in short term,  discussed with speech therapy in team conf , also discussed with pt wife, have consulted IR for pladcement - CT abd reviewed, IR needs to determine if anatomy amenable to PEG Intake 0-25% meals , minimal po fluid intake, continue IV fluids 9. Orthostatic hypotension. Monitor with increased  mobility Vitals:   06/17/19 1948 06/18/19 0346  BP: (!) 158/81 (!) 127/98  Pulse: 90 96  Resp: 20 18  Temp: 97.9 F (36.6 C) 97.9 F (36.6 C)  SpO2: 95% 96%    Labile on 4/ 30, continue to monitor  TEDs/Abd binder as needed 10. Hyperlipidemia. Lipitor 11. Tobacco abuse. NicoDerm patch.  Counseled when possible 12. Leukocytosis. -Per CT abd  RLL atelectasis vs PNA, given pt afebrile , WBCs trending down, more likely atelectasis   WBCs 14.7 on 4/26, trending down  Afebrile   Continue to monitor  LOS: 6 days A FACE TO FACE EVALUATION WAS PERFORMED  Charlett Blake 06/18/2019, 9:17 AM

## 2019-06-18 NOTE — Progress Notes (Signed)
Pt having increase restlessness, having multiple leg spasms and pain to the buttocks No PRNs available at this time. Pillows placed under the pt per request from the pt. Pt awake and resting in bed at this time.

## 2019-06-18 NOTE — Progress Notes (Signed)
Pt appears to be restless and uncomfortable. Pt was repositioned and given PRN medication for pain. Pt was tearful and crying. When asking the pt about the feelings and emotions he was having, pt gave non verbal agreement to being sad and miserable. Pt resting at this time in bed.

## 2019-06-19 DIAGNOSIS — G8929 Other chronic pain: Secondary | ICD-10-CM

## 2019-06-19 DIAGNOSIS — M545 Low back pain, unspecified: Secondary | ICD-10-CM

## 2019-06-19 DIAGNOSIS — I951 Orthostatic hypotension: Secondary | ICD-10-CM

## 2019-06-19 DIAGNOSIS — I69391 Dysphagia following cerebral infarction: Secondary | ICD-10-CM

## 2019-06-19 LAB — CREATININE, SERUM
Creatinine, Ser: 0.8 mg/dL (ref 0.61–1.24)
GFR calc Af Amer: >60 mL/min (ref >60)
GFR calc non Af Amer: >60 mL/min (ref >60)

## 2019-06-19 NOTE — Progress Notes (Signed)
Pt has an note on headboard to wear splints for arms. No splints were located in room, by nurse or tech

## 2019-06-19 NOTE — Plan of Care (Signed)
  Problem: Consults Goal: RH STROKE PATIENT EDUCATION Description: See Patient Education module for education specifics  Outcome: Progressing   Problem: RH BOWEL ELIMINATION Goal: RH STG MANAGE BOWEL WITH ASSISTANCE Description: STG Manage Bowel with mod Assistance. Outcome: Progressing Goal: RH STG MANAGE BOWEL W/MEDICATION W/ASSISTANCE Description: STG Manage Bowel with Medication with mod Assistance. Outcome: Progressing   Problem: RH BLADDER ELIMINATION Goal: RH STG MANAGE BLADDER WITH ASSISTANCE Description: STG Manage Bladder With mod Assistance Outcome: Progressing   Problem: RH SKIN INTEGRITY Goal: RH STG SKIN FREE OF INFECTION/BREAKDOWN Outcome: Progressing Goal: RH STG MAINTAIN SKIN INTEGRITY WITH ASSISTANCE Description: STG Maintain Skin Integrity With mod Assistance. Outcome: Progressing Goal: RH STG ABLE TO PERFORM INCISION/WOUND CARE W/ASSISTANCE Description: STG Able To Perform Incision/Wound Care With max Assistance. Outcome: Progressing   Problem: RH COGNITION-NURSING Goal: RH STG USES MEMORY AIDS/STRATEGIES W/ASSIST TO PROBLEM SOLVE Description: STG Uses Memory Aids/Strategies With min Assistance to Problem Solve. Outcome: Progressing Goal: RH STG ANTICIPATES NEEDS/CALLS FOR ASSIST W/ASSIST/CUES Description: STG Anticipates Needs/Calls for Assist With min Assistance/Cues. Outcome: Progressing

## 2019-06-19 NOTE — Progress Notes (Signed)
[facility] PHYSICAL MEDICINE & REHABILITATION PROGRESS NOTE   Subjective/Complaints: Patient seen laying in bed this morning.  Overnight patient refusing respiratory therapy interventions, no other issues reported.  ROS: Limited due to aphasia/dysarthria  Objective:   No results found. No results for input(s): WBC, HGB, HCT, PLT in the last 72 hours. Recent Labs    06/19/19 0537  CREATININE 0.80    Intake/Output Summary (Last 24 hours) at 06/19/2019 1230 Last data filed at 06/19/2019 0519 Gross per 24 hour  Intake 0 ml  Output 550 ml  Net -550 ml     Physical Exam: Vital Signs Blood pressure 136/79, pulse 81, temperature 98.2 F (36.8 C), resp. rate 18, height 5\' 11"  (1.803 m), weight 66.3 kg, SpO2 95 %. Constitutional: No distress . Vital signs reviewed. HENT: Normocephalic.  Atraumatic. Eyes: EOMI. No discharge. Cardiovascular: No JVD. Respiratory: Normal effort.  No stridor. GI: Non-distended. Skin: Warm and dry.  Intact. Psych: Normal mood.  Normal behavior. Musc: No edema in extremities.  No tenderness in extremities. Neurologic: Alert Motor: Limited due to patient's ability to follow commands Dysarthria  Assessment/Plan: 1. 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  Physiatrist is providing close team supervision and 24 hour management of active medical problems listed below.  Physiatrist and rehab team continue to assess barriers to discharge/monitor patient progress toward functional and medical goals  Care Tool:  Bathing        Body parts bathed by helper: Right arm, Left arm, Chest, Abdomen, Front perineal area, Buttocks, Right upper leg, Left upper leg, Right lower leg, Left lower leg, Face     Bathing assist Assist Level: Dependent - Patient 0%     Upper Body Dressing/Undressing Upper body dressing   What  is the patient wearing?: Pull over shirt    Upper body assist Assist Level: 2 Helpers    Lower Body Dressing/Undressing Lower body dressing      What is the patient wearing?: Pants     Lower body assist Assist for lower body dressing: Total Assistance - Patient < 25%     Toileting Toileting    Toileting assist Assist for toileting: 2 Helpers     Transfers Chair/bed transfer  Transfers assist  Chair/bed transfer activity did not occur: Safety/medical concerns  Chair/bed transfer assist level: Dependent - Patient 0%     Locomotion Ambulation   Ambulation assist   Ambulation activity did not occur: Safety/medical concerns          Walk 10 feet activity   Assist  Walk 10 feet activity did not occur: Safety/medical concerns        Walk 50 feet activity   Assist Walk 50 feet with 2 turns activity did not occur: Safety/medical concerns         Walk 150 feet activity   Assist Walk 150 feet activity did not occur: Safety/medical concerns         Walk 10 feet on uneven surface  activity   Assist Walk 10 feet on uneven surfaces activity did not occur: Safety/medical concerns         Wheelchair     Assist     Wheelchair activity did not occur: Safety/medical concerns         Wheelchair 50 feet with 2 turns activity    Assist    Wheelchair 50 feet with 2 turns activity did  not occur: Safety/medical concerns       Wheelchair 150 feet activity     Assist  Wheelchair 150 feet activity did not occur: Safety/medical concerns       Blood pressure 136/79, pulse 81, temperature 98.2 F (36.8 C), resp. rate 18, height 5\' 11"  (1.803 m), weight 66.3 kg, SpO2 95 %.  Medical Problem List and Plan: 1.Dysarthria/quadriparesissecondary to multifocal infarcts mostly posterior circulation but also bilateral anterior circulation cardioembolic pattern as well as innominate artery occlusion, left ICA thrombus versus soft plaque,  right V4 and basal artery occlusive thrombus  Continue CIR 2. Antithrombotics: -DVT/anticoagulation:Lovenox  Creatinine within normal limits on 5/1 -antiplatelet therapy: Aspirin 325 mg and Plavix 75 mg daily 3. Pain Management:Tramadol as needed  Lumbar pain (location difficult to assess due to communication deficits) Xray unremarkable , technically suboptimal d/t contrast in colon this applies only to lateral images, AP looks OK , likely bedrest is causing increased pain.  According to family he has had chronic low back pain as well.      Appears relatively controlled on 5/1  4. Mood:Wellbutrin 150 mg twice daily, may need trial of Nuedexta, expect PBA given extensive posterior > anterior circulation infarcts -antipsychotic agents: Seroquel 25 mg 3 times daily as needed 5. Neuropsych: This patientis notcapable of making decisions on hisown behalf.  6. Skin/Wound Care:Routine skin checks 7. Fluids/Electrolytes/Nutrition:Routine in and outs  BMP within acceptable range on 4/26 8. Post stroke dysphagia. Dysphagia #1 honey thick liquids, advance diet as tolerated.    Consulted IR for PEG placement - IR needs to determine if anatomy amenable to PEG  Continue IVF 9. Orthostatic hypotension. Monitor with increased mobility Vitals:   06/18/19 2100 06/19/19 0511  BP:  136/79  Pulse: 84 81  Resp: 16 18  Temp:  98.2 F (36.8 C)  SpO2: 92% 95%    Controlled on 5/1  10. Hyperlipidemia. Lipitor 11. Tobacco abuse. NicoDerm patch.  Counseled when possible 12. Leukocytosis. -Per CT abd  RLL atelectasis vs PNA, given pt afebrile , WBCs trending down, more likely atelectasis   WBCs 14.7 on 4/26, labs ordered for Monday  Afebrile   Continue to monitor  LOS: 7 days A FACE TO FACE EVALUATION WAS PERFORMED  Adrian [name] Thursday 06/19/2019, 12:30 PM

## 2019-06-20 ENCOUNTER — Inpatient Hospital Stay (HOSPITAL_COMMUNITY): Payer: Self-pay | Admitting: Speech Pathology

## 2019-06-20 DIAGNOSIS — R030 Elevated blood-pressure reading, without diagnosis of hypertension: Secondary | ICD-10-CM

## 2019-06-20 NOTE — Plan of Care (Signed)
  Problem: Consults Goal: RH STROKE PATIENT EDUCATION Description: See Patient Education module for education specifics  Outcome: Progressing   Problem: RH BOWEL ELIMINATION Goal: RH STG MANAGE BOWEL WITH ASSISTANCE Description: STG Manage Bowel with mod Assistance. Outcome: Progressing Goal: RH STG MANAGE BOWEL W/MEDICATION W/ASSISTANCE Description: STG Manage Bowel with Medication with mod Assistance. Outcome: Progressing   Problem: RH BLADDER ELIMINATION Goal: RH STG MANAGE BLADDER WITH ASSISTANCE Description: STG Manage Bladder With mod Assistance Outcome: Progressing   Problem: RH SKIN INTEGRITY Goal: RH STG MAINTAIN SKIN INTEGRITY WITH ASSISTANCE Description: STG Maintain Skin Integrity With mod Assistance. Outcome: Progressing Goal: RH STG ABLE TO PERFORM INCISION/WOUND CARE W/ASSISTANCE Description: STG Able To Perform Incision/Wound Care With max Assistance. Outcome: Progressing   Problem: RH SAFETY Goal: RH STG ADHERE TO SAFETY PRECAUTIONS W/ASSISTANCE/DEVICE Description: STG Adhere to Safety Precautions With min Assistance/Device. Outcome: Progressing   Problem: RH COGNITION-NURSING Goal: RH STG USES MEMORY AIDS/STRATEGIES W/ASSIST TO PROBLEM SOLVE Description: STG Uses Memory Aids/Strategies With min Assistance to Problem Solve. Outcome: Progressing Goal: RH STG ANTICIPATES NEEDS/CALLS FOR ASSIST W/ASSIST/CUES Description: STG Anticipates Needs/Calls for Assist With min Assistance/Cues. Outcome: Progressing   Problem: RH PAIN MANAGEMENT Goal: RH STG PAIN MANAGED AT OR BELOW PT'S PAIN GOAL Description: Less than 3 out of 10 Outcome: Progressing   Problem: RH KNOWLEDGE DEFICIT Goal: RH STG INCREASE KNOWLEDGE OF HYPERTENSION Description: Patient/family will be able to identify medication to treat HTN along with 2 other nonpharmalogical interventions Outcome: Progressing Goal: RH STG INCREASE KNOWLEDGE OF DYSPHAGIA/FLUID INTAKE Description: Patient will be  able to demonstrate understanding of thickened fluid necessity Outcome: Progressing Goal: RH STG INCREASE KNOWLEDGE OF STROKE PROPHYLAXIS Description: Patient/Family will be able to verbalize to 3 medications for stroke prevention post discharge  Outcome: Progressing   

## 2019-06-20 NOTE — Progress Notes (Signed)
[facility] PHYSICAL MEDICINE & REHABILITATION PROGRESS NOTE   Subjective/Complaints: Patient seen sitting up in his bed this morning.  No reported issues overnight.  He indicates he slept well.  He is more engaging and able to nonverbally communicate this morning.  ROS: Limited due to dysarthria/expressive aphasia  Objective:   No results found. No results for input(s): WBC, HGB, HCT, PLT in the last 72 hours. Recent Labs    06/19/19 0537  CREATININE 0.80    Intake/Output Summary (Last 24 hours) at 06/20/2019 0929 Last data filed at 06/20/2019 0002 Gross per 24 hour  Intake 20 ml  Output 850 ml  Net -830 ml     Physical Exam: Vital Signs Blood pressure (!) 134/97, pulse 89, temperature 98.5 F (36.9 C), resp. rate 18, height 5\' 11"  (1.803 m), weight 66.3 kg, SpO2 93 %. Constitutional: No distress . Vital signs reviewed. HENT: Normocephalic.  Atraumatic. Eyes: EOMI. No discharge. Cardiovascular: No JVD. Respiratory: Normal effort.  No stridor. GI: Non-distended. Skin: Warm and dry.  Intact. Psych: Limited due to aphasia Musc: No edema in extremities.  No tenderness in extremities. Neurologic: Alert Motor: Better ability to follow commands, moving right upper extremity with limitations, no movement noted in left upper extremity Dysarthria Expressive aphasia  Assessment/Plan: 1. 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  Physiatrist is providing close team supervision and 24 hour management of active medical problems listed below.  Physiatrist and rehab team continue to assess barriers to discharge/monitor patient progress toward functional and medical goals  Care Tool:  Bathing        Body parts bathed by helper: Right arm, Left arm, Chest, Abdomen, Front perineal area, Buttocks, Right upper leg, Left upper leg, Right lower  leg, Left lower leg, Face     Bathing assist Assist Level: Dependent - Patient 0%     Upper Body Dressing/Undressing Upper body dressing   What is the patient wearing?: Pull over shirt    Upper body assist Assist Level: Dependent - Patient 0%    Lower Body Dressing/Undressing Lower body dressing      What is the patient wearing?: Incontinence brief     Lower body assist Assist for lower body dressing: Dependent - Patient 0%     Toileting Toileting    Toileting assist Assist for toileting: 2 Helpers     Transfers Chair/bed transfer  Transfers assist  Chair/bed transfer activity did not occur: Safety/medical concerns  Chair/bed transfer assist level: Dependent - Patient 0%     Locomotion Ambulation   Ambulation assist   Ambulation activity did not occur: Safety/medical concerns          Walk 10 feet activity   Assist  Walk 10 feet activity did not occur: Safety/medical concerns        Walk 50 feet activity   Assist Walk 50 feet with 2 turns activity did not occur: Safety/medical concerns         Walk 150 feet activity   Assist Walk 150 feet activity did not occur: Safety/medical concerns         Walk 10 feet on uneven surface  activity   Assist Walk 10 feet on uneven surfaces activity did not occur: Safety/medical concerns         Wheelchair     Assist     Wheelchair activity did not occur: Safety/medical concerns  Wheelchair 50 feet with 2 turns activity    Assist    Wheelchair 50 feet with 2 turns activity did not occur: Safety/medical concerns       Wheelchair 150 feet activity     Assist  Wheelchair 150 feet activity did not occur: Safety/medical concerns       Blood pressure (!) 134/97, pulse 89, temperature 98.5 F (36.9 C), resp. rate 18, height 5\' 11"  (1.803 m), weight 66.3 kg, SpO2 93 %.  Medical Problem List and Plan: 1.Dysarthria/quadriparesis/aphasiasecondary to multifocal  infarcts mostly posterior circulation but also bilateral anterior circulation cardioembolic pattern as well as innominate artery occlusion, left ICA thrombus versus soft plaque, right V4 and basal artery occlusive thrombus  Continue CIR 2. Antithrombotics: -DVT/anticoagulation:Lovenox  Creatinine within normal limits on 5/1 -antiplatelet therapy: Aspirin 325 mg and Plavix 75 mg daily 3. Pain Management:Tramadol as needed  Lumbar pain (location difficult to assess due to communication deficits) Xray unremarkable , technically suboptimal d/t contrast in colon this applies only to lateral images, AP looks OK , likely bedrest is causing increased pain.  According to family he has had chronic low back pain as well.      Appears controlled with meds on 5/2  4. Mood:Wellbutrin 150 mg twice daily, may need trial of Nuedexta, expect PBA given extensive posterior > anterior circulation infarcts -antipsychotic agents: Seroquel 25 mg 3 times daily as needed 5. Neuropsych: This patientis notcapable of making decisions on hisown behalf.  6. Skin/Wound Care:Routine skin checks 7. Fluids/Electrolytes/Nutrition:Routine in and outs  BMP within acceptable range on 4/26 8. Post stroke dysphagia. Dysphagia #1 honey thick liquids, advance diet as tolerated.    Consulted IR for PEG placement - IR needs to determine if anatomy amenable to PEG  Continue IVF 9. Orthostatic hypotension. Monitor with increased mobility Vitals:   06/19/19 2035 06/20/19 0540  BP: 133/81 (!) 134/97  Pulse: 84 89  Resp: 17 18  Temp: 98 F (36.7 C) 98.5 F (36.9 C)  SpO2: 98% 93%    Diastolic elevated this morning, otherwise relatively controlled on 5/2 10. Hyperlipidemia. Lipitor 11. Tobacco abuse. NicoDerm patch.  Counseled when possible 12. Leukocytosis. -Per CT abd  RLL atelectasis vs PNA, given pt afebrile , WBCs trending down, more likely atelectasis   WBCs 14.7 on 4/26, labs ordered  for tomorrow  Afebrile   Continue to monitor  LOS: 8 days A FACE TO FACE EVALUATION WAS PERFORMED  Briton Sellman 5/26 06/20/2019, 9:29 AM

## 2019-06-20 NOTE — Progress Notes (Signed)
Speech Language Pathology Daily Session Note  Patient Details  Name: Adrian [name] MRN: [id number] Date of Birth: [birth year]  Today's Date: 06/20/2019 SLP Individual Time: 1435-1530 SLP Individual Time Calculation (min): 55 min  Short Term Goals: Week 1: SLP Short Term Goal 1 (Week 1): Pt will demonstrated efficient mastication and oral clearance of puree solids with Max A multimodal cueing for use of compensaotry swallow strategies. SLP Short Term Goal 2 (Week 1): Pt will consume therapeutic trials of ice and/or thin H2O with adequate oral containment and minimal overt s/sx aspiration with Max A multimodal cues for accurate use of chin tuck maneuver. SLP Short Term Goal 3 (Week 1): Pt communicate basic wants and needs via multimodal means with Max A cues. SLP Short Term Goal 4 (Week 1): Pt will verbalize at the word level to achieve 25% intelligibility with Max A multimodal cueing for intelligibility strategies. SLP Short Term Goal 5 (Week 1): Pt will sustain attention to functional tasks for 3-5 minutes with Max A multimodal cues. SLP Short Term Goal 6 (Week 1): Pt will demonstrate ability to initiate and problem solve basic familiar tasks with Max A multimodal cueing.  Skilled Therapeutic Interventions:  Pt was seen for skilled ST targeting goals for dysphagia and communication.  Upon arrival, pt was adamant that he was not going to eat any food from his lunch tray but was agreeable to a snack of pureed textures and honey thick liquids via teaspoon from floor stock.  Pt consumed ~4 bites of magic cup ice cream snack and ~3 teaspoons of honey thick coke before requesting to lay back down due to pain which he described as on his buttocks due to "bedsores."   No overt s/s of aspiration were noted with minimal presentations of POs today and pt was able to clear purees and thickened liquids from the oral cavity completely with increased time.  After PO trials, pt became somewhat perseverative on  "bedsores" and wanting to leave the hospital.  Pt was rather animated when talking and tried to talk at a rapid pace, requiring max to total assist to slow rate to achieve intelligibility in ~25% of opportunities at the word level.  Wife returned to room at the end of today's therapy session and she appeared to have a calming effect on pt and he even briefly laughed and smiled in response to his own humor.  Pt was left in bed with bed alarm set and call bell within reach.  Wife at bedside.  Continue per current plan of care.    Pain Pain Assessment Pain Scale: Faces Faces Pain Scale: Hurts even more Pain Type: Acute pain Pain Location: Buttocks Pain Descriptors / Indicators: Aching Pain Intervention(s): Repositioned  Therapy/Group: Individual Therapy  Kemi Gell, Melanee Spry 06/20/2019, 3:57 PM

## 2019-06-20 NOTE — Plan of Care (Signed)
  Problem: Consults Goal: RH STROKE PATIENT EDUCATION Description: See Patient Education module for education specifics  Outcome: Progressing   Problem: RH BOWEL ELIMINATION Goal: RH STG MANAGE BOWEL WITH ASSISTANCE Description: STG Manage Bowel with mod Assistance. Outcome: Progressing Goal: RH STG MANAGE BOWEL W/MEDICATION W/ASSISTANCE Description: STG Manage Bowel with Medication with mod Assistance. Outcome: Progressing   Problem: RH BLADDER ELIMINATION Goal: RH STG MANAGE BLADDER WITH ASSISTANCE Description: STG Manage Bladder With mod Assistance Outcome: Progressing   Problem: RH SKIN INTEGRITY Goal: RH STG SKIN FREE OF INFECTION/BREAKDOWN Outcome: Progressing Goal: RH STG MAINTAIN SKIN INTEGRITY WITH ASSISTANCE Description: STG Maintain Skin Integrity With mod Assistance. Outcome: Progressing Goal: RH STG ABLE TO PERFORM INCISION/WOUND CARE W/ASSISTANCE Description: STG Able To Perform Incision/Wound Care With max Assistance. Outcome: Progressing   Problem: RH SAFETY Goal: RH STG ADHERE TO SAFETY PRECAUTIONS W/ASSISTANCE/DEVICE Description: STG Adhere to Safety Precautions With min Assistance/Device. Outcome: Progressing   Problem: RH COGNITION-NURSING Goal: RH STG USES MEMORY AIDS/STRATEGIES W/ASSIST TO PROBLEM SOLVE Description: STG Uses Memory Aids/Strategies With min Assistance to Problem Solve. Outcome: Progressing Goal: RH STG ANTICIPATES NEEDS/CALLS FOR ASSIST W/ASSIST/CUES Description: STG Anticipates Needs/Calls for Assist With min Assistance/Cues. Outcome: Progressing

## 2019-06-21 ENCOUNTER — Inpatient Hospital Stay (HOSPITAL_COMMUNITY): Payer: Self-pay | Admitting: Speech Pathology

## 2019-06-21 ENCOUNTER — Inpatient Hospital Stay (HOSPITAL_COMMUNITY): Payer: Self-pay | Admitting: Occupational Therapy

## 2019-06-21 ENCOUNTER — Inpatient Hospital Stay (HOSPITAL_COMMUNITY): Payer: Self-pay

## 2019-06-21 LAB — CBC WITH DIFFERENTIAL/PLATELET
Abs Immature Granulocytes: 0.12 10*3/uL — ABNORMAL HIGH (ref 0.00–0.07)
Basophils Absolute: 0.1 10*3/uL (ref 0.0–0.1)
Basophils Relative: 1 %
Eosinophils Absolute: 0.4 10*3/uL (ref 0.0–0.5)
Eosinophils Relative: 2 %
HCT: 39.2 % (ref 39.0–52.0)
Hemoglobin: 13.4 g/dL (ref 13.0–17.0)
Immature Granulocytes: 1 %
Lymphocytes Relative: 26 %
Lymphs Abs: 4 10*3/uL (ref 0.7–4.0)
MCH: 31.1 pg (ref 26.0–34.0)
MCHC: 34.2 g/dL (ref 30.0–36.0)
MCV: 91 fL (ref 80.0–100.0)
Monocytes Absolute: 0.8 10*3/uL (ref 0.1–1.0)
Monocytes Relative: 5 %
Neutro Abs: 10 10*3/uL — ABNORMAL HIGH (ref 1.7–7.7)
Neutrophils Relative %: 65 %
Platelets: 540 10*3/uL — ABNORMAL HIGH (ref 150–400)
RBC: 4.31 MIL/uL (ref 4.22–5.81)
RDW: 14.3 % (ref 11.5–15.5)
WBC: 15.3 10*3/uL — ABNORMAL HIGH (ref 4.0–10.5)
nRBC: 0 % (ref 0.0–0.2)

## 2019-06-21 NOTE — Progress Notes (Signed)
Speech Language Pathology Weekly Progress and Session Note  Patient Details  Name: Adrian [name] MRN: [id number] Date of Birth: 02/28/1965  Beginning of progress report period: June 13, 2019 End of progress report period: Jun 21, 2019  Today's Date: 06/21/2019 SLP Individual Time: 0731-0801 SLP Individual Time Calculation (min): 30 min  Short Term Goals: Week 1: SLP Short Term Goal 1 (Week 1): Pt will demonstrated efficient mastication and oral clearance of puree solids with Max A multimodal cueing for use of compensaotry swallow strategies. SLP Short Term Goal 1 - Progress (Week 1): Met SLP Short Term Goal 2 (Week 1): Pt will consume therapeutic trials of ice and/or thin H2O with adequate oral containment and minimal overt s/sx aspiration with Max A multimodal cues for accurate use of chin tuck maneuver. SLP Short Term Goal 2 - Progress (Week 1): Progressing toward goal SLP Short Term Goal 3 (Week 1): Pt communicate basic wants and needs via multimodal means with Max A cues. SLP Short Term Goal 3 - Progress (Week 1): Met SLP Short Term Goal 4 (Week 1): Pt will verbalize at the word level to achieve 25% intelligibility with Max A multimodal cueing for intelligibility strategies. SLP Short Term Goal 4 - Progress (Week 1): Met SLP Short Term Goal 5 (Week 1): Pt will sustain attention to functional tasks for 3-5 minutes with Max A multimodal cues. SLP Short Term Goal 5 - Progress (Week 1): Met SLP Short Term Goal 6 (Week 1): Pt will demonstrate ability to initiate and problem solve basic familiar tasks with Max A multimodal cueing. SLP Short Term Goal 6 - Progress (Week 1): Progressing toward goal    New Short Term Goals: Week 2: SLP Short Term Goal 1 (Week 2): Pt will consume trials of dysphagia 2 solids with efficient mastication and oral clearance X4 with Mod A cues for use of swallow strategies. SLP Short Term Goal 2 (Week 2): Pt will consume therapeutic trials of ice and/or thin H2O with  adequate oral containment and minimal overt s/sx aspiration with Max A multimodal cues for accurate use of chin tuck maneuver. SLP Short Term Goal 3 (Week 2): Pt will verbalize at the word level to achieve 30% intelligibility with Max A multimodal cueing for intelligibility strategies. SLP Short Term Goal 4 (Week 2): Pt communicate basic wants and needs via multimodal means with Mod A cues. SLP Short Term Goal 5 (Week 2): Pt will sustain attention to functional tasks for 5-7 minutes with Max A multimodal cues. SLP Short Term Goal 6 (Week 2): Pt will perform 25 reps of EMST exercises to increase breath support and voice with Mod A multimodal cues for accuracy.  Weekly Progress Updates: Pt has made slow but functional gains this reporting period, meeting 4 out of 6 short term goals. Pt is currently Max assist for basic cognitive tasks, PO intake and use of swallow strategies, as well functional communication (verbal and multimodal) due to severe dysarthria, oropharyngeal dysphagia, and moderate cognitive impairments impacting his sustained attention, awareness, and problem solving. He has also been limited by pain, requiring Max A encouragement for participation. He is intermittently labile. Pt is consuming puree (dysphagia 1) diet with honey thick liquids, with Max-Total A for self feeding and use of swallow strategies to minimize aspiration and oral clearance. His overall PO intake is very limited and therefore he may require alternative means of nutrition, long term, while working toward rehabilitation or oropharyngeal swallow function. Pt and family education is ongoing. Pt would  continue to benefit from skilled ST while inpatient in order to maximize functional independence and reduce burden of care prior to discharge. Anticipate that pt will need 24/7 supervision at discharge in addition to [facility] follow up at next level of care.       Intensity: Minumum of 1-2 x/day, 30 to 90 minutes Frequency: 3 to 5  out of 7 days Duration/Length of Stay: 07/09/19 Treatment/Interventions: Cognitive remediation/compensation;Cueing hierarchy;Dysphagia/aspiration precaution training;Functional tasks;Patient/family education;Environmental controls;Internal/external aids;Speech/Language facilitation;Multimodal communication approach   Daily Session  Skilled Therapeutic Interventions: Pt was seen for skilled ST targeting dysphagia and speech goals. Pt refused all solid PO intake this morning, but agreeable to trials of thin H2O with SLP. Pt able to participate in oral care more than previous enconters, but still limited by physical impairments, endurance, and attention. He accepted 6 tsp of thin H2O, with strong immediate cough response noted X1 when pt did not perform a chin tuck prior to swallow initiation. Recommend continue current diet.  Pt performed 25 reps (2 sets of 10 and 1 set of 5) of EMST exercises with device set to 6cm H2O resistance and Max A verbal cues for accuracy, only Min A tactile cues for lip seal. Of note, device was increased to 6cm H2O resistance today in comparison to 5cm H2O in previous sessions.  Pt used pacing and increased vocal intensity to communicate functional information including new report of a tooth ache and to inquire about the rest of his scheduled therapy sessions with overall Max A verbal cues. Pt was ~30% intelligible at the word level throughout session. Pt left laying in bed with alarm set and needs within reach. Continue per current plan of care.        Pain Pain Assessment Pain Scale: Faces Faces Pain Scale: Hurts little more Pain Type: Acute pain Pain Location: Buttocks Pain Orientation: Lower Pain Descriptors / Indicators: Sore Pain Onset: On-going Pain Intervention(s): Repositioned;Distraction Multiple Pain Sites: No  Therapy/Group: Individual Therapy  Arbutus Leas 06/21/2019, 7:04 AM

## 2019-06-21 NOTE — Progress Notes (Signed)
Occupational Therapy Session Note  Patient Details  Name: Adrian [name] MRN: [id number] Date of Birth: 01-Dec-1965  Today's Date: 06/21/2019 OT Individual Time: 5732-2025 OT Individual Time Calculation (min): 42 min    Short Term Goals: Week 1:  OT Short Term Goal 1 (Week 1): patient will increase ability to roll right/left with max A of one OT Short Term Goal 2 (Week 1): patient will improve grasp (right or left) to allow him to hold / manipulate built up handled utencils OT Short Term Goal 3 (Week 1): patient will improve trunk mobility to increase sitting edge of bed balance with mod A of one  Skilled Therapeutic Interventions/Progress Updates:    Upon entering the room, pt seated in tilt in space wheelchair and reports, " My ass hurts". Pt positioned with step under feet and placed on beasy board for transfer with total A +2 for placement and transfer. Beasy board utilized to decrease fiction on buttocks secondary to skin integrity concerns. Sit >supine with +2 for safety. AAROM for R UE and PROM/stretching for L UE in all planes of movement. Plan for air mattress some time today. Pt placed into R side lying position with refusal initially from pt and him needing extensive education about why position was important. Pt reports some relief but that buttocks is still painful. Pt requests [name] "arm work" as therapist is leaving the room and son began stretching L UE without cuing needed for technique. Call bell and all needed items within reach upon exiting the room. New cushion placed in room to be addressed for wheelchair at next session. Son and wife remain present in room at end of session.   Therapy Documentation Precautions:  Precautions Precautions: Fall, Other (comment) Precaution Comments: check BP on left arm due to occlusion on R side. Restrictions Weight Bearing Restrictions: No ADL: ADL Eating: Dependent Where Assessed-Eating: Bed level Grooming: Dependent Where  Assessed-Grooming: Bed level Upper Body Bathing: Dependent Where Assessed-Upper Body Bathing: Bed level Lower Body Bathing: Dependent Where Assessed-Lower Body Bathing: Bed level Upper Body Dressing: Dependent Where Assessed-Upper Body Dressing: Bed level Lower Body Dressing: Dependent Where Assessed-Lower Body Dressing: Bed level Toileting: Dependent Where Assessed-Toileting: Bed level   Therapy/Group: Individual Therapy  Alen Bleacher 06/21/2019, 11:48 AM

## 2019-06-21 NOTE — Consult Note (Signed)
Chief Complaint: Patient was seen in consultation today for percutaneous gastric tube placment at the request of Dr Carleene Overlie  Supervising Physician: Oley Balm  Patient Status: [facility] - In-pt  History of Present Illness: Adrian [name] is a 54 y.o. male   CVA: multifocal infarcts mostly posterior circulation but also bilateral anterior circulation cardioembolic pattern as well as innominate artery occlusion, left ICA thrombus versus soft plaque, right V4 and basal artery occlusive thrombus On Plavix and ASA 325 daily Basilar artery occlusion Quadriparesis; aphasia Dysphagia Deconditioning Malnutrition Need for long term care  Request for percutaneous gastric tube placement  IR aware Imaging has been approved for G tube placement National Back order for G tubes at this time--- cannot promise time frame for placement We will set up asap when can   Past Medical History:  Diagnosis Date  . Tobacco abuse     No past surgical history on file.  Allergies: Codeine, Motrin [ibuprofen], and Penicillins  Medications: Prior to Admission medications   Medication Sig Start Date End Date Taking? Authorizing Provider  atorvastatin (LIPITOR) 80 MG tablet Take 1 tablet (80 mg total) by mouth daily. 06/13/19   Dana Allan, MD  buPROPion (WELLBUTRIN) 75 MG tablet Take 2 tablets (150 mg total) by mouth 2 (two) times daily. 06/12/19   Dana Allan, MD  clopidogrel (PLAVIX) 75 MG tablet Take 1 tablet (75 mg total) by mouth daily. 06/13/19   Dana Allan, MD  gabapentin (NEURONTIN) 250 MG/5ML solution Take 12 mLs (600 mg total) by mouth 3 (three) times daily. 06/12/19   Dana Allan, MD  mirtazapine (REMERON) 15 MG tablet Take 1 tablet (15 mg total) by mouth at bedtime. 06/12/19 06/11/20  Dana Allan, MD  nicotine (NICODERM CQ - DOSED IN MG/24 HOURS) 21 mg/24hr patch Place 1 patch (21 mg total) onto the skin daily. 06/13/19   Dana Allan, MD  pantoprazole sodium (PROTONIX) 40 mg/20 mL  PACK Take 20 mLs (40 mg total) by mouth daily. 06/13/19   Dana Allan, MD  polyethylene glycol (MIRALAX / GLYCOLAX) 17 g packet Take 17 g by mouth daily. 06/13/19   Dana Allan, MD     Family History  Problem Relation Age of Onset  . Hypertension Mother   . Hypertension Father     Social History   Socioeconomic History  . Marital status: Divorced    Spouse name: Not on file  . Number of children: Not on file  . Years of education: Not on file  . Highest education level: Not on file  Occupational History  . Not on file  Tobacco Use  . Smoking status: Current Every Day Smoker    Packs/day: 4.00    Types: Cigarettes  Substance and Sexual Activity  . Alcohol use: Not Currently  . Drug use: Never  . Sexual activity: Not Currently  Other Topics Concern  . Not on file  Social History Narrative   a   Social Determinants of Health   Financial Resource Strain:   . Difficulty of Paying Living Expenses:   Food Insecurity:   . Worried About Programme researcher, broadcasting/film/video in the Last Year:   . Barista in the Last Year:   Transportation Needs:   . Freight forwarder (Medical):   Marland Kitchen Lack of Transportation (Non-Medical):   Physical Activity:   . Days of Exercise per Week:   . Minutes of Exercise per Session:   Stress:   . Feeling of Stress :  Social Connections:   . Frequency of Communication with Friends and Family:   . Frequency of Social Gatherings with Friends and Family:   . Attends Religious Services:   . Active Member of Clubs or Organizations:   . Attends Banker Meetings:   Marland Kitchen Marital Status:      Review of Systems: A 12 point ROS discussed and pertinent positives are indicated in the HPI above.  All other systems are negative.  Review of Systems  Constitutional: Positive for fatigue. Negative for fever.  Respiratory: Negative for cough and shortness of breath.   Cardiovascular: Negative for chest pain.  Gastrointestinal: Negative for abdominal  pain.  Musculoskeletal: Positive for gait problem.  Neurological: Positive for speech difficulty and weakness. Negative for dizziness, tremors, seizures, syncope, facial asymmetry, light-headedness, numbness and headaches.  Psychiatric/Behavioral: Positive for confusion and decreased concentration.    Vital Signs: BP 114/84 (BP Location: Left Arm)   Pulse 87   Temp 98.1 F (36.7 C)   Resp 18   Ht 5\' 11"  (1.803 m)   Wt 146 lb 3.2 oz (66.3 kg)   SpO2 92%   BMI 20.39 kg/m   Physical Exam Vitals reviewed.  Constitutional:      Comments: Tries to engage  HENT:     Mouth/Throat:     Mouth: Mucous membranes are moist.  Cardiovascular:     Rate and Rhythm: Normal rate and regular rhythm.     Heart sounds: Normal heart sounds.  Pulmonary:     Effort: Pulmonary effort is normal.     Breath sounds: Normal breath sounds.  Abdominal:     Palpations: Abdomen is soft.  Musculoskeletal:     Comments: Can lift right hand/arm No control-- cannot follow commands Cannot use legs at all-- cannot move them  Skin:    General: Skin is warm and dry.  Neurological:     Mental Status: He is alert.     Comments: No verbal response  Psychiatric:     Comments: Spoke to Wife [name] via phone She consents for procedure     Imaging: CT ABDOMEN WO CONTRAST  Result Date: 06/16/2019 CLINICAL DATA:  Severe dysplasia. EXAM: CT ABDOMEN WITHOUT CONTRAST TECHNIQUE: Multidetector CT imaging of the abdomen was performed following the standard protocol without IV contrast. COMPARISON:  None. FINDINGS: Lower chest: Mild to moderate severity atelectasis and/or infiltrate is seen within the posterior aspect of the right lung base. Hepatobiliary: No focal liver abnormality is seen. Status post cholecystectomy. No biliary dilatation. Pancreas: Unremarkable. No pancreatic ductal dilatation or surrounding inflammatory changes. Spleen: Normal in size without focal abnormality. Adrenals/Urinary Tract: Adrenal glands  are unremarkable. Kidneys are normal in size, without renal calculi or hydronephrosis. A 1.7 cm cyst is seen within the mid to lower right kidney. Stomach/Bowel: Stomach is within normal limits. The appendix is not identified. No evidence of bowel wall thickening, distention, or inflammatory changes. Vascular/Lymphatic: There is moderate severity calcification of the abdominal aorta. No enlarged abdominal lymph nodes. Other: No abdominal wall hernia or abnormality. Musculoskeletal: A few small foci of air density are seen within the subcutaneous fat along the anterolateral aspect of the lower abdominal wall, bilaterally. No acute or significant osseous findings. IMPRESSION: 1. Mild to moderate severity atelectasis and/or infiltrate within the posterior aspect of the right lung base. 2. Evidence of prior cholecystectomy. 3. Small right renal cyst. Aortic Atherosclerosis (ICD10-I70.0). Electronically Signed   By: Aram Candela M.D.   On: 06/16/2019 21:04   CT  ANGIO HEAD W OR WO CONTRAST  Result Date: 06/09/2019 CLINICAL DATA:  Follow-up examination for stroke. EXAM: CT ANGIOGRAPHY HEAD AND NECK TECHNIQUE: Multidetector CT imaging of the head and neck was performed using the standard protocol during bolus administration of intravenous contrast. Multiplanar CT image reconstructions and MIPs were obtained to evaluate the vascular anatomy. Carotid stenosis measurements (when applicable) are obtained utilizing NASCET criteria, using the distal internal carotid diameter as the denominator. CONTRAST:  80mL OMNIPAQUE IOHEXOL 350 MG/ML SOLN COMPARISON:  Prior CTA from 06/02/2019. FINDINGS: CT HEAD FINDINGS Brain: Normal expected interval evolution of multifocal posterior circulation infarcts, relatively stable in size and morphology as compared to previous exams. No evidence for hemorrhagic transformation or other complication. No significant mass effect. Additional chronic infarcts involving the right basal ganglia,  right parietal lobe, and right cerebellum noted. No acute intracranial hemorrhage. No new large vessel territory infarct. No mass lesion or midline shift. No hydrocephalus or extra-axial fluid collection. Vascular: Hyperdensity within the proximal basilar, consistent with previously identified thrombus. Skull: Unremarkable. Sinuses: Paranasal sinuses and mastoid air cells remain largely clear. Orbits: Globes orbital soft tissues demonstrate no acute finding. Review of the MIP images confirms the above findings CTA NECK FINDINGS Aortic arch: Aortic arch not included on this exam. 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 Distal reconstitution at the origin of the right subclavian and right common carotid artery. Subclavian arteries otherwise patent with no new finding. Right carotid system: Right common and internal carotid arteries remain patent to the skull base without significant stenosis, dissection, or occlusion. Mild atheromatous irregularity about the right bifurcation without significant stenosis. Left carotid system: Left CCA widely patent proximally. Eccentric soft plaque within the mid left common carotid artery with relatively mild stenosis noted, unchanged. Left common and internal carotid arteries otherwise patent without significant stenosis, dissection, or occlusion. Vertebral arteries: Both vertebral arteries arise from the subclavian arteries. Soft plaque at the origin of the right vertebral artery with associated mild-to-moderate stenosis. Attenuated flow seen within the distal right V2 and V3 segments, which otherwise remain patent to the skull base. Minimal plaque at the origin of the left vertebral artery with no more than mild stenosis. Slightly dominant left vertebral artery otherwise widely patent within the neck. Skeleton: Mild multilevel cervical spondylosis without significant stenosis. Other neck: No other new soft tissue abnormality within the neck. Subcentimeter  hypodense right thyroid nodule noted, of doubtful significance given size and patient age. No follow-up imaging recommended regarding this lesion. Upper chest: Mild dependent atelectatic changes seen within the visualized lungs. Small amount of layering secretions noted within the subglottic airway. Visualized upper chest demonstrates no other acute abnormality. Review of the MIP images confirms the above findings CTA HEAD FINDINGS Anterior circulation: Internal carotid arteries remain widely patent to the termini without stenosis. A1 segments, anterior communicating artery common anterior cerebral arteries widely patent. No M1 stenosis. Negative MCA bifurcations. Distal MCA branches remain well perfused and symmetric. Posterior circulation: Vertebral arteries patent as they course into the cranial vault. Occlusive thrombus again seen within the distal V4 segments bilaterally, right greater than left, little interval change from previous. Left PICA remains patent. Right PICA not seen. Occlusive clot extends into the proximal and mid basilar artery, little interval change from previous. Neither AICA is well seen. Distal reconstitution at the distal aspect of the basilar artery. Superior cerebral arteries well perfused bilaterally. Both PCAs primarily supplied via the basilar, although small bilateral posterior communicating arteries are seen, left slightly larger  than right. PCAs mildly irregular but remain widely patent and well perfused to their distal aspects. Venous sinuses: Grossly patent allowing for timing the contrast bolus. Anatomic variants: None significant. Review of the MIP images confirms the above findings IMPRESSION: CT HEAD IMPRESSION: 1. Normal expected interval evolution of scattered posterior circulation infarcts, grossly stable in size and distribution as compared to previous. No evidence for hemorrhagic transformation or other complication. 2. No other new acute intracranial abnormality. CTA  HEAD AND NECK IMPRESSION: 1. Stable CTA of the head and neck with persistent occlusive thrombus involving the distal V4 segments as well as the proximal-mid basilar artery, little interval changed in appearance as compared to previous. Distal reconstitution at the distal basilar artery, likely via the anterior circulation via small bilateral posterior communicating arteries. Both PCAs and SCAs remain well perfused proximally. Right PICA not well visualized as before. 2. 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 3. Mild to moderate stenoses about the origins of the vertebral arteries bilaterally, right greater than left. 4. Otherwise wide patency of the major arterial vasculature of the head and neck. No other new or progressive finding. Electronically Signed   By: Jeannine Boga M.D.   On: 06/09/2019 03:11   CT ANGIO HEAD W OR WO CONTRAST  Result Date: 06/02/2019 CLINICAL DATA:  Stroke suspected. EXAM: CT ANGIOGRAPHY HEAD AND NECK TECHNIQUE: Multidetector CT imaging of the head and neck was performed using the standard protocol during bolus administration of intravenous contrast. Multiplanar CT image reconstructions and MIPs were obtained to evaluate the vascular anatomy. Carotid stenosis measurements (when applicable) are obtained utilizing NASCET criteria, using the distal internal carotid diameter as the denominator. CONTRAST:  177mL OMNIPAQUE IOHEXOL 350 MG/ML SOLN COMPARISON:  Brain MRI 06/02/2019. FINDINGS: CTA NECK FINDINGS Aortic arch: Standard aortic branching. There is an eccentric curvilinear filling defect within the lumen of aortic arch measuring 2.0 x 4.7 cm (series 7, image 344). Findings likely reflect eccentric thrombus. Additionally, there is occlusive thrombus within the proximal to mid innominate artery. There is reconstitution of flow at the level of the distal innominate artery. No significant proximal subclavian stenosis. Right carotid system: CCA and ICA patent  within the neck without significant stenosis (50% or greater). Left carotid system: CCA and ICA patent within the neck without significant stenosis (50% or greater). Eccentric hypodensity within the mid left common carotid artery has an appearance most suggestive of soft plaque. Eccentric thrombus cannot be definitively excluded. Vertebral arteries: The vertebral artery is slightly dominant. The vertebral arteries are patent within the neck. Soft plaque at the vertebral artery origins bilaterally with mild ostial stenosis on the left Skeleton: No acute bony abnormality or aggressive osseous lesion. Other neck: No neck mass or cervical lymphadenopathy. Chronic fracture deformity of the T1 spinous process Upper chest: No consolidation within the imaged lung apices. Review of the MIP images confirms the above findings CTA HEAD FINDINGS Anterior circulation: The intracranial internal carotid arteries are patent without significant stenosis. The M1 middle cerebral arteries are patent without significant stenosis. No M2 proximal branch occlusion or high-grade proximal stenosis is identified. The anterior cerebral arteries are patent without significant proximal stenosis. No intracranial aneurysm is identified. Posterior circulation: There is occlusive thrombus within the distal V4 right vertebral artery. Occlusive thrombus is also suspected within the distal V4 left vertebral artery. Occlusive thrombus extends into the proximal and mid basilar artery. There is reconstitution of flow within the distal basilar artery. Intermittent and irregular opacification of the right  PICA is seen and this vessel likely a contains multifocal thrombus. Flow is seen within the proximal left PICA. The AICA vessels are not well delineated. Flow is seen within the superior cerebellar arteries bilaterally. The posterior cerebral arteries are patent proximally without significant stenosis. However, emboli are suspected within the distal  posterior cerebral arteries bilaterally given the pattern of acute infarcts demonstrated on brain MRI performed earlier the same day. Posterior communicating arteries are not definitively identified. Venous sinuses: Within limitations of contrast timing, no convincing thrombus. Anatomic variants: As described. Review of the MIP images confirms the above findings These results were called by telephone at the time of interpretation on 06/02/2019 at 7:11 pm to provider Doralee Albino , who verbally acknowledged these results. IMPRESSION: CTA neck: 1. Eccentric endoluminal thrombus within the aortic arch as described. 2. 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 3. The bilateral common and internal carotid arteries are patent within the neck without significant stenosis. Eccentric hypodensity within the mid left common carotid artery has an appearance most suggestive of soft plaque. Eccentric thrombus at this site cannot be definitively excluded. 4. The vertebral arteries are patent within the neck bilaterally. Soft plaque at both vertebral artery origins with mild ostial stenosis on the left. CTA head: 1. Occlusive thrombus within the distal V4 right vertebral artery with occlusive thrombus extending into the proximal and mid basilar artery. Occlusive thrombus is also suspected within the very distal V4 left vertebral artery. There is reconstitution of flow within the distal basilar artery. 2. Only intermittent and irregular enhancement of the right PICA is seen and there is suspected multifocal thrombus within this vessel. 3. The bilateral AICAs are poorly delineated. 4. The posterior cerebral arteries are patent proximally without significant stenosis. However, emboli are suspected within the distal PCAs bilaterally given the pattern of acute infarcts demonstrated on same-day brain MRI. 5. No anterior circulation large vessel occlusion or proximal high-grade stenosis is identified.  Electronically Signed   By: Jackey Loge DO   On: 06/02/2019 19:30   DG Chest 2 View  Result Date: 06/11/2019 CLINICAL DATA:  Congestion with coughing. EXAM: CHEST - 2 VIEW COMPARISON:  06/08/2019 FINDINGS: Low lung volumes. Cardiopericardial silhouette is at upper limits of normal for size. There is pulmonary vascular congestion without overt pulmonary edema. Interstitial markings are diffusely coarsened with chronic features. Streaky atelectasis or scarring at the left base is similar to prior. No dense airspace consolidation. No pleural effusion. The visualized bony structures of the thorax are intact. Telemetry leads overlie the chest. IMPRESSION: Low lung volumes with vascular congestion. No overt pulmonary edema or focal airspace consolidation. Streaky opacity at the left base is similar to prior and likely reflects atelectasis or scarring. Electronically Signed   By: Kennith Center M.D.   On: 06/11/2019 10:11   DG Lumbar Spine 2-3 Views  Result Date: 06/13/2019 CLINICAL DATA:  Low back pain EXAM: LUMBAR SPINE - 2-3 VIEW COMPARISON:  None. FINDINGS: Colonic contrast is seen.  Colonic diverticuli. Evaluation of the lumbar spine on the lateral view is obscured by overlapping contrast filled colon. Within this limitation, no traumatic malalignment or fracture seen. Mild multilevel degenerative disc disease is noted. IMPRESSION: 1. Contrast in the colon limits evaluation lumbar spine on the lateral view. No fracture or traumatic malalignment seen within these limitations. Mild degenerative changes. Electronically Signed   By: Gerome Sam III M.D   On: 06/13/2019 14:15   CT HEAD WO CONTRAST  Result Date: 06/04/2019  CLINICAL DATA:  Subacute neurological change. EXAM: CT HEAD WITHOUT CONTRAST TECHNIQUE: Contiguous axial images were obtained from the base of the skull through the vertex without intravenous contrast. COMPARISON:  06/04/2019 head CT and MRI head. FINDINGS: Brain: Similar appearance of  multifocal bilateral cerebral and right cerebellar infarcts also involving the left basal ganglia, left thalamus and pons. No new hypodense foci. No intracranial hemorrhage. No mass lesion. No midline shift, ventriculomegaly or extra-axial fluid collection. Vascular: No hyperdense vessel or unexpected calcification. Skull: Negative for fracture or focal lesion. Sinuses/Orbits: Normal orbits. Clear paranasal sinuses. No mastoid effusion. Other: None. IMPRESSION: Multifocal subacute infarcts involving the bilateral cerebrum, right cerebellum, left basal ganglia/thalamus and pons are unchanged. No intracranial hemorrhage. Electronically Signed   By: Stana Bunting M.D.   On: 06/04/2019 15:03   CT HEAD WO CONTRAST  Result Date: 06/04/2019 CLINICAL DATA:  Stroke follow-up EXAM: CT HEAD WITHOUT CONTRAST TECHNIQUE: Contiguous axial images were obtained from the base of the skull through the vertex without intravenous contrast. COMPARISON:  Brain MRI 06/02/2019 FINDINGS: Brain: Unchanged appearance of multifocal subacute infarcts scattered throughout the brain, involving both hemispheres, multiple vascular territories and the cerebellum. No hemorrhage or mass effect. Vascular: No hyperdense vessel or unexpected calcification. Skull: Normal. Negative for fracture or focal lesion. Sinuses/Orbits: No acute finding. Other: None. IMPRESSION: 1. Unchanged appearance of multifocal subacute infarcts scattered throughout the brain, involving both hemispheres and the cerebellum. 2. No hemorrhage or mass effect. Electronically Signed   By: Deatra Robinson M.D.   On: 06/04/2019 00:09   CT HEAD WO CONTRAST  Result Date: 06/02/2019 CLINICAL DATA:  Right arm weakness, slurred speech, unequal pupils EXAM: CT HEAD WITHOUT CONTRAST TECHNIQUE: Contiguous axial images were obtained from the base of the skull through the vertex without intravenous contrast. COMPARISON:  None. FINDINGS: Brain: Multifocal somewhat rounded areas of  hypoattenuation scattered throughout the brain including within the high right parietal lobe (series 3, image 6), left periventricular white matter (series 3, image 13), bilateral occipital lobes (series 3, images 20 and 22), and right cerebellum (series 3, image 27). There may be mild vasogenic edema associated with the right parietal abnormality. No intracranial hemorrhage or extra-axial collection is seen. No hydrocephalus. Focal encephalomalacia adjacent to the frontal horn of the right lateral ventricle may represent prior lacunar infarct with associated ex vacuo dilatation. Vascular: No hyperdense vessel or unexpected calcification. Skull: Normal. Negative for fracture or focal lesion. Sinuses/Orbits: Small mucous retention cyst versus sinonasal polyp within the inferior right maxillary sinus. Remaining paranasal sinuses are clear. Orbital structures appear intact and unremarkable. Other: None. IMPRESSION: Multifocal areas of hypoattenuation scattered throughout the bilateral supratentorial brain and right cerebellum. There may be mild associated vasogenic edema with a high right parietal lesion. Findings are suspicious for multifocal brain metastases. Further evaluation with contrast-enhanced MRI of the brain is recommended. These results were called by telephone at the time of interpretation on 06/02/2019 at 11:40 am to provider [employer] , who verbally acknowledged these results. Electronically Signed   By: Duanne Guess D.O.   On: 06/02/2019 11:42   CT ANGIO NECK W OR WO CONTRAST  Result Date: 06/09/2019 CLINICAL DATA:  Follow-up examination for stroke. EXAM: CT ANGIOGRAPHY HEAD AND NECK TECHNIQUE: Multidetector CT imaging of the head and neck was performed using the standard protocol during bolus administration of intravenous contrast. Multiplanar CT image reconstructions and MIPs were obtained to evaluate the vascular anatomy. Carotid stenosis measurements (when applicable) are obtained  utilizing NASCET criteria,  using the distal internal carotid diameter as the denominator. CONTRAST:  53mL OMNIPAQUE IOHEXOL 350 MG/ML SOLN COMPARISON:  Prior CTA from 06/02/2019. FINDINGS: CT HEAD FINDINGS Brain: Normal expected interval evolution of multifocal posterior circulation infarcts, relatively stable in size and morphology as compared to previous exams. No evidence for hemorrhagic transformation or other complication. No significant mass effect. Additional chronic infarcts involving the right basal ganglia, right parietal lobe, and right cerebellum noted. No acute intracranial hemorrhage. No new large vessel territory infarct. No mass lesion or midline shift. No hydrocephalus or extra-axial fluid collection. Vascular: Hyperdensity within the proximal basilar, consistent with previously identified thrombus. Skull: Unremarkable. Sinuses: Paranasal sinuses and mastoid air cells remain largely clear. Orbits: Globes orbital soft tissues demonstrate no acute finding. Review of the MIP images confirms the above findings CTA NECK FINDINGS Aortic arch: Aortic arch not included on this exam. 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 Distal reconstitution at the origin of the right subclavian and right common carotid artery. Subclavian arteries otherwise patent with no new finding. Right carotid system: Right common and internal carotid arteries remain patent to the skull base without significant stenosis, dissection, or occlusion. Mild atheromatous irregularity about the right bifurcation without significant stenosis. Left carotid system: Left CCA widely patent proximally. Eccentric soft plaque within the mid left common carotid artery with relatively mild stenosis noted, unchanged. Left common and internal carotid arteries otherwise patent without significant stenosis, dissection, or occlusion. Vertebral arteries: Both vertebral arteries arise from the subclavian arteries. Soft plaque at the origin  of the right vertebral artery with associated mild-to-moderate stenosis. Attenuated flow seen within the distal right V2 and V3 segments, which otherwise remain patent to the skull base. Minimal plaque at the origin of the left vertebral artery with no more than mild stenosis. Slightly dominant left vertebral artery otherwise widely patent within the neck. Skeleton: Mild multilevel cervical spondylosis without significant stenosis. Other neck: No other new soft tissue abnormality within the neck. Subcentimeter hypodense right thyroid nodule noted, of doubtful significance given size and patient age. No follow-up imaging recommended regarding this lesion. Upper chest: Mild dependent atelectatic changes seen within the visualized lungs. Small amount of layering secretions noted within the subglottic airway. Visualized upper chest demonstrates no other acute abnormality. Review of the MIP images confirms the above findings CTA HEAD FINDINGS Anterior circulation: Internal carotid arteries remain widely patent to the termini without stenosis. A1 segments, anterior communicating artery common anterior cerebral arteries widely patent. No M1 stenosis. Negative MCA bifurcations. Distal MCA branches remain well perfused and symmetric. Posterior circulation: Vertebral arteries patent as they course into the cranial vault. Occlusive thrombus again seen within the distal V4 segments bilaterally, right greater than left, little interval change from previous. Left PICA remains patent. Right PICA not seen. Occlusive clot extends into the proximal and mid basilar artery, little interval change from previous. Neither AICA is well seen. Distal reconstitution at the distal aspect of the basilar artery. Superior cerebral arteries well perfused bilaterally. Both PCAs primarily supplied via the basilar, although small bilateral posterior communicating arteries are seen, left slightly larger than right. PCAs mildly irregular but remain  widely patent and well perfused to their distal aspects. Venous sinuses: Grossly patent allowing for timing the contrast bolus. Anatomic variants: None significant. Review of the MIP images confirms the above findings IMPRESSION: CT HEAD IMPRESSION: 1. Normal expected interval evolution of scattered posterior circulation infarcts, grossly stable in size and distribution as compared to previous. No evidence for hemorrhagic  transformation or other complication. 2. No other new acute intracranial abnormality. CTA HEAD AND NECK IMPRESSION: 1. Stable CTA of the head and neck with persistent occlusive thrombus involving the distal V4 segments as well as the proximal-mid basilar artery, little interval changed in appearance as compared to previous. Distal reconstitution at the distal basilar artery, likely via the anterior circulation via small bilateral posterior communicating arteries. Both PCAs and SCAs remain well perfused proximally. Right PICA not well visualized as before. 2. 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 3. Mild to moderate stenoses about the origins of the vertebral arteries bilaterally, right greater than left. 4. Otherwise wide patency of the major arterial vasculature of the head and neck. No other new or progressive finding. Electronically Signed   By: Rise Mu M.D.   On: 06/09/2019 03:11   CT ANGIO NECK W OR WO CONTRAST  Result Date: 06/02/2019 CLINICAL DATA:  Stroke suspected. EXAM: CT ANGIOGRAPHY HEAD AND NECK TECHNIQUE: Multidetector CT imaging of the head and neck was performed using the standard protocol during bolus administration of intravenous contrast. Multiplanar CT image reconstructions and MIPs were obtained to evaluate the vascular anatomy. Carotid stenosis measurements (when applicable) are obtained utilizing NASCET criteria, using the distal internal carotid diameter as the denominator. CONTRAST:  OMNIPAQUE IOHEXOL 350 MG/ML SOLN COMPARISON:   Brain MRI 06/02/2019. FINDINGS: CTA NECK FINDINGS Aortic arch: Standard aortic branching. There is an eccentric curvilinear filling defect within the lumen of aortic arch measuring 2.0 x 4.7 cm (series 7, image 344). Findings likely reflect eccentric thrombus. Additionally, there is occlusive thrombus within the proximal to mid innominate artery. There is reconstitution of flow at the level of the distal innominate artery. No significant proximal subclavian stenosis. Right carotid system: CCA and ICA patent within the neck without significant stenosis (50% or greater). Left carotid system: CCA and ICA patent within the neck without significant stenosis (50% or greater). Eccentric hypodensity within the mid left common carotid artery has an appearance most suggestive of soft plaque. Eccentric thrombus cannot be definitively excluded. Vertebral arteries: The vertebral artery is slightly dominant. The vertebral arteries are patent within the neck. Soft plaque at the vertebral artery origins bilaterally with mild ostial stenosis on the left Skeleton: No acute bony abnormality or aggressive osseous lesion. Other neck: No neck mass or cervical lymphadenopathy. Chronic fracture deformity of the T1 spinous process Upper chest: No consolidation within the imaged lung apices. Review of the MIP images confirms the above findings CTA HEAD FINDINGS Anterior circulation: The intracranial internal carotid arteries are patent without significant stenosis. The M1 middle cerebral arteries are patent without significant stenosis. No M2 proximal branch occlusion or high-grade proximal stenosis is identified. The anterior cerebral arteries are patent without significant proximal stenosis. No intracranial aneurysm is identified. Posterior circulation: There is occlusive thrombus within the distal V4 right vertebral artery. Occlusive thrombus is also suspected within the distal V4 left vertebral artery. Occlusive thrombus extends into the  proximal and mid basilar artery. There is reconstitution of flow within the distal basilar artery. Intermittent and irregular opacification of the right PICA is seen and this vessel likely a contains multifocal thrombus. Flow is seen within the proximal left PICA. The AICA vessels are not well delineated. Flow is seen within the superior cerebellar arteries bilaterally. The posterior cerebral arteries are patent proximally without significant stenosis. However, emboli are suspected within the distal posterior cerebral arteries bilaterally given the pattern of acute infarcts demonstrated on brain MRI performed  earlier the same day. Posterior communicating arteries are not definitively identified. Venous sinuses: Within limitations of contrast timing, no convincing thrombus. Anatomic variants: As described. Review of the MIP images confirms the above findings These results were called by telephone at the time of interpretation on 06/02/2019 at 7:11 pm to provider Doralee Albino , who verbally acknowledged these results. IMPRESSION: CTA neck: 1. Eccentric endoluminal thrombus within the aortic arch as described. 2. 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 3. The bilateral common and internal carotid arteries are patent within the neck without significant stenosis. Eccentric hypodensity within the mid left common carotid artery has an appearance most suggestive of soft plaque. Eccentric thrombus at this site cannot be definitively excluded. 4. The vertebral arteries are patent within the neck bilaterally. Soft plaque at both vertebral artery origins with mild ostial stenosis on the left. CTA head: 1. Occlusive thrombus within the distal V4 right vertebral artery with occlusive thrombus extending into the proximal and mid basilar artery. Occlusive thrombus is also suspected within the very distal V4 left vertebral artery. There is reconstitution of flow within the distal basilar artery. 2. Only  intermittent and irregular enhancement of the right PICA is seen and there is suspected multifocal thrombus within this vessel. 3. The bilateral AICAs are poorly delineated. 4. The posterior cerebral arteries are patent proximally without significant stenosis. However, emboli are suspected within the distal PCAs bilaterally given the pattern of acute infarcts demonstrated on same-day brain MRI. 5. No anterior circulation large vessel occlusion or proximal high-grade stenosis is identified. Electronically Signed   By: Jackey Loge DO   On: 06/02/2019 19:30   MR BRAIN WO CONTRAST  Result Date: 06/04/2019 CLINICAL DATA:  Stroke follow-up EXAM: MRI HEAD WITHOUT CONTRAST TECHNIQUE: Multiplanar, multiecho pulse sequences of the brain and surrounding structures were obtained without intravenous contrast. COMPARISON:  Head CT 06/03/2019 Brain MRI 06/02/2019 FINDINGS: Brain: Numerous right cerebellar embolic infarcts are unchanged. Infarcts of the bilateral pons have slightly increased in size. Multifocal acute ischemia within both occipital lobes, the left thalamus, the left caudate nucleus and the right frontal white matter are unchanged. Multifocal white matter hyperintensity, most commonly due to chronic ischemic microangiopathy. Normal volume of CSF spaces. No chronic microhemorrhage. Normal midline structures. Vascular: Normal flow voids. Skull and upper cervical spine: Normal marrow signal. Sinuses/Orbits: Negative. Other: None. IMPRESSION: 1. Slight increase in size of bilateral acute infarcts of the pons. Unchanged infarcts of the left thalamus, left caudate nucleus, right cerebellum and right frontal white matter. 2. No hemorrhage or mass effect. 3. Findings of chronic ischemic microangiopathy. Electronically Signed   By: Deatra Robinson M.D.   On: 06/04/2019 00:55   MR BRAIN WO CONTRAST  Result Date: 06/02/2019 CLINICAL DATA:  Speech difficulty. EXAM: MRI HEAD WITHOUT CONTRAST TECHNIQUE: Multiplanar,  multiecho pulse sequences of the brain and surrounding structures were obtained without intravenous contrast. COMPARISON:  Noncontrast head CT 06/02/2019 FINDINGS: Brain: Please note the patient was unable to tolerate the full examination. As a result, postcontrast imaging could not be obtained. The acquired sequences are intermittently motion degraded. There are multiple small scattered foci of cortical and subcortical restricted diffusion within the bilateral cerebral hemispheres consistent with acute infarcts. These are most notably within the bilateral parietooccipital lobes, but also affect the bilateral frontal lobes. Additional small acute infarct within the left corona radiata. Multiple acute lacunar infarcts within the left thalamus. Multiple acute infarcts within the pons. Multiple acute infarcts within the right cerebellum. Corresponding  T2/FLAIR hyperintensity at these sites. No significant mass effect. Chronic infarct within the right frontal lobe motor strip and high right parietal lobe. Background mild scattered T2/FLAIR hyperintensity within the cerebral white matter is nonspecific, but consistent with chronic small vessel ischemic disease. Mild generalized parenchymal atrophy. There is no evidence of intracranial mass. No midline shift or extra-axial fluid collection. No chronic intracranial blood products. Vascular: No definite loss of expected proximal arterial flow voids. Skull and upper cervical spine: No focal marrow lesion. Sinuses/Orbits: Visualized orbits demonstrate no acute abnormality. Mild ethmoid and right maxillary sinus mucosal thickening. No significant mastoid effusion. IMPRESSION: The patient was unable to tolerate the full examination. As a result, postcontrast imaging could not be obtained. Numerous small supratentorial and infratentorial acute infarcts involving the cerebral hemispheres, left thalamus, pons and right cerebellar hemisphere. Given involvement of multiple vascular  territories, findings are suggestive of an embolic process. Chronic right frontoparietal cortical infarcts. Background mild generalized parenchymal atrophy and chronic small vessel ischemic disease. Electronically Signed   By: Jackey Loge DO   On: 06/02/2019 15:24   MR CERVICAL SPINE WO CONTRAST  Result Date: 06/02/2019 CLINICAL DATA:  Spinal stenosis EXAM: MRI CERVICAL SPINE WITHOUT CONTRAST TECHNIQUE: Multiplanar, multisequence MR imaging of the cervical spine was performed. No intravenous contrast was administered. COMPARISON:  None. FINDINGS: Significant motion artifact is present Alignment: Trace degenerative listhesis. Vertebrae: Vertebral body heights appear preserved apart from mild degenerative endplate irregularity. No definite marrow edema. Cord: No definite abnormal signal. Posterior Fossa, vertebral arteries, paraspinal tissues: Small right cerebellar infarct. Refer to MRI brain earlier same day Disc levels: C2-C3: Small central disc protrusion indents the ventral thecal sac. Facet hypertrophy no significant canal or foraminal stenosis C3-C4: Facet hypertrophy. No significant canal or foraminal stenosis. C4-C5: Probable left paracentral disc osteophyte complex. No canal stenosis. Probable left foraminal stenosis. C5-C6: Disc bulge with endplate osteophytic ridging eccentric to the left. Probable mild canal stenosis. Probable bilateral foraminal stenosis. C6-C7: Disc bulge with endplate osteophytic ridging eccentric to the left. No significant canal or foraminal stenosis. C7-T1:  No significant canal or foraminal stenosis. IMPRESSION: Motion degraded study. There is no high-grade canal stenosis. Probable foraminal stenosis, which is difficult to evaluate in the setting of artifact. Electronically Signed   By: Guadlupe Spanish M.D.   On: 06/02/2019 18:15   DG CHEST PORT 1 VIEW  Result Date: 06/08/2019 CLINICAL DATA:  54 year old male with leukocytosis. EXAM: PORTABLE CHEST 1 VIEW COMPARISON:  Chest  radiograph dated 06/05/2019. FINDINGS: There is mild chronic interstitial coarsening and bronchitic changes. Bibasilar streaky densities likely atelectasis/scarring. No focal consolidation, pleural effusion or pneumothorax. The cardiac silhouette is within normal limits. No acute osseous pathology. IMPRESSION: No focal consolidation. Electronically Signed   By: Elgie Collard M.D.   On: 06/08/2019 15:55   DG CHEST PORT 1 VIEW  Result Date: 06/05/2019 CLINICAL DATA:  Leukocytosis. EXAM: PORTABLE CHEST 1 VIEW COMPARISON:  June 02, 2019. FINDINGS: The heart size and mediastinal contours are within normal limits. No pneumothorax or pleural effusion is noted. Mild bibasilar subsegmental atelectasis is noted. The visualized skeletal structures are unremarkable. IMPRESSION: Mild bibasilar subsegmental atelectasis. Electronically Signed   By: Lupita Raider M.D.   On: 06/05/2019 12:37   DG Chest Port 1 View  Result Date: 06/02/2019 CLINICAL DATA:  Recent stroke-like symptoms EXAM: PORTABLE CHEST 1 VIEW COMPARISON:  04/07/2016 FINDINGS: Cardiac shadow is mildly enlarged but accentuated by the portable technique. Gastric catheter is noted within the stomach but could  be advanced several cm deeper to allow for the proximal side port to lie within the gastric lumen. Lungs are well aerated bilaterally. Mild bibasilar atelectasis is seen. No acute bony abnormality is noted. IMPRESSION: Gastric catheter as described. Mild bibasilar atelectasis. Electronically Signed   By: Alcide Clever M.D.   On: 06/02/2019 21:34   DG Swallowing Func-Speech Pathology  Result Date: 06/04/2019 Objective Swallowing Evaluation: Type of Study: MBS-Modified Barium Swallow Study  Patient Details Name: ZADIN LANGE MRN: [id number] Date of Birth: 28-Feb-1965 Today's Date: 06/04/2019 Time: SLP Start Time (ACUTE ONLY): 1010 -SLP Stop Time (ACUTE ONLY): 1040 SLP Time Calculation (min) (ACUTE ONLY): 30 min Past Medical History: Past Medical History:  Diagnosis Date . Tobacco abuse  Past Surgical History: No past surgical history on file. HPI: This is a 54 year old gentleman with sudden onset of dizziness followed by dysarthria and right-sided weakness.  MRI confirms both supratentorial and infratentorial bilateral scattered infarcts.  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 MRI brain-numerous small supratentorial and infratentorial acute infarcts involving the cerebral hemispheres, left thalamus, pons and right cerebellar hemisphere.  No data recorded Assessment / Plan / Recommendation CHL IP CLINICAL IMPRESSIONS 06/04/2019 Clinical Impression Pt presents with a primary moderate to severe oral dysphagia due to poor lingual fine motor coordination. Primary problem with the ability to both acheive posterior oral seal to prevent spillage to pharynx while also propelling bolus with appropriate propulsion as well as the inability to use to the tongue to manipulate a solid bolus for mastication. USe of nectar thick liquids improves time and cohesion of the bolus to reduce premature spillage and avoid delayed swallow intiation. With thins however there is posterior spillage of the bolus during propulsion efforts with sensed aspiration as laryngeal elevation initiates. Pt showed promise with a chin tuck maneuver (head turns ineffective), but due to intermittent crying behavior (PBA) pt could not sustain attention for thorough trials. Will attempt a chin tuck with thin at the bedside. For now, pt to initaite nectar thick liquids and pureed solids, preferably with consistent use of a straw. SLP Visit Diagnosis Dysphagia, oral phase (R13.11);Dysphagia, oropharyngeal phase (R13.12) Attention and concentration deficit following -- Frontal lobe and executive function deficit following -- Impact on safety and function Moderate aspiration risk   CHL IP TREATMENT RECOMMENDATION  06/04/2019 Treatment Recommendations Therapy as outlined in treatment plan below   No flowsheet data found. CHL IP DIET RECOMMENDATION 06/04/2019 SLP Diet Recommendations Dysphagia 1 (Puree) solids;Nectar thick liquid Liquid Administration via Straw Medication Administration Crushed with puree Compensations -- Postural Changes Remain semi-upright after after feeds/meals (Comment);Seated upright at 90 degrees   CHL IP OTHER RECOMMENDATIONS 06/04/2019 Recommended Consults -- Oral Care Recommendations Oral care BID Other Recommendations --   CHL IP FOLLOW UP RECOMMENDATIONS 06/04/2019 Follow up Recommendations Inpatient Rehab   CHL IP FREQUENCY AND DURATION 06/04/2019 Speech Therapy Frequency (ACUTE ONLY) min 2x/week Treatment Duration 2 weeks      CHL IP ORAL PHASE 06/04/2019 Oral Phase Impaired Oral - Pudding Teaspoon -- Oral - Pudding Cup -- Oral - Honey Teaspoon -- Oral - Honey Cup -- Oral - Nectar Teaspoon Decreased bolus cohesion Oral - Nectar Cup -- Oral - Nectar Straw WFL Oral - Thin Teaspoon -- Oral - Thin Cup -- Oral - Thin Straw Decreased bolus cohesion;Premature spillage;Delayed oral transit Oral - Puree Delayed oral transit;WFL Oral - Mech Soft -- Oral -  Regular Weak lingual manipulation;Impaired mastication Oral - Multi-Consistency -- Oral - Pill -- Oral Phase - Comment --  CHL IP PHARYNGEAL PHASE 06/04/2019 Pharyngeal Phase Impaired Pharyngeal- Pudding Teaspoon -- Pharyngeal -- Pharyngeal- Pudding Cup -- Pharyngeal -- Pharyngeal- Honey Teaspoon -- Pharyngeal -- Pharyngeal- Honey Cup -- Pharyngeal -- Pharyngeal- Nectar Teaspoon Lateral channel residue Pharyngeal -- Pharyngeal- Nectar Cup -- Pharyngeal -- Pharyngeal- Nectar Straw Lateral channel residue Pharyngeal -- Pharyngeal- Thin Teaspoon -- Pharyngeal -- Pharyngeal- Thin Cup Lateral channel residue;Penetration/Aspiration during swallow;Delayed swallow initiation-pyriform sinuses Pharyngeal Material enters airway, passes BELOW cords and not ejected out  despite cough attempt by patient Pharyngeal- Thin Straw Lateral channel residue;Penetration/Aspiration before swallow;Delayed swallow initiation-pyriform sinuses Pharyngeal Material enters airway, passes BELOW cords and not ejected out despite cough attempt by patient Pharyngeal- Puree Lateral channel residue Pharyngeal -- Pharyngeal- Mechanical Soft -- Pharyngeal -- Pharyngeal- Regular Lateral channel residue Pharyngeal -- Pharyngeal- Multi-consistency -- Pharyngeal -- Pharyngeal- Pill -- Pharyngeal -- Pharyngeal Comment --  No flowsheet data found. Harlon Ditty, MA CCC-SLP Acute Rehabilitation Services Pager [phone] Office [phone] Claudine Mouton 06/04/2019, 11:28 AM              EEG adult  Result Date: 06/03/2019 Charlsie Quest, MD     06/03/2019 11:56 AM Patient Name: DEMIAN MAISEL MRN: [id number] Epilepsy Attending: Charlsie Quest Referring Physician/Provider: Dr. Orpah Cobb Date: 06/02/2019 Duration: 30.13 minutes Patient history: 54 year old male present with sudden onset of dizziness and dysarthria as well as right-sided weakness.  EEG evaluate for seizures. Level of alertness: Awake AEDs during EEG study: None Technical aspects: This EEG study was done with scalp electrodes positioned according to the 10-20 International system of electrode placement. Electrical activity was acquired at a sampling rate of 500Hz  and reviewed with a high frequency filter of 70Hz  and a low frequency filter of 1Hz . EEG data were recorded continuously and digitally stored. Description: The posterior dominant rhythm consists of 8 Hz activity of moderate voltage (25-35 uV) seen predominantly in posterior head regions, symmetric and reactive to eye opening and eye closing. Hyperventilation and photic stimulation were not performed. IMPRESSION: This study is within normal limits. No seizures or epileptiform discharges were seen throughout the recording. Charlsie Quest   ECHOCARDIOGRAM  COMPLETE  Result Date: 06/03/2019    ECHOCARDIOGRAM REPORT   Patient Name:   JARRETT ALBOR Date of Exam: 06/03/2019 Medical Rec #:  [id number]     Height:       67.0 in Accession #:    [id number]    Weight:       156.7 lb Date of Birth:  1965/06/02      BSA:          1.823 m Patient Age:    53 years      BP:           151/97 mmHg Patient Gender: M             HR:           89 bpm. Exam Location:  Inpatient Procedure: 2D Echo Indications:    Stroke 434.91/ I163.9  History:        Patient has no prior history of Echocardiogram examinations. No                 prior cardiac.  Sonographer:    Leeroy Bock Turrentine Referring Phys: Cecilio Asper A HENSEL IMPRESSIONS  1. Left ventricular ejection fraction, by estimation, is 65 to 70%. The left ventricle  has normal function. The left ventricle has no regional wall motion abnormalities. There is mild concentric left ventricular hypertrophy. Left ventricular diastolic parameters are consistent with Grade I diastolic dysfunction (impaired relaxation).  2. Right ventricular systolic function is normal. The right ventricular size is normal.  3. The mitral valve is normal in structure. No evidence of mitral valve regurgitation. No evidence of mitral stenosis.  4. The aortic valve is normal in structure. Aortic valve regurgitation is not visualized. No aortic stenosis is present.  5. The inferior vena cava is normal in size with greater than 50% respiratory variability, suggesting right atrial pressure of 3 mmHg. Conclusion(s)/Recommendation(s): No intracardiac source of embolism detected on this transthoracic study. A transesophageal echocardiogram is recommended to exclude cardiac source of embolism if clinically indicated. FINDINGS  Left Ventricle: Left ventricular ejection fraction, by estimation, is 65 to 70%. The left ventricle has normal function. The left ventricle has no regional wall motion abnormalities. The left ventricular internal cavity size was normal in size. There is   mild concentric left ventricular hypertrophy. Left ventricular diastolic parameters are consistent with Grade I diastolic dysfunction (impaired relaxation). Normal left ventricular filling pressure. Right Ventricle: The right ventricular size is normal. No increase in right ventricular wall thickness. Right ventricular systolic function is normal. Left Atrium: Left atrial size was normal in size. Right Atrium: Right atrial size was normal in size. Pericardium: Trivial pericardial effusion is present. The pericardial effusion is posterior to the left ventricle. There is no evidence of cardiac tamponade. Mitral Valve: The mitral valve is normal in structure. Normal mobility of the mitral valve leaflets. No evidence of mitral valve regurgitation. No evidence of mitral valve stenosis. Tricuspid Valve: The tricuspid valve is normal in structure. Tricuspid valve regurgitation is trivial. No evidence of tricuspid stenosis. Aortic Valve: The aortic valve is normal in structure. Aortic valve regurgitation is not visualized. No aortic stenosis is present. Pulmonic Valve: The pulmonic valve was normal in structure. Pulmonic valve regurgitation is not visualized. No evidence of pulmonic stenosis. Aorta: The aortic root is normal in size and structure. Venous: The inferior vena cava is normal in size with greater than 50% respiratory variability, suggesting right atrial pressure of 3 mmHg. IAS/Shunts: No atrial level shunt detected by color flow Doppler.  LEFT VENTRICLE PLAX 2D LVIDd:         4.20 cm  Diastology LVIDs:         2.90 cm  LV e' lateral:   6.74 cm/s LV PW:         1.10 cm  LV E/e' lateral: 10.0 LV IVS:        1.10 cm  LV e' medial:    6.42 cm/s LVOT diam:     2.00 cm  LV E/e' medial:  10.5 LV SV:         65 LV SV Index:   36 LVOT Area:     3.14 cm  RIGHT VENTRICLE RV S prime:     16.20 cm/s TAPSE (M-mode): 3.0 cm LEFT ATRIUM             Index       RIGHT ATRIUM          Index LA diam:        2.55 cm 1.40 cm/m  RA  Area:     9.92 cm LA Vol (A2C):   15.8 ml 8.67 ml/m  RA Volume:   18.30 ml 10.04 ml/m LA Vol (A4C):   19.0 ml  10.42 ml/m LA Biplane Vol: 18.4 ml 10.09 ml/m  AORTIC VALVE LVOT Vmax:   129.00 cm/s LVOT Vmean:  84.100 cm/s LVOT VTI:    0.207 m  AORTA Ao Root diam: 2.90 cm Ao Asc diam:  3.00 cm MITRAL VALVE MV Area (PHT): 3.50 cm     SHUNTS MV Decel Time: 217 msec     Systemic VTI:  0.21 m MV E velocity: 67.30 cm/s   Systemic Diam: 2.00 cm MV A velocity: 101.00 cm/s MV E/A ratio:  0.67 Tobias Alexander MD Electronically signed by Tobias Alexander MD Signature Date/Time: 06/03/2019/8:22:00 PM    Final     Labs:  CBC: Recent Labs    06/09/19 0427 06/10/19 0546 06/12/19 0420 06/14/19 0652  WBC 20.1* 19.4* 15.5* 14.7*  HGB 14.5 14.3 14.4 13.6  HCT 43.4 42.9 43.6 42.6  PLT 233 239 284 302    COAGS: Recent Labs    06/02/19 1058  INR 0.9    BMP: Recent Labs    06/09/19 0427 06/09/19 0427 06/10/19 0546 06/12/19 0420 06/14/19 0652 06/19/19 0537  NA 140  --  139 143 142  --   K 3.5  --  3.7 3.9 4.0  --   CL 109  --  108 108 110  --   CO2 21*  --  19* 24 23  --   GLUCOSE 104*  --  93 108* 115*  --   BUN 20  --  17 23* 21*  --   CALCIUM 8.7*  --  8.8* 9.1 9.0  --   CREATININE 0.78   < > 0.76 0.80 0.78 0.80  GFRNONAA >60   < > >60 >60 >60 >60  GFRAA >60   < > >60 >60 >60 >60   < > = values in this interval not displayed.    LIVER FUNCTION TESTS: Recent Labs    06/02/19 1058 06/03/19 0657 06/14/19 0652  BILITOT 0.9 1.6* 0.6  AST 25 20 26   ALT 46* 39 37  ALKPHOS 92 80 72  PROT 7.1 6.3* 6.6  ALBUMIN 3.7 3.1* 2.5*    TUMOR MARKERS: No results for input(s): AFPTM, CEA, CA199, CHROMGRNA in the last 8760 hours.  Assessment and Plan:  CVA Quadriparesis Dysphagia; malnutrition Need for long term care Scheduled for percutaneous gastric tube placement (when G tubes are available in IR) Will come off Plavix for 5 days before placement Risks and benefits image guided  gastrostomy tube placement was discussed with the patient's wife [name] via phone including, but not limited to the need for a barium enema during the procedure, bleeding, infection, peritonitis and/or damage to adjacent structures.  All questions were answered, she is agreeable to proceed. Consent signed and in chart.   Thank you for this interesting consult.  I greatly enjoyed meeting PHILANDER AKE and look forward to participating in their care.  A copy of this report was sent to the requesting provider on this date.  Electronically Signed: Robet Leu, PA-C 06/21/2019, 8:56 AM   I spent a total of 40 Minutes    in face to face in clinical consultation, greater than 50% of which was counseling/coordinating care for percutaneous G tube placement

## 2019-06-21 NOTE — Progress Notes (Addendum)
[place] PHYSICAL MEDICINE & REHABILITATION PROGRESS NOTE   Subjective/Complaints:  Per SLP L upper toothache, pt in bed still sev3erely dysarthric but with assist can point to sore area Discussed skin and dental issues with wife and son . Pt is "afraid of dentist"  ROS: Limited due to dysarthria/expressive aphasia  Objective:   No results found. No results for input(s): WBC, HGB, HCT, PLT in the last 72 hours. Recent Labs    06/19/19 0537  CREATININE 0.80    Intake/Output Summary (Last 24 hours) at 06/21/2019 0810 Last data filed at 06/20/2019 1440 Gross per 24 hour  Intake --  Output 850 ml  Net -850 ml     Physical Exam: Vital Signs Blood pressure 114/84, pulse 87, temperature 98.1 F (36.7 C), resp. rate 18, height 5\' 11"  (1.803 m), weight 66.3 kg, SpO2 92 %. HEENT exam shows gingivitis, Left upper molar fracture no buccal tenderness along alveolar ridge  General: No acute distress Mood and affect are appropriate Heart: Regular rate and rhythm no rubs murmurs or extra sounds Lungs: Clear to auscultation, breathing unlabored, no rales or wheezes Abdomen: Positive bowel sounds, soft nontender to palpation, nondistended Extremities: No clubbing, cyanosis, or edema Skin:see below, has psoriasis, order valisone Air mattress for skin  Neurologic: Cranial nerves II through XII intact, motor strength is 0/5 in Left  deltoid, bicep, tricep, grip, hip flexor, knee extensors, ankle dorsiflexor and plantar flexor 3- RIgght biceps, triceps, 2- finger flexion and ext, trace R hip knee ext synergy Sensory exam normal sensation to light touch and proprioception in bilateral upper and lower extremities Cerebellar exam NA d/t weakness  Musculoskeletal:  No joint swelling Dysarthria Expressive aphasia    Assessment/Plan: 1. 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  Physiatrist is providing close team supervision and 24 hour management of active medical problems listed below.  Physiatrist and rehab team continue to assess barriers to discharge/monitor patient progress toward functional and medical goals  Care Tool:  Bathing        Body parts bathed by helper: Right arm, Left arm, Chest, Abdomen, Front perineal area, Buttocks, Right upper leg, Left upper leg, Right lower leg, Left lower leg, Face     Bathing assist Assist Level: Dependent - Patient 0%     Upper Body Dressing/Undressing Upper body dressing   What is the patient wearing?: Pull over shirt    Upper body assist Assist Level: Dependent - Patient 0%    Lower Body Dressing/Undressing Lower body dressing      What is the patient wearing?: Incontinence brief     Lower body assist Assist for lower body dressing: Dependent - Patient 0%     Toileting Toileting    Toileting assist Assist for toileting: 2 Helpers     Transfers Chair/bed transfer  Transfers assist  Chair/bed transfer activity did not occur: Safety/medical concerns  Chair/bed transfer assist level: Dependent - Patient 0%     Locomotion Ambulation   Ambulation assist   Ambulation activity did not occur: Safety/medical concerns          Walk 10 feet activity   Assist  Walk 10 feet activity did not occur: Safety/medical concerns        Walk 50 feet activity   Assist Walk 50 feet with 2 turns activity did not occur: Safety/medical concerns  Walk 150 feet activity   Assist Walk 150 feet activity did not occur: Safety/medical concerns         Walk 10 feet on uneven surface  activity   Assist Walk 10 feet on uneven surfaces activity did not occur: Safety/medical concerns         Wheelchair     Assist     Wheelchair activity did not occur: Safety/medical concerns         Wheelchair 50 feet with 2 turns  activity    Assist    Wheelchair 50 feet with 2 turns activity did not occur: Safety/medical concerns       Wheelchair 150 feet activity     Assist  Wheelchair 150 feet activity did not occur: Safety/medical concerns       Blood pressure 114/84, pulse 87, temperature 98.1 F (36.7 C), resp. rate 18, height 5\' 11"  (1.803 m), weight 66.3 kg, SpO2 92 %.  Medical Problem List and Plan: 1.Dysarthria/quadriparesis/aphasiasecondary to multifocal infarcts mostly posterior circulation but also bilateral anterior circulation cardioembolic pattern as well as innominate artery occlusion, left ICA thrombus versus soft plaque, right V4 and basal artery occlusive thrombus  Continue CIR, PT,  OT 2. Antithrombotics: -DVT/anticoagulation:Lovenox  Creatinine within normal limits on 5/1 -antiplatelet therapy: Aspirin 325 mg and Plavix 75 mg daily- will not hold plavix until IR indicates PEG is scheduled  3. Pain Management:Tramadol as needed  Lumbar pain (location difficult to assess due to communication deficits)-  Xray unremarkable , technically suboptimal d/t contrast in colon this applies only to lateral images, AP looks OK , likely bedrest is causing increased pain.  According to family he has had chronic low back pain as well.      Now with dental pain- left upper molar ,  No sign of abscess, + gingivitis, check orthopantogram, ask dental to eval  4. Mood:Wellbutrin 150 mg twice daily, may need trial of Nuedexta, expect PBA given extensive posterior > anterior circulation infarcts -antipsychotic agents: Seroquel 25 mg 3 times daily as needed 5. Neuropsych: This patientis notcapable of making decisions on hisown behalf.  6. Skin/Wound Care:Routine skin checks- sacrococcygeal eschar, MASD, cont foam with collagenase on eschar , if no better in a week may need debridement 7. Fluids/Electrolytes/Nutrition:Routine in and outs  BMP within acceptable range on  4/26 8. Post stroke dysphagia. Dysphagia #1 honey thick liquids, advance diet as tolerated.    Consulted IR for PEG placement -national shortage of PEG tubes awaiting shipment Continue IVF 9. Orthostatic hypotension. Monitor with increased mobility Vitals:   06/20/19 1900 06/21/19 0504  BP: 139/83 114/84  Pulse: 76 87  Resp: 18 18  Temp: 98.1 F (36.7 C) 98.1 F (36.7 C)  SpO2:  96%    Diastolic elevated this morning, otherwise relatively controlled on 5/2 10. Hyperlipidemia. Lipitor 11. Tobacco abuse. NicoDerm patch.  Counseled when possible 12. Leukocytosis. -Per CT abd  RLL atelectasis vs PNA, given pt afebrile , WBCs trending down, more likely atelectasis   WBCs 14.7 on 4/26, await repeat  Afebrile   Continue to monitor  LOS: 9 days A FACE TO Henefer E Aneliz Carbary 06/21/2019, 8:10 AM

## 2019-06-21 NOTE — Plan of Care (Signed)
Problem: RH Balance Goal: LTG: Patient will maintain dynamic sitting balance (OT) Description: LTG:  Patient will maintain dynamic sitting balance with assistance during activities of daily living (OT) Flowsheets (Taken 06/21/2019 1621) LTG: Pt will maintain dynamic sitting balance during ADLs with: (goal downgraded based on slower progress than expected) Maximal Assistance - Patient 25 - 49% Note: goal downgraded based on slower progress than expected   Problem: Sit to Stand Goal: LTG:  Patient will perform sit to stand in prep for activites of daily living with assistance level (OT) Description: LTG:  Patient will perform sit to stand in prep for activites of daily living with assistance level (OT) Flowsheets (Taken 06/21/2019 1621) LTG: PT will perform sit to stand in prep for activites of daily living with assistance level: (goal downgraded based on slower progress than expected) Total Assistance - Patient < 25% Note: goal downgraded based on slower progress than expected   Problem: RH Eating Goal: LTG Patient will perform eating w/assist, cues/equip (OT) Description: LTG: Patient will perform eating with assist, with/without cues using equipment (OT) Flowsheets (Taken 06/21/2019 1621) LTG: Pt will perform eating with assistance level of: (goal downgraded based on slower progress than expected) Moderate Assistance - Patient 50 - 74% LTG: Pt will perform eating using equipment: Built up handle Note: goal downgraded based on slower progress than expected   Problem: RH Grooming Goal: LTG Patient will perform grooming w/assist,cues/equip (OT) Description: LTG: Patient will perform grooming with assist, with/without cues using equipment (OT) Flowsheets (Taken 06/21/2019 1621) LTG: Pt will perform grooming with assistance level of: (goal downgraded based on slower progress than expected) Maximal Assistance - Patient 25 - 49% Note: goal downgraded based on slower progress than expected   Problem:  RH Bathing Goal: LTG Patient will bathe all body parts with assist levels (OT) Description: LTG: Patient will bathe all body parts with assist levels (OT) Flowsheets (Taken 06/21/2019 1621) LTG: Pt will perform bathing with assistance level/cueing: (goal downgraded based on slower progress than expected) Total Assistance - Patient < 25% LTG: Position pt will perform bathing: Supine in bed Note: goal downgraded based on slower progress than expected   Problem: RH Dressing Goal: LTG Patient will perform lower body dressing w/assist (OT) Description: LTG: Patient will perform lower body dressing with assist, with/without cues in positioning using equipment (OT) Flowsheets (Taken 06/21/2019 1621) LTG: Pt will perform lower body dressing with assistance level of: (goal downgraded based on slower progress than expected) Total Assistance - Patient < 25% Note: goal downgraded based on slower progress than expected   Problem: RH Toileting Goal: LTG Patient will perform toileting task (3/3 steps) with assistance level (OT) Description: LTG: Patient will perform toileting task (3/3 steps) with assistance level (OT)  Flowsheets (Taken 06/21/2019 1621) LTG: Pt will perform toileting task (3/3 steps) with assistance level: (goal downgraded based on slower progress than expected) Total Assistance - Patient < 25% Note: goal downgraded based on slower progress than expected   Problem: RH Functional Use of Upper Extremity Goal: LTG Patient will use RT/LT upper extremity as a (OT) Description: LTG: Patient will use right/left upper extremity as a stabilizer/gross assist/diminished/nondominant/dominant level with assist, with/without cues during functional activity (OT) Flowsheets (Taken 06/21/2019 1621) LTG: Use of upper extremity in functional activities: (goal downgraded based on slower progress than expected)  LUE as a stabilizer  RUE as gross assist level Note: goal downgraded based on slower progress than  expected   Problem: RH Toilet Transfers Goal: LTG Patient will  perform toilet transfers w/assist (OT) Description: LTG: Patient will perform toilet transfers with assist, with/without cues using equipment (OT) Flowsheets (Taken 06/21/2019 1621) LTG: Pt will perform toilet transfers with assistance level of: (goal downgraded based on slower progress than expected) Maximal Assistance - Patient 25 - 49% Note: goal downgraded based on slower progress than expected

## 2019-06-21 NOTE — Progress Notes (Signed)
Occupational Therapy Weekly Progress Note  Patient Details  Name: Adrian [name] MRN: [id number] Date of Birth: 1965-03-17  Beginning of progress report period: June 13, 2019 End of progress report period: Jun 21, 2019  Today's Date: 06/21/2019 OT Individual Time: 0923-3007 OT Individual Time Calculation (min): 71 min    Patient has met 0 of 3 short term goals.  Adrian [name] is making slow progress with OT at this time secondary to the severity and complexity of his recent multiple CVAs.  He currently continues to need total assist for rolling in bed with LB selfcare tasks as well as total assist for supine to sit EOB.  He needs total assist for static sitting balance secondary to decreased trunk control and decreased ability to initiate balance correction secondary to motor planning deficits.  He has been able to maintain static sitting with min guard assist for short intervals of 5-20 seconds when therapist has assisted with placing him at midline.  He currently needs total assist +2 (pt 10%) for transfers via sliding board and because of his sacral wounds, the [facility] board has also been used to help decrease shearing of his buttocks.  LUE functional use is currently at [facility] stage II in the arm and hand with total hand over hand assist needed for simulated feeding or drinking tasks.  RUE functional use is slightly better and he is able to exhibit Brunnstrum stage III-IV movement in the arm and hand.  He still needs max assist to integrate into function, but he has been able to flex his elbow and bring the hand up to his eye to scratch it.  Feel overall his progress with likely remain slow, but he has demonstrated some improvement with active movement in both UEs and is tolerating being out of bed for longer periods of time in the tilt in space wheelchair.  Severe expressive aphasia remains as well.  Recommend continued OT at CIR level at this time.    Patient continues to demonstrate the following  deficits: muscle weakness and muscle paralysis, impaired timing and sequencing, abnormal tone, unbalanced muscle activation, motor apraxia, decreased coordination and decreased motor planning, decreased problem solving and decreased sitting balance, decreased standing balance, decreased postural control and decreased balance strategies and therefore will continue to benefit from skilled OT intervention to enhance overall performance with BADL and Reduce care partner burden.  Patient not progressing toward long term goals.  See goal revision..  Continue plan of care.  OT Short Term Goals Week 2:  OT Short Term Goal 1 (Week 2): Pt will completed rolling in the bed to assist with LB dressing with max assist. OT Short Term Goal 2 (Week 2): Pt will use the RUE with mod assist to donn his shirt over the left arm during supported UB dressing. OT Short Term Goal 3 (Week 2): Pt will maintain static sitting balance EOM or EOB with min guard assist for up to 2 mins in preparation for selfcare tasks.  Skilled Therapeutic Interventions/Progress Updates:    Pt in bed with family present to start session.  He was able to roll from supine to the left side of the bed with total assist and then transition to sitting at the same level.  Total assist for static sitting balance as well as for scooting out to the edge of the bed.  He completed squat pivot transfer to the tilt in space wheelchair with total assist +2 (pt 10%) and was taken to the ortho gym.  Placed pt in the standing frame for increased weightbearing through his LEs as well as UEs on the table.  Had him complete intervals in standing for 8 mins, 5 mins, and 4 mins with total assist for trunk control.  Had pt work on keeping his head up as well as trying to push off of his forearms placed on the table to assist with his balance.  While sitting to rest in between sets, he worked on [employer] [place] for shoulder flexion, elbow extension, elbow flexion, and digit  flexion extension.  Noted greater movement in the RUE and hand compared to the left.  Trace shoulder flexion with elbow extension noted in the LUE, whereas the RUE demonstrates Brunnstrum stage III movement and he was able to hold onto a cup and bring to mouth with mod assist for simulated drinking.  Finished session with scooting back into the tilt in space wheelchair with transition back to the room.  He chose to sit up in the wheelchair with family present.  Wheelchair cushion switched out from Roho to gel surface cushion in the sacral area.  When asked if it feels better he nodded his head yes.  Family in room at end of session with call button and phone in reach.    Therapy Documentation Precautions:  Precautions Precautions: Fall, Other (comment) Precaution Comments: check BP on left arm due to occlusion on R side. Restrictions Weight Bearing Restrictions: No   Pain: Pain Assessment Pain Scale: Faces Faces Pain Scale: Hurts a little bit Pain Type: Acute pain Pain Location: Buttocks Pain Orientation: Medial Pain Descriptors / Indicators: Discomfort Pain Onset: With Activity Pain Intervention(s): Emotional support;Repositioned Multiple Pain Sites: No ADL: See Care Tool Section for some details of mobility and selfcare  Therapy/Group: Individual Therapy  Derald Lorge OTR/L 06/21/2019, 4:02 PM

## 2019-06-21 NOTE — Progress Notes (Signed)
Physical Therapy Session Note  Patient Details  Name: DRAPER GALLON MRN: [id number] Date of Birth: [birth year]  Today's Date: 06/21/2019 PT Individual Time: 0900-0945 PT Individual Time Calculation (min): 45 min   Short Term Goals: Week 1:  PT Short Term Goal 1 (Week 1): Pt will perform bed<>chair transfer with Max assist +2 PT Short Term Goal 2 (Week 1): Pt will maintain static sitting balance EOB with UE support and Mod assist +1 PT Short Term Goal 3 (Week 1): Pt will tolerate x 5 minutes in the standing frame for LE weightbearing and standing tolerance, dependent assist.  Skilled Therapeutic Interventions/Progress Updates: Pt presented in bed with nsg present agreeable to therapy. Pt expressed that he was wet and wanted to be changed. Pt performed rolling L/R maxA x 1 to allow PTA to remove condom cath and change brief. Brief was clean however noted some blood on brief from sores. Nsg notified and mepilex applied to area with MD notified to assess wound. Once completed PTA threaded shorts total A and performed rolling L/R to pull shorts over hips. Pt performed supine to sit total A at EOB and set up for SB transfer to L. Pt adamant that pillow placed on Roho cushion due to pain at wound site. Pt performed SB transfer to L maxA x 2 with pillow sliding up chair and now providing more at back than cushion at buttocks. As pt's family in room decided to perform STS "three musketeers" style and son would be able to adjust pillow. Performed STS in this manner with total A x 2 with pt able to come to full stand. Upon sitting pt was able to sit deeper in chair and nodded yes that he felt more comfortable. When advised that next session would be in 30 min pt becoming agitated and want to know why he was being left in chair. Explained that it's important to sit up for short periods to work on OOB tolerance with family verbalizing understanding. Pt left in TIS in reclined position with belt alarm on, call bell  within reach and family present.      Therapy Documentation Precautions:  Precautions Precautions: Fall, Other (comment) Precaution Comments: check BP on left arm due to occlusion on R side. Restrictions Weight Bearing Restrictions: No General:   Vital Signs:   Pain: Pain Assessment Pain Scale: Faces Faces Pain Scale: Hurts little more Pain Type: Acute pain Pain Location: Buttocks Pain Orientation: Lower Pain Descriptors / Indicators: Sore Pain Onset: On-going Pain Intervention(s): Repositioned;Distraction Multiple Pain Sites: No  Therapy/Group: Individual Therapy  Devonda Pequignot  Ortencia Askari, PTA  06/21/2019, 3:58 PM

## 2019-06-22 ENCOUNTER — Inpatient Hospital Stay (HOSPITAL_COMMUNITY): Payer: Self-pay | Admitting: Physical Therapy

## 2019-06-22 ENCOUNTER — Inpatient Hospital Stay (HOSPITAL_COMMUNITY): Payer: Self-pay | Admitting: Speech Pathology

## 2019-06-22 ENCOUNTER — Inpatient Hospital Stay (HOSPITAL_COMMUNITY): Payer: Self-pay | Admitting: Occupational Therapy

## 2019-06-22 NOTE — Progress Notes (Signed)
Please order hand splints. Hand splints not available in room.

## 2019-06-22 NOTE — Plan of Care (Signed)
  Problem: Consults Goal: RH STROKE PATIENT EDUCATION Description: See Patient Education module for education specifics  Outcome: Progressing   Problem: RH BOWEL ELIMINATION Goal: RH STG MANAGE BOWEL WITH ASSISTANCE Description: STG Manage Bowel with mod Assistance. Outcome: Progressing Goal: RH STG MANAGE BOWEL W/MEDICATION W/ASSISTANCE Description: STG Manage Bowel with Medication with mod Assistance. Outcome: Progressing   Problem: RH BLADDER ELIMINATION Goal: RH STG MANAGE BLADDER WITH ASSISTANCE Description: STG Manage Bladder With mod Assistance Outcome: Progressing   Problem: RH SKIN INTEGRITY Goal: RH STG SKIN FREE OF INFECTION/BREAKDOWN Outcome: Progressing Goal: RH STG MAINTAIN SKIN INTEGRITY WITH ASSISTANCE Description: STG Maintain Skin Integrity With mod Assistance. Outcome: Progressing Goal: RH STG ABLE TO PERFORM INCISION/WOUND CARE W/ASSISTANCE Description: STG Able To Perform Incision/Wound Care With max Assistance. Outcome: Progressing   Problem: RH SAFETY Goal: RH STG ADHERE TO SAFETY PRECAUTIONS W/ASSISTANCE/DEVICE Description: STG Adhere to Safety Precautions With min Assistance/Device. Outcome: Progressing   Problem: RH COGNITION-NURSING Goal: RH STG USES MEMORY AIDS/STRATEGIES W/ASSIST TO PROBLEM SOLVE Description: STG Uses Memory Aids/Strategies With min Assistance to Problem Solve. Outcome: Progressing Goal: RH STG ANTICIPATES NEEDS/CALLS FOR ASSIST W/ASSIST/CUES Description: STG Anticipates Needs/Calls for Assist With min Assistance/Cues. Outcome: Progressing   Problem: RH PAIN MANAGEMENT Goal: RH STG PAIN MANAGED AT OR BELOW PT'S PAIN GOAL Description: Less than 3 out of 10 Outcome: Progressing   Problem: RH KNOWLEDGE DEFICIT Goal: RH STG INCREASE KNOWLEDGE OF HYPERTENSION Description: Patient/family will be able to identify medication to treat HTN along with 2 other nonpharmalogical interventions Outcome: Progressing Goal: RH STG INCREASE  KNOWLEDGE OF DYSPHAGIA/FLUID INTAKE Description: Patient will be able to demonstrate understanding of thickened fluid necessity Outcome: Progressing Goal: RH STG INCREASE KNOWLEGDE OF HYPERLIPIDEMIA Outcome: Progressing Goal: RH STG INCREASE KNOWLEDGE OF STROKE PROPHYLAXIS Description: Patient/Family will be able to verbalize to 3 medications for stroke prevention post discharge  Outcome: Progressing   

## 2019-06-22 NOTE — Progress Notes (Signed)
Occupational Therapy Session Note  Patient Details  Name: Adrian [name] MRN: [id number] Date of Birth: 1965/05/09  Today's Date: 06/22/2019 OT Individual Time: 1005-1106 OT Individual Time Calculation (min): 61 min    Short Term Goals: Week 2:  OT Short Term Goal 1 (Week 2): Pt will completed rolling in the bed to assist with LB dressing with max assist. OT Short Term Goal 2 (Week 2): Pt will use the RUE with mod assist to donn his shirt over the left arm during supported UB dressing. OT Short Term Goal 3 (Week 2): Pt will maintain static sitting balance EOM or EOB with min guard assist for up to 2 mins in preparation for selfcare tasks.  Skilled Therapeutic Interventions/Progress Updates:    Pt in bed to start session, stating he wants to go home.  Re-directed pt that he continues to need extensive therapy to get stronger as well as a peg tube before he can think about going home.  Pt still dysarthric so difficult to understand throughout session.  Still with increased pain in his buttocks overall which limits his sitting tolerance.  Pt's son questioned the possibility of using a doughnut in the bed or chair to help reduce pain.  Discussed that when pt is in bed to make sure he changes position and  is laying on his side as much as possible as well.   Son may purchase a doughnut to try, just to see if it helps at all.  Began with LB dressing in supine with total assist.  He was able to help raise the RLE for placing in the shorts leg but demonstrated only trace movement in the left.  Total assist for rolling to the right and left for pulling over his hips, but he could assist slightly better rolling to the left secondary to having greater strength in the RUE.  Total assist +2 (pt 10%) for supine to sit.  He then worked on donning his shirt with total +2 for sitting balance.  He was able to use the RUE with mod assist to thread the shirt sleeve over the left hand and part way up the lower arm.  Max hand  over hand assist needed for threading the right hand through with use of the left.  He needed total assist for pulling the shirt over the head and then down over his trunk.  Worked on static sitting balance EOB.  Total assist still needed overall, but pt with short episodes of needing only min guard assist with duration from 20 seconds to 1 min.  Finished session with transfer to the wheelchair with total assist +2 (pt 10%) squat pivot without the sliding board.  Therapist then provided total assist for oral hygiene with the suction brush.  Finished session with pt up in the wheelchair with family present.  Plan for pt to stay up about 45 mins if possible to increase sitting endurance, however this continues to be an issue secondary to his buttocks pain.      Therapy Documentation Precautions:  Precautions Precautions: Fall, Other (comment) Precaution Comments: check BP on left arm due to occlusion on R side. Restrictions Weight Bearing Restrictions: No  Pain: Pain Assessment Pain Scale: Faces Faces Pain Scale: Hurts little more Pain Type: Acute pain Pain Location: Buttocks Pain Orientation: Left Pain Descriptors / Indicators: Discomfort Pain Intervention(s): MD notified (Comment);Rest;Relaxation ADL: See Care Tool Section for some details of mobility and selfcare  Therapy/Group: Individual Therapy  Alizandra Loh OTR/L 06/22/2019, 12:09 PM

## 2019-06-22 NOTE — Progress Notes (Signed)
Physical Therapy Weekly Progress Note  Patient Details  Name: Adrian [name] MRN: [id number] Date of Birth: 10/02/1965  Beginning of progress report period: June 13, 2019 End of progress report period: Jun 22, 2019  Today's Date: 06/22/2019 PT Individual Time: 4076-8088 and 1103-1594 PT Individual Time Calculation (min): 74 min and 49 min   Patient has met 0 of 3 short term goals.  Adrian [name] is making slow progress with PT at this time due to the severity and complexity of his recent multiple CVAs. He currently requires total assist for rolling R/L and supine<>sit as well as +2 total assist for bed<>chair transfers either via beasy board or squat pivot transfers. Pt is able to maintain static sitting balance for up to 20seconds with CGA once assisted to midline but then has LOB requiring total assist to maintain upright with pt demonstrating lack of righting reactions or onset of trunk activation or R UE movement to maintain upright. Pt has participated in dependent standing using standing frame for up to 2-94mnutes in one bout demonstrating activation of R LE glute/quads to extend partially out of the harness support but slight to no activation noted in L LE extensors.   Patient continues to demonstrate the following deficits muscle weakness, muscle joint tightness and muscle paralysis, decreased cardiorespiratoy endurance, impaired timing and sequencing, abnormal tone, unbalanced muscle activation, motor apraxia, decreased coordination and decreased motor planning, decreased midline orientation and ideational apraxia, decreased initiation, decreased attention, decreased awareness, decreased problem solving, decreased safety awareness, decreased memory and delayed processing and decreased sitting balance, decreased standing balance, decreased postural control and decreased balance strategies and therefore will continue to benefit from skilled PT intervention to increase functional independence with  mobility.  Patient progressing toward long term goals..  Continue plan of care.  PT Short Term Goals Week 1:  PT Short Term Goal 1 (Week 1): Pt will perform bed<>chair transfer with Max assist +2 PT Short Term Goal 1 - Progress (Week 1): Progressing toward goal PT Short Term Goal 2 (Week 1): Pt will maintain static sitting balance EOB with UE support and Mod assist +1 PT Short Term Goal 2 - Progress (Week 1): Progressing toward goal PT Short Term Goal 3 (Week 1): Pt will tolerate x 5 minutes in the standing frame for LE weightbearing and standing tolerance, dependent assist. PT Short Term Goal 3 - Progress (Week 1): Progressing toward goal Week 2:  PT Short Term Goal 1 (Week 2): Pt will roll R/L with mod asssist of 1 PT Short Term Goal 2 (Week 2): Pt will perform supine<>sit with max assist of 1 PT Short Term Goal 3 (Week 2): Pt will perform bed<>chair transfers with total assist of 1 PT Short Term Goal 4 (Week 2): Pt will tolerate x 5 minutes in the standing frame for LE weightbearing and standing tolerance, dependent assist.  Skilled Therapeutic Interventions/Progress Updates:  Ambulation/gait training;Community reintegration;DME/adaptive equipment instruction;Neuromuscular re-education;Psychosocial support;Stair training;UE/LE Strength taining/ROM;Wheelchair propulsion/positioning;Balance/vestibular training;Discharge planning;Pain management;Functional electrical stimulation;Skin care/wound management;Therapeutic Activities;UE/LE Coordination activities;Cognitive remediation/compensation;Disease management/prevention;Functional mobility training;Patient/family education;Splinting/orthotics;Therapeutic Exercise;Visual/perceptual remediation/compensation   Session 1: Pt received supine in bed with RN present and pt agreeable to therapy session. Pt continues to be dysarthric with difficulty understanding his speech during session. Supine>sitting L EOB with total assist for B LE management and  trunk upright. Pt continues to demonstrate significant trunk control/sitting balance impairments requiring max/total assist to prevent LOB while +2 assist placed beasy board. R lateral scoot to TIS w/c using beasy board with total  assist of 1 and +2 present for steadying w/c and beasy board management.  Transported to/from gym in w/c for time management and energy conservation. Participated in 4x sit<>stands using standing frame targeting upright activity tolerance, tone management, B LE extension stretching, and B LE weightbearing with proprioceptive input - pt tolerating standing ~63mnutes each bout with progression to decreased sling support with pt demonstrating activation of R LE quad and glutes to extend outside of the sling but requires max/total facilitation for L LE hip/knee extension - demonstrates slight forward trunk flexion throughout despite +2 assist manually facilitating increased extension. Therapist retrieved and adjusted Roho air cushion to place in TIS w/c for improved pressure relief. Transported back to room and pt requesting to return to bed. L lateral scoot transfer to EOB with +2 assist for beasy board placement and total assist for scooting. Tolerated sitting EOB ~54mutes focusing on trunk control but pt requires total assist to prevent LOB with slight to no righting reactions. Sit>supine with total assist for trunk descent and B LE management. Performed rolling R/L in bed with total assist with manual facilitation for increased use of B UEs to perform task and placing LEs in hooklying position. Supine bridging x8 reps with pt demonstrating slight activation and unable to clear hips. With encouragement pt agreeable to lie on R side for pressure relief - pt therapeutically positioned with pillows for pressure relief. Left with needs in reach and family present.  Session 2: Pt received R semi-sidelying with wife and son present and pt agreeable to therapy session. Due to adductor tone pt  unable to tolerate pillow between knees in sidelying and reports he was uncomfortable on his side. Supine>sitting L EOB with total assist for B LE management and trunk upright with pt demonstrating lack of any trunk activation. R squat pivot to TIS w/c with +2 total assist for lifting/pivoting hips and trunk control. Pt demonstrates significant L LE clonus in sitting throughout session. Transported to/from gym in w/c for time management and energy conservation. R squat pivot to EOM with +2 total assist. Sitting EOM performed seated balance tasks with therapist tying sheet support to promote increased anterior pelvic tilt and upright posture - pt able to maintain static sitting ~20seconds prior to requiring total assist to prevent LOB (>50% of the time has posterior LOB) - therapist provided manual facilitation for increased trunk extension and scapular retraction with minimal to no activation noted. Sit>supine with +2 total assist for B LE management and trunk descent. Attempted supine to/from rolling R/L with pt able to activate cervical flexion to roll with slight shoulder protraction on R side (none on L) but pt unable to lift head or initiate lifting scapula. In R sidelying, attempted L pelvic PNF of anterior elevation and posterior depression with therapist manually facilitating but no activation noted by pt despite multiple attempts. Sidelying>supine>sitting EOM with total assist. L squat pivot back to TIS w/c with +2 total assist. Despite encouragement to remain sitting in TIS w/c for meal pt defers stating "I'm not going to eat" therapist educated pt on importance of nutrients for wound healing and energy for therapy but pt continued to report he doesn't plan on eating and doesn't like the food. L squat pivot to EOB with +2 total assist. Sit>sidelying>supine with +2 total assist. Therapits educated pt on importance of wearing PRAFOs at night due to PF tightness noted in standing earlier. Pt left supine in  bed with needs in reach and family present.   Therapy Documentation  Precautions:  Precautions Precautions: Fall, Other (comment) Precaution Comments: check BP on left arm due to occlusion on R side. Restrictions Weight Bearing Restrictions: No  Pain: Session 1: Reports buttocks pain at pressure sore sights - repositioned during session to provide pressure relief and upgraded pts cushion for increased pressure relief.   Session 2: Repeatedly reports his butt is "on fire" during mobility tasks due to pressure sore - therapist repositioned and modified interventions to minimize shearing forces.   Therapy/Group: Individual Therapy  Tawana Scale, PT, DPT 06/22/2019, 1:01 PM

## 2019-06-22 NOTE — Progress Notes (Signed)
[facility] PHYSICAL MEDICINE & REHABILITATION PROGRESS NOTE   Subjective/Complaints: Mainly unintelligible but able to state "when will tube be put in?" No issues overnite , pt asking when tube will be placed.  Appreciate IR note  ROS: Limited due to dysarthria/expressive aphasia  Objective:   No results found. Recent Labs    06/21/19 1235  WBC 15.3*  HGB 13.4  HCT 39.2  PLT 540*   No results for input(s): NA, K, CL, CO2, GLUCOSE, BUN, CREATININE, CALCIUM in the last 72 hours.  Intake/Output Summary (Last 24 hours) at 06/22/2019 0746 Last data filed at 06/21/2019 0859 Gross per 24 hour  Intake --  Output 300 ml  Net -300 ml     Physical Exam: Vital Signs Blood pressure 134/82, pulse 91, temperature 97.8 F (36.6 C), temperature source Oral, resp. rate 18, height 5\' 11"  (1.803 m), weight 66.3 kg, SpO2 98 %. HEENT exam shows gingivitis, Left upper molar fracture no buccal tenderness along alveolar ridge   General: No acute distress Mood and affect are appropriate Heart: Regular rate and rhythm no rubs murmurs or extra sounds Lungs: Clear to auscultation, breathing unlabored, no rales or wheezes Abdomen: Positive bowel sounds, soft nontender to palpation, nondistended Extremities: No clubbing, cyanosis, or edema  Skin:see below, has facial dermatitis erythema and flaking , order valisone Air mattress for skin  Neurologic: Cranial nerves II through XII intact, motor strength is 0/5 in Left  deltoid, bicep, tricep, grip, hip flexor, knee extensors, ankle dorsiflexor and plantar flexor 3- RIgght biceps, triceps, 2- finger flexion and ext, trace R hip knee ext synergy Sensory exam normal sensation to light touch and proprioception in bilateral upper and lower extremities Cerebellar exam NA d/t weakness  Musculoskeletal:  No joint swelling Dysarthria Expressive aphasia    Assessment/Plan: 1. 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  Physiatrist is providing close team supervision and 24 hour management of active medical problems listed below.  Physiatrist and rehab team continue to assess barriers to discharge/monitor patient progress toward functional and medical goals  Care Tool:  Bathing        Body parts bathed by helper: Right arm, Left arm, Chest, Abdomen, Front perineal area, Buttocks, Right upper leg, Left upper leg, Right lower leg, Left lower leg, Face     Bathing assist Assist Level: Dependent - Patient 0%     Upper Body Dressing/Undressing Upper body dressing   What is the patient wearing?: Pull over shirt    Upper body assist Assist Level: Dependent - Patient 0%    Lower Body Dressing/Undressing Lower body dressing      What is the patient wearing?: Incontinence brief     Lower body assist Assist for lower body dressing: Dependent - Patient 0%     Toileting Toileting    Toileting assist Assist for toileting: 2 Helpers     Transfers Chair/bed transfer  Transfers assist  Chair/bed transfer activity did not occur: Safety/medical concerns  Chair/bed transfer assist level: Total Assistance - Patient < 25%     Locomotion Ambulation   Ambulation assist   Ambulation activity did not occur: Safety/medical concerns          Walk 10 feet activity   Assist  Walk 10 feet activity did not occur: Safety/medical concerns        Walk 50 feet activity   Assist Walk 50  feet with 2 turns activity did not occur: Safety/medical concerns         Walk 150 feet activity   Assist Walk 150 feet activity did not occur: Safety/medical concerns         Walk 10 feet on uneven surface  activity   Assist Walk 10 feet on uneven surfaces activity did not occur: Safety/medical concerns         Wheelchair     Assist     Wheelchair activity did not  occur: Safety/medical concerns         Wheelchair 50 feet with 2 turns activity    Assist    Wheelchair 50 feet with 2 turns activity did not occur: Safety/medical concerns       Wheelchair 150 feet activity     Assist  Wheelchair 150 feet activity did not occur: Safety/medical concerns       Blood pressure 134/82, pulse 91, temperature 97.8 F (36.6 C), temperature source Oral, resp. rate 18, height 5\' 11"  (1.803 m), weight 66.3 kg, SpO2 98 %.  Medical Problem List and Plan: 1.Dysarthria/quadriparesis/aphasiasecondary to multifocal infarcts mostly posterior circulation but also bilateral anterior circulation cardioembolic pattern as well as innominate artery occlusion, left ICA thrombus versus soft plaque, right V4 and basal artery occlusive thrombus  Continue CIR, PT,  OT, team conf in am  2. Antithrombotics: -DVT/anticoagulation:Lovenox  Creatinine within normal limits on 5/1 -antiplatelet therapy: Aspirin 325 mg and Plavix 75 mg daily- will not hold plavix until IR indicates PEG is scheduled  3. Pain Management:Tramadol as needed  Lumbar pain (location difficult to assess due to communication deficits)-  Xray unremarkable , technically suboptimal d/t contrast in colon this applies only to lateral images, AP looks OK , likely bedrest is causing increased pain.  According to family he has had chronic low back pain as well.      Now with dental pain- left upper molar ,  No sign of abscess, + gingivitis, unable to do orthopantogram because of poor sitting balance , ask dental to eval  4. Mood:Wellbutrin 150 mg twice daily, may need trial of Nuedexta, expect PBA given extensive posterior > anterior circulation infarcts -antipsychotic agents: Seroquel 25 mg 3 times daily as needed 5. Neuropsych: This patientis notcapable of making decisions on hisown behalf.  6. Skin/Wound Care:Routine skin checks- sacrococcygeal eschar, MASD, cont foam  with collagenase on eschar , if no better in a week may need debridement Seborrheic dermatitis - start valisone  7. Fluids/Electrolytes/Nutrition:Routine in and outs  BMP within acceptable range on 4/26 8. Post stroke dysphagia. Dysphagia #1 honey thick liquids, advance diet as tolerated.    Consulted IR for PEG placement -national shortage of PEG tubes awaiting shipment Continue IVF 9. Orthostatic hypotension. Monitor with increased mobility Vitals:   06/21/19 2007 06/22/19 0603  BP: (!) 144/90 134/82  Pulse: 81 91  Resp: 18 18  Temp: 97.7 F (36.5 C) 97.8 F (36.6 C)  SpO2: 95% 98%    Controlled 5/4 10. Hyperlipidemia. Lipitor 11. Tobacco abuse. NicoDerm patch.  Counseled when possible 12. Leukocytosis. -Per CT abd  RLL atelectasis vs PNA, given pt afebrile , WBCselevated but stable     Continue to monitor  LOS: 10 days Linton Hall E Jafet Wissing 06/22/2019, 7:46 AM

## 2019-06-22 NOTE — Progress Notes (Signed)
Speech Language Pathology Daily Session Note  Patient Details  Name: Adrian [name] MRN: [id number] Date of Birth: Aug 05, 1965  Today's Date: 06/22/2019 SLP Individual Time: 0830-0859 SLP Individual Time Calculation (min): 29 min  Short Term Goals: Week 2: SLP Short Term Goal 1 (Week 2): Pt will consume trials of dysphagia 2 solids with efficient mastication and oral clearance X4 with Mod A cues for use of swallow strategies. SLP Short Term Goal 2 (Week 2): Pt will consume therapeutic trials of ice and/or thin H2O with adequate oral containment and minimal overt s/sx aspiration with Max A multimodal cues for accurate use of chin tuck maneuver. SLP Short Term Goal 3 (Week 2): Pt will verbalize at the word level to achieve 30% intelligibility with Max A multimodal cueing for intelligibility strategies. SLP Short Term Goal 4 (Week 2): Pt communicate basic wants and needs via multimodal means with Mod A cues. SLP Short Term Goal 5 (Week 2): Pt will sustain attention to functional tasks for 5-7 minutes with Max A multimodal cues. SLP Short Term Goal 6 (Week 2): Pt will perform 25 reps of EMST exercises to increase breath support and voice with Mod A multimodal cues for accuracy.  Skilled Therapeutic Interventions: Pt was seen for skilled ST targeting speech and attention. SLP provided Max A verbal cueing for use of pacing and increased vocal intensity to pt to communicate urgent messages regarding pain and preferences for positioning. He also required Max A for sustained attention to functional conversation to address his needs in 2-3 minute intervals today, as well as basic problem solving - this is an increase in cueing needed in comparison to previous encounters. Due to pt's reports of chest pain (RN aware), no EMST exercises attempted today. He did have success demonstrating use of soft call bell today. Pt left laying in bed with alarm set and needs within reach, RN present. Continue per current plan of  care.          Pain Pain Assessment Pain Scale: Faces Faces Pain Scale: Hurts a little bit Pain Type: Acute pain Pain Location: Chest Pain Orientation: Left Pain Descriptors / Indicators: Discomfort Pain Onset: Gradual Pain Intervention(s): RN made aware;Rest  Therapy/Group: Individual Therapy  [name] 06/22/2019, 7:06 AM

## 2019-06-22 NOTE — Progress Notes (Signed)
Patient c/o sharp chest pain on his left side, pointed to his heart. VS WNL. After a few minutes, pain subsided. Dr. Wynn Banker was notified.

## 2019-06-22 NOTE — Progress Notes (Signed)
Patient was frustrated, c/o pain on his coccyx. Educated the patient and his family about why we can't use a doughnut and the importance of turning q 2h per WOC RN. Patient is already on an air mattress. Patient is now joking with staff and laughing.

## 2019-06-22 NOTE — Progress Notes (Signed)
Patient wore his prevalon boots once for about an hour and refused to wear it the rest of the day.

## 2019-06-23 ENCOUNTER — Inpatient Hospital Stay (HOSPITAL_COMMUNITY): Payer: Self-pay | Admitting: Occupational Therapy

## 2019-06-23 ENCOUNTER — Inpatient Hospital Stay (HOSPITAL_COMMUNITY): Payer: Self-pay

## 2019-06-23 ENCOUNTER — Inpatient Hospital Stay (HOSPITAL_COMMUNITY): Payer: Self-pay | Admitting: Physical Therapy

## 2019-06-23 MED ORDER — TRIAMCINOLONE 0.1 % CREAM:EUCERIN CREAM 1:1
TOPICAL_CREAM | Freq: Three times a day (TID) | CUTANEOUS | Status: DC
  Administered 2019-07-07: 1 via TOPICAL
  Filled 2019-06-23 (×3): qty 1

## 2019-06-23 MED ORDER — PRO-STAT SUGAR FREE PO LIQD
30.0000 mL | Freq: Two times a day (BID) | ORAL | Status: DC
  Administered 2019-06-23 – 2019-07-08 (×16): 30 mL via ORAL
  Filled 2019-06-23 (×30): qty 30

## 2019-06-23 NOTE — Progress Notes (Signed)
Patient refusing to wear prafo boots at night.

## 2019-06-23 NOTE — Progress Notes (Signed)
Orthopedic Tech Progress Note Patient Details:  [name] 07-05-1965 [id number] Patient did not want to be touched Ortho Devices Type of Ortho Device: Abduction pillow Ortho Device/Splint Location: LE Ortho Device/Splint Interventions: Application   Post Interventions Patient Tolerated: Refused intervention   [name] [name] 06/23/2019, 8:51 AM

## 2019-06-23 NOTE — Progress Notes (Signed)
Hip abduction pillow was delivered but patient refused to try it so the ortho tech took it back. Notified provider of patient's non-compliance to measures of preventing/worsening pressure injuries.

## 2019-06-23 NOTE — Progress Notes (Signed)
Occupational Therapy Session Note  Patient Details  Name: Adrian [name] MRN: [id number] Date of Birth: 18-Mar-1965  Today's Date: 06/23/2019 OT Individual Time: 7829-5621 OT Individual Time Calculation (min): 46 min    Short Term Goals: Week 2:  OT Short Term Goal 1 (Week 2): Pt will completed rolling in the bed to assist with LB dressing with max assist. OT Short Term Goal 2 (Week 2): Pt will use the RUE with mod assist to donn his shirt over the left arm during supported UB dressing. OT Short Term Goal 3 (Week 2): Pt will maintain static sitting balance EOM or EOB with min guard assist for up to 2 mins in preparation for selfcare tasks.  Skilled Therapeutic Interventions/Progress Updates:    Pt sitting in tilt in space wheelchair to start session, agreeable to getting out of the room.  He was able to transfer from the wheelchair to the therapy mat with total assist +2 (pt 10%) for squat pivot transfer.  He then worked on sitting balance EOM.  Total assist was needed overall for static sitting, but he did exhibit some episodes of min guard for up to 2.5 mins with mod instructional cueing for correction of sitting balance with initiation of head movements.  He was able to also work on rolling on the mat with total assist as well, but pt unable to tolerate laying in supine for starting position secondary to buttocks pain.  He transitioned back to sitting with total assist and worked on functional reach with the RUE to pick up foam blocks.  Mod facilitation needed for reaching to knee level to pick up the block and then place it on the mat.  Max assist to complete the same reaching task with the Adrian.  Noted increased flexor tone in the left elbow as well this session.  Finished session with transfer back to the wheelchair and then back to the bed with total assist +2 (pt 10%).  Pt left in sidelying for positioning to relieve pressure on his bottom.  Pt's family present as well with call button in reach.    Therapy Documentation Precautions:  Precautions Precautions: Fall, Other (comment) Precaution Comments: check BP on left arm due to occlusion on R side. Restrictions Weight Bearing Restrictions: No  Pain: Pain Assessment Pain Scale: Faces Faces Pain Scale: Hurts little more Pain Location: Buttocks Pain Orientation: Medial Pain Descriptors / Indicators: Discomfort Pain Onset: With Activity Pain Intervention(s): Repositioned;Emotional support ADL: See Care Tool Section for some details of mobility and selfcare  Therapy/Group: Individual Therapy  Cyann Venti OTR/L 06/23/2019, 12:08 PM

## 2019-06-23 NOTE — Progress Notes (Signed)
Speech Language Pathology Daily Session Note  Patient Details  Name: EMERSYN KOTARSKI MRN: [id number] Date of Birth: 10/13/1965  Today's Date: 06/23/2019 SLP Individual Time: 1500-1530 SLP Individual Time Calculation (min): 30 min  Short Term Goals: Week 2: SLP Short Term Goal 1 (Week 2): Pt will consume trials of dysphagia 2 solids with efficient mastication and oral clearance X4 with Mod A cues for use of swallow strategies. SLP Short Term Goal 2 (Week 2): Pt will consume therapeutic trials of ice and/or thin H2O with adequate oral containment and minimal overt s/sx aspiration with Max A multimodal cues for accurate use of chin tuck maneuver. SLP Short Term Goal 3 (Week 2): Pt will verbalize at the word level to achieve 30% intelligibility with Max A multimodal cueing for intelligibility strategies. SLP Short Term Goal 4 (Week 2): Pt communicate basic wants and needs via multimodal means with Mod A cues. SLP Short Term Goal 5 (Week 2): Pt will sustain attention to functional tasks for 5-7 minutes with Max A multimodal cues. SLP Short Term Goal 6 (Week 2): Pt will perform 25 reps of EMST exercises to increase breath support and voice with Mod A multimodal cues for accuracy.  Skilled Therapeutic Interventions: Skilled ST services focused on swallow and speech skills. Pt's wife reported increase in PO intake at lunch consuming dys 1 (mashed potatoes and 1/2 puree pineapple.)  Pt was agreeable to dys 2 trials, however only able to preform limited oral care due to dental pain on left side. Pt demonstrated poor lingual manipulation and mastication when consuming dys 2 texture trials, requiring follow up of puree to clear bolus. Pt refused further trials of dys 2, consuming remainder of pureed pineapple and honey thick liquid via TSP. Pt required max A verbal cues to communicate at word or simple phrase level only to express wants/needs with 30% intelligibility. Pt was left in room with wife, call bell within  reach and bed alarm set. ST recommends to continue skilled ST services.      Pain    Therapy/Group: Individual Therapy  Fallan Mccarey  [facility] 06/23/2019, 8:25 AM

## 2019-06-23 NOTE — Progress Notes (Signed)
Physical Therapy Session Note  Patient Details  Name: Adrian [name] MRN: [id number] Date of Birth: Jul 07, 1965  Today's Date: 06/23/2019 PT Individual Time: 9323-5573 PT Individual Time Calculation (min): 72 min   Short Term Goals: Week 2:  PT Short Term Goal 1 (Week 2): Pt will roll R/L with mod asssist of 1 PT Short Term Goal 2 (Week 2): Pt will perform supine<>sit with max assist of 1 PT Short Term Goal 3 (Week 2): Pt will perform bed<>chair transfers with total assist of 1 PT Short Term Goal 4 (Week 2): Pt will tolerate x 5 minutes in the standing frame for LE weightbearing and standing tolerance, dependent assist. PT Short Term Goal 5 (Week 2): Pt will tolerate sitting in TIS w/c for 1 hour outside of therapy session  Skilled Therapeutic Interventions/Progress Updates: Pt presents supine in bed and agreeable to therapy.  Pt required total assist fro log roll to left after positioned in hook-lying position.  Pt reaches across w/ right hand for siderail.  Pt Pt requires total assist fro sup to sit and assist to maintain seated balance of mod A.  Pt performed SPT w/ assist of 2 bed > TIS.  Pt tilted back and taken to gym.  Pt transferred to sling for standing frame w/ max A.  Pt tolerated standing frame x 4 trials approx. 5-6'.  Pt w/ cueing for glut activation, as well as upright posture and use of mirror for mid-line stance.  Bilateral FA placed on tray and cuing for use of same.  Pt returned to room and performed SPT w/ total assist w/c > bed.  Pt performed supine LE there ex including abd/add and HS, AAROM for hip/knee flexion, manual resistance for extension, right > left LE.  Pt positioned in right sidelying after positioned in hooklying and pt assisted for roll to right.  Nursing notified of positioning and need for change of position.  All needs in reach.  Spouse present at conclusion of therapy.     Therapy Documentation Precautions:  Precautions Precautions: Fall, Other  (comment) Precaution Comments: check BP on left arm due to occlusion on R side. Restrictions Weight Bearing Restrictions: No General:   Vital Signs:  Pain: c/o pain in buttocks w/o quantifying number. Pain Assessment Pain Scale: Faces Faces Pain Scale: Hurts little more Pain Location: Buttocks Pain Orientation: Medial Pain Descriptors / Indicators: Discomfort Pain Onset: With Activity Pain Intervention(s): Repositioned;Emotional support Mobility:      Therapy/Group: Individual Therapy  [name] 06/23/2019, 2:36 PM

## 2019-06-23 NOTE — Patient Care Conference (Signed)
Inpatient RehabilitationTeam Conference and Plan of Care Update Date: 06/23/2019   Time 10:35 AM    Patient Name: Adrian [name]      Medical Record Number: [id number]  Date of Birth: April 14, 1965 Sex: Male         Room/Bed: 4W16C/4W16C-02 Payor Info: Payor: MEDICAID POTENTIAL / Plan: MEDICAID POTENTIAL / Product Type: *No Product type* /    Admit Date/Time:  06/12/2019  4:39 PM  Primary Diagnosis:  Basilar artery occlusion  Patient Active Problem List   Diagnosis Date Noted  . Blood pressure increase diastolic   . Dysphagia due to recent cerebrovascular accident   . Lumbago   . Orthostasis   . 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 Depressive reaction   . Labile blood pressure   . Dysphagia, post-stroke   . Cardioembolic stroke (HCC) 06/12/2019  . Chronic right-sided low back pain with right-sided sciatica   . Tobacco abuse   . Leukocytosis   . Prediabetes   . CVA (cerebral vascular accident) (HCC) 06/02/2019  . Basilar artery occlusion 06/02/2019    Expected Discharge Date: Expected Discharge Date: 07/09/19  Team Members Present: Physician leading conference: Dr. Claudette Laws Care Coodinator Present: Roderic Palau, RN, MSN;Christina Vita Barley, BSW;Deborah Cedric Fishman, RN, BSN, CRRN Nurse Present: Rosiland Oz, RN PT Present: Midge Minium, PT OT Present: Perrin Maltese, OT SLP Present: Suzzette Righter, CF-SLP PPS Coordinator present : Edson Snowball, Park Breed, SLP     Current Status/Progress Goal Weekly Team Focus  Bowel/Bladder   Incontinent of bladder, cont of bowel.   Maintain bowel and bladder continence.  toilette q2-3h and assess throuout shift.   Swallow/Nutrition/ Hydration   dys 1 (puree), honey, trials of thin with chin tuck at bedside ST only, Max A  Min (likely need to downgrade)  tolerance current diet, continue trials of thin with chin tuck, repeat MBSS in the next 7-10 days   ADL's   total assist for rolling in the bed and for  supine to sit.  He still needs total assist for LB dressing in supine with total for UB dressing sitting supported.  total assist is also needed for static sitting balance but he has demonstrated short episodes of 1 min or less with min guard assist.  RUE is Brunnstrum stage III-IV and LUE is stage II  downgraded to max to ttotal assist  selfcare retraining, transfer training, balance retraining, therapeutic exercise, DME education, neuromuscular re-education   Mobility   total assist for rolling, total assist supine<>sit, total +2 assist squat pivot or beasy board bed<>chair transfers, static sitting up to 20seocnds with CGA then requiring total assist when LOB occurs, tolerates standing frame for up to 2-87minutes with dependent assist  minA sitting balance, minA/modA sit to stand and transfers, maxA ambulation with therapist, Supervision WC  tone management, upright activity tolerance, standing frame/tilt table, sitting balance/trunk control, transfer training, pt education   Communication   Max A word/phrase level, severe dysarthria, pacing (one word at time with pause in between) most effective, slow down, increased vocal intensity  Mod A  intelligibility at word level, multimodal communication, continue EMST for voice/breath support   Safety/Cognition/ Behavioral Observations  Mod-Max A  Mod A  sustained attention, functional basic problem solving, initiation, participation   Pain   pt complains of pain at level 7-8/10 at wound tite on coccyx. Tramadol gien prn for pain   remain free of pain  assess pain q shift and medicate with prn as needed  Skin   unstageble to coxxyx, DTPI on left heel with mepiplex  maintain skin integrity and prevent any furthur breakdown.  asess skin q shift and prn     Rehab Goals Patient on target to meet rehab goals: Yes Rehab Goals Revised: Goals downgraded to MAX A and wife and son are aware of discharge goals *See Care Plan and progress notes for long and  short-term goals.     Barriers to Discharge  Current Status/Progress Possible Resolutions Date Resolved   Nursing                  PT                    OT                  SLP                SW Home environment access/layout;Decreased caregiver support wife disabled, son works Medical laboratory scientific officer; Agricultural consultant step entry to home Son working on Psychiatric nurse, daughter moving into home to assist at discharge          Discharge Planning/Teaching Needs:  Home with wife, daughter and son  ADLs, diet, medications, transfers, etc.   Team Discussion: MD awaiting PEG placement, PEG tubes are on back order, eschar upper sacrum, turn q2h, has air mattress, need to boost protein levels.  RN inc B/B, non compliant, appetite poor, NS IV 50/hr, wants to go home.  OT B/D tot A, resting hand splints are in the room in the closet, transfers tot A.  PT tot A +2 overall.  SLP trials of thins, will start trials of D2.   Revisions to Treatment Plan: N/A     Medical Summary Current Status: Severe dysarthria secondary dysphagia, sacral decubitus present upon admission to rehab Weekly Focus/Goal: Spasticity management, may need Botox injections, swallowing retraining  Barriers to Discharge: Other (comments);Incontinence;Medication compliance;Behavior;Nutrition means  Barriers to Discharge Comments: Poor compliance with positioning and splinting, national back order on PEG tubes Possible Resolutions to Barriers: Order placed for PEG, once tube available we will be able to schedule and stop anticoagulants prior to procedure   Continued Need for Acute Rehabilitation Level of Care: The patient requires daily medical management by a physician with specialized training in physical medicine and rehabilitation for the following reasons: Direction of a multidisciplinary physical rehabilitation program to maximize functional independence : Yes Medical management of patient stability for increased activity during participation in an  intensive rehabilitation regime.: Yes Analysis of laboratory values and/or radiology reports with any subsequent need for medication adjustment and/or medical intervention. : Yes   I attest that I was present, lead the team conference, and concur with the assessment and plan of the team.   Retta Diones 06/23/2019, 2:41 PM   Team conference was held via web/ teleconference due to [place] - 19

## 2019-06-23 NOTE — Progress Notes (Signed)
Occupational Therapy Session Note  Patient Details  Name: Adrian [name] MRN: [id number] Date of Birth: [birth year]  Today's Date: 06/23/2019 OT Individual Time: 0071-2197 OT Individual Time Calculation (min): 55 min    Short Term Goals: Week 2:  OT Short Term Goal 1 (Week 2): Pt will completed rolling in the bed to assist with LB dressing with max assist. OT Short Term Goal 2 (Week 2): Pt will use the RUE with mod assist to donn his shirt over the left arm during supported UB dressing. OT Short Term Goal 3 (Week 2): Pt will maintain static sitting balance EOM or EOB with min guard assist for up to 2 mins in preparation for selfcare tasks.  Skilled Therapeutic Interventions/Progress Updates:    Upon entering the room, pt supine in bed with c/o pain in buttocks but agreeable to OT intervention. Pt rolling L <> R with total A and able to lift feet partially off bed to thread pants. Total A to pull over B hips. Total A supine >sit from flat bed. Pt c/o pain once seated on EOB. B Knees blocked and pt pushing self forward on EOB as he attempts to lean posteriorly back towards bed. Total a +2 squat pivot transfer to the R this session into tilt in space wheelchair. OT assisted pt with washing hair while seated at sink. Pt tilted back in wheelchair for pressure relief and repositioning before exiting the room. Goal for pt to sit up 1 hour before next session. Soft call bell placed within reach and chair alarm belt donned.   Therapy Documentation Precautions:  Precautions Precautions: Fall, Other (comment) Precaution Comments: check BP on left arm due to occlusion on R side. Restrictions Weight Bearing Restrictions: No Pain: Pain Assessment Faces Pain Scale: Hurts little more ADL: ADL Eating: Dependent Where Assessed-Eating: Bed level Grooming: Dependent Where Assessed-Grooming: Bed level Upper Body Bathing: Dependent Where Assessed-Upper Body Bathing: Bed level Lower Body Bathing:  Dependent Where Assessed-Lower Body Bathing: Bed level Upper Body Dressing: Dependent Where Assessed-Upper Body Dressing: Bed level Lower Body Dressing: Dependent Where Assessed-Lower Body Dressing: Bed level Toileting: Dependent Where Assessed-Toileting: Bed level   Therapy/Group: Individual Therapy  Alen Bleacher 06/23/2019, 10:04 AM

## 2019-06-23 NOTE — Progress Notes (Signed)
[facility] PHYSICAL MEDICINE & REHABILITATION PROGRESS NOTE   Subjective/Complaints: Speech is more clear "butt is sore" In Otto Bock chair doing ADLs with OT No c/o dental pain today  ROS: Limited due to dysarthria/expressive aphasia  Objective:   No results found. Recent Labs    06/21/19 1235  WBC 15.3*  HGB 13.4  HCT 39.2  PLT 540*   No results for input(s): NA, K, CL, CO2, GLUCOSE, BUN, CREATININE, CALCIUM in the last 72 hours.  Intake/Output Summary (Last 24 hours) at 06/23/2019 0820 Last data filed at 06/23/2019 0730 Gross per 24 hour  Intake 0 ml  Output 250 ml  Net -250 ml     Physical Exam: Vital Signs Blood pressure (!) 154/95, pulse 88, temperature 98 F (36.7 C), resp. rate 18, height 5' 11" (1.803 m), weight 66.3 kg, SpO2 98 %. HEENT exam shows gingivitis, Left upper molar fracture no buccal breakdown.mild tenderness along alveolar ridge   General: No acute distress Mood and affect are appropriate Heart: Regular rate and rhythm no rubs murmurs or extra sounds Lungs: Clear to auscultation, breathing unlabored, no rales or wheezes Abdomen: Positive bowel sounds, soft nontender to palpation, nondistended Extremities: No clubbing, cyanosis, or edema  Skin:see below, has facial dermatitis erythema and flaking , Air mattress for skin  Neurologic: Cranial nerves II through XII intact, motor strength is 0/5 in Left  deltoid, bicep, tricep, grip, hip flexor, knee extensors, ankle dorsiflexor and plantar flexor 3- RIgght biceps, triceps, 2- finger flexion and ext, trace R hip knee ext synergy Sensory exam normal sensation to light touch and proprioception in bilateral upper and lower extremities Cerebellar exam NA d/t weakness  Musculoskeletal:  No joint swelling Dysarthria Expressive aphasia    Assessment/Plan: 1. 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  Physiatrist is providing close team supervision and 24 hour management of active medical problems listed below.  Physiatrist and rehab team continue to assess barriers to discharge/monitor patient progress toward functional and medical goals  Care Tool:  Bathing        Body parts bathed by helper: Right arm, Left arm, Chest, Abdomen, Front perineal area, Buttocks, Right upper leg, Left upper leg, Right lower leg, Left lower leg, Face     Bathing assist Assist Level: Dependent - Patient 0%     Upper Body Dressing/Undressing Upper body dressing   What is the patient wearing?: Pull over shirt    Upper body assist Assist Level: 2 Helpers(sitting unsupported EOB)    Lower Body Dressing/Undressing Lower body dressing      What is the patient wearing?: Incontinence brief     Lower body assist Assist for lower body dressing: Dependent - Patient 0%     Toileting Toileting    Toileting assist Assist for toileting: 2 Helpers     Transfers Chair/bed transfer  Transfers assist  Chair/bed transfer activity did not occur: Safety/medical concerns  Chair/bed transfer assist level: 2 Helpers(squat pivot)     Locomotion Ambulation   Ambulation assist   Ambulation activity did not occur: Safety/medical concerns          Walk 10 feet activity   Assist  Walk 10 feet activity did not occur: Safety/medical concerns        Walk 50 feet activity   Assist Walk 50 feet with 2 turns activity did not occur: Safety/medical concerns           Walk 150 feet activity   Assist Walk 150 feet activity did not occur: Safety/medical concerns         Walk 10 feet on uneven surface  activity   Assist Walk 10 feet on uneven surfaces activity did not occur: Safety/medical concerns         Wheelchair     Assist     Wheelchair activity did not occur: Safety/medical concerns         Wheelchair 50 feet with 2 turns  activity    Assist    Wheelchair 50 feet with 2 turns activity did not occur: Safety/medical concerns       Wheelchair 150 feet activity     Assist  Wheelchair 150 feet activity did not occur: Safety/medical concerns       Blood pressure (!) 154/95, pulse 88, temperature 98 F (36.7 C), resp. rate 18, height 5' 11" (1.803 m), weight 66.3 kg, SpO2 98 %.  Medical Problem List and Plan: 1.Dysarthria/quadriparesis/aphasiasecondary to multifocal infarcts mostly posterior circulation but also bilateral anterior circulation cardioembolic pattern as well as innominate artery occlusion, left ICA thrombus versus soft plaque, right V4 and basal artery occlusive thrombus  Continue CIR, PT,  OT,  Team conference today please see physician documentation under team conference tab, met with team  to discuss problems,progress, and goals. Formulized individual treatment plan based on medical history, underlying problem and comorbidities. 2. Antithrombotics: -DVT/anticoagulation:Lovenox  Creatinine within normal limits on 5/1 -antiplatelet therapy: Aspirin 325 mg and Plavix 75 mg daily- will not hold plavix until IR indicates PEG is scheduled  3. Pain Management:Tramadol as needed  Lumbar pain (location difficult to assess due to communication deficits)-  Xray unremarkable , technically suboptimal d/t contrast in colon this applies only to lateral images, AP looks OK , likely bedrest is causing increased pain.  According to family he has had chronic low back pain as well.       dental pain ? improved- left upper molar ,  No sign of abscess, + gingivitis, unable to do orthopantogram because of poor sitting balance , ask dental to eval  4. Mood:Wellbutrin 150 mg twice daily, may need trial of Nuedexta, expect PBA given extensive posterior > anterior circulation infarcts -antipsychotic agents: Seroquel 25 mg 3 times daily as needed 5. Neuropsych: This patientis  notcapable of making decisions on hisown behalf.  6. Skin/Wound Care:Routine skin checks- sacrococcygeal eschar, MASD, cont foam with collagenase on eschar , if no better in a week may need debridement Seborrheic dermatitis - start valisone  7. Fluids/Electrolytes/Nutrition:Routine in and outs  BMP within acceptable range on 4/26 8. Post stroke dysphagia. Dysphagia #1 honey thick liquids, advance diet as tolerated.    Consulted IR for PEG placement -national shortage of PEG tubes awaiting shipment Continue IVF 9. Orthostatic hypotension. Monitor with increased mobility Vitals:   06/22/19 2024 06/23/19 0544  BP: (!) 133/92 (!) 154/95  Pulse: 82 88  Resp: 17 18  Temp: 98.4 F (36.9 C) 98 F (36.7 C)  SpO2: 94% 98%    Controlled 5/5 10. Hyperlipidemia. Lipitor 11. Tobacco abuse. NicoDerm patch.  Counseled when possible 12. Leukocytosis. -Per CT abd  RLL atelectasis vs PNA, given pt afebrile , WBCs elevated but stable     Continue to monitor  LOS: 11 days A FACE TO FACE EVALUATION WAS PERFORMED   E  06/23/2019, 8:20 AM    

## 2019-06-24 ENCOUNTER — Inpatient Hospital Stay (HOSPITAL_COMMUNITY): Payer: Self-pay | Admitting: Physical Therapy

## 2019-06-24 ENCOUNTER — Inpatient Hospital Stay (HOSPITAL_COMMUNITY): Payer: Self-pay | Admitting: Occupational Therapy

## 2019-06-24 ENCOUNTER — Inpatient Hospital Stay (HOSPITAL_COMMUNITY): Payer: Self-pay | Admitting: Speech Pathology

## 2019-06-24 NOTE — Progress Notes (Signed)
Physical Therapy Session Note  Patient Details  Name: Adrian [name] MRN: [id number] Date of Birth: 1965/07/29  Today's Date: 06/24/2019 PT Individual Time: 8546-2703 and 5009-3818 and 2993-7169 PT Individual Time Calculation (min): 16 min  And 33 min and 61 min  and Today's Date: 06/24/2019 PT Missed Time: 26 Minutes Missed Time Reason: Patient fatigue;Pain  Short Term Goals: Week 2:  PT Short Term Goal 1 (Week 2): Pt will roll R/L with mod asssist of 1 PT Short Term Goal 2 (Week 2): Pt will perform supine<>sit with max assist of 1 PT Short Term Goal 3 (Week 2): Pt will perform bed<>chair transfers with total assist of 1 PT Short Term Goal 4 (Week 2): Pt will tolerate x 5 minutes in the standing frame for LE weightbearing and standing tolerance, dependent assist. PT Short Term Goal 5 (Week 2): Pt will tolerate sitting in TIS w/c for 1 hour outside of therapy session  Skilled Therapeutic Interventions/Progress Updates:    Session 1 [phone]): Pt received supine in bed with his wife, [name], present and pt reporting R posterior neck pain. Pt's wife holding heat pack from RN to apply to area. Pt declines therapy at this time stating his neck is hurting too bad to participate. Pt also reports buttocks pain from pressure sores and noted to be in supine. Pt agreeable to reposition into L sidelying for sacral pressure relief - max/total assist to achieve L sidelying with encouragement for R UE assist on bedrail. Therapist performed gentle soft tissue mobilization to R upper trap, rhomboids, and suboccipitals with only stiffness noted in suboccipitals.  Pt left L sidelying in bed therapeutically positioned for pressure relief with needs in reach, heat pack applied to his neck, and his wife present. Pt quick to fall asleep. RN reports recent medication administration. Missed 26 minutes of skilled physical therapy.  [phone]) Therapist returned and pt in supine again - wife reports he was able to  tolerate sidelying 15-66minutes before becoming uncomfortable. Pt agreeable to therapy session at this time. Donned shorts in supine with max/total assist for threading over LEs with pt able to initiating bringing legs into hooklying position to bridge slightly and pull pants over hips with total assist. Supine>sitting L EOB with total assist although pt demonstrates improved ability to initiate task via reaching R UE across chest to grab bedrail and roll onto side but continues to require total assist for trunk upright. Performed sitting EOB trunk control/sitting balance tasks with decreased R UE support via reaching within BOS to external targets. Pt demonstrates improving static sitting balance with ability to maintain upright with very slight perturbations from reaching tasks. Progressed to R lateral trunk lean onto R forearm support then returned to midline with total assist in both directions but pt demonstrating initiation of task. Sit>supine with total assist for trunk descent and B LE management up into the bed. With encouragement pt agreeable to R sidelying for pressure relief- therapeutically positioned with pillows for pressure relief. Pt educated on importance of sidelying for pressure relief and healing of sacral wound. Pt left with needs in reach and his wife present.   Session [id number]): Pt received R semi-sidelying with his wife present and pt asleep but awakens to verbal stimulus and agreeable to therapy session. Pt's wife reports he was able to tolerate R sidelying for . Supine>L sidelying with multimodal cuing for increased pt participation with pt able to initiate B LE knee flexion into hooklying position and R UE cross body reach to  roll onto R side with max assist then sidelying>sitting L EOB with total assist for trunk upright. L squat pivot to w/c with total assist of 1 and +2 min assist for safety.  Transported to/from gym in w/c for time management and energy conservation.  Stood in standing frame x4 trials for ~1-1.27minutes each trial targeting B LE weightbearing for tone management and stretching (hip flexors and plantarflexors) as well as B LE NMR and strengthening - pt demonstrates ability to extend L knee today (unable to perform last time doing standing frame with this therapist) and ability to extend and sustain R knee extension during stance as well as improved trunk extension for an upright posture. Transported back to room and performed oral hygiene with total assist. L squat pivot to EOB with total assist of 1 for lifting/pivoting hips. Sit>supine with total assist of 1 person for trunk descent and B LE management into the bed. Pt agreeable to return to R sidelying for pressure relief - therapeutically positioned for pressure relief and left with needs in reach and his wife present. Made up 16 minutes of missed therapy time from earlier session.  Therapy Documentation Precautions:  Precautions Precautions: Fall, Other (comment) Precaution Comments: check BP on left arm due to occlusion on R side. Restrictions Weight Bearing Restrictions: No  Pain: Session 1: Details above.  Session 2: Reports buttocks pain due to pressure wound most pronounced when sitting in TIS w/c - provided repositioning and pressure relief for pain management.    Therapy/Group: Individual Therapy  Tawana Scale, PT, DPT 06/24/2019, 12:33 PM

## 2019-06-24 NOTE — Progress Notes (Signed)
Patient and patient's family expressed desire to take patient home this evening. Concerns regarding care expressed. Spoke with patient, patient's wife, and son regarding possible consequences for removing patient from Rehab tonight. Discussed care plan for tonight and gave options for the morning. Family instructed and agreeable to speaking with MD in the morning regarding plan of care options including patient being either discharged or transferred from facility.

## 2019-06-24 NOTE — Progress Notes (Signed)
Team Conference Report to Patient/Family  Team Conference discussion was reviewed with the patient and wife, including goals, any changes in plan of care and target discharge date.  Patient and caregiver express understanding and are in agreement.  The patient has a target discharge date of 07/09/19.Reviewed HH PT,OT, SLP and RN,  aide and DME recommendations including hospital bed, hoyer lift and tilt-in-space wheelchair. Confirmed need for ramp to the entrance and prep of bedroom for the hospital bed to be delivered to the home. Reviewed application for financial assistance with equipment vendor. MA application initiated with wife and Financial counselor/[employer]s.   Chana Bode B 06/24/2019, 4:21 PM

## 2019-06-24 NOTE — Progress Notes (Signed)
Occupational Therapy Session Note  Patient Details  Name: Adrian [name] MRN: [id number] Date of Birth: 1965/03/28  Today's Date: 06/24/2019 OT Individual Time: 1002-1102 OT Individual Time Calculation (min): 60 min    Short Term Goals: Week 2:  OT Short Term Goal 1 (Week 2): Pt will completed rolling in the bed to assist with LB dressing with max assist. OT Short Term Goal 2 (Week 2): Pt will use the RUE with mod assist to donn his shirt over the left arm during supported UB dressing. OT Short Term Goal 3 (Week 2): Pt will maintain static sitting balance EOM or EOB with min guard assist for up to 2 mins in preparation for selfcare tasks.  Skilled Therapeutic Interventions/Progress Updates:    Pt in bed to start session with family present.  Worked on rolling to the left side with max assist and then transitioning to sitting with total assist.  Once on the EOB he worked on donning his pullover shirt with overall total assist in supported sitting position.  He was able to assist some with donning the shirt over the LUE up to the elbow with mod facilitation.  Max assist needed for threading it up over the RUE with total assist to bring it over his head and pull it down.  He was able to then complete transfer to the tilt in space wheelchair with max assist squat pivot and to the therapy mat at the same level.  Worked on static sitting balance EOM with BUEs in weightbearing.  Increased flexor tone noted in the left elbow with therapist having to provide max assist to keep it in weightbearing beside of him.  He was able to maintain static sitting EOB with max assist overall but demonstrated episode of maintaining his sitting balance for up to 2.5 mins with min guard assist.  Incorporated reaching task with the RUE to pick up 3" foam blocks at knee level and transition them to the mat beside of him as well as to the bedside table in front of him.  He needed increased time and overall min assist to complete  task with the RUE while therapist provided total assist for balance.  Finished session with return to the room and transfer to the bed at total assist level in order for pt to get some relief.  Sitting endurance continues to be challenging secondary to his sacral wound and increased pain.  Pt left in sidelying position with call button in reach and pt's spouse in the room as well.    Therapy Documentation Precautions:  Precautions Precautions: Fall, Other (comment) Precaution Comments: check BP on left arm due to occlusion on R side. Restrictions Weight Bearing Restrictions: No  Pain: Pain Assessment Pain Scale: Faces Faces Pain Scale: Hurts little more Pain Type: Chronic pain Pain Location: Buttocks Pain Orientation: Mid Pain Descriptors / Indicators: Grimacing;Discomfort Pain Onset: On-going Pain Intervention(s): Repositioned Multiple Pain Sites: No ADL: See Care Tool Section for some details of mobility  Therapy/Group: Individual Therapy  Keeshawn Fakhouri OTR/L 06/24/2019, 12:48 PM

## 2019-06-24 NOTE — Progress Notes (Signed)
[facility] PHYSICAL MEDICINE & REHABILITATION PROGRESS NOTE   Subjective/Complaints: Less dysarthric We discussed oral intake.  He is cleared for D1 honey liquids but has had poor intake which is why PEG placement has been recommended.  He stated that he would try to eat more. Fluid intake less than 100 cc/day, last 2 meals have been 40% and 60% No c/o dental pain today  ROS: No chest pain shortness of breath nausea vomiting diarrhea constipation  Objective:   No results found. Recent Labs    06/21/19 1235  WBC 15.3*  HGB 13.4  HCT 39.2  PLT 540*   No results for input(s): NA, K, CL, CO2, GLUCOSE, BUN, CREATININE, CALCIUM in the last 72 hours.  Intake/Output Summary (Last 24 hours) at 06/24/2019 1058 Last data filed at 06/24/2019 0900 Gross per 24 hour  Intake 140 ml  Output 300 ml  Net -160 ml     Physical Exam: Vital Signs Blood pressure (!) 144/95, pulse 85, temperature 97.8 F (36.6 C), temperature source Oral, resp. rate 17, height 5\' 11"  (1.803 m), weight 66.3 kg, SpO2 95 %. HEENT exam shows gingivitis, Left upper molar fracture no buccal [email or web address] tenderness along alveolar ridge    General: No acute distress Mood and affect are appropriate Heart: Regular rate and rhythm no rubs murmurs or extra sounds Lungs: Clear to auscultation, breathing unlabored, no rales or wheezes Abdomen: Positive bowel sounds, soft nontender to palpation, nondistended Extremities: No clubbing, cyanosis, or edema   Skin:see below, has facial dermatitis erythema and flaking improving, Air mattress for skin  Neurologic: Cranial nerves II through XII intact, motor strength is 0/5 in Left  deltoid, bicep, tricep, grip, hip flexor, knee extensors, ankle dorsiflexor and plantar flexor 3- RIgght biceps, triceps, 2- finger flexion and ext, trace R hip knee ext synergy Sensory exam normal sensation to light touch and proprioception in bilateral upper and lower extremities Cerebellar exam NA  d/t weakness  Musculoskeletal:  No joint swelling Dysarthria moderate     Assessment/Plan: 1. 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  Physiatrist is providing close team supervision and 24 hour management of active medical problems listed below.  Physiatrist and rehab team continue to assess barriers to discharge/monitor patient progress toward functional and medical goals  Care Tool:  Bathing        Body parts bathed by helper: Right arm, Left arm, Chest, Abdomen, Front perineal area, Buttocks, Right upper leg, Left upper leg, Right lower leg, Left lower leg, Face     Bathing assist Assist Level: Dependent - Patient 0%     Upper Body Dressing/Undressing Upper body dressing   What is the patient wearing?: Pull over shirt    Upper body assist Assist Level: 2 Helpers(sitting unsupported EOB)    Lower Body Dressing/Undressing Lower body dressing      What is the patient wearing?: Pants     Lower body assist Assist for lower body dressing: Dependent - Patient 0%     Toileting Toileting    Toileting assist Assist for toileting: 2 Helpers     Transfers Chair/bed transfer  Transfers assist  Chair/bed transfer activity did not occur: Safety/medical concerns  Chair/bed transfer assist level: 2 Helpers(2 helpers bed > w/c, total assist SPT w/c > bed.)     Locomotion Ambulation   Ambulation assist   Ambulation activity did not occur: Safety/medical concerns  Walk 10 feet activity   Assist  Walk 10 feet activity did not occur: Safety/medical concerns        Walk 50 feet activity   Assist Walk 50 feet with 2 turns activity did not occur: Safety/medical concerns         Walk 150 feet activity   Assist Walk 150 feet activity did not occur: Safety/medical concerns         Walk 10 feet on  uneven surface  activity   Assist Walk 10 feet on uneven surfaces activity did not occur: Safety/medical concerns         Wheelchair     Assist     Wheelchair activity did not occur: Safety/medical concerns         Wheelchair 50 feet with 2 turns activity    Assist    Wheelchair 50 feet with 2 turns activity did not occur: Safety/medical concerns       Wheelchair 150 feet activity     Assist  Wheelchair 150 feet activity did not occur: Safety/medical concerns       Blood pressure (!) 144/95, pulse 85, temperature 97.8 F (36.6 C), temperature source Oral, resp. rate 17, height 5\' 11"  (1.803 m), weight 66.3 kg, SpO2 95 %.  Medical Problem List and Plan: 1.Dysarthria/quadriparesis/aphasiasecondary to multifocal infarcts mostly posterior circulation but also bilateral anterior circulation cardioembolic pattern as well as innominate artery occlusion, left ICA thrombus versus soft plaque, right V4 and basal artery occlusive thrombus  Continue CIR, PT,  OT,  Continue efforts at increasing oral intake 2. Antithrombotics: -DVT/anticoagulation:Lovenox  Creatinine within normal limits on 5/1 -antiplatelet therapy: Aspirin 325 mg and Plavix 75 mg daily- will not hold plavix until IR indicates PEG is scheduled  3. Pain Management:Tramadol as needed  Lumbar pain (location difficult to assess due to communication deficits)-  Xray unremarkable , technically suboptimal d/t contrast in colon this applies only to lateral images, AP looks OK , likely bedrest is causing increased pain.  According to family he has had chronic low back pain as well.       dental pain ? improved- left upper molar ,  No sign of abscess, + gingivitis, unable to do orthopantogram because of poor sitting balance , ask dental to eval  4. Mood:Wellbutrin 150 mg twice daily, may need trial of Nuedexta, expect PBA given extensive posterior > anterior circulation  infarcts -antipsychotic agents: Seroquel 25 mg 3 times daily as needed 5. Neuropsych: This patientis notcapable of making decisions on hisown behalf.  6. Skin/Wound Care:Routine skin checks- sacrococcygeal eschar, MASD, cont foam with collagenase on eschar , if no better in a week may need debridement Seborrheic dermatitis - start valisone  7. Fluids/Electrolytes/Nutrition:Routine in and outs  BMP within acceptable range on 4/26 8. Post stroke dysphagia. Dysphagia #1 honey thick liquids, advance diet as tolerated.    Consulted IR for PEG placement -national shortage of PEG tubes awaiting shipment Continue IVF, getting better with meal intake however fluid intake still very poor 9. Orthostatic hypotension. Monitor with increased mobility Vitals:   06/23/19 2036 06/24/19 0529  BP: 139/78 (!) 144/95  Pulse: 79 85  Resp: 16 17  Temp: 98.4 F (36.9 C) 97.8 F (36.6 C)  SpO2: 99% 95%    Controlled 5/5 10. Hyperlipidemia. Lipitor 11. Tobacco abuse. NicoDerm patch.  Counseled when possible 12. Leukocytosis. -Per CT abd  RLL atelectasis vs PNA, given pt afebrile , WBCs elevated but stable     Continue to monitor  LOS: 12 days A FACE TO FACE EVALUATION WAS PERFORMED  Erick Colace 06/24/2019, 10:58 AM

## 2019-06-24 NOTE — Plan of Care (Signed)
  Problem: Consults Goal: RH STROKE PATIENT EDUCATION Description: See Patient Education module for education specifics  Outcome: Progressing   Problem: RH BOWEL ELIMINATION Goal: RH STG MANAGE BOWEL WITH ASSISTANCE Description: STG Manage Bowel with mod Assistance. Outcome: Progressing Goal: RH STG MANAGE BOWEL W/MEDICATION W/ASSISTANCE Description: STG Manage Bowel with Medication with mod Assistance. Outcome: Progressing   Problem: RH BLADDER ELIMINATION Goal: RH STG MANAGE BLADDER WITH ASSISTANCE Description: STG Manage Bladder With mod Assistance Outcome: Progressing   Problem: RH SKIN INTEGRITY Goal: RH STG MAINTAIN SKIN INTEGRITY WITH ASSISTANCE Description: STG Maintain Skin Integrity With mod Assistance. Outcome: Progressing Goal: RH STG ABLE TO PERFORM INCISION/WOUND CARE W/ASSISTANCE Description: STG Able To Perform Incision/Wound Care With max Assistance. Outcome: Progressing   Problem: RH SAFETY Goal: RH STG ADHERE TO SAFETY PRECAUTIONS W/ASSISTANCE/DEVICE Description: STG Adhere to Safety Precautions With min Assistance/Device. Outcome: Progressing   Problem: RH COGNITION-NURSING Goal: RH STG USES MEMORY AIDS/STRATEGIES W/ASSIST TO PROBLEM SOLVE Description: STG Uses Memory Aids/Strategies With min Assistance to Problem Solve. Outcome: Progressing Goal: RH STG ANTICIPATES NEEDS/CALLS FOR ASSIST W/ASSIST/CUES Description: STG Anticipates Needs/Calls for Assist With min Assistance/Cues. Outcome: Progressing   Problem: RH PAIN MANAGEMENT Goal: RH STG PAIN MANAGED AT OR BELOW PT'S PAIN GOAL Description: Less than 3 out of 10 Outcome: Progressing   Problem: RH KNOWLEDGE DEFICIT Goal: RH STG INCREASE KNOWLEDGE OF HYPERTENSION Description: Patient/family will be able to identify medication to treat HTN along with 2 other nonpharmalogical interventions Outcome: Progressing Goal: RH STG INCREASE KNOWLEDGE OF DYSPHAGIA/FLUID INTAKE Description: Patient will be  able to demonstrate understanding of thickened fluid necessity Outcome: Progressing Goal: RH STG INCREASE KNOWLEDGE OF STROKE PROPHYLAXIS Description: Patient/Family will be able to verbalize to 3 medications for stroke prevention post discharge  Outcome: Progressing   

## 2019-06-24 NOTE — Progress Notes (Signed)
Speech Language Pathology Daily Session Note  Patient Details  Name: Adrian [name] MRN: [id number] Date of Birth: [birth year]  Today's Date: 06/24/2019 SLP Individual Time: 0830-0902 SLP Individual Time Calculation (min): 32 min  Short Term Goals: Week 2: SLP Short Term Goal 1 (Week 2): Pt will consume trials of dysphagia 2 solids with efficient mastication and oral clearance X4 with Mod A cues for use of swallow strategies. SLP Short Term Goal 2 (Week 2): Pt will consume therapeutic trials of ice and/or thin H2O with adequate oral containment and minimal overt s/sx aspiration with Max A multimodal cues for accurate use of chin tuck maneuver. SLP Short Term Goal 3 (Week 2): Pt will verbalize at the word level to achieve 30% intelligibility with Max A multimodal cueing for intelligibility strategies. SLP Short Term Goal 4 (Week 2): Pt communicate basic wants and needs via multimodal means with Mod A cues. SLP Short Term Goal 5 (Week 2): Pt will sustain attention to functional tasks for 5-7 minutes with Max A multimodal cues. SLP Short Term Goal 6 (Week 2): Pt will perform 25 reps of EMST exercises to increase breath support and voice with Mod A multimodal cues for accuracy.  Skilled Therapeutic Interventions: Pt was seen for skilled ST targeting dysphagia and speech. Pt with brighter affect today, laughing and making jokes about his bed throughout session. He was eager to have assistance eating his dysphagia 1 (puree) breakfast. Pt consumed whole bowl of cream of wheat with minimal lingual residue noted, but overall more timely AP transit of solid boluses in comparison to previous visits. Lingual residue successfully cleared with teaspoons of honey thick juice. He exhibited 1 delayed cough following tsp honey thick juice. Pt's intelligibility improved to ~40% at word and short phrase level today (to this relatively familiar listener) when provided Max A cues to implement slower rate to express wants and  needs throughout session. Pt expressed awareness of anticipated d/c date and ability to accurately verbally problem solve the number of days until he could go home independently. Pt left laying in bed with alarm set and needs within reach. NT aware pt would like to finish eating pineapple from breakfast tray. Continue per current plan of care.        Pain Pain Assessment Pain Scale: Faces Pain Score: 0-No pain Faces Pain Scale: Hurts a little bit Pain Type: Acute pain Pain Location: Buttocks Pain Descriptors / Indicators: Discomfort Pain Onset: On-going Pain Intervention(s): Repositioned;Emotional support Multiple Pain Sites: No  Therapy/Group: Individual Therapy  [name] 06/24/2019, 7:08 AM

## 2019-06-25 ENCOUNTER — Inpatient Hospital Stay (HOSPITAL_COMMUNITY): Payer: Self-pay | Admitting: Physical Therapy

## 2019-06-25 ENCOUNTER — Inpatient Hospital Stay (HOSPITAL_COMMUNITY): Payer: Self-pay | Admitting: Speech Pathology

## 2019-06-25 ENCOUNTER — Inpatient Hospital Stay (HOSPITAL_COMMUNITY): Payer: Self-pay | Admitting: Occupational Therapy

## 2019-06-25 ENCOUNTER — Inpatient Hospital Stay (HOSPITAL_COMMUNITY): Payer: Self-pay

## 2019-06-25 MED ORDER — COLLAGENASE 250 UNIT/GM EX OINT
TOPICAL_OINTMENT | Freq: Every day | CUTANEOUS | Status: DC
  Filled 2019-06-25 (×2): qty 30

## 2019-06-25 NOTE — Progress Notes (Signed)
[facility] PHYSICAL MEDICINE & REHABILITATION PROGRESS NOTE   Subjective/Complaints:  Family at bedside.  No issues this morning.  Wife raise concerns about a choking episode last night and stated that she was not able to get proper nursing assistance.  Discussed with nurse manager and there was reportedly nurse assessment but no choking was appreciated and there was mainly a repositioning request  Reviewed intake this appears to be improving for the last 2 meals.  Fluid intake remains low ROS: No chest pain shortness of breath nausea vomiting diarrhea constipation  Objective:   No results found. No results for input(s): WBC, HGB, HCT, PLT in the last 72 hours. No results for input(s): NA, K, CL, CO2, GLUCOSE, BUN, CREATININE, CALCIUM in the last 72 hours.  Intake/Output Summary (Last 24 hours) at 06/25/2019 1328 Last data filed at 06/25/2019 1030 Gross per 24 hour  Intake 120 ml  Output --  Net 120 ml     Physical Exam: Vital Signs Blood pressure (!) 140/58, pulse 83, temperature 98.1 F (36.7 C), temperature source Oral, resp. rate 17, height 5\' 11"  (1.803 m), weight 66.3 kg, SpO2 97 %. HEENT exam shows gingivitis, Left upper molar fracture no buccal tenderness along alveolar ridge     General: No acute distress Mood and affect are appropriate Heart: Regular rate and rhythm no rubs murmurs or extra sounds Lungs: Clear to auscultation, breathing unlabored, no rales or wheezes Abdomen: Positive bowel sounds, soft nontender to palpation, nondistended Extremities: No clubbing, cyanosis, or edema Skin: No evidence of breakdown, no evidence of rash   Skin:see below, has facial dermatitis erythema and flaking improving,   Neurologic: Cranial nerves II through XII intact, motor strength is 0/5 in Left  deltoid, bicep, tricep, grip, hip flexor, knee extensors, ankle dorsiflexor and plantar flexor 3- RIgght biceps, triceps, 2- finger flexion and ext, trace R hip knee  ext synergy Sensory exam normal sensation to light touch and proprioception in bilateral upper and lower extremities Cerebellar exam NA d/t weakness  Musculoskeletal:  No joint swelling Dysarthria moderate     Assessment/Plan: 1. 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  Physiatrist is providing close team supervision and 24 hour management of active medical problems listed below.  Physiatrist and rehab team continue to assess barriers to discharge/monitor patient progress toward functional and medical goals  Care Tool:  Bathing        Body parts bathed by helper: Right arm, Left arm, Chest, Abdomen, Front perineal area, Buttocks, Right upper leg, Left upper leg, Right lower leg, Left lower leg, Face     Bathing assist Assist Level: Dependent - Patient 0%     Upper Body Dressing/Undressing Upper body dressing   What is the patient wearing?: Pull over shirt    Upper body assist Assist Level: 2 Helpers(sitting unsupported EOB)    Lower Body Dressing/Undressing Lower body dressing      What is the patient wearing?: Pants     Lower body assist Assist for lower body dressing: Dependent - Patient 0%     Toileting Toileting    Toileting assist Assist for toileting: 2 Helpers     Transfers Chair/bed transfer  Transfers assist  Chair/bed transfer activity did not occur: Safety/medical concerns  Chair/bed transfer assist level: Total Assistance - Patient < 25%     Locomotion Ambulation   Ambulation assist   Ambulation  activity did not occur: Safety/medical concerns          Walk 10 feet activity   Assist  Walk 10 feet activity did not occur: Safety/medical concerns        Walk 50 feet activity   Assist Walk 50 feet with 2 turns activity did not occur: Safety/medical concerns         Walk 150 feet  activity   Assist Walk 150 feet activity did not occur: Safety/medical concerns         Walk 10 feet on uneven surface  activity   Assist Walk 10 feet on uneven surfaces activity did not occur: Safety/medical concerns         Wheelchair     Assist     Wheelchair activity did not occur: Safety/medical concerns         Wheelchair 50 feet with 2 turns activity    Assist    Wheelchair 50 feet with 2 turns activity did not occur: Safety/medical concerns       Wheelchair 150 feet activity     Assist  Wheelchair 150 feet activity did not occur: Safety/medical concerns       Blood pressure (!) 140/58, pulse 83, temperature 98.1 F (36.7 C), temperature source Oral, resp. rate 17, height 5\' 11"  (1.803 m), weight 66.3 kg, SpO2 97 %.  Medical Problem List and Plan: 1.Dysarthria/quadriparesis/aphasiasecondary to multifocal infarcts mostly posterior circulation but also bilateral anterior circulation cardioembolic pattern as well as innominate artery occlusion, left ICA thrombus versus soft plaque, right V4 and basal artery occlusive thrombus  Continue CIR, PT,  OT,  Continue efforts at increasing oral intake, doing better with caloric intake but still very low on fluid intake and requiring IV  2. Antithrombotics: -DVT/anticoagulation:Lovenox  Creatinine within normal limits on 5/1 -antiplatelet therapy: Aspirin 325 mg and Plavix 75 mg daily- will not hold plavix until IR indicates PEG is scheduled  3. Pain Management:Tramadol as needed  Lumbar pain (location difficult to assess due to communication deficits)-  Xray unremarkable , technically suboptimal d/t contrast in colon this applies only to lateral images, AP looks OK , likely bedrest is causing increased pain.  According to family he has had chronic low back pain as well.       dental pain ? improved- left upper molar ,  No sign of abscess, + gingivitis, unable to do orthopantogram because  of poor sitting balance , ask dental to eval  4. Mood:Wellbutrin 150 mg twice daily, may need trial of Nuedexta, expect PBA given extensive posterior > anterior circulation infarcts -antipsychotic agents: Seroquel 25 mg 3 times daily as needed 5. Neuropsych: This patientis notcapable of making decisions on hisown behalf.  6. Skin/Wound Care:Routine skin checks- sacrococcygeal eschar, MASD, cont foam with collagenase on eschar , if no better in a week may need debridement Seborrheic dermatitis - start valisone  7. Fluids/Electrolytes/Nutrition:Routine in and outs  BMP within acceptable range on 4/26 8. Post stroke dysphagia. Dysphagia #1 honey thick liquids, advance diet as tolerated.    Consulted IR for PEG placement -national shortage of PEG tubes awaiting shipment Continue IVF, getting better with meal intake however fluid intake still very poor 9. Orthostatic hypotension. Monitor with increased mobility Vitals:   06/24/19 2017 06/25/19 0347  BP: (!) 136/94 (!) 140/58  Pulse: 74 83  Resp: 17 17  Temp: 97.8 F (36.6 C) 98.1 F (36.7 C)  SpO2: 92% 97%    Controlled 5/6 10. Hyperlipidemia. Lipitor 11. Tobacco  abuse. NicoDerm patch.  Counseled when possible 12. Leukocytosis. -Per CT abd  RLL atelectasis vs PNA, given pt afebrile , WBCs elevated but stable     Continue to monitor for temp and for productive cough  LOS: 13 days A FACE TO [place] [address] 06/25/2019, 1:28 PM

## 2019-06-25 NOTE — Progress Notes (Signed)
Physical Therapy Session Note  Patient Details  Name: Adrian [name] MRN: [id number] Date of Birth: 08/09/1965  Today's Date: 06/25/2019 PT Individual Time: 0906-1004 PT Individual Time Calculation (min): 58 min   Short Term Goals: Week 2:  PT Short Term Goal 1 (Week 2): Pt will roll R/L with mod asssist of 1 PT Short Term Goal 2 (Week 2): Pt will perform supine<>sit with max assist of 1 PT Short Term Goal 3 (Week 2): Pt will perform bed<>chair transfers with total assist of 1 PT Short Term Goal 4 (Week 2): Pt will tolerate x 5 minutes in the standing frame for LE weightbearing and standing tolerance, dependent assist. PT Short Term Goal 5 (Week 2): Pt will tolerate sitting in TIS w/c for 1 hour outside of therapy session  Skilled Therapeutic Interventions/Progress Updates:   Pt received supine in bed with his wife and son present and pt requiring encouragement to participate in therapy session at this time due to increased buttocks pain from pressure sores but agreeable. Donned shorts with total assist for threading LEs and placing B LEs into hooklying for pt to bridge and slightly clear hips to initiate pulling shorts over hips. Supine>L sidelying with max/total assist for B LE management and rolling with pt continuing to perform cross body reaching with R UE to assist with roll. L sidelying>sitting EOB with total assist for trunk upright. R squat pivot to w/c with total assist for lifting and pivoting hips. Pt able to extend R LE within 90% of placing on leg rest and L LE within 75% of placing on leg rests without assist. Transported to/from gym in w/c for time management and energy conservation. Sit<>stands in standing frame x1 with sling support but then pt reporting increased buttocks pain therefore removed sling and with encouragement pt agreeable to continue attempting standing. Performed 2 stands in standing frame without sling support with +2 assist for trunk control and lifting hips -  continues to tolerate standing ~1.57minutes each bout. Progressed to sit<>stands w/c<>3 [place]eer support x2trials with +2 max assist for lifting into standing and balance - therapist facilitating increased L LE hip/knee extension and pt able to progress to a slight step forward/back with R LE but unable to perform with L LE. Transported back to room. L squat pivot to EOB with total assist for lifting/pivoting hips but pt demonstrating improving ability to initiate forward trunk flexion to perform transfer. Sit>supine with +2 total assist to decrease shearing forces on sacrum. Pt left supine in bed with wife and son present - pt remained in supine at this time to eat breakfast.   Therapy Documentation Precautions:  Precautions Precautions: Fall, Other (comment) Precaution Comments: check BP on left arm due to occlusion on R side. Restrictions Weight Bearing Restrictions: No  Pain: Reports significant buttocks pain due to pressure sore - RN notified for medication administration - provided repositioning, emotional support, distraction, and pressure relief for pain management.    Therapy/Group: Individual Therapy  Ginny Forth, PT, DPT 06/25/2019, 7:52 AM

## 2019-06-25 NOTE — Progress Notes (Signed)
Pt refusing his ortho boots. Pt refusing to be turned on side, stated mattress won't allow him to stay on side. Pt was educated to benefits of being repositioned.  Pt did allow staff to place him on his side with pillows positioned  to keep him in place.

## 2019-06-25 NOTE — Progress Notes (Signed)
Occupational Therapy Session Note  Patient Details  Name: Adrian [name] MRN: [id number] Date of Birth: [birth year]  Today's Date: 06/25/2019 OT Individual Time: 1115-1200 OT Individual Time Calculation (min): 45 min    Short Term Goals: Week 2:  OT Short Term Goal 1 (Week 2): Pt will completed rolling in the bed to assist with LB dressing with max assist. OT Short Term Goal 2 (Week 2): Pt will use the RUE with mod assist to donn his shirt over the left arm during supported UB dressing. OT Short Term Goal 3 (Week 2): Pt will maintain static sitting balance EOM or EOB with min guard assist for up to 2 mins in preparation for selfcare tasks.  Skilled Therapeutic Interventions/Progress Updates:  Pt received supine in bed with wife and son present agreeable to OT session. Pt request to work on standing in standing frame. Pt required total A to roll to L side and elevate trunk into sitting. Total A +1 to complete a squat pivot transfer to pts R side to TIS. Pt transported to therapy gym with total A for time mgmt. Pt complete sit<>stand in standing frame with total A. Pt able to complete dynamic reaching activities with BUEs to tap therapists hand R<>L. Pt able to come out of squat position into full stand x2 with CGA and  MIN cues for body mechanics. Pt able to stand a total of 8 min and 30 seconds. Pt transported back to room in TIS with total A. Pt complete squat pivot transfer back to bed to pts L side with total A +1. Total A +2 to return to supine. Placed wedge with pt rolled to R side at 12:00 pm to facilitate pressure relief- RN aware. Pt left with family present and all needs within reach.   Therapy Documentation Precautions:  Precautions Precautions: Fall, Other (comment) Precaution Comments: check BP on left arm due to occlusion on R side. Restrictions Weight Bearing Restrictions: No General:   Vital Signs:  Pain: Pt reports unrated pain in buttocks; utilized repositioning as pain mgmt  strategy.   Therapy/Group: Individual Therapy  Angelina Pih 06/25/2019, 12:19 PM

## 2019-06-25 NOTE — Progress Notes (Signed)
Speech Language Pathology Daily Session Note  Patient Details  Name: Adrian [name] MRN: [id number] Date of Birth: January 20, 1966  Today's Date: 06/25/2019 SLP Individual Time: 3818-2993 SLP Individual Time Calculation (min): 30 min  Short Term Goals: Week 2: SLP Short Term Goal 1 (Week 2): Pt will consume trials of dysphagia 2 solids with efficient mastication and oral clearance X4 with Mod A cues for use of swallow strategies. SLP Short Term Goal 2 (Week 2): Pt will consume therapeutic trials of ice and/or thin H2O with adequate oral containment and minimal overt s/sx aspiration with Max A multimodal cues for accurate use of chin tuck maneuver. SLP Short Term Goal 3 (Week 2): Pt will verbalize at the word level to achieve 30% intelligibility with Max A multimodal cueing for intelligibility strategies. SLP Short Term Goal 4 (Week 2): Pt communicate basic wants and needs via multimodal means with Mod A cues. SLP Short Term Goal 5 (Week 2): Pt will sustain attention to functional tasks for 5-7 minutes with Max A multimodal cues. SLP Short Term Goal 6 (Week 2): Pt will perform 25 reps of EMST exercises to increase breath support and voice with Mod A multimodal cues for accuracy.  Skilled Therapeutic Interventions:  Pt was seen for skilled ST targeting dysphagia goals.  Therapist facilitated the session with therapeutic trials of dys 2 textures to continue working towards diet progression.  Pt's mastication was weak and manipulation of solid boluses was minimal resulting in a prolonged oral phase.  No overt s/s of aspiration were evident with advanced textures; however, PO intake was limited due to tooth pain/sensitivity to cold temperatures and sweet flavors.  SLP also offered pt trials of small amounts of thin liquids via spoon; however, he declined as he wanted to lay back in bed to remove pressure from his buttocks after sitting up in bed for PO trials.  Pt was left in bed with family at bedside.   Continue per current plan of care.     Pain Pain Assessment Pain Scale: 0-10 Pain Score: 0-No pain   Therapy/Group: Individual Therapy  Nataley Bahri, Melanee Spry 06/25/2019, 3:40 PM

## 2019-06-25 NOTE — Plan of Care (Signed)
  Problem: Consults Goal: RH STROKE PATIENT EDUCATION Description: See Patient Education module for education specifics  Outcome: Progressing   Problem: RH BOWEL ELIMINATION Goal: RH STG MANAGE BOWEL WITH ASSISTANCE Description: STG Manage Bowel with mod Assistance. Outcome: Progressing Goal: RH STG MANAGE BOWEL W/MEDICATION W/ASSISTANCE Description: STG Manage Bowel with Medication with mod Assistance. Outcome: Progressing   Problem: RH BLADDER ELIMINATION Goal: RH STG MANAGE BLADDER WITH ASSISTANCE Description: STG Manage Bladder With mod Assistance Outcome: Progressing   Problem: RH SKIN INTEGRITY Goal: RH STG SKIN FREE OF INFECTION/BREAKDOWN Outcome: Progressing Goal: RH STG MAINTAIN SKIN INTEGRITY WITH ASSISTANCE Description: STG Maintain Skin Integrity With mod Assistance. Outcome: Progressing Goal: RH STG ABLE TO PERFORM INCISION/WOUND CARE W/ASSISTANCE Description: STG Able To Perform Incision/Wound Care With max Assistance. Outcome: Progressing   Problem: RH SAFETY Goal: RH STG ADHERE TO SAFETY PRECAUTIONS W/ASSISTANCE/DEVICE Description: STG Adhere to Safety Precautions With min Assistance/Device. Outcome: Progressing   Problem: RH COGNITION-NURSING Goal: RH STG USES MEMORY AIDS/STRATEGIES W/ASSIST TO PROBLEM SOLVE Description: STG Uses Memory Aids/Strategies With min Assistance to Problem Solve. Outcome: Progressing Goal: RH STG ANTICIPATES NEEDS/CALLS FOR ASSIST W/ASSIST/CUES Description: STG Anticipates Needs/Calls for Assist With min Assistance/Cues. Outcome: Progressing   Problem: RH KNOWLEDGE DEFICIT Goal: RH STG INCREASE KNOWLEDGE OF HYPERTENSION Description: Patient/family will be able to identify medication to treat HTN along with 2 other nonpharmalogical interventions Outcome: Progressing Goal: RH STG INCREASE KNOWLEDGE OF DYSPHAGIA/FLUID INTAKE Description: Patient will be able to demonstrate understanding of thickened fluid necessity Outcome:  Progressing Goal: RH STG INCREASE KNOWLEGDE OF HYPERLIPIDEMIA Outcome: Progressing Goal: RH STG INCREASE KNOWLEDGE OF STROKE PROPHYLAXIS Description: Patient/Family will be able to verbalize to 3 medications for stroke prevention post discharge  Outcome: Progressing

## 2019-06-25 NOTE — Progress Notes (Signed)
Occupational Therapy Session Note  Patient Details  Name: Adrian [name] MRN: [id number] Date of Birth: [birth year]  Today's Date: 06/25/2019 OT Individual Time: 1301-1415 OT Individual Time Calculation (min): 74 min    Short Term Goals: Week 2:  OT Short Term Goal 1 (Week 2): Pt will completed rolling in the bed to assist with LB dressing with max assist. OT Short Term Goal 2 (Week 2): Pt will use the RUE with mod assist to donn his shirt over the left arm during supported UB dressing. OT Short Term Goal 3 (Week 2): Pt will maintain static sitting balance EOM or EOB with min guard assist for up to 2 mins in preparation for selfcare tasks.  Skilled Therapeutic Interventions/Progress Updates:    Pt in bed to start session with family present.  He was able to assist with rolling to the left side with total assist and then to sitting at the same level.  He completed squat pivot transfer to the wheelchair with total assist as well.  Therapist able to notice pt pushing more with his LEs this session during transfer than during previous sessions.  He was rolled to the therapy gym after therapist assisted with donning his sneakers, and he then transferred to the therapy mat at the same level of total assist.  Worked on sit to stand from the mat with three muskateers technique total assist +2 (pt 20%).  He then completed functional mobility using the same technique for 2 intervals of 9 ft.  He needed assist with advancement of both LEs with decreased ability to clear his foot from the floor on either side.  Increased knee flexion noted bilaterally in stance phase as well.  Transitioned back to the therapy mat for further work on sitting balance and RUE functional reach.  His LUE was placed in weightbearing on the therapy mat while working on reaching foam blocks with the LUE and overall min assist.  He demonstrated decreased efficiency with opening the hand far enough to place it around the block at times as well  as when trying to let it go.  Increased flexor tone still noted in the left elbow.  Placed both UEs in weightbearing as well for increased use while having pt complete small left to right weightshifts.  He was able to use the RUE on dycem to help correct LOB to the right with overall mod assist.  He still needs max assist to correct LOB to the left with integrated use of the LUE.  Finished session with transfer back to the wheelchair and then back to the bed at total assist squat pivot.  Pt left in the room with family present and pt positioned on his side for pressure relief.  Call button in reach as well.    Therapy Documentation Precautions:  Precautions Precautions: Fall, Other (comment) Precaution Comments: check BP on left arm due to occlusion on R side. Restrictions Weight Bearing Restrictions: No  Pain: Pain Assessment Pain Scale: 0-10 Pain Score: 0-No pain Faces Pain Scale: Hurts little more Pain Type: Acute pain Pain Location: Buttocks Pain Orientation: Mid Pain Descriptors / Indicators: Discomfort Pain Frequency: Intermittent Pain Onset: Sudden Patients Stated Pain Goal: 2 Multiple Pain Sites: No ADL: See Care Tool Section for some details of mobility and selfcare  Therapy/Group: Individual Therapy  Audrena Talaga OTR/L 06/25/2019, 3:47 PM

## 2019-06-26 ENCOUNTER — Inpatient Hospital Stay (HOSPITAL_COMMUNITY): Payer: Self-pay

## 2019-06-26 LAB — CREATININE, SERUM
Creatinine, Ser: 0.7 mg/dL (ref 0.61–1.24)
GFR calc Af Amer: >60 mL/min (ref >60)
GFR calc non Af Amer: >60 mL/min (ref >60)

## 2019-06-26 NOTE — Progress Notes (Signed)
Pt refused to be positioned on either side using the wedge. Risk/Benefits was explained to the pt to help the buttocks wounds heal, and pt continued to refuse.

## 2019-06-26 NOTE — Plan of Care (Signed)
  Problem: Consults Goal: RH STROKE PATIENT EDUCATION Description: See Patient Education module for education specifics  Outcome: Progressing   Problem: RH BOWEL ELIMINATION Goal: RH STG MANAGE BOWEL WITH ASSISTANCE Description: STG Manage Bowel with mod Assistance. Outcome: Progressing Goal: RH STG MANAGE BOWEL W/MEDICATION W/ASSISTANCE Description: STG Manage Bowel with Medication with mod Assistance. Outcome: Progressing   Problem: RH BLADDER ELIMINATION Goal: RH STG MANAGE BLADDER WITH ASSISTANCE Description: STG Manage Bladder With mod Assistance Outcome: Progressing   Problem: RH SKIN INTEGRITY Goal: RH STG SKIN FREE OF INFECTION/BREAKDOWN Outcome: Progressing Goal: RH STG MAINTAIN SKIN INTEGRITY WITH ASSISTANCE Description: STG Maintain Skin Integrity With mod Assistance. Outcome: Progressing Goal: RH STG ABLE TO PERFORM INCISION/WOUND CARE W/ASSISTANCE Description: STG Able To Perform Incision/Wound Care With max Assistance. Outcome: Progressing   Problem: RH SAFETY Goal: RH STG ADHERE TO SAFETY PRECAUTIONS W/ASSISTANCE/DEVICE Description: STG Adhere to Safety Precautions With min Assistance/Device. Outcome: Progressing   Problem: RH COGNITION-NURSING Goal: RH STG USES MEMORY AIDS/STRATEGIES W/ASSIST TO PROBLEM SOLVE Description: STG Uses Memory Aids/Strategies With min Assistance to Problem Solve. Outcome: Progressing Goal: RH STG ANTICIPATES NEEDS/CALLS FOR ASSIST W/ASSIST/CUES Description: STG Anticipates Needs/Calls for Assist With min Assistance/Cues. Outcome: Progressing   Problem: RH PAIN MANAGEMENT Goal: RH STG PAIN MANAGED AT OR BELOW PT'S PAIN GOAL Description: Less than 3 out of 10 Outcome: Progressing   Problem: RH KNOWLEDGE DEFICIT Goal: RH STG INCREASE KNOWLEDGE OF HYPERTENSION Description: Patient/family will be able to identify medication to treat HTN along with 2 other nonpharmalogical interventions Outcome: Progressing Goal: RH STG INCREASE  KNOWLEDGE OF DYSPHAGIA/FLUID INTAKE Description: Patient will be able to demonstrate understanding of thickened fluid necessity Outcome: Progressing Goal: RH STG INCREASE KNOWLEGDE OF HYPERLIPIDEMIA Outcome: Progressing Goal: RH STG INCREASE KNOWLEDGE OF STROKE PROPHYLAXIS Description: Patient/Family will be able to verbalize to 3 medications for stroke prevention post discharge  Outcome: Progressing   

## 2019-06-26 NOTE — Progress Notes (Signed)
Pt agreed to letting this nurse position him on the right side using the wedge.

## 2019-06-26 NOTE — Progress Notes (Signed)
Physical Therapy Session Note  Patient Details  Name: Adrian [name] MRN: [id number] Date of Birth: 01-Apr-1965  Today's Date: 06/26/2019 PT Individual Time: 2876-8115 PT Individual Time Calculation (min): 28 min   Short Term Goals: Week 2:  PT Short Term Goal 1 (Week 2): Pt will roll R/L with mod asssist of 1 PT Short Term Goal 2 (Week 2): Pt will perform supine<>sit with max assist of 1 PT Short Term Goal 3 (Week 2): Pt will perform bed<>chair transfers with total assist of 1 PT Short Term Goal 4 (Week 2): Pt will tolerate x 5 minutes in the standing frame for LE weightbearing and standing tolerance, dependent assist. PT Short Term Goal 5 (Week 2): Pt will tolerate sitting in TIS w/c for 1 hour outside of therapy session  Skilled Therapeutic Interventions/Progress Updates:   Pt received supine in bed with his son present and pt agreeable to therapy session. Pt's son reports he has been assisting him with sidelying for pressure relief today. Pt reports he wants to work on standing. Donned shorts with total assist and total assist for rolling L/R - pt continuing to demonstrate increased participation with ability to initiate flexing LE into hooklying and reaching across body with R UE to assist with rolling. L sidelying>sitting L EOB with total assist for bringing trunk upright and max assist for B LE management off EOB with pt demonstrating activation in LEs to assist with this. Sit<>stand EOB<>L UE support around therapist and R HHA x2 with max assist for lifting - pt able to activate B LE quads (R>L) with therapist guarding L LE (also demonstrates hypertonicity in  BLEs that assists with standing) - tolerates standing ~62minutes each. Attempts pre-gait training R LE stepping forward/backwards but due to increased flexor tone pt unable to step - attempted L LE stepping forwards/backwards with pt able to perform very small step but flexor tone limiting movement. Stand pivot to TIS w/c with +2 total  assist for pivoting hips to perform pressure relief. Stand pivot back to EOB with total assist for lifting and pivoting hips. Sit>L sidelying with total assist for trunk descent and B LE management into the bed. Pt left L sidelying with needs in reach and family present.  Therapy Documentation Precautions:  Precautions Precautions: Fall, Other (comment) Precaution Comments: check BP on left arm due to occlusion on R side. Restrictions Weight Bearing Restrictions: No  Pain: Continues to report significant pain at sacral pressure wound site - repositioned for pressure relief and modified interventions to avoid shearing forces.   Therapy/Group: Individual Therapy  Ginny Forth, PT, DPT  06/26/2019, 3:50 PM

## 2019-06-26 NOTE — Progress Notes (Signed)
This writer walked in to shift report, and was met by the wife of pt stating that he was in choking and she needed help. Pt wife states that she had called six times and that no one was coming. When this writer walked into the room pt was not choking and no distress noted. Pt was straight up in bed . Pt wanted to sit on the side of the bed and wanted the wife to help me. This Probation officer educated pt and told him that it was unsafe for this nurse  to sit him up due to not knowing his safety plan and how pt transfers, also that the wife could not help me unless she was checked off. Told pt that this writer would let his nurse know that he wanted to sit up on the side of the bed.  Pt very upset, and wife stated that everyone is standing at the nurses station not helping. This Probation officer stated that other nurses had just started shift report.  As this writer was standing about to get shift report , pt son comes up to front desk yelling " yall need to fucking do your job and help my dad." Wife proceeded to approach the nurses station stating "get his paper work, im taking him out of here since yall dont know how to take care of him"  Security was called at that point.This Probation officer reported off to both day shift and night shift charge nurses and the nurse in charge of the room.

## 2019-06-26 NOTE — Progress Notes (Signed)
Pt slept poorly through out the night. Pt c/o of pain and leg spasms and extreme discomfort with the bed. This nurse offered to reposition the pt, pt declined. Provider will be notified about poor sleeping.

## 2019-06-26 NOTE — Progress Notes (Signed)
Pt c/o of 10/10 buttocks pain. Pt requesting the dressing to be changed. Pt educated on the importance of only changing the dressing when soiled or the ordered time. Pt dressing was checked to make sure there was no drainage. Dressing was clean and dry at this time.

## 2019-06-26 NOTE — Progress Notes (Signed)
[facility] PHYSICAL MEDICINE & REHABILITATION PROGRESS NOTE   Subjective/Complaints:  Pt without specific new complaints. Ready to do therapy today. No breathing, choking, coughing issues  ROS: Limited due to cognitive/behavioral/speech   Objective:   No results found. No results for input(s): WBC, HGB, HCT, PLT in the last 72 hours. Recent Labs    06/26/19 0519  CREATININE 0.70    Intake/Output Summary (Last 24 hours) at 06/26/2019 1134 Last data filed at 06/26/2019 0314 Gross per 24 hour  Intake 3750.91 ml  Output 525 ml  Net 3225.91 ml     Physical Exam: Vital Signs Blood pressure (!) 157/92, pulse 69, temperature 98.1 F (36.7 C), resp. rate 12, height 5\' 11"  (1.803 m), weight 66.3 kg, SpO2 98 %. Constitutional: No distress . Vital signs reviewed. Frail appearing HEENT: EOMI, oral membranes moist Neck: supple Cardiovascular: RRR without murmur. No JVD    Respiratory/Chest: CTA Bilaterally without wheezes or rales. Normal effort    GI/Abdomen: BS +, non-tender, non-distended Ext: no clubbing, cyanosis, or edema Psych: pleasant and cooperative  Skin:s  facial dermatitis erythema and flaking improving,   Neurologic: severe dysarthria. Cranial nerves II through XII intact, motor strength is 0/5 in Left  deltoid, bicep, tricep, grip, hip flexor, knee extensors, ankle dorsiflexor and plantar flexor 3/5 R biceps, triceps, 2- finger flexion and ext, trace R hip knee ext synergy Sensory exam normal sensation to light touch and proprioception in bilateral upper and lower extremities Cerebellar exam NA d/t weakness   Musculoskeletal:  No joint swelling      Assessment/Plan: 1. 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  Physiatrist is providing close team supervision and 24 hour management of active medical problems listed  below.  Physiatrist and rehab team continue to assess barriers to discharge/monitor patient progress toward functional and medical goals  Care Tool:  Bathing        Body parts bathed by helper: Right arm, Left arm, Chest, Abdomen, Front perineal area, Buttocks, Right upper leg, Left upper leg, Right lower leg, Left lower leg, Face     Bathing assist Assist Level: Dependent - Patient 0%     Upper Body Dressing/Undressing Upper body dressing   What is the patient wearing?: Pull over shirt    Upper body assist Assist Level: 2 Helpers(sitting unsupported EOB)    Lower Body Dressing/Undressing Lower body dressing      What is the patient wearing?: Pants     Lower body assist Assist for lower body dressing: Dependent - Patient 0%     Toileting Toileting    Toileting assist Assist for toileting: 2 Helpers     Transfers Chair/bed transfer  Transfers assist  Chair/bed transfer activity did not occur: Safety/medical concerns  Chair/bed transfer assist level: Total Assistance - Patient < 25%(squat pivot)     Locomotion Ambulation   Ambulation assist   Ambulation activity did not occur: Safety/medical concerns  Assist level: 2 helpers Assistive device: No Device(three muskateers) Max distance: 9'   Walk 10 feet activity   Assist  Walk 10 feet activity did not occur: Safety/medical concerns        Walk 50 feet activity   Assist Walk 50 feet with 2 turns activity did not occur: Safety/medical concerns         Walk 150 feet activity   Assist Walk 150 feet activity did not occur: Safety/medical  concerns         Walk 10 feet on uneven surface  activity   Assist Walk 10 feet on uneven surfaces activity did not occur: Safety/medical Engineer, technical sales activity did not occur: Safety/medical concerns         Wheelchair 50 feet with 2 turns activity    Assist    Wheelchair 50 feet with 2  turns activity did not occur: Safety/medical concerns       Wheelchair 150 feet activity     Assist  Wheelchair 150 feet activity did not occur: Safety/medical concerns       Blood pressure (!) 157/92, pulse 69, temperature 98.1 F (36.7 C), resp. rate 12, height 5\' 11"  (1.803 m), weight 66.3 kg, SpO2 98 %.  Medical Problem List and Plan: 1.Dysarthria/quadriparesis/aphasiasecondary to multifocal infarcts mostly posterior circulation but also bilateral anterior circulation cardioembolic pattern as well as innominate artery occlusion, left ICA thrombus versus soft plaque, right V4 and basal artery occlusive thrombus  Continue CIR, PT,  OT,   Continue efforts at increasing oral intake, doing better with caloric intake but still very low on fluid intake and requiring IV  2. Antithrombotics: -DVT/anticoagulation:Lovenox  Creatinine within normal limits on 5/1 -antiplatelet therapy: Aspirin 325 mg and Plavix 75 mg daily- will not hold plavix until IR indicates PEG is scheduled  3. Pain Management:Tramadol as needed  Lumbar pain (location difficult to assess due to communication deficits)-  Xray unremarkable , technically suboptimal d/t contrast in colon this applies only to lateral images, AP looks OK , likely bedrest is causing increased pain.  According to family he has had chronic low back pain as well.     -encourage re-positioning/OOB as much as possible, supportive seating     dental pain ? improved- left upper molar ,  No sign of abscess, + gingivitis, unable to do orthopantogram because of poor sitting balance 4. Mood:Wellbutrin 150 mg twice daily, may need trial of Nuedexta, expect PBA given extensive posterior > anterior circulation infarcts -antipsychotic agents: Seroquel 25 mg 3 times daily as needed 5. Neuropsych: This patientis notcapable of making decisions on hisown behalf.  6. Skin/Wound Care:Routine skin checks- sacrococcygeal eschar,  MASD, cont foam with collagenase on eschar , if no better in a week may need debridement Seborrheic dermatitis - start valisone  7. Fluids/Electrolytes/Nutrition:Routine in and outs  BMP within acceptable range on 4/26 8. Post stroke dysphagia. Dysphagia #1 honey thick liquids, advance diet as tolerated.    Consulted IR for PEG placement -national shortage of PEG tubes awaiting shipment.  Continue IVF, ate 100% breakfast and lunch 5/7. Dinner not recorded 9. Orthostatic hypotension. Monitor with increased mobility Vitals:   06/25/19 2000 06/26/19 0316  BP: (!) 154/95 (!) 157/92  Pulse: 83 69  Resp:    Temp: 97.9 F (36.6 C) 98.1 F (36.7 C)  SpO2: 100% 98%    DBP elevated today---observe as elevation has been intermittent only 10. Hyperlipidemia. Lipitor 11. Tobacco abuse. NicoDerm patch.  Counseled when possible 12. Leukocytosis. -Per CT abd  RLL atelectasis vs PNA, given pt afebrile , WBCs elevated but stable     5/8 no new cough, afebrile  -encourage OOB, aspriation precautions  -continue to monitor LOS: 14 days A FACE TO FACE EVALUATION WAS PERFORMED  7/8 06/26/2019, 11:34 AM

## 2019-06-27 ENCOUNTER — Encounter (HOSPITAL_COMMUNITY): Payer: Self-pay | Admitting: Physical Medicine & Rehabilitation

## 2019-06-27 NOTE — Progress Notes (Signed)
[place] PHYSICAL MEDICINE & REHABILITATION PROGRESS NOTE   Subjective/Complaints:  Seems to be in a little better spirits today. Wife at bedside. Anxious to get home. Determined to walk. Backside sore from wounds/bed  ROS: Patient denies fever, rash, sore throat, blurred vision, nausea, vomiting, diarrhea, cough, shortness of breath or chest pain,   headache, or mood change.    Objective:   No results found. No results for input(s): WBC, HGB, HCT, PLT in the last 72 hours. Recent Labs    06/26/19 0519  CREATININE 0.70    Intake/Output Summary (Last 24 hours) at 06/27/2019 0955 Last data filed at 06/27/2019 0830 Gross per 24 hour  Intake 230 ml  Output --  Net 230 ml     Physical Exam: Vital Signs Blood pressure (!) 168/97, pulse 78, temperature 98.4 F (36.9 C), temperature source Oral, resp. rate 18, height 5\' 11"  (1.803 m), weight 66.3 kg, SpO2 95 %. Constitutional: No distress . Vital signs reviewed. HEENT: EOMI, oral membranes moist Neck: supple Cardiovascular: RRR without murmur. No JVD    Respiratory/Chest: CTA Bilaterally without wheezes or rales. Normal effort    GI/Abdomen: BS +, non-tender, non-distended Ext: no clubbing, cyanosis, or edema Psych: pleasant and cooperative Skin:s  facial dermatitis erythema and flaking improving,   Neurologic: severe dysarthria. Cranial nerves II through XII intact, motor strength is 0/5 in Left  deltoid, bicep, tricep, grip, hip flexor, knee extensors, ankle dorsiflexor and plantar flexor 3/5 R biceps, triceps, 2- finger flexion and ext, trace R hip knee ext synergy Sensory exam normal sensation to light touch and proprioception in bilateral upper and lower extremities. LUE biceps tone 3/4. 1/4 wrist/finger flexors. Extensor tone 1-2/4 in LE's.  Cerebellar exam NA d/t weakness   Musculoskeletal:  No joint swelling      Assessment/Plan: 1. 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  Physiatrist is providing close team supervision and 24 hour management of active medical problems listed below.  Physiatrist and rehab team continue to assess barriers to discharge/monitor patient progress toward functional and medical goals  Care Tool:  Bathing        Body parts bathed by helper: Right arm, Left arm, Chest, Abdomen, Front perineal area, Buttocks, Right upper leg, Left upper leg, Right lower leg, Left lower leg, Face     Bathing assist Assist Level: Dependent - Patient 0%     Upper Body Dressing/Undressing Upper body dressing   What is the patient wearing?: Pull over shirt    Upper body assist Assist Level: 2 Helpers(sitting unsupported EOB)    Lower Body Dressing/Undressing Lower body dressing      What is the patient wearing?: Pants     Lower body assist Assist for lower body dressing: Dependent - Patient 0%     Toileting Toileting    Toileting assist Assist for toileting: 2 Helpers     Transfers Chair/bed transfer  Transfers assist  Chair/bed transfer activity did not occur: Safety/medical concerns  Chair/bed transfer assist level: Total Assistance - Patient < 25%     Locomotion Ambulation   Ambulation assist   Ambulation activity did not occur: Safety/medical concerns  Assist level: 2 helpers Assistive device: No Device(three muskateers) Max distance: 9'   Walk 10 feet activity   Assist  Walk 10 feet activity did not occur: Safety/medical concerns        Walk 50  feet activity   Assist Walk 50 feet with 2 turns activity did not occur: Safety/medical concerns         Walk 150 feet activity   Assist Walk 150 feet activity did not occur: Safety/medical concerns         Walk 10 feet on uneven surface  activity   Assist Walk 10 feet on uneven surfaces activity did not occur: Safety/medical concerns          Wheelchair     Assist     Wheelchair activity did not occur: Safety/medical concerns         Wheelchair 50 feet with 2 turns activity    Assist    Wheelchair 50 feet with 2 turns activity did not occur: Safety/medical concerns       Wheelchair 150 feet activity     Assist  Wheelchair 150 feet activity did not occur: Safety/medical concerns       Blood pressure (!) 168/97, pulse 78, temperature 98.4 F (36.9 C), temperature source Oral, resp. rate 18, height 5\' 11"  (1.803 m), weight 66.3 kg, SpO2 95 %.  Medical Problem List and Plan: 1.Dysarthria/quadriparesis/aphasiasecondary to multifocal infarcts mostly posterior circulation but also bilateral anterior circulation cardioembolic pattern as well as innominate artery occlusion, left ICA thrombus versus soft plaque, right V4 and basal artery occlusive thrombus  Continue CIR, PT,  OT,   Patient seems motivated  -reviewed stretches with pt/wife today 2. Antithrombotics: -DVT/anticoagulation:Lovenox  Creatinine within normal limits on 5/1 -antiplatelet therapy: Aspirin 325 mg and Plavix 75 mg daily- will not hold plavix until IR indicates PEG is scheduled  3. Pain Management:Tramadol as needed  Lumbar pain (location difficult to assess due to communication deficits)-  Xray unremarkable , technically suboptimal d/t contrast in colon this applies only to lateral images, AP looks OK , likely bedrest is causing increased pain.  According to family he has had chronic low back pain as well.     -encourage re-positioning/OOB as much as possible, supportive seating     dental pain ? improved- left upper molar ,  No sign of abscess, + gingivitis, unable to do orthopantogram because of poor sitting balance 4. Mood:Wellbutrin 150 mg twice daily, may need trial of Nuedexta, expect PBA given extensive posterior > anterior circulation infarcts -antipsychotic agents: Seroquel 25 mg 3 times daily as  needed 5. Neuropsych: This patientis notcapable of making decisions on hisown behalf.  6. Skin/Wound Care:Routine skin checks- sacrococcygeal eschar, MASD, cont foam with collagenase on eschar , if no better in a week may need debridement Seborrheic dermatitis - start valisone  7. Fluids/Electrolytes/Nutrition:Routine in and outs  BMP within acceptable range on 4/26 8. Post stroke dysphagia. Dysphagia #1 honey thick liquids, advance diet as tolerated.    Consulted IR for PEG placement -national shortage of PEG tubes awaiting shipment.  Continue IVF, taking in solids fairly well. Struggles with liquids (honey) 9. Orthostatic hypotension. Monitor with increased mobility Vitals:   06/26/19 2022 06/27/19 0505  BP: (!) 148/86 (!) 168/97  Pulse: 82 78  Resp:    Temp: 98.4 F (36.9 C) 98.4 F (36.9 C)  SpO2: 98% 95%    5/9 DBP still elevated at times but intermittent. May be pain related, too. No changes to regimen today 10. Hyperlipidemia. Lipitor 11. Tobacco abuse. NicoDerm patch.  Counseled when possible 12. Leukocytosis. -Per CT abd  RLL atelectasis vs PNA, given pt afebrile , WBCs elevated but stable     5/8 no new cough, afebrile  -  encourage OOB, aspriation precautions. Pt/wife aware  -continue to monitor LOS: 15 days A FACE TO FACE EVALUATION WAS PERFORMED  Ranelle Oyster 06/27/2019, 9:55 AM

## 2019-06-27 NOTE — Plan of Care (Signed)
  Problem: Consults Goal: RH STROKE PATIENT EDUCATION Description: See Patient Education module for education specifics  Outcome: Progressing   Problem: RH BOWEL ELIMINATION Goal: RH STG MANAGE BOWEL WITH ASSISTANCE Description: STG Manage Bowel with mod Assistance. Outcome: Progressing Goal: RH STG MANAGE BOWEL W/MEDICATION W/ASSISTANCE Description: STG Manage Bowel with Medication with mod Assistance. Outcome: Progressing   Problem: RH BLADDER ELIMINATION Goal: RH STG MANAGE BLADDER WITH ASSISTANCE Description: STG Manage Bladder With mod Assistance Outcome: Progressing   Problem: RH SKIN INTEGRITY Goal: RH STG MAINTAIN SKIN INTEGRITY WITH ASSISTANCE Description: STG Maintain Skin Integrity With mod Assistance. Outcome: Progressing Goal: RH STG ABLE TO PERFORM INCISION/WOUND CARE W/ASSISTANCE Description: STG Able To Perform Incision/Wound Care With max Assistance. Outcome: Progressing   Problem: RH SAFETY Goal: RH STG ADHERE TO SAFETY PRECAUTIONS W/ASSISTANCE/DEVICE Description: STG Adhere to Safety Precautions With min Assistance/Device. Outcome: Progressing   Problem: RH COGNITION-NURSING Goal: RH STG USES MEMORY AIDS/STRATEGIES W/ASSIST TO PROBLEM SOLVE Description: STG Uses Memory Aids/Strategies With min Assistance to Problem Solve. Outcome: Progressing Goal: RH STG ANTICIPATES NEEDS/CALLS FOR ASSIST W/ASSIST/CUES Description: STG Anticipates Needs/Calls for Assist With min Assistance/Cues. Outcome: Progressing   Problem: RH PAIN MANAGEMENT Goal: RH STG PAIN MANAGED AT OR BELOW PT'S PAIN GOAL Description: Less than 3 out of 10 Outcome: Progressing   Problem: RH KNOWLEDGE DEFICIT Goal: RH STG INCREASE KNOWLEDGE OF HYPERTENSION Description: Patient/family will be able to identify medication to treat HTN along with 2 other nonpharmalogical interventions Outcome: Progressing Goal: RH STG INCREASE KNOWLEDGE OF DYSPHAGIA/FLUID INTAKE Description: Patient will be  able to demonstrate understanding of thickened fluid necessity Outcome: Progressing Goal: RH STG INCREASE KNOWLEDGE OF STROKE PROPHYLAXIS Description: Patient/Family will be able to verbalize to 3 medications for stroke prevention post discharge  Outcome: Progressing

## 2019-06-28 ENCOUNTER — Inpatient Hospital Stay (HOSPITAL_COMMUNITY): Payer: Self-pay | Admitting: Occupational Therapy

## 2019-06-28 ENCOUNTER — Inpatient Hospital Stay (HOSPITAL_COMMUNITY): Payer: Self-pay

## 2019-06-28 MED ORDER — TRAMADOL HCL 50 MG PO TABS
100.0000 mg | ORAL_TABLET | Freq: Three times a day (TID) | ORAL | Status: DC
  Administered 2019-06-28 – 2019-06-29 (×3): 100 mg via ORAL
  Filled 2019-06-28 (×3): qty 2

## 2019-06-28 NOTE — Progress Notes (Signed)
Physical Therapy Session Note  Patient Details  Name: Adrian [name] MRN: [id number] Date of Birth: [birth year]  Today's Date: 06/28/2019 PT Individual Time: 1000-1100 PT Individual Time Calculation (min): 60 min   Short Term Goals: Week 2:  PT Short Term Goal 1 (Week 2): Pt will roll R/L with mod asssist of 1 PT Short Term Goal 2 (Week 2): Pt will perform supine<>sit with max assist of 1 PT Short Term Goal 3 (Week 2): Pt will perform bed<>chair transfers with total assist of 1 PT Short Term Goal 4 (Week 2): Pt will tolerate x 5 minutes in the standing frame for LE weightbearing and standing tolerance, dependent assist. PT Short Term Goal 5 (Week 2): Pt will tolerate sitting in TIS w/c for 1 hour outside of therapy session  Skilled Therapeutic Interventions/Progress Updates:    Pt sidelying in bed upon PT arrival, agreeable to therapy tx and reports pain in his "ass" throughout session but does not give number rating, providing repositioning throughout the session for pain management. Pt also reports that he did not sleep well last night and feels tired. Pt transferred to sitting with max +2 assist, squat pivot to the w/c with max +2 assist and transported to the gym. Pt performed x 2 sit<>stands this session from w/c with max +2 assist, able to maintain standing for 2-3 min bouts, in standing pt able to extend R LE with good quad activation, attempts to extend L LE however limited by adductor and flexor tone. Between bouts of standing pt able to maintain sitting balance on edge of seat with CGA, min assist for lateral weightshift in sitting for pressure relief/comfort. After standing pt reports "take me back, take me back." Pt requesting to go back to his room as his pain is limiting participation in therapy today. Pt transported back to room, becoming inpatient at times "hurry up, hurry up." Pt transferred back to bed squat pivot max assist +2 and sit>supine +2 assist. Pt positioned in sidelying for  pressure relief. Pt agreeable to work on LE exercises in bed. Therapist performed B hamstring stretching this session x 30 sec, performed R hip flexor stretch 2 x 30 sec and performed heel cord stretches 2 x 30 sec bilaterally. In sidelying pt performed R LE hip flexion AROM x 5, hip extension AROM x 5 and knee flexion/extension AROM x 10. In sidelying pt performed L LE AAROM exercises for strength and neuro re-ed: x 20 knee extension (added assist to move through full range), x 5 hip flexion and x 5 hip extension. During exercises pt reports "I want to walk in two weeks," also reports "I with I could have done more today," Therapist providing emotional support and encouragement. Family present and therapist provided education on performing these exercises with pt between therapies and on weekends when he gets limited therapy. Pt left in bed at end of session with needs in reach and family present.    Therapy Documentation Precautions:  Precautions Precautions: Fall, Other (comment) Precaution Comments: check BP on left arm due to occlusion on R side. Restrictions Weight Bearing Restrictions: No    Therapy/Group: Individual Therapy  Cresenciano Genre, PT, DPT, CSRS 06/28/2019, 7:45 AM

## 2019-06-28 NOTE — Progress Notes (Signed)
Occupational Therapy Session Note  Patient Details  Name: Adrian [name] MRN: [id number] Date of Birth: 06-Jun-1965  Today's Date: 06/28/2019 OT Individual Time: 0800-0903 OT Individual Time Calculation (min): 63 min    Short Term Goals: Week 2:  OT Short Term Goal 1 (Week 2): Pt will completed rolling in the bed to assist with LB dressing with max assist. OT Short Term Goal 2 (Week 2): Pt will use the RUE with mod assist to donn his shirt over the left arm during supported UB dressing. OT Short Term Goal 3 (Week 2): Pt will maintain static sitting balance EOM or EOB with min guard assist for up to 2 mins in preparation for selfcare tasks.  Skilled Therapeutic Interventions/Progress Updates:    Session 1: [phone])  Pt in bed to start with family assisting with donning pullover shirt.  Therapist then provided total assist for donning shorts over his LEs.  He then rolled to the left and transitioned to sitting with total assist.  He completed sit to stand with total assist +2 (pt 25%) in order for shorts to be pulled over his hips.  He then transferred to the wheelchair with total assist squat pivot.  Once in the chair, he immediately needed to be reclined secondary to unbearable pain in his buttocks.  Therapist took him down to the ortho gym where he worked on standing in the standing frame.  He was able to complete standing for intervals of 6 mins in the machine with therapist at times, not extending him all the way up to standing and having him work on achieving hip extension with max assist.  Therapist also provided passive stretching and positioning to the LUE.  Pt with reports of increased ear ringing in the right eye which increased during session.  Returned to the room at the end of the session with transfer squat pivot with total assist to the bed and then to supine at the same level.  Pt left in sidelying with family and MD present.    Session 2:  ([id number]) Pt in bed to start session  working on eating with total assist in sidelying in the bed.  [name] was assisting with feeding him secondary to not being able to tolerate sitting up for eating.  Therapist donned brief in supine with max assist for rolling side to side and total assist to donn the brief and pull his shorts up over his hips.  He then transferred to the EOB with total assist.  Pt with increased pain in sitting, requesting quick transfer to the wheelchair.  Total assist was needed for squat pivot transfer to the wheelchair.  Noted increased tone in his LEs at the hamstrings and adductors during transfer.  Tilted him back in the wheelchair for comfort.  He was then taken down to the ortho gym for work on standing with use of the standing frame.  He was able to complete 4 intervals of 5 mins in the standing frame.  While standing therapist applied Saebo Go Stim One to the triceps of the LUE for 10 mins.  Had pt work on activating elbow extension in the LUE along with the stimulation in order to help decrease tone in the biceps.  He was able to tolerate for 10 mins in sitting and when resting.  Therapist then moved the stimulation to the volar forearm for work on digit extensors as well.  He tolerated 10 mins of active stimulation without any adverse reactions.  See below  for settings of NMES.  Also had pt work on active shoulder flexion while in the standing frame.  He needed min assist overall for activation of shoulder flexion to approximately 90 degrees for 10 repetitions.  Max assist was needed for completion of this with the LUE for 10 reps as well.  Finished session with transfer back to the room and then back to the bed with total assist squat pivot.  He needed total assist for sit to supine as well and was left on his left side for positioning.  Pt's family in the room with him as well.   Saebo Stim One 330 pulse width 35 Hz pulse rate On 8 sec/ off 8 sec Ramp up/ down 2 sec Symmetrical Biphasic wave form  Max  intensity at 500 Ohm load   Therapy Documentation Precautions:  Precautions Precautions: Fall, Other (comment) Precaution Comments: check BP on left arm due to occlusion on R side. Restrictions Weight Bearing Restrictions: No  Pain: Pain Assessment Pain Scale: Faces Faces Pain Scale: Hurts even more Pain Type: Acute pain Pain Location: Buttocks Pain Orientation: Mid Pain Descriptors / Indicators: Discomfort;Grimacing;Stabbing Pain Onset: With Activity Pain Intervention(s): Repositioned;Emotional support ADL: See Care Tool Section for some details of mobility and selfcare  Therapy/Group: Individual Therapy  Mckennah Kretchmer OTR/L 06/28/2019, 9:25 AM

## 2019-06-28 NOTE — Progress Notes (Signed)
[facility] PHYSICAL MEDICINE & REHABILITATION PROGRESS NOTE   Subjective/Complaints:  No issues overnite except sacral pain disrupting sleep  ROS: Patient denies CP, SOB N/V/D  + buttocks pain   Objective:   No results found. No results for input(s): WBC, HGB, HCT, PLT in the last 72 hours. Recent Labs    06/26/19 0519  CREATININE 0.70   No intake or output data in the 24 hours ending 06/28/19 0910   Physical Exam: Vital Signs Blood pressure (!) 157/90, pulse 86, temperature 98.1 F (36.7 C), temperature source Oral, resp. rate 18, height 5\' 11"  (1.803 m), weight 66.3 kg, SpO2 97 %. Constitutional: No distress . Vital signs reviewed. HEENT: EOMI, oral membranes moist Neck: supple Cardiovascular: RRR without murmur. No JVD    Respiratory/Chest: CTA Bilaterally without wheezes or rales. Normal effort    GI/Abdomen: BS +, non-tender, non-distended Ext: no clubbing, cyanosis, or edema Psych: pleasant and cooperative Skin:s  facial dermatitis improved    Neurologic: severe dysarthria. Cranial nerves II through XII intact, motor strength is 0/5 in Left  deltoid, bicep, tricep, grip, hip flexor, knee extensors, ankle dorsiflexor and plantar flexor 3/5 R biceps, triceps, 2- finger flexion and ext, trace R hip knee ext synergy Sensory exam normal sensation to light touch and proprioception in bilateral upper and lower extremities. LUE biceps tone 3/4. 1/4 wrist/finger flexors. Extensor tone 1-2/4 in LE's.  Cerebellar exam NA d/t weakness   Musculoskeletal:  No joint swelling  Photo 06/21/2019    06/28/2019   Assessment/Plan: 1. 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  Physiatrist is providing close team supervision and 24 hour management of active medical problems listed below.  Physiatrist and rehab team continue to assess barriers to  discharge/monitor patient progress toward functional and medical goals  Care Tool:  Bathing        Body parts bathed by helper: Right arm, Left arm, Chest, Abdomen, Front perineal area, Buttocks, Right upper leg, Left upper leg, Right lower leg, Left lower leg, Face     Bathing assist Assist Level: Dependent - Patient 0%     Upper Body Dressing/Undressing Upper body dressing   What is the patient wearing?: Pull over shirt    Upper body assist Assist Level: 2 Helpers(sitting unsupported EOB)    Lower Body Dressing/Undressing Lower body dressing      What is the patient wearing?: Pants     Lower body assist Assist for lower body dressing: Dependent - Patient 0%     Toileting Toileting    Toileting assist Assist for toileting: 2 Helpers     Transfers Chair/bed transfer  Transfers assist  Chair/bed transfer activity did not occur: Safety/medical concerns  Chair/bed transfer assist level: Total Assistance - Patient < 25%     Locomotion Ambulation   Ambulation assist   Ambulation activity did not occur: Safety/medical concerns  Assist level: 2 helpers Assistive device: No Device(three muskateers) Max distance: 9'   Walk 10 feet activity   Assist  Walk 10 feet activity did not occur: Safety/medical concerns        Walk 50 feet activity   Assist Walk 50 feet with 2 turns activity did not occur: Safety/medical concerns         Walk 150 feet activity   Assist Walk 150 feet activity did not occur: Safety/medical concerns  Walk 10 feet on uneven surface  activity   Assist Walk 10 feet on uneven surfaces activity did not occur: Safety/medical Engineer, technical sales activity did not occur: Safety/medical concerns         Wheelchair 50 feet with 2 turns activity    Assist    Wheelchair 50 feet with 2 turns activity did not occur: Safety/medical concerns       Wheelchair 150  feet activity     Assist  Wheelchair 150 feet activity did not occur: Safety/medical concerns       Blood pressure (!) 157/90, pulse 86, temperature 98.1 F (36.7 C), temperature source Oral, resp. rate 18, height 5\' 11"  (1.803 m), weight 66.3 kg, SpO2 97 %.  Medical Problem List and Plan: 1.Dysarthria/quadriparesis/aphasiasecondary to multifocal infarcts mostly posterior circulation but also bilateral anterior circulation cardioembolic pattern as well as innominate artery occlusion, left ICA thrombus versus soft plaque, right V4 and basal artery occlusive thrombus  Continue CIR, PT,  OT,   Patient seems motivated  -reviewed stretches with pt/wife today 2. Antithrombotics: -DVT/anticoagulation:Lovenox  Creatinine within normal limits on 5/1 -antiplatelet therapy: Aspirin 325 mg and Plavix 75 mg daily- will not hold plavix until IR indicates PEG is scheduled  3. Pain Management:Tramadol as needed  Decubitus pain, increase tramadol to 100mg      -encourage re-positioning/OOB as much as possible, supportive seating     dental pain ? improved- left upper molar ,  No sign of abscess, + gingivitis, unable to do orthopantogram because of poor sitting balance 4. Mood:Wellbutrin 150 mg twice daily, may need trial of Nuedexta, expect PBA given extensive posterior > anterior circulation infarcts -antipsychotic agents: Seroquel 25 mg 3 times daily as needed 5. Neuropsych: This patientis notcapable of making decisions on hisown behalf.  6. Skin/Wound Care:Routine skin checks- sacrococcygeal eschar, , cont foam with collagenase on eschar , if no better in a week may need debridement- ask Gen Surg to eval - cont collagenase daily , MASD improved  Seborrheic dermatitis - improved on valisone  7. Fluids/Electrolytes/Nutrition:Routine in and outs  BMP within acceptable range on 4/26 8. Post stroke dysphagia. Dysphagia #1 honey thick liquids, advance diet as  tolerated.    Consulted IR for PEG placement -national shortage of PEG tubes awaiting shipment.  Continue IVF, taking in solids fairly well. Struggles with liquids (honey) 9. Orthostatic hypotension. Monitor with increased mobility Vitals:   06/27/19 1937 06/28/19 0542  BP: (!) 152/92 (!) 157/90  Pulse: 84 86  Resp: 16 18  Temp: 98 F (36.7 C) 98.1 F (36.7 C)  SpO2: 96% 97%   Elevated add BB  10. Hyperlipidemia. Lipitor 11. Tobacco abuse. NicoDerm patch.  Counseled when possible 12. Leukocytosis. -Per CT abd  RLL atelectasis vs PNA, given pt afebrile , WBCs elevated but stable     5/8 no new cough, afebrile  -encourage OOB, aspriation precautions. Pt/wife aware  -continue to monitor LOS: 16 days A FACE TO FACE EVALUATION WAS PERFORMED  08/28/19 06/28/2019, 9:10 AM

## 2019-06-28 NOTE — Progress Notes (Signed)
Be advised pt refused  prevlon boots for the night. Pt also refused wrist splint, and prafo boots. Pt was educated on risks of not wearing them. Pt complained of pain 10/10 and was given medication and offered repositioning. Pt was repositioned once throughout the night. He was encouraged and educated on repositioning on side with wedge, but refused. Dressing on buttocks was changed.

## 2019-06-29 ENCOUNTER — Encounter (HOSPITAL_COMMUNITY): Payer: Self-pay | Admitting: Physical Medicine & Rehabilitation

## 2019-06-29 ENCOUNTER — Inpatient Hospital Stay (HOSPITAL_COMMUNITY): Payer: Self-pay | Admitting: Occupational Therapy

## 2019-06-29 ENCOUNTER — Inpatient Hospital Stay (HOSPITAL_COMMUNITY): Payer: Self-pay

## 2019-06-29 ENCOUNTER — Inpatient Hospital Stay (HOSPITAL_COMMUNITY): Payer: Self-pay | Admitting: Speech Pathology

## 2019-06-29 MED ORDER — OXYCODONE HCL 5 MG PO TABS
5.0000 mg | ORAL_TABLET | Freq: Four times a day (QID) | ORAL | Status: DC | PRN
  Administered 2019-06-29 – 2019-07-04 (×14): 5 mg via ORAL
  Filled 2019-06-29 (×17): qty 1

## 2019-06-29 NOTE — Progress Notes (Signed)
Physical Therapy Weekly Progress Note  Patient Details  Name: Adrian [name] MRN: [id number] Date of Birth: [birth year]  Beginning of progress report period: Jun 22, 2019 End of progress report period: Jun 29, 2019  Today's Date: 06/29/2019 PT Individual Time: 1105-1203 PT Individual Time Calculation (min): 58 min   Patient has met 2 of 5 short term goals. Adrian [name] demonstrates good progression with therapy this week with overall improving trunk control, bed mobility, transfers, and progression with standing/ambulation. He continues to be limited in his tolerance of upright sitting and mobility tasks due to sacral wound pain. He is maintaining static sitting balance with CGA for safety but when progressing to minor dynamic sitting balance tasks requires max/total assist to maintain upright. He is performing rolling L with max assist due to pt using R UE to assist with rolling and R with max/total assist; squat pivot transfers with max/total assist for lifting and pivoting with pt demonstrating increased B LE (R>L) quad muscle activation to lift hips, sit<>stands with +2 max assist for lifting, and progressed to ambulation ~75f in litegait harness with +3 assist for B LE management and litegait management. Patient demonstrates significant flexor and adductor tone in B LEs (L>R) resulting in crouched posture in stance requiring manual facilitation for improvement.  Patient continues to demonstrate the following deficits muscle weakness, muscle joint tightness and muscle paralysis, decreased cardiorespiratoy endurance, impaired timing and sequencing, abnormal tone, unbalanced muscle activation, motor apraxia, decreased coordination and decreased motor planning and decreased sitting balance, decreased standing balance, decreased postural control and decreased balance strategies and therefore will continue to benefit from skilled PT intervention to increase functional independence with mobility.  Patient  progressing toward long term goals..  Continue plan of care.  PT Short Term Goals Week 2:  PT Short Term Goal 1 (Week 2): Pt will roll R/L with mod asssist of 1 PT Short Term Goal 1 - Progress (Week 2): Progressing toward goal PT Short Term Goal 2 (Week 2): Pt will perform supine<>sit with max assist of 1 PT Short Term Goal 2 - Progress (Week 2): Progressing toward goal PT Short Term Goal 3 (Week 2): Pt will perform bed<>chair transfers with total assist of 1 PT Short Term Goal 3 - Progress (Week 2): Met PT Short Term Goal 4 (Week 2): Pt will tolerate x 5 minutes in the standing frame for LE weightbearing and standing tolerance, dependent assist. PT Short Term Goal 4 - Progress (Week 2): Met PT Short Term Goal 5 (Week 2): Pt will tolerate sitting in TIS w/c for 1 hour outside of therapy session PT Short Term Goal 5 - Progress (Week 2): Progressing toward goal Week 3:  PT Short Term Goal 1 (Week 3): Pt will perform supine<>sit with max assist of 1 PT Short Term Goal 2 (Week 3): Pt wil perform bed<>chair transfer with max assist of 1 PT Short Term Goal 3 (Week 3): Pt will perform sit<>stand with max assist of 1 using LRAD PT Short Term Goal 4 (Week 3): Pt will ambulate at least 414fusing LRAD with +2 max assist  Skilled Therapeutic Interventions/Progress Updates:  Ambulation/gait training;Community reintegration;DME/adaptive equipment instruction;Neuromuscular re-education;Psychosocial support;Stair training;UE/LE Strength taining/ROM;Wheelchair propulsion/positioning;Balance/vestibular training;Discharge planning;Pain management;Functional electrical stimulation;Skin care/wound management;Therapeutic Activities;UE/LE Coordination activities;Cognitive remediation/compensation;Disease management/prevention;Functional mobility training;Patient/family education;Splinting/orthotics;Therapeutic Exercise;Visual/perceptual remediation/compensation   Pt received L sidelying in bed with family present and  pt reporting he just had debridement of sacral wound performed but despite this pt agreeable to therapy session. Supine>sitting L EOB with  total assist of 1 for trunk upright and +2 assist for B LE management - pt requiring more assistance today due to pain. R squat pivot to w/c with total assist for lifting and pivoting hips though demonstrating improving B LE quad activation to lift hips. Transported to/from gym in w/c for time management and energy conservation. Standing with +2 max/total assist while 3rd person donned litegait harness. Gait training ~37f overground in litegait harness with +3 assist (one PT per LE, tech for IV pole, and 3rd PT for litegait management) - pt able to initiate stepping with B LEs (R more than L due to significant flexor and adductor tone) but requires assist to complete step - pt demonstrates ability to initiate R knee extension during stance but requires cuing and increased time whereas he requires manual facilitation for L knee extension during stance - pt remains in crouched posture with trunk, hip, knee flexion during gait due to significant flexor and adductor tone. Due to pain pt unable to ambulate further - doffed litegait harness. Performed 1x sit<>stand with 3 Musketeer UE support and max assist with pt able to initiate B LE knee extension to come to stand. Due to increased pain pt requesting to return to room. L squat pivot to EOB with total assist for lifting/pivoting hips. Sit>L sidelying with +2 total assist to avoid shearing forces on sacrum. Performed oral care total assist. Pt remained L sidelying in bed with needs in reach, family present, and lines intact.    Therapy Documentation Precautions:  Precautions Precautions: Fall, Other (comment) Precaution Comments: check BP on left arm due to occlusion on R side. Restrictions Weight Bearing Restrictions: No  Pain: Continues to have pain at pressure wound site but more intense today due to debridement.    Therapy/Group: Individual Therapy  CTawana Scale PT, DPT 06/29/2019, 7:45 AM

## 2019-06-29 NOTE — Progress Notes (Signed)
Occupational Therapy Session Note  Patient Details  Name: Adrian [name] MRN: [id number] Date of Birth: 10/05/1965  Today's Date: 06/29/2019 OT Individual Time: 0930-1000 OT Individual Time Calculation (min): 30 min    Short Term Goals: Week 2:  OT Short Term Goal 1 (Week 2): Pt will completed rolling in the bed to assist with LB dressing with max assist. OT Short Term Goal 2 (Week 2): Pt will use the RUE with mod assist to donn his shirt over the left arm during supported UB dressing. OT Short Term Goal 3 (Week 2): Pt will maintain static sitting balance EOM or EOB with min guard assist for up to 2 mins in preparation for selfcare tasks.    Skilled Therapeutic Interventions/Progress Updates:   Pt received supine in bed agreeable to OT intervention. Pt required total A to transition from supine>sit. Total A for stand pivot transfer to TIS to pts R side. Pt immediatly needed to be tilted d/t buttock pain. Pt transported to therapy gym in reclined position with total A. Pt working on standing tolerance in standing frame to facilitate BLE strength for higher level functional mobility tasks.  Pt able to stand 4 mins in standing frame. Worked on LUE AROM via horizontal ADD/ ABD with pt able to use hand over hand assist to complete horizontal ADD  with pt needing assist to extend LUE fully into ABD. Pt transported back to room with total A where pt completed total A to return to EOB and +2 from family to return to sidelying. Pt left in sidelying to pts L side with family present and all needs within reach.   Therapy Documentation Precautions:  Precautions Precautions: Fall, Other (comment) Precaution Comments: check BP on left arm due to occlusion on R side. Restrictions Weight Bearing Restrictions: No General:   Vital Signs:  Pain: Pt reports unrated pain in buttock during session. Utilized repositioning and distraction as pain mgmt strategy.    Therapy/Group: Individual Therapy  Angelina Pih 06/29/2019, 12:22 PM

## 2019-06-29 NOTE — Progress Notes (Signed)
[facility] PHYSICAL MEDICINE & REHABILITATION PROGRESS NOTE   Subjective/Complaints:  No issues last noc except buttock pain, discussed that pain can last for weeks   Increased tramadol not helping, allergy to codeine but per pt wife pt took oxycodone some years ago, did not take Oxy regularly   ROS: Patient denies CP, SOB N/V/D  + buttocks pain   Objective:   No results found. No results for input(s): WBC, HGB, HCT, PLT in the last 72 hours. No results for input(s): NA, K, CL, CO2, GLUCOSE, BUN, CREATININE, CALCIUM in the last 72 hours.  Intake/Output Summary (Last 24 hours) at 06/29/2019 0831 Last data filed at 06/29/2019 0535 Gross per 24 hour  Intake 980 ml  Output --  Net 980 ml     Physical Exam: Vital Signs Blood pressure (!) 149/108, pulse 95, temperature 97.9 F (36.6 C), temperature source Oral, resp. rate 16, height 5\' 11"  (1.803 m), weight 66.3 kg, SpO2 97 %.  General: No acute distress Mood and affect are appropriate Heart: Regular rate and rhythm no rubs murmurs or extra sounds Lungs: Clear to auscultation, breathing unlabored, no rales or wheezes Abdomen: Positive bowel sounds, soft nontender to palpation, nondistended Extremities: No clubbing, cyanosis, or edema Skin: No evidence of breakdown, no evidence of rash  Skin:s  facial dermatitis improved    Neurologic: severe dysarthria. Cranial nerves II through XII intact, motor strength is 0/5 in Left  deltoid, bicep, tricep, grip, hip flexor, knee extensors, ankle dorsiflexor and plantar flexor 3/5 R biceps, triceps, 2- finger flexion and ext, trace R hip knee ext synergy Sensory exam normal sensation to light touch and proprioception in bilateral upper and lower extremities. LUE biceps tone 3/4. 1/4 wrist/finger flexors. Extensor tone 1-2/4 in LE's.  Cerebellar exam NA d/t weakness   Musculoskeletal:  No joint swelling  Photo 06/21/2019    06/28/2019   Assessment/Plan: 1. 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  Physiatrist is providing close team supervision and 24 hour management of active medical problems listed below.  Physiatrist and rehab team continue to assess barriers to discharge/monitor patient progress toward functional and medical goals  Care Tool:  Bathing        Body parts bathed by helper: Right arm, Left arm, Chest, Abdomen, Front perineal area, Buttocks, Right upper leg, Left upper leg, Right lower leg, Left lower leg, Face     Bathing assist Assist Level: Dependent - Patient 0%     Upper Body Dressing/Undressing Upper body dressing   What is the patient wearing?: Pull over shirt    Upper body assist Assist Level: 2 Helpers(sitting unsupported EOB)    Lower Body Dressing/Undressing Lower body dressing      What is the patient wearing?: Pants     Lower body assist Assist for lower body dressing: Dependent - Patient 0%     Toileting Toileting    Toileting assist Assist for toileting: 2 Helpers     Transfers Chair/bed transfer  Transfers assist  Chair/bed transfer activity did not occur: Safety/medical concerns  Chair/bed transfer assist level: 2 Helpers     Locomotion Ambulation   Ambulation assist   Ambulation activity did not occur: Safety/medical concerns  Assist level: 2 helpers Assistive device: No Device(three muskateers) Max distance: 9'   Walk 10 feet activity   Assist  Walk 10 feet activity did not occur: Safety/medical concerns  Walk 50 feet activity   Assist Walk 50 feet with 2 turns activity did not occur: Safety/medical concerns         Walk 150 feet activity   Assist Walk 150 feet activity did not occur: Safety/medical concerns         Walk 10 feet on uneven surface  activity   Assist Walk 10 feet on uneven surfaces activity did not occur:  Safety/medical concerns         Wheelchair     Assist     Wheelchair activity did not occur: Safety/medical concerns         Wheelchair 50 feet with 2 turns activity    Assist    Wheelchair 50 feet with 2 turns activity did not occur: Safety/medical concerns       Wheelchair 150 feet activity     Assist  Wheelchair 150 feet activity did not occur: Safety/medical concerns       Blood pressure (!) 149/108, pulse 95, temperature 97.9 F (36.6 C), temperature source Oral, resp. rate 16, height 5\' 11"  (1.803 m), weight 66.3 kg, SpO2 97 %.  Medical Problem List and Plan: 1.Dysarthria/quadriparesis/aphasiasecondary to multifocal infarcts mostly posterior circulation but also bilateral anterior circulation cardioembolic pattern as well as innominate artery occlusion, left ICA thrombus versus soft plaque, right V4 and basal artery occlusive thrombus  Continue CIR, PT,  OT,   Team conf in am  2. Antithrombotics: -DVT/anticoagulation:Lovenox  Creatinine within normal limits on 5/1 -antiplatelet therapy: Aspirin 325 mg and Plavix 75 mg daily- will not hold plavix until IR indicates PEG is scheduled  3. Pain Management:Tramadol as needed  Decubitus pain, increase tramadol to 100mg      -encourage re-positioning/OOB as much as possible, supportive seating     dental pain ? improved- left upper molar ,  No sign of abscess, + gingivitis, unable to do orthopantogram because of poor sitting balance 4. Mood:Wellbutrin 150 mg twice daily, may need trial of Nuedexta, expect PBA given extensive posterior > anterior circulation infarcts -antipsychotic agents: Seroquel 25 mg 3 times daily as needed 5. Neuropsych: This patientis notcapable of making decisions on hisown behalf.  6. Skin/Wound Care:Routine skin checks- sacrococcygeal eschar, , cont foam with collagenase on eschar , if no better in a week may need debridement- ask Gen Surg to eval -  cont collagenase daily , MASD improved  Seborrheic dermatitis - improved on valisone  7. Fluids/Electrolytes/Nutrition:Routine in and outs  BMP within acceptable range on 4/26 8. Post stroke dysphagia. Dysphagia #1 honey thick liquids, advance diet as tolerated.    Consulted IR for PEG placement -national shortage of PEG tubes awaiting shipment.  Continue IVF, taking in solids fairly well. Struggles with liquids (honey) Pt now does not want feeding tube discuessed need to increase the fluid intake  9. Orthostatic hypotension. Monitor with increased mobility Vitals:   06/28/19 2011 06/29/19 0535  BP: (!) 154/88 (!) 149/108  Pulse: 88 95  Resp: 15 16  Temp: 98.1 F (36.7 C) 97.9 F (36.6 C)  SpO2: 97%    Elevated add BB  10. Hyperlipidemia. Lipitor 11. Tobacco abuse. NicoDerm patch.  Counseled when possible 12. Leukocytosis. -Per CT abd  RLL atelectasis vs PNA, given pt afebrile , WBCs elevated but stable     5/8 no new cough, afebrile  -encourage OOB, aspriation precautions. Pt/wife aware  -continue to monitor LOS: 17 days A FACE TO FACE EVALUATION WAS PERFORMED  08/29/19 06/29/2019, 8:31 AM

## 2019-06-29 NOTE — Progress Notes (Signed)
Occupational Therapy Weekly Progress Note  Patient Details  Name: Adrian [name] MRN: [id number] Date of Birth: 19-Aug-1965  Beginning of progress report period: Jun 22, 2019 End of progress report period: Jun 29, 2019  Today's Date: 06/29/2019 OT Individual Time: 1300-1414 OT Individual Time Calculation (min): 74 min    Patient has met 2 of 3 short term goals.  Adrian [name] continues to demonstrate slow but steady progression with OT sessions.  He overall still needs max assist for static sitting balance, however he has reached instances where he can maintain static sitting balance with BUE support and min guard assist for periods up to two mins.  He continues to be limited by pain in his sacrum when trying to sit up for any period of time, resulting in the absolute need to change position and either lay on his side or stand to get some pain relief.  He continues to demonstrate increased LUE tone in the elbow and digit flexors with max hand over hand assist needed to integrate it into function.  He is able to exhibit greater functional movement in the RUE with ability to complete functional table top reaching, grasp, and release with no more than min assist to pick up and place small 2" X 3" foam blocks with increased time.  He does exhibit some motor planning difficulty as well with this when trying to open his hand and place it around the block.  He is able to demonstrate active gross digit extension of approximately 90% as well as gross flexion of 90% in the RUE when asked to open and close his hand.  He is able to complete sit to stand with total assist +2 (pt 30%) but demonstrates significant BLE hamstrings and adductor tone making it difficult to achieve full knee extension in standing.  Max assist is needed to advance his LE as well with stepping at this time.  Feel overall he continues to make good progress, but is now even more limited secondary to his sacral pain and inability to tolerate sitting up  for long periods of time.  Recommend continued OT at CIR level to continue working toward established max assist level goals for home with expected discharge on 5/21.   Patient continues to demonstrate the following deficits: muscle weakness and muscle joint tightness, impaired timing and sequencing, abnormal tone, unbalanced muscle activation, motor apraxia and decreased coordination, decreased awareness, decreased safety awareness and decreased memory and decreased sitting balance, decreased standing balance, decreased postural control, hemiplegia and decreased balance strategies and therefore will continue to benefit from skilled OT intervention to enhance overall performance with BADL and Reduce care partner burden.  Patient progressing toward long term goals..  Continue plan of care.  OT Short Term Goals Week 3:  OT Short Term Goal 1 (Week 3): Pt will self feed with mod assist using the RUE with AE for 25% of meal. OT Short Term Goal 2 (Week 3): Pt will complete UB bathing with mod assist in supported sitting position. OT Short Term Goal 3 (Week 3): Pt will roll in the bed to the left side with max assist to help with supine to sit EOB or for donning LB clothing.  Skilled Therapeutic Interventions/Progress Updates:    Pt in bed to start session, needed total assist for transition to sitting from supine.  Once up, he immediately requested transfer to the tilt in space wheelchair secondary to increased sacral pain.  Placed pillow over wheelchair cushion as well per pt  request to help tolerate being up in the wheelchair.  He completed squat pivot transfer to the wheelchair with total assist.  Noted increased adductor and hamstring tone during the transfer.  Pt was taken down to the therapy gym where he transferred to the therapy mat at total assist as well.  Worked on static and dynamic sitting balance EOM while integrating RUE functional reach and LUE weightbearing.  Pt unable tolerate sitting unless  his weight is positioned forward no his ischial tuberosities.  Because of this, he constantly demonstrates a forward and forward and left LOB, requiring overall max assist for trunk control while reaching.  Therapist applied NMES to the left elbow extensors to assist with decreasing flexor tone in the biceps.  He was able to tolerate use of stimulation for a total of 25 mins in sitting as well as in sidelying, when pt was given a break secondary to fatigue and increase pain.  While he was in sidelying therapist had him work on pushing out against the therapist's hand to promote elbow extension with activation of stimulation.  Finished session with transfer back to the wheelchair at total assist and transition back to the room.  He was able to then transfer to the bed at the same level where he was transitioned into sidelying.  Pt left with family present and call button and phone in reach.  See below for parameters of stimulation settings. Saebo Stim One 330 pulse width 35 Hz pulse rate On 8 sec/ off 8 sec Ramp up/ down 2 sec Symmetrical Biphasic wave form  Max intensity 161m at 500 Ohm load   Therapy Documentation Precautions:  Precautions Precautions: Fall, Other (comment) Precaution Comments: check BP on left arm due to occlusion on R side. Restrictions Weight Bearing Restrictions: No  Pain: Pain Assessment Pain Scale: Faces Faces Pain Scale: Hurts whole lot Pain Type: Acute pain Pain Location: Buttocks Pain Orientation: Lower Pain Descriptors / Indicators: Discomfort;Stabbing Pain Onset: With Activity Pain Intervention(s): Repositioned;Medication (See eMAR) Multiple Pain Sites: No ADL: See Care Tool Section for some details of mobility and selfcare  Therapy/Group: Individual Therapy  Brittie Whisnant OTR/L 06/29/2019, 3:54 PM

## 2019-06-29 NOTE — Progress Notes (Signed)
Speech Language Pathology Weekly Progress and Session Note  Patient Details  Name: Adrian [name] MRN: [id number] Date of Birth: [birth year]  Beginning of progress report period: Jun 21, 2019 End of progress report period: Jun 29, 2019  Today's Date: 06/29/2019 SLP Individual Time: 0730-0759 SLP Individual Time Calculation (min): 29 min  Short Term Goals: Week 2: SLP Short Term Goal 1 (Week 2): Pt will consume trials of dysphagia 2 solids with efficient mastication and oral clearance X4 with Mod A cues for use of swallow strategies. SLP Short Term Goal 1 - Progress (Week 2): Progressing toward goal SLP Short Term Goal 2 (Week 2): Pt will consume therapeutic trials of ice and/or thin H2O with adequate oral containment and minimal overt s/sx aspiration with Max A multimodal cues for accurate use of chin tuck maneuver. SLP Short Term Goal 2 - Progress (Week 2): Met SLP Short Term Goal 3 (Week 2): Pt will verbalize at the word level to achieve 30% intelligibility with Max A multimodal cueing for intelligibility strategies. SLP Short Term Goal 3 - Progress (Week 2): Met SLP Short Term Goal 4 (Week 2): Pt communicate basic wants and needs via multimodal means with Mod A cues. SLP Short Term Goal 4 - Progress (Week 2): Met SLP Short Term Goal 5 (Week 2): Pt will sustain attention to functional tasks for 5-7 minutes with Max A multimodal cues. SLP Short Term Goal 5 - Progress (Week 2): Met SLP Short Term Goal 6 (Week 2): Pt will perform 25 reps of EMST exercises to increase breath support and voice with Mod A multimodal cues for accuracy. SLP Short Term Goal 6 - Progress (Week 2): Met    New Short Term Goals: Week 3: SLP Short Term Goal 1 (Week 3): Pt will participate in MBSS to assess potential for liquid and solid advancement. SLP Short Term Goal 2 (Week 3): Pt will consume trials of upgraded solids with functional mastication and full oral clearance with Mod A cues for use of swallow  strategies. SLP Short Term Goal 3 (Week 3): Pt will verbalize at the word level to achieve 50% intelligibility with Mod A multimodal cueing for intelligibility strategies. SLP Short Term Goal 4 (Week 3): Pt will sustain attention to functional tasks for 5-7 minutes with Mod A multimodal cues. SLP Short Term Goal 5 (Week 3): Pt will demonstrate ability to problem solve basic and functional situations with Mod A multimodal cues.  Weekly Progress Updates: Pt is making steady progress and met 5 out of 6  short term goals this reporting period. Pt has demonstrated improved speech intelligibility at the word and short phrase level (~40-50%) with cues to implement pacing/pausing, and increased vocal intensity. He required Moderate assistance for basic cogntive tasks due to impairment impacting his sustained attention, problem solving, and emergent and anticipatory awareness. He continues to present with s/sx of moderate oropharyngeal dysphagia, however his mastication and oral clearance of upgraded solid trials has improved. He has also participated in trials of thin liquids with use of chin tuck with overall minimal s/sx aspiration and a repeat MBSS is indicated.  Pt is consuming puree (dysphagia 1) diet with honey thick liquids. Pt and family education is ongoing. Pt would continue to benefit from skilled ST while inpatient in order to maximize functional independence and reduce burden of care prior to discharge. Anticipate that pt will need 24/7 supervision at discharge in addition to [place] follow up at next level of care.      Intensity:  Minumum of 1-2 x/day, 30 to 90 minutes Frequency: 3 to 5 out of 7 days Duration/Length of Stay: 07/09/19 Treatment/Interventions: Cognitive remediation/compensation;Cueing hierarchy;Dysphagia/aspiration precaution training;Functional tasks;Patient/family education;Environmental controls;Internal/external aids;Speech/Language facilitation;Multimodal communication  approach   Daily Session Skilled Therapeutic Interventions: Pt was seen for skilled ST targeting dysphagia and cognition. Pt's wife present throughout session. Pt's speech also noted to be significantly improved - ~60% intelligible at word and short phrase level with use of pacing. Pt was limited by sacral pain, requiring rest breaks and Mod A verbal cues for encouragement and sustained attention to tasks for 2-3 minute intervals, but accepted trials of upgraded Dys 2 solids (minced/ground), and thin H2O with chin tuck. No overt s/sx aspiration observed throughout intake of solids and liquids. He demonstrate prolonged and weak mastication with Dys 2 solids, but improved since last trial. Trace lingual residue noted, but pt cleared oral cavity with Min A verbal cues for use of lingual sweep. Recommend continue current diet and upgraded trials to work toward advancement. Anticipate pt will be ready to participate in repeat MBSS to reassess oropharyngeal swallow function via instrumental assessment this week. Pt left laying in bed with needs within reach, RN and wife present. Continue per current plan of care.      Pain Pain Assessment Pain Scale: Faces Faces Pain Scale: Hurts whole lot Pain Type: Acute pain Pain Location: Sacrum Pain Descriptors / Indicators: Aching;Burning Pain Onset: On-going Pain Intervention(s): RN made aware;Emotional support  Therapy/Group: Individual Therapy   E  06/29/2019, 6:57 AM         

## 2019-06-29 NOTE — Consult Note (Signed)
Adrian [name] [birth year]  [id number].    Requesting MD: Dr. Claudette Laws Chief Complaint/Reason for Consult: sacral decubitus ulcer  HPI:  This is a 54 yo male who was admitted for CVA.  He was in the hospital for about 10 days.  The family states he was not turned well while in the hospital and developed a decubitus ulcer.  He has since been discharged to rehab and continues to work with therapies.  His decubitus has been getting santyl, but appears to have a necrotic eschar present.  We have been asked to see him for further recommendations.  Minimal history is able to be obtained due to dysarthria.    ROS: ROS: limited due to dysarthria.  Some pain in his buttock.  some right-sided weakness.  Otherwise negative as best as I can get.  Family History  Problem Relation Age of Onset  . Hypertension Mother   . Hypertension Father     Past Medical History:  Diagnosis Date  . Tobacco abuse     History reviewed. No pertinent surgical history.  Social History:  reports that he has been smoking cigarettes. He has been smoking about 4.00 packs per day. He has never used smokeless tobacco. He reports previous alcohol use. He reports that he does not use drugs.  Allergies:  Allergies  Allergen Reactions  . Codeine Other (See Comments)    unknown  . Motrin [Ibuprofen] Other (See Comments)    unknown  . Penicillins Other (See Comments)    unknown    Medications Prior to Admission  Medication Sig Dispense Refill  . atorvastatin (LIPITOR) 80 MG tablet Take 1 tablet (80 mg total) by mouth daily. 30 tablet 0  . buPROPion (WELLBUTRIN) 75 MG tablet Take 2 tablets (150 mg total) by mouth 2 (two) times daily. 60 tablet 0  . clopidogrel (PLAVIX) 75 MG tablet Take 1 tablet (75 mg total) by mouth daily. 30 tablet 0  . gabapentin (NEURONTIN) 250 MG/5ML solution Take 12 mLs (600 mg total) by mouth 3 (three) times daily.  0  . mirtazapine (REMERON) 15 MG tablet Take 1 tablet (15 mg total)  by mouth at bedtime. 30 tablet 2  . nicotine (NICODERM CQ - DOSED IN MG/24 HOURS) 21 mg/24hr patch Place 1 patch (21 mg total) onto the skin daily. 28 patch 0  . pantoprazole sodium (PROTONIX) 40 mg/20 mL PACK Take 20 mLs (40 mg total) by mouth daily. 30 mL 0  . polyethylene glycol (MIRALAX / GLYCOLAX) 17 g packet Take 17 g by mouth daily. 14 each 0  . [EXPIRED] traMADol (ULTRAM) 50 MG tablet Take 1 tablet (50 mg total) by mouth every 6 (six) hours as needed for up to 3 days for moderate pain. 9 tablet 0     Physical Exam: Blood pressure (!) 149/108, pulse 95, temperature 97.9 F (36.6 C), temperature source Oral, resp. rate 16, height 5\' 11"  (1.803 m), weight 66.3 kg, SpO2 97 %. Gen: NAD, laying in bed, WD Abd: soft, NT, ND, +BS, no masses or hernias noted Skin: patient has an unstageable decubitus ulcer with 100% black eschar over the wound.  He does have some surrounding skin excoriation and a small stage II wound lateral to this.  Scissors were used to sharply debride this wound, 3.5x3x3cm area of necrotic tissue.  Some bleeding noted at the base of the wound.  Still with 100% necrotic tissue, but bone palpable, but not visible  No results found for this  or any previous visit (from the past 48 hour(s)). No results found.    Assessment/Plan CVA  Stage 4 decubitus ulcer Bedside debridement complete.  Will continue to clean this up conservatively with santyl and PT hydrotherapy.  No plans for further surgical intervention at this time, certainly not in the OR given his recent CVAs.  Will recheck on Friday with hydrotherapy.   Henreitta Cea, [facility] 06/29/2019, 12:08 PM Please see Amion for pager number during day hours 7:00am-4:30pm or 7:00am -11:30am on weekends

## 2019-06-30 ENCOUNTER — Inpatient Hospital Stay (HOSPITAL_COMMUNITY): Payer: Self-pay

## 2019-06-30 ENCOUNTER — Inpatient Hospital Stay (HOSPITAL_COMMUNITY): Payer: Self-pay | Admitting: Speech Pathology

## 2019-06-30 ENCOUNTER — Inpatient Hospital Stay (HOSPITAL_COMMUNITY): Payer: Self-pay | Admitting: Occupational Therapy

## 2019-06-30 MED ORDER — OXYCODONE HCL ER 10 MG PO T12A
10.0000 mg | EXTENDED_RELEASE_TABLET | Freq: Two times a day (BID) | ORAL | Status: DC
  Administered 2019-06-30 – 2019-07-02 (×5): 10 mg via ORAL
  Filled 2019-06-30 (×5): qty 1

## 2019-06-30 MED ORDER — TIZANIDINE HCL 4 MG PO TABS
4.0000 mg | ORAL_TABLET | Freq: Three times a day (TID) | ORAL | Status: DC
  Administered 2019-06-30 – 2019-07-08 (×23): 4 mg via ORAL
  Filled 2019-06-30 (×24): qty 1

## 2019-06-30 NOTE — Progress Notes (Signed)
Speech Language Pathology Daily Session Note  Patient Details  Name: Adrian [name] MRN: [id number] Date of Birth: May 11, 1965  Today's Date: 06/30/2019 SLP Individual Time: 1132-1205 SLP Individual Time Calculation (min): 33 min  Short Term Goals: Week 3: SLP Short Term Goal 1 (Week 3): Pt will participate in MBSS to assess potential for liquid and solid advancement. SLP Short Term Goal 2 (Week 3): Pt will consume trials of upgraded solids with functional mastication and full oral clearance with Mod A cues for use of swallow strategies. SLP Short Term Goal 3 (Week 3): Pt will verbalize at the word level to achieve 50% intelligibility with Mod A multimodal cueing for intelligibility strategies. SLP Short Term Goal 4 (Week 3): Pt will sustain attention to functional tasks for 5-7 minutes with Mod A multimodal cues. SLP Short Term Goal 5 (Week 3): Pt will demonstrate ability to problem solve basic and functional situations with Mod A multimodal cues.  Skilled Therapeutic Interventions: Pt was seen for skilled ST targeting dysphagia and speech. Pt's wife and son present for ~1/3 of session. Pt still limited by pain, however improved participation and ability to sustain attention to functional tasks today in comparison to yesterday. Following oral care via suction toothbrush, pt accepted tsp trials of thin H2O with and without use of chin tuck strategy. Pt exhibited 1 cough response following a tsp of thin WITHOUT use of chin tuck (<25% trials). Pt also accepted trials of upgraded dysphagia 2 (minced) solids with mildly prolonged but largely functional mastication. Minimal oral residue observed, which could be cleared with Min A verbal cues for use of lingual sweep and additional volitional swallows. Would recommend continue current diet for now and repeat MBSS tomorrow (07/01/19) to reassess oropharyngeal swallow function via instrumental assessment with both solids and liquids. Moderate verbal cues for use  of primarily slower rate/pacing required throughout sentence level communications in order to increase intelligibility to ~60% during session. Pt also benefits from cues for increased vocal intensity. Pt left laying in bed with alarm set and needs within reach, family still present and provided with several cups of honey thick liquids of pt's preferred flavors to encourage increased liquid intake. Continue per current plan of care.       Pain Pain Assessment Pain Scale: Faces Faces Pain Scale: Hurts even more Pain Type: Acute pain Pain Location: Sacrum Pain Orientation: Mid Pain Descriptors / Indicators: Aching Pain Frequency: Constant Pain Onset: On-going Pain Intervention(s): Rest;Repositioned  Therapy/Group: Individual Therapy  [name] 06/30/2019, 6:57 AM

## 2019-06-30 NOTE — Patient Care Conference (Signed)
Inpatient RehabilitationTeam Conference and Plan of Care Update Date: 06/30/2019   Time: 3:40 PM    Patient Name: Adrian [name]      Medical Record Number: [id number]  Date of Birth: 08-16-1965 Sex: Male         Room/Bed: 4W16C/4W16C-02 Payor Info: Payor: MEDICAID POTENTIAL / Plan: MEDICAID POTENTIAL / Product Type: *No Product type* /    Admit Date/Time:  06/12/2019  4:39 PM  Primary Diagnosis:  Basilar artery occlusion  Patient Active Problem List   Diagnosis Date Noted  . Blood pressure increase diastolic   . Dysphagia due to recent cerebrovascular accident   . Lumbago   . Orthostasis   . 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 Depressive reaction   . Labile blood pressure   . Dysphagia, post-stroke   . Cardioembolic stroke ([place]) 68/34/1962  . Chronic right-sided low back pain with right-sided sciatica   . Tobacco abuse   . Leukocytosis   . Prediabetes   . CVA (cerebral vascular accident) ([place]) 06/02/2019  . Basilar artery occlusion 06/02/2019    Expected Discharge Date: Expected Discharge Date: 07/09/19  Team Members Present: Physician leading conference: Dr. Alysia Penna Care Coodinator Present: Nestor Lewandowsky, RN, BSN, CRRN;Christina Sampson Goon, BSW Nurse Present: Other (comment)(Tomeka Pugh, LPN) PT Present: Michaelene Song, PT OT Present: Clyda Greener, OT SLP Present: Jettie Booze, CF-SLP PPS Coordinator present : Ileana Ladd, Burna Mortimer, SLP     Current Status/Progress Goal Weekly Team Focus  Bowel/Bladder   Patient is incontinent of bladder (adult briefs). Continent of bowel.  Maintain bowel and bladder continence.  Toilet Q2-3H and assess PRN   Swallow/Nutrition/ Hydration   dysphagia 1 (puree), honey, trials of thin with chin tuck going well, more accurate with chin tuck this week, repeat MBSS tomorrow 5/13  Min  tolerance current diet, trials of advanced solids and liquids, repeat MBSS   ADL's   Total assist  for selfcare tasks supine to sit.  Total +2 for sit to stand with total assist for squat pivot transfers.  Increased tone in the left biceps with max hand over hand for integrated use.  He uses the RUE at a gross assist level with selfcare tasks.  Limited by buttocks pain, max assist for static sitting balance.  downgraded to max to total assist  selfcare retraining, balance retraining, neuromuscular re-education, NMES, pt/family education, DME education   Mobility   max assist rolling L, max/total assist rolling R; total assist supine<>sit, +2 max assist sit<>stand with increased quad muscle activation, total assist of 1 squat pivot to/from w/c, gait ~77ft in litegait harness with +3 assist for B LE mang[place] and litegait management  minA sitting balance, minA/modA sit to stand and transfers, maxA ambulation with therapist, Supervision WC  B LE tone management, B LE stretching, upright activity tolerance, pt/family education, transfer training, standing tolerance, B LE strengthening, sitting balance/trunk control, progression to ambulation   Communication   Mod A for intelligibility phrase/sentence level, ~50-60% now with use of pacing and increased vocal intensity, greatly improved over last week  Mod A  intelligibility phrase and sentence level, independence with use of strategies and correcting verbal errors/communication breakdowns   Safety/Cognition/ Behavioral Observations  Mod-Max A, limited by pain this week  Mod A  sustained attention, functional basic problem solving, participation   Pain   Patient c/o pain 8-10/10 at site of sacral wound.  Tylenol and Oxycodone scheduled and PRN  Patient will maintain pain level less  than 4 with increased activity tolerance  Assess pain Qshift and PRN.  Pharmacological, non-pharmacologial, repositioning, offloading interventions.   Skin   Unstageable wound to coccyx, DTI to left heel.  Psoriasis scattered throughout, but mainly to extremeties, face and neck   Patient will maintain skin integrity and avoid furthur breakdown or infection  Assess skin Qshift and PRN, wound care, offloading, specialty mattress    Rehab Goals Patient on target to meet rehab goals: No Rehab Goals Revised: Goals downgraded to MAX A and wife and son are aware of discharge goals *See Care Plan and progress notes for long and short-term goals.     Barriers to Discharge  Current Status/Progress Possible Resolutions Date Resolved   Nursing      Wound care/Pressure injury prevention  Hydrotherapy Air Mattress and off loading   Reinforce need for turning and included family into care regimen       PT                    OT                  SLP                SW Home environment access/layout;Decreased caregiver support Trailer with steps to entry/wife working on ramp for entry/wife has physical problems and is disabled Son installing ramp to home, moving bed for hospital bed in home and family working on 24/7 caregivers [facility] services requested Medicaid application initiated Hospital bed, hoyer lift/pad ordered for discharge       Discharge Planning/Teaching Needs:  Home with wife, daughter and son  ADLs, diet, medications, transfers, skin care,  etc.   Team Discussion:  Slow progression, patient wants to go home today. PO solid food intake improved however hydration is poor. PEG on backorder; encourage po fluids with MBS 07/01/19. Patient refusing to have PEG placed when available. Family signed off to assist with feeds  Revisions to Treatment Plan:  Increase oral medications for tone management and hold on Botox; reassess need next week    Medical Summary Current Status: Improved caloric intake but fluid intake still poor, modified barium swallow tomorrow Weekly Focus/Goal: Repeat modified barium swallow, hope is to upgrade fluids to nectar and increase intake.  Barriers to Discharge: Medical stability;Nutrition means;Wound care  Barriers to Discharge Comments:  Patient has poor tolerance of a seated position.  Still assessing whether patient can maintain hydration p.o. Possible Resolutions to Barriers: Repeat modified barium swallow, if unable to drink at least 1000 mL of fluid per day we will need to place PEG.  Will need to stop Plavix for 5 days prior to PEG   Continued Need for Acute Rehabilitation Level of Care: The patient requires daily medical management by a physician with specialized training in physical medicine and rehabilitation for the following reasons: Direction of a multidisciplinary physical rehabilitation program to maximize functional independence : Yes Medical management of patient stability for increased activity during participation in an intensive rehabilitation regime.: Yes Analysis of laboratory values and/or radiology reports with any subsequent need for medication adjustment and/or medical intervention. : Yes   I attest that I was present, lead the team conference, and concur with the assessment and plan of the team.   Chana Bode B 06/30/2019, 3:40 PM

## 2019-06-30 NOTE — Plan of Care (Signed)
  Problem: Consults Goal: RH STROKE PATIENT EDUCATION Description: See Patient Education module for education specifics  Outcome: Progressing   Problem: RH BOWEL ELIMINATION Goal: RH STG MANAGE BOWEL WITH ASSISTANCE Description: STG Manage Bowel with mod Assistance. Outcome: Progressing Goal: RH STG MANAGE BOWEL W/MEDICATION W/ASSISTANCE Description: STG Manage Bowel with Medication with mod Assistance. Outcome: Progressing   Problem: RH BLADDER ELIMINATION Goal: RH STG MANAGE BLADDER WITH ASSISTANCE Description: STG Manage Bladder With mod Assistance Outcome: Progressing   Problem: RH SKIN INTEGRITY Goal: RH STG SKIN FREE OF INFECTION/BREAKDOWN Outcome: Progressing Goal: RH STG MAINTAIN SKIN INTEGRITY WITH ASSISTANCE Description: STG Maintain Skin Integrity With mod Assistance. Outcome: Progressing Goal: RH STG ABLE TO PERFORM INCISION/WOUND CARE W/ASSISTANCE Description: STG Able To Perform Incision/Wound Care With max Assistance. Outcome: Progressing   Problem: RH SAFETY Goal: RH STG ADHERE TO SAFETY PRECAUTIONS W/ASSISTANCE/DEVICE Description: STG Adhere to Safety Precautions With min Assistance/Device. Outcome: Progressing   Problem: RH COGNITION-NURSING Goal: RH STG USES MEMORY AIDS/STRATEGIES W/ASSIST TO PROBLEM SOLVE Description: STG Uses Memory Aids/Strategies With min Assistance to Problem Solve. Outcome: Progressing Goal: RH STG ANTICIPATES NEEDS/CALLS FOR ASSIST W/ASSIST/CUES Description: STG Anticipates Needs/Calls for Assist With min Assistance/Cues. Outcome: Progressing   Problem: RH PAIN MANAGEMENT Goal: RH STG PAIN MANAGED AT OR BELOW PT'S PAIN GOAL Description: Less than 3 out of 10 Outcome: Progressing   Problem: RH KNOWLEDGE DEFICIT Goal: RH STG INCREASE KNOWLEDGE OF HYPERTENSION Description: Patient/family will be able to identify medication to treat HTN along with 2 other nonpharmalogical interventions Outcome: Progressing Goal: RH STG INCREASE  KNOWLEDGE OF DYSPHAGIA/FLUID INTAKE Description: Patient will be able to demonstrate understanding of thickened fluid necessity Outcome: Progressing Goal: RH STG INCREASE KNOWLEGDE OF HYPERLIPIDEMIA Outcome: Progressing Goal: RH STG INCREASE KNOWLEDGE OF STROKE PROPHYLAXIS Description: Patient/Family will be able to verbalize to 3 medications for stroke prevention post discharge  Outcome: Progressing   

## 2019-06-30 NOTE — Progress Notes (Signed)
Physical Therapy Wound Treatment Patient Details  Name: Adrian [name] MRN: [id number] Date of Birth: 10-30-1965  Today's Date: 06/30/2019 PT Individual Time: 9390-3009 PT Individual Time Calculation (min): 40 min   Patient Active Problem List   Diagnosis Date Noted  . Blood pressure increase diastolic   . Dysphagia due to recent cerebrovascular accident   . Lumbago   . Orthostasis   . 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 Depressive reaction   . Labile blood pressure   . Dysphagia, post-stroke   . Cardioembolic stroke ([place]) 23/30/0762  . Chronic right-sided low back pain with right-sided sciatica   . Tobacco abuse   . Leukocytosis   . Prediabetes   . CVA (cerebral vascular accident) ([place]) 06/02/2019  . Basilar artery occlusion 06/02/2019   Past Medical History:  Diagnosis Date  . Tobacco abuse    History reviewed. No pertinent surgical history.  Subjective  Subjective: Pt is very worried that hydrotherapy will hurt Patient and Family Stated Goals: pain reduction Prior Treatments: dressing change with Santyl  Pain Score: Pain Score: 9   Objective  Cognition: Impaired:  Communication: Impaired:  Mobility: Impaired:  ROM: Impaired:  Sensation: WFL by patient report and not tested Protective sensation (10g): not tested Signs of venous insufficiency:N/A Signs of arterial insufficiency: N/A  Wound Assessment  Pressure Injury 06/12/19 Coccyx Medial;Upper Unstageable - Full thickness tissue loss in which the base of the injury is covered by slough (yellow, tan, gray, green or brown) and/or eschar (tan, brown or black) in the wound bed. wound is 2cm long by 0.5  (Active)  Wound Image   06/30/19 1500  Dressing Type ABD;Gauze (Comment);Barrier Film (skin prep);Moist to moist 06/30/19 1500  Dressing Changed;New drainage 06/30/19 1500  Dressing Change Frequency Daily 06/30/19 1500  State of Healing Eschar 06/30/19 1500  Site / Wound  Assessment Black;Brown;Painful 06/30/19 1500  % Wound base Red or Granulating 0% 06/30/19 1500  % Wound base Black/Eschar 100% 06/30/19 1500  Peri-wound Assessment Erythema (blanchable);Maceration 06/30/19 1500  Wound Length (cm) 5 cm 06/30/19 1500  Wound Width (cm) 3.2 cm 06/30/19 1500  Wound Depth (cm) 2.3 cm 06/30/19 1500  Wound Surface Area (cm^2) 16 cm^2 06/30/19 1500  Wound Volume (cm^3) 36.8 cm^3 06/30/19 1500  Tunneling (cm) 0.5 06/23/19 0846  Margins Unattached edges (unapproximated) 06/30/19 1500  Drainage Amount Moderate 06/30/19 1500  Drainage Description Odor;Serosanguineous 06/30/19 1500  Treatment Debridement (Selective);Hydrotherapy (Pulse lavage);Packing (Saline gauze);Other (Comment) 06/30/19 1500   Santyl applied to necrotic tissue   Pressure Injury 06/12/19 Buttocks Left Unstageable - Full thickness tissue loss in which the base of the injury is covered by slough (yellow, tan, gray, green or brown) and/or eschar (tan, brown or black) in the wound bed. yellow covered wound bed (Active)  Dressing Type ABD;Barrier Film (skin prep);Moist to moist 06/30/19 1500  Dressing Changed;New drainage 06/30/19 1500  Dressing Change Frequency Daily 06/30/19 1500  State of Healing Other (Comment) 06/30/19 1500  Site / Wound Assessment Yellow 06/30/19 1500  % Wound base Yellow/Fibrinous Exudate 100% 06/30/19 1500  Peri-wound Assessment Erythema (non-blanchable);Pink 06/30/19 1500  Wound Length (cm) 1.2 cm 06/30/19 1500  Wound Width (cm) 1.8 cm 06/30/19 1500  Wound Depth (cm) 0 cm 06/27/19 0100  Wound Surface Area (cm^2) 2.16 cm^2 06/30/19 1500  Wound Volume (cm^3) 0 cm^3 06/27/19 0100  Margins Unattached edges (unapproximated) 06/30/19 1500  Drainage Amount Minimal 06/30/19 1500  Drainage Description Serous 06/30/19 1500  Treatment Hydrotherapy (Pulse  lavage);Other (Comment) 06/30/19 1500   Hydrotherapy Pulsed lavage therapy - wound location: Sacral and L buttock wounds Pulsed  Lavage with Suction (psi): 12 psi Pulsed Lavage with Suction - Normal Saline Used: 1000 mL Pulsed Lavage Tip: Tip with splash shield Selective Debridement Selective Debridement - Location: sacral  Selective Debridement - Tools Used: Forceps;Scalpel;Scissors Selective Debridement - Tissue Removed: gray stringy and black eschar from base of wound   Wound Assessment and Plan  Wound Therapy - Assess/Plan/Recommendations Wound Therapy - Clinical Statement: Pt is s/p bedside debridement of sacral wound by surgical PA, wound bed continues to be covered in black/gray eschar material. Wound will benefit from daily pulsed lavage and debridement to decreased bioburden to promote healing.  Wound Therapy - Functional Problem List: decreased mobility, and pt preference for lying on his back with minimal wound offloading Factors Delaying/Impairing Wound Healing: Immobility;Multiple medical problems Hydrotherapy Plan: Debridement;Patient/family education;Pulsatile lavage with suction Wound Therapy - Frequency: 6X / week Wound Therapy - Follow Up Recommendations: [place] Wound Plan: see above  Wound Therapy Goals- Improve the function of patient's integumentary system by progressing the wound(s) through the phases of wound healing (inflammation - proliferation - remodeling) by: Decrease Necrotic Tissue to: 80 Increase Granulation Tissue to: 20 Decrease Length/Width/Depth by (cm): 5 Goals/treatment plan/discharge plan were made with and agreed upon by patient/family: Yes Time For Goal Achievement: 7 days Wound Therapy - Potential for Goals: Good  Goals will be updated until maximal potential achieved or discharge criteria met.  Discharge criteria: when goals achieved, discharge from hospital, MD decision/surgical intervention, no progress towards goals, refusal/missing three consecutive treatments without notification or medical reason.  Adelaide Pfefferkorn B. Migdalia Dk PT, DPT Acute Rehabilitation  Services Pager [phone] Office [phone]  [place] 06/30/2019, 3:32 PM

## 2019-06-30 NOTE — Progress Notes (Signed)
Occupational Therapy Session Note  Patient Details  Name: Adrian [name] MRN: [id number] Date of Birth: [birth year]  Today's Date: 06/30/2019 OT Individual Time: 1300-1349 OT Individual Time Calculation (min): 49 min    Short Term Goals: Week 3:  OT Short Term Goal 1 (Week 3): Pt will self feed with mod assist using the RUE with AE for 25% of meal. OT Short Term Goal 2 (Week 3): Pt will complete UB bathing with mod assist in supported sitting position. OT Short Term Goal 3 (Week 3): Pt will roll in the bed to the left side with max assist to help with supine to sit EOB or for donning LB clothing.  Skilled Therapeutic Interventions/Progress Updates:    Pt in bed to start session refusing to participate.  Persuaded pt to participate in therapy with family supportive of him to participate as well.  He needed total assist in sidelying to donn his shorts with total assist for transition to sitting.  Pt cursing at therapist at times secondary to increased pain and not wanting to get up OOB.  He was able to complete squat pivot transfer to the wheelchair with total assist.  Increased adductor and hamstrings tone noted in bilateral LEs during transfer.  He was then taken to the therapy gym where the standing frame was utilized for standing for one interval of approximately 10 mins.  Worked on having him initiate knee extension in standing as well as hip extension with overall max assist.  He needed max assist to maintain upright trunk in standing with cervical extension.  Pt non-verbal through most of standing and would not answer therapist's questions with regards to pain to having any pain in his buttocks when standing.  After standing for the first interval he requested to go back to his room.  Total assist was needed for partial standing in order to remove standing frame belt.  He was then rolled back to the room in recliner position in tilt in space wheelchair with total assist for squat pivot transfer back  to the bed.  Finished session with pt in sidelying with call button and phone in reach.    Therapy Documentation Precautions:  Precautions Precautions: Fall, Other (comment) Precaution Comments: check BP on left arm due to occlusion on R side. Restrictions Weight Bearing Restrictions: No  Pain: Pain Assessment Pain Scale: Faces Pain Score: 9  Faces Pain Scale: Hurts even more Pain Type: Acute pain Pain Location: Buttocks Pain Orientation: Lower Pain Descriptors / Indicators: Discomfort;Grimacing Pain Onset: With Activity Pain Intervention(s): Distraction;Emotional support ADL: See Care Tool Section for some details of mobility and selfcare  Therapy/Group: Individual Therapy  Sarea Fyfe OTR/L 06/30/2019, 1:57 PM

## 2019-06-30 NOTE — Progress Notes (Addendum)
[facility] PHYSICAL MEDICINE & REHABILITATION PROGRESS NOTE   Subjective/Complaints:  Started with hydrotherapy for sacral decubitus. Pain limiting hydrotherapy and other therapies Tolerated oxycodone 58m yesterday   ROS: Patient denies CP, SOB N/V/D  + buttocks pain   Objective:   No results found. No results for input(s): WBC, HGB, HCT, PLT in the last 72 hours. No results for input(s): NA, K, CL, CO2, GLUCOSE, BUN, CREATININE, CALCIUM in the last 72 hours.  Intake/Output Summary (Last 24 hours) at 06/30/2019 0953 Last data filed at 06/30/2019 0345 Gross per 24 hour  Intake 1240 ml  Output --  Net 1240 ml     Physical Exam: Vital Signs Blood pressure (!) 156/86, pulse 86, temperature 98.2 F (36.8 C), temperature source Oral, resp. rate 18, height _0  (1.803 m), weight 66.3 kg, SpO2 97 %.  General: No acute distress Mood and affect are appropriate Heart: Regular rate and rhythm no rubs murmurs or extra sounds Lungs: Clear to auscultation, breathing unlabored, no rales or wheezes Abdomen: Positive bowel sounds, soft nontender to palpation, nondistended Extremities: No clubbing, cyanosis, or edema Skin: No evidence of breakdown, no evidence of rash  Skin:s  facial dermatitis improved    Neurologic: severe dysarthria. Cranial nerves II through XII intact, motor strength is 0/5 in Left  deltoid, bicep, tricep, grip, hip flexor, knee extensors, ankle dorsiflexor and plantar flexor 3/5 R biceps, triceps, 2- finger flexion and ext, trace R hip knee ext synergy Sensory exam normal sensation to light touch and proprioception in bilateral upper and lower extremities. LUE biceps tone 3/4. 1/4 wrist/finger flexors. Extensor tone 1-2/4 in LE's.  Cerebellar exam NA d/t weakness   Musculoskeletal:  No joint swelling  Photo 06/21/2019    06/28/2019     Assessment/Plan: 1. 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  Physiatrist is providing close team supervision and 24 hour management of active medical problems listed below.  Physiatrist and rehab team continue to assess barriers to discharge/monitor patient progress toward functional and medical goals  Care Tool:  Bathing        Body parts bathed by helper: Right arm, Left arm, Chest, Abdomen, Front perineal area, Buttocks, Right upper leg, Left upper leg, Right lower leg, Left lower leg, Face     Bathing assist Assist Level: Dependent - Patient 0%     Upper Body Dressing/Undressing Upper body dressing   What is the patient wearing?: Pull over shirt    Upper body assist Assist Level: 2 Helpers(sitting unsupported EOB)    Lower Body Dressing/Undressing Lower body dressing      What is the patient wearing?: Pants     Lower body assist Assist for lower body dressing: Dependent - Patient 0%     Toileting Toileting    Toileting assist Assist for toileting: 2 Helpers     Transfers Chair/bed transfer  Transfers assist  Chair/bed transfer activity did not occur: Safety/medical concerns  Chair/bed transfer assist level: Total Assistance - Patient < 25%     Locomotion Ambulation   Ambulation assist   Ambulation activity did not occur: Safety/medical concerns  Assist level: Total Assistance - Patient < 25% Assistive device: Lite Gait Max distance: 323f  Walk 10 feet activity   Assist  Walk 10 feet activity did not occur: Safety/medical concerns  Assist level: Total Assistance - Patient < 25% Assistive device: Lite Gait  Walk 50 feet activity   Assist Walk 50 feet with 2 turns activity did not occur: Safety/medical concerns         Walk 150 feet activity   Assist Walk 150 feet activity did not occur: Safety/medical concerns         Walk 10 feet on uneven surface  activity   Assist Walk 10 feet on uneven surfaces  activity did not occur: Safety/medical concerns         Wheelchair     Assist     Wheelchair activity did not occur: Safety/medical concerns         Wheelchair 50 feet with 2 turns activity    Assist    Wheelchair 50 feet with 2 turns activity did not occur: Safety/medical concerns       Wheelchair 150 feet activity     Assist  Wheelchair 150 feet activity did not occur: Safety/medical concerns       Blood pressure (!) 156/86, pulse 86, temperature 98.2 F (36.8 C), temperature source Oral, resp. rate 18, height _0  (1.803 m), weight 66.3 kg, SpO2 97 %.  Medical Problem List and Plan: 1.Dysarthria/quadriparesis/aphasiasecondary to multifocal infarcts mostly posterior circulation but also bilateral anterior circulation cardioembolic pattern as well as innominate artery occlusion, left ICA thrombus versus soft plaque, right V4 and basal artery occlusive thrombus  Continue CIR, PT,  OT,   Team conference today please see physician documentation under team conference tab, met with team  to discuss problems,progress, and goals. Formulized individual treatment plan based on medical history, underlying problem and comorbidities. 2. Antithrombotics: -DVT/anticoagulation:Lovenox  -antiplatelet therapy: Aspirin 325 mg and Plavix 75 mg daily- will not hold plavix until IR indicates PEG is scheduled  3. Pain Management:Tramadol as needed  Decubitus pain, off tramadol, ineffective, started oxyIR 15m Q6 prn, given hydrotherapy will add oxycontin 17mq 12 dose at 8 am to allow effect prior to hydrotherapy   -encourage re-positioning/OOB as much as possible, supportive seating     dental pain ? improved- left upper molar ,  No sign of abscess, + gingivitis, unable to do orthopantogram because of poor sitting balance 4. Mood:Wellbutrin 150 mg twice daily, may need trial of Nuedexta, expect PBA given extensive posterior > anterior circulation  infarcts -antipsychotic agents: Seroquel 25 mg 3 times daily as needed 5. Neuropsych: This patientis notcapable of making decisions on hisown behalf.  6. Skin/Wound Care:Routine skin checks- sacrococcygeal eschar, , cont foam with collagenase on eschar , if no better in a week may need debridement- ask Gen Surg to eval - cont collagenase daily , MASD improved  Seborrheic dermatitis - improved on valisone  7. Fluids/Electrolytes/Nutrition:Routine in and outs  BMP within acceptable range on 4/26 8. Post stroke dysphagia. Dysphagia #1 honey thick liquids, advance diet as tolerated.    Consulted IR for PEG placement -national shortage of PEG tubes awaiting shipment.  Continue IVF, taking in solids fairly well. Struggles with liquids (honey) Pt now does not want feeding tube discuessed need to increase the fluid intake  9. Orthostatic hypotension. Monitor with increased mobility Vitals:   06/29/19 2009 06/30/19 0342  BP: (!) 154/79 (!) 156/86  Pulse: 90 86  Resp: 16 18  Temp: 98.1 F (36.7 C) 98.2 F (36.8 C)  SpO2: 98% 97%   Elevated add BB  10. Hyperlipidemia. Lipitor 11. Tobacco abuse. NicoDerm patch.  Counseled when possible 12. Leukocytosis. -Per CT abd  RLL atelectasis vs PNA, given pt afebrile , WBCs elevated but  stable     5/8 no new cough, afebrile  -encourage OOB, aspriation precautions. Pt/wife aware  -continue to monitor LOS: 18 days A FACE TO [facility]field E Romano Stigger 06/30/2019, 9:53 AM

## 2019-06-30 NOTE — Progress Notes (Signed)
Physical Therapy Session Note  Patient Details  Name: Adrian [name] MRN: [id number] Date of Birth: September 28, 1965  Today's Date: 06/30/2019 PT Individual Time: 1000-1030 and 1415-1500 PT Individual Time Calculation (min): 30 min and 45 min    Short Term Goals: Week 3:  PT Short Term Goal 1 (Week 3): Pt will perform supine<>sit with max assist of 1 PT Short Term Goal 2 (Week 3): Pt wil perform bed<>chair transfer with max assist of 1 PT Short Term Goal 3 (Week 3): Pt will perform sit<>stand with max assist of 1 using LRAD PT Short Term Goal 4 (Week 3): Pt will ambulate at least 22ft using LRAD with +2 max assist  Skilled Therapeutic Interventions/Progress Updates:    Session 1: Pt sidelying in bed upon PT arrival, agreeable to therapy tx and reports pain in buttocks area from sacral wound. Pt declining OOB activity this session secondary to pain from wound, just received hydrotherapy and reports "I am staying here." With family present discussed home set up and w/c evaluation that will be on Thursday, asked son to measure any narrow doorways. With pt sidelying in bed performed B LE strengthening and neuro re-ed to perform the following exercises 2 x 10 each - hip flexion/extension AROM bilaterally, hip extension against manual resistance bilaterally, knee extension AROM L LE, and knee extension against manual resistance R LE. Therapist also provided education this session on tone/spasticity impairments and discussed benefits of botox in managing this. Pt left in care of family, sidelying with needs in reach.   Session 2: Pt supine in bed upon PT arrival, continues to report sacral wound pain throughout session, readjusted and limited certain positions for pain relief/management, RN just provided medication. Pt willing to work on standing/walking but does not want to sit in w/c due to buttocks pain. Pt transferred to sitting EOB with total assist. Pt performed x 5 sit<>stands this session from edge of mat  with max+2 assist, in standing pt able to maintain standing position with max +2 assist using 3 musketeers technique for UE support - in standing worked on B hip/knee extension, weightbearing, midline and upright posture with facilitation as needed for better alignment. Pt ambulated x 2 ft forwards and then back with total assist +2, limited by flexor/adductor tone. When standing pt able to maintain position for 1-3 min bouts each time. Between standing bouts worked on static/dynamic sitting balance. Attempted to perform sit<>side sit on R elbow however pt reports increased pain, unable to tolerate. Pt reports feeling tired, ready to lay down. Pt declines further bed level exercises and begins to drift to sleep while therapist providing education to family. Pt left positioned in R sidelying for pressure relief, needs in reach and family present. Missed 15 minutes of skilled therapy tx secondary to pain and fatigue.     Therapy Documentation Precautions:  Precautions Precautions: Fall, Other (comment) Precaution Comments: check BP on left arm due to occlusion on R side. Restrictions Weight Bearing Restrictions: No    Therapy/Group: Individual Therapy  Cresenciano Genre, PT, DPT, CSRS 06/30/2019, 8:18 AM

## 2019-06-30 NOTE — Progress Notes (Signed)
Interventional Radiology Brief Note:  Shipment of G-tubes has arrived.  Called to rehab to begin planning possible gastrostomy tube placement.  Since initial assessment, patient has increased his oral intake, however not likely meeting 100% estimated needs.  Plan was to hold Plavix for 5 days prior to placement.  Team conference expected today per PA.  IR remains available once final decision made re: placement and will anticipate procedure after 5 day Plavix hold if still warranted by patient/medical team.   Loyce Dys, MS RD PA-C

## 2019-06-30 NOTE — Progress Notes (Signed)
Patient refuses to wear prevalon boots while in bed. Patient also refuses to offload heels. Patient and family educated to the need for the boots and to offload heels. Family understands this need and tries to reinforce the patient but patient continues to refuse.

## 2019-07-01 ENCOUNTER — Inpatient Hospital Stay (HOSPITAL_COMMUNITY): Payer: Self-pay | Admitting: Physical Therapy

## 2019-07-01 ENCOUNTER — Encounter (HOSPITAL_COMMUNITY): Payer: Self-pay

## 2019-07-01 ENCOUNTER — Inpatient Hospital Stay (HOSPITAL_COMMUNITY): Payer: Medicaid Other

## 2019-07-01 ENCOUNTER — Inpatient Hospital Stay (HOSPITAL_COMMUNITY): Payer: Self-pay | Admitting: Occupational Therapy

## 2019-07-01 NOTE — Progress Notes (Signed)
[facility] PHYSICAL MEDICINE & REHABILITATION PROGRESS NOTE   Subjective/Complaints:  Discussed need to wear PR AFO at night, left heel pressure area.  See below. Patient states that nursing is not offering to put a boot on although they have documented that they have. Discussed swallowing evaluation with speech therapist.  Patient doing better upgraded to thin liquids  ROS: Patient denies CP, SOB N/V/D  + buttocks pain   Objective:   No results found. No results for input(s): WBC, HGB, HCT, PLT in the last 72 hours. No results for input(s): NA, K, CL, CO2, GLUCOSE, BUN, CREATININE, CALCIUM in the last 72 hours.  Intake/Output Summary (Last 24 hours) at 07/01/2019 1007 Last data filed at 07/01/2019 0900 Gross per 24 hour  Intake 1770.81 ml  Output 550 ml  Net 1220.81 ml     Physical Exam: Vital Signs Blood pressure (!) 167/88, pulse 84, temperature 97.8 F (36.6 C), resp. rate 19, height 5\' 11"  (1.803 m), weight 66.3 kg, SpO2 97 %.   General: No acute distress Mood and affect are appropriate Heart: Regular rate and rhythm no rubs murmurs or extra sounds Lungs: Clear to auscultation, breathing unlabored, no rales or wheezes Abdomen: Positive bowel sounds, soft nontender to palpation, nondistended Extremities: No clubbing, cyanosis, or edema Skin: left heel pressure area     Skin:s  facial dermatitis improved    Neurologic: severe dysarthria. Cranial nerves II through XII intact, motor strength is 0/5 in Left  deltoid, bicep, tricep, grip, hip flexor, knee extensors, ankle dorsiflexor and plantar flexor 3/5 R biceps, triceps, 2- finger flexion and ext, trace R hip knee ext synergy Sensory exam normal sensation to light touch and proprioception in bilateral upper and lower extremities. LUE biceps tone 3/4. 1/4 wrist/finger flexors. Extensor tone 1-2/4 in LE's.  Cerebellar exam NA d/t weakness   Musculoskeletal:  No joint swelling  Photo 06/21/2019    06/28/2019      Assessment/Plan: 1. 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  Physiatrist is providing close team supervision and 24 hour management of active medical problems listed below.  Physiatrist and rehab team continue to assess barriers to discharge/monitor patient progress toward functional and medical goals  Care Tool:  Bathing        Body parts bathed by helper: Right arm, Left arm, Chest, Abdomen, Front perineal area, Buttocks, Right upper leg, Left upper leg, Right lower leg, Left lower leg, Face     Bathing assist Assist Level: Dependent - Patient 0%     Upper Body Dressing/Undressing Upper body dressing   What is the patient wearing?: Pull over shirt    Upper body assist Assist Level: 2 Helpers(sitting unsupported EOB)    Lower Body Dressing/Undressing Lower body dressing      What is the patient wearing?: Pants     Lower body assist Assist for lower body dressing: Dependent - Patient 0%     Toileting Toileting    Toileting assist Assist for toileting: 2 Helpers     Transfers Chair/bed transfer  Transfers assist  Chair/bed transfer activity did not occur: Safety/medical concerns  Chair/bed transfer assist level: Total Assistance - Patient < 25%(squat pivot)     Locomotion Ambulation   Ambulation assist   Ambulation activity did not occur: Safety/medical concerns  Assist level: Total Assistance - Patient < 25% Assistive device: Lite Gait Max distance: 84ft  Walk 10 feet activity   Assist  Walk 10 feet activity did not occur: Safety/medical concerns  Assist level: Total Assistance - Patient < 25% Assistive device: Lite Gait   Walk 50 feet activity   Assist Walk 50 feet with 2 turns activity did not occur: Safety/medical concerns         Walk 150 feet activity   Assist Walk 150 feet  activity did not occur: Safety/medical concerns         Walk 10 feet on uneven surface  activity   Assist Walk 10 feet on uneven surfaces activity did not occur: Safety/medical concerns         Wheelchair     Assist     Wheelchair activity did not occur: Safety/medical concerns         Wheelchair 50 feet with 2 turns activity    Assist    Wheelchair 50 feet with 2 turns activity did not occur: Safety/medical concerns       Wheelchair 150 feet activity     Assist  Wheelchair 150 feet activity did not occur: Safety/medical concerns       Blood pressure (!) 167/88, pulse 84, temperature 97.8 F (36.6 C), resp. rate 19, height 5\' 11"  (1.803 m), weight 66.3 kg, SpO2 97 %.  Medical Problem List and Plan: 1.Dysarthria/quadriparesis/aphasiasecondary to multifocal infarcts mostly posterior circulation but also bilateral anterior circulation cardioembolic pattern as well as innominate artery occlusion, left ICA thrombus versus soft plaque, right V4 and basal artery occlusive thrombus  Continue CIR, PT,  OT, speech therapy Hold off on PEG for now until we could see how he does with liquids  2. Antithrombotics: -DVT/anticoagulation:Lovenox  -antiplatelet therapy: Aspirin 325 mg and Plavix 75 mg daily- will not hold plavix until IR indicates PEG is scheduled  3. Pain Management:Tramadol as needed  Decubitus pain, off tramadol, ineffective, started oxyIR 5mg  Q6 prn, given hydrotherapy will add oxycontin 10mg  q 12 dose at 8 am to allow effect prior to hydrotherapy   -encourage re-positioning/OOB as much as possible, supportive seating     dental pain ? improved- left upper molar ,  No sign of abscess, + gingivitis, unable to do orthopantogram because of poor sitting balance 4. Mood:Wellbutrin 150 mg twice daily, may need trial of Nuedexta, expect PBA given extensive posterior > anterior circulation infarcts -antipsychotic agents:  Seroquel 25 mg 3 times daily as needed 5. Neuropsych: This patientis notcapable of making decisions on hisown behalf.  6. Skin/Wound Care:Routine skin checks- sacrococcygeal eschar, , cont foam with collagenase on eschar , appreciate general surgery note- cont collagenase daily , MASD improved  Seborrheic dermatitis - improved on valisone  7. Fluids/Electrolytes/Nutrition:Routine in and outs  BMP within acceptable range on 4/26 8. Post stroke dysphagia. Dysphagia #1 honey thick liquids, advance diet as tolerated.    Consulted IR for PEG placement   Continue IVF, taking in solids fairly well. Struggles with liquids (honey) but upgraded to thin liquids  9. Orthostatic hypotension. Monitor with increased mobility Vitals:   06/30/19 1954 07/01/19 0556  BP: (!) 162/77 (!) 167/88  Pulse: 83 84  Resp: 18 19  Temp: 97.9 F (36.6 C) 97.8 F (36.6 C)  SpO2: 99% 97%   Elevated add BB  10. Hyperlipidemia. Lipitor 11. Tobacco abuse. NicoDerm patch.  Counseled when possible 12. Leukocytosis. -Per CT abd  RLL atelectasis vs PNA, given pt afebrile , WBCs elevated but stable     5/8 no new cough, afebrile  -encourage OOB,  aspriation precautions. Pt/Adrian [name] aware  -continue to monitor LOS: 19 days A FACE TO FACE EVALUATION WAS PERFORMED  Adrian [name] 07/01/2019, 10:07 AM

## 2019-07-01 NOTE — Care Management (Signed)
Patient ID: Adrian [name], male   DOB: 1965/09/02, 54 y.o.   MRN: [id number] Barbara Cower from [facility] to assess patient for TIS wheelchair and cushion for discharge. Order placed with [facility] for a hospital bed, alternating air cushion overlay for bed, hoyer lift and pad for discharge. Patient's family has purchased a BSC and TTB is not recommended at present as patient will not be showering at discharge.

## 2019-07-01 NOTE — Progress Notes (Signed)
Modified Barium Swallow Progress Note  Patient Details  Name: Adrian [name] MRN: [id number] Date of Birth: 1965-03-18  Today's Date: 07/01/2019  Modified Barium Swallow completed.  Full report located under Chart Review in the Imaging Section.  Brief recommendations include the following:  Clinical Impression  Pt demonstrated adequate swallow function during the consumption of thin via TSP, thin via straw, regular textures and mixed consistencies. Although the swallowing assessment was limited in the quanty of trials (consumed each item x1) due to pt's behavior further impacted by pain. Pt demonstrated functional oral control, including appropriate mastication, lingual coordination and oral clearance with all assessed textures. Pt initiated swallow at the vallecula with solids and at the pyriform sinuses with liquids, achieving laryngeal vestibule closure, and pharyngeal contraction, all within normal range. Trace aspiration noted during mixed consistency trial (spillng over arytenoids during the swallow due to pooling of liquids at pyriform sinuses), however appeared to be cleared at the completion of the swallow. SLP recommends upgraded diet of thin liquids via cup/straw, regular textures, medication whole (one at a time) with thin liquids and full supervision. Follow up ST treatment prior to discharge from dysphagia services, is warranted to further assess realistic postioning during PO consumption (due to pain/discomfort) and tolerance of upgraded diet given the limited trials in today's instrumental swallow assessment.       Swallow Evaluation Recommendations       SLP Diet Recommendations: Thin liquid;Regular solids   Liquid Administration via: Cup;Straw   Medication Administration: ((one at a time))   Supervision: Staff to assist with self feeding;Full supervision/cueing for compensatory strategies   Compensations: Minimize environmental distractions;Slow rate;Small sips/bites   Postural Changes: Seated upright at 90 degrees(Must be seated near 90 degrees during PO consumption)   Oral Care Recommendations: Oral care BID   Other Recommendations: Have oral suction available    Hatim Homann 07/01/2019,11:13 AM

## 2019-07-01 NOTE — Progress Notes (Signed)
Physical Therapy Wound Treatment Patient Details  Name: Adrian [name] MRN: [id number] Date of Birth: Mar 24, 1965  Today's Date: 07/01/2019 PT Individual Time: 1321-1417 PT Individual Time Calculation (min): 56 min   Subjective  Subjective: Pt family present and would like to start education on wound care and dressing Patient and Family Stated Goals: pain reduction Prior Treatments: dressing change with Santyl  Pain Score:  Pt premedicated, however expresses 9/10 pain with treatment. Reduces to 7/10 after treatment.  Wound Assessment  Pressure Injury 06/12/19 Coccyx Medial;Upper Unstageable - Full thickness tissue loss in which the base of the injury is covered by slough (yellow, tan, gray, green or brown) and/or eschar (tan, brown or black) in the wound bed. wound is 2cm long by 0.5  (Active)  Wound Image   06/30/19 1500  Dressing Type ABD;Gauze (Comment);Barrier Film (skin prep);Moist to moist 07/01/19 1600  Dressing Changed;New drainage 07/01/19 1600  Dressing Change Frequency Daily 07/01/19 1600  State of Healing Eschar 07/01/19 1600  Site / Wound Assessment Black;Brown;Painful;Yellow;Red 07/01/19 1600  % Wound base Red or Granulating 0% 07/01/19 1600  % Wound base Black/Eschar 100% 07/01/19 1600  Peri-wound Assessment Erythema (blanchable);Maceration 07/01/19 1600  Wound Length (cm) 5 cm 06/30/19 1500  Wound Width (cm) 3.2 cm 06/30/19 1500  Wound Depth (cm) 2.3 cm 06/30/19 1500  Wound Surface Area (cm^2) 16 cm^2 06/30/19 1500  Wound Volume (cm^3) 36.8 cm^3 06/30/19 1500  Tunneling (cm) 0.5 06/23/19 0846  Margins Unattached edges (unapproximated) 07/01/19 1600  Drainage Amount Moderate 07/01/19 1600  Drainage Description Odor;Serosanguineous;Green 07/01/19 1600  Treatment Debridement (Selective);Hydrotherapy (Pulse lavage);Packing (Saline gauze) 07/01/19 1600      Pressure Injury 06/12/19 Buttocks Left Unstageable - Full thickness tissue loss in which the base of the injury is  covered by slough (yellow, tan, gray, green or brown) and/or eschar (tan, brown or black) in the wound bed. yellow covered wound bed (Active)  Dressing Type ABD;Barrier Film (skin prep);Moist to moist 07/01/19 1600  Dressing Changed;New drainage 07/01/19 1600  Dressing Change Frequency Daily 07/01/19 1600  State of Healing Other (Comment) 07/01/19 1600  Site / Wound Assessment Yellow 07/01/19 1600  % Wound base Yellow/Fibrinous Exudate 100% 07/01/19 1600  Peri-wound Assessment Erythema (non-blanchable);Pink 07/01/19 1600  Wound Length (cm) 1.2 cm 06/30/19 1500  Wound Width (cm) 1.8 cm 06/30/19 1500  Wound Depth (cm) 0 cm 06/27/19 0100  Wound Surface Area (cm^2) 2.16 cm^2 06/30/19 1500  Wound Volume (cm^3) 0 cm^3 06/27/19 0100  Margins Unattached edges (unapproximated) 07/01/19 1600  Drainage Amount Minimal 07/01/19 1600  Drainage Description Serous 07/01/19 1600  Treatment Cleansed 07/01/19 1600   Hydrotherapy Pulsed lavage therapy - wound location: Sacral and L buttock wounds Pulsed Lavage with Suction (psi): 12 psi Pulsed Lavage with Suction - Normal Saline Used: 1000 mL Pulsed Lavage Tip: Tip with splash shield Selective Debridement Selective Debridement - Location: sacral  Selective Debridement - Tools Used: Forceps;Scalpel;Scissors Selective Debridement - Tissue Removed: gray stringy and black eschar from base of wound   Wound Assessment and Plan  Wound Therapy - Assess/Plan/Recommendations Wound Therapy - Clinical Statement: Pt family is present and is educated on wound treatment and healing process. Pt's wound has strong odor and green drainage, WOC nurse to consult tomorrow about addition of Dakin's solution to treament. Santyl not present in room and did not arrive from pharmacy by end of session. RN to change dressing and apply santyl when it arrives. Pt will continue to benefit from pulse lavage and debridement to  decrease bioburden and improve healing.  Wound Therapy -  Functional Problem List: decreased mobility, and pt preference for lying on his back with minimal wound offloading Factors Delaying/Impairing Wound Healing: Immobility;Multiple medical problems Hydrotherapy Plan: Debridement;Patient/family education;Pulsatile lavage with suction Wound Therapy - Frequency: 6X / week Wound Therapy - Current Recommendations: WOC nurse Wound Therapy - Follow Up Recommendations: [place] Wound Plan: see above  Wound Therapy Goals- Improve the function of patient's integumentary system by progressing the wound(s) through the phases of wound healing (inflammation - proliferation - remodeling) by: Decrease Necrotic Tissue to: 80 Decrease Necrotic Tissue - Progress: Progressing toward goal Increase Granulation Tissue to: 20 Increase Granulation Tissue - Progress: Progressing toward goal Decrease Length/Width/Depth by (cm): 5 Decrease Length/Width/Depth - Progress: Progressing toward goal Goals/treatment plan/discharge plan were made with and agreed upon by patient/family: Yes Time For Goal Achievement: 7 days Wound Therapy - Potential for Goals: Good  Goals will be updated until maximal potential achieved or discharge criteria met.  Discharge criteria: when goals achieved, discharge from hospital, MD decision/surgical intervention, no progress towards goals, refusal/missing three consecutive treatments without notification or medical reason.  GP    Dani Gobble. Migdalia Dk PT, DPT Acute Rehabilitation Services Pager [phone] Office [phone]  [place] 07/01/2019, 4:54 PM

## 2019-07-01 NOTE — Progress Notes (Addendum)
Occupational Therapy Session Note  Patient Details  Name: Adrian [name] MRN: [id number] Date of Birth: [birth year]  Today's Date: 07/01/2019 OT Individual Time: 2376-2831 OT Individual Time Calculation (min): 73 min    Short Term Goals: Week 3:  OT Short Term Goal 1 (Week 3): Pt will self feed with mod assist using the RUE with AE for 25% of meal. OT Short Term Goal 2 (Week 3): Pt will complete UB bathing with mod assist in supported sitting position. OT Short Term Goal 3 (Week 3): Pt will roll in the bed to the left side with max assist to help with supine to sit EOB or for donning LB clothing.  Skilled Therapeutic Interventions/Progress Updates:    Pt in bed to start session with PT finishing dressing his sacral wound post hydrotherapy.  Therapist brought in mug with lid as well as foam grips to try for self feeding now that pt's diet has been upgraded to regular and thin liquids.  HOB was elevated as high as possible with pt positioned on his left side as he cannot be positioned on his back.  He was presented with a mug with lid to work on drinking.  He was able to hold the mug with the RUE and bring up to his mouth with with min guard assist and use of a straw to take small sips.  He was also able to scoop up apple sauce with a built up handle on the spoon and use of the RUE and mod facilitation for supination of his hand and for better control.  Educated family on use of the foam grips over utensils with meals as well as using the mug with lid to assist pt with functional use.  Pt agreed to work on standing at the side of the bed with use of the [place].  He was able to transfer to sitting with total assist and then complete two intervals of standing with total +2 (pt 30%) using the [place].  Hand over hand assist was provided by therapist in order to hang onto the handle of the [facility]  Increased knee adduction with increased knee flexion was noted in standing.  Max facilitation was needed for upright  posture as well as hip extension.  Once complete, pt requested to return to supine for pain relief.  Total assist was needed for transition to the right side.  He agreed to working on the LUE for remainder of session.  NMES was applied to the left elbow while pt was active with initiation of elbow extension in sidelying and with therapist facilitating at max assist. He tolerated 25 mins of active stimulation without any adverse reactions.  See below for settings of NMES.  Pt was left with family at end of session working on use of the urinal and waiting on his first regular meal tray.    Sa[place]o Stim One 330 pulse width 35 Hz pulse rate On 8 sec/ off 8 sec Ramp up/ down 2 sec Symmetrical Biphasic wave form  Max intensity at 500 Ohm load   Therapy Documentation Precautions:  Precautions Precautions: Fall Precaution Comments: sacral wound Restrictions Weight Bearing Restrictions: No  Pain: Pain Assessment Pain Scale: Faces Faces Pain Scale: Hurts little more Pain Type: Chronic pain Pain Location: Buttocks Pain Orientation: Medial Pain Descriptors / Indicators: Discomfort Pain Onset: With Activity Pain Intervention(s): Repositioned;Emotional support ADL: See Care Tool Section for some details of mobility and selfcare  Therapy/Group: Individual Therapy  Ahtziri Jeffries OTR/L 07/01/2019, 4:39  PM

## 2019-07-01 NOTE — Plan of Care (Signed)
  Problem: Consults Goal: RH STROKE PATIENT EDUCATION Description: See Patient Education module for education specifics  07/01/2019 1903 by Kalman Shan, LPN Outcome: Progressing 07/01/2019 1902 by Doran Durand A, LPN Outcome: Progressing   Problem: RH BOWEL ELIMINATION Goal: RH STG MANAGE BOWEL WITH ASSISTANCE Description: STG Manage Bowel with mod Assistance. 07/01/2019 1903 by Kalman Shan, LPN Outcome: Progressing 07/01/2019 1902 by Kalman Shan, LPN Outcome: Progressing Goal: RH STG MANAGE BOWEL W/MEDICATION W/ASSISTANCE Description: STG Manage Bowel with Medication with mod Assistance. Outcome: Progressing   Problem: RH BLADDER ELIMINATION Goal: RH STG MANAGE BLADDER WITH ASSISTANCE Description: STG Manage Bladder With mod Assistance 07/01/2019 1903 by Kalman Shan, LPN Outcome: Progressing 07/01/2019 1902 by Doran Durand A, LPN Outcome: Progressing   Problem: RH SKIN INTEGRITY Goal: RH STG SKIN FREE OF INFECTION/BREAKDOWN Outcome: Progressing Goal: RH STG MAINTAIN SKIN INTEGRITY WITH ASSISTANCE Description: STG Maintain Skin Integrity With mod Assistance. Outcome: Progressing Goal: RH STG ABLE TO PERFORM INCISION/WOUND CARE W/ASSISTANCE Description: STG Able To Perform Incision/Wound Care With max Assistance. Outcome: Progressing   Problem: RH SAFETY Goal: RH STG ADHERE TO SAFETY PRECAUTIONS W/ASSISTANCE/DEVICE Description: STG Adhere to Safety Precautions With min Assistance/Device. Outcome: Progressing   Problem: RH COGNITION-NURSING Goal: RH STG USES MEMORY AIDS/STRATEGIES W/ASSIST TO PROBLEM SOLVE Description: STG Uses Memory Aids/Strategies With min Assistance to Problem Solve. Outcome: Progressing Goal: RH STG ANTICIPATES NEEDS/CALLS FOR ASSIST W/ASSIST/CUES Description: STG Anticipates Needs/Calls for Assist With min Assistance/Cues. Outcome: Progressing   Problem: RH PAIN MANAGEMENT Goal: RH STG PAIN MANAGED AT OR BELOW PT'S  PAIN GOAL Description: Less than 3 out of 10 Outcome: Progressing   Problem: RH KNOWLEDGE DEFICIT Goal: RH STG INCREASE KNOWLEDGE OF HYPERTENSION Description: Patient/family will be able to identify medication to treat HTN along with 2 other nonpharmalogical interventions Outcome: Progressing Goal: RH STG INCREASE KNOWLEDGE OF DYSPHAGIA/FLUID INTAKE Description: Patient will be able to demonstrate understanding of thickened fluid necessity Outcome: Progressing Goal: RH STG INCREASE KNOWLEGDE OF HYPERLIPIDEMIA Outcome: Progressing Goal: RH STG INCREASE KNOWLEDGE OF STROKE PROPHYLAXIS Description: Patient/Family will be able to verbalize to 3 medications for stroke prevention post discharge  Outcome: Progressing

## 2019-07-01 NOTE — Plan of Care (Signed)
  Problem: Consults Goal: RH STROKE PATIENT EDUCATION Description: See Patient Education module for education specifics  Outcome: Progressing   Problem: RH BOWEL ELIMINATION Goal: RH STG MANAGE BOWEL WITH ASSISTANCE Description: STG Manage Bowel with mod Assistance. Outcome: Progressing   Problem: RH BLADDER ELIMINATION Goal: RH STG MANAGE BLADDER WITH ASSISTANCE Description: STG Manage Bladder With mod Assistance Outcome: Progressing

## 2019-07-01 NOTE — Progress Notes (Signed)
Physical Therapy Session Note  Patient Details  Name: Adrian [name] MRN: [id number] Date of Birth: 08-28-1965  Today's Date: 07/01/2019 PT Individual Time: 0300-9233 PT Individual Time Calculation (min): 65 min   Short Term Goals: Week 3:  PT Short Term Goal 1 (Week 3): Pt will perform supine<>sit with max assist of 1 PT Short Term Goal 2 (Week 3): Pt wil perform bed<>chair transfer with max assist of 1 PT Short Term Goal 3 (Week 3): Pt will perform sit<>stand with max assist of 1 using LRAD PT Short Term Goal 4 (Week 3): Pt will ambulate at least 94ft using LRAD with +2 max assist  Skilled Therapeutic Interventions/Progress Updates:   Pt received L sidelying in bed with his wife and son present. Pt's wife reports that the pt is giving up reports pt stating his family should have left him on the ground outside their house when he had the stroke because he doesn't want to live - pt does state some of this during therapy session - SW notified to schedule follow-up neuropsych consult. Pt agreeable to limited therapy session in room stating he is not able to tolerate transport to/from gym in w/c. [place], [place], in/out to report updated diet. Fleet Contras, ATP, present for custom w/c evaluation - discussed pt's impaired trunk control, B LE flexor and adductor tone, sacral wound, L heel wound, limited B LE ROM - discussed need for roho cushion and tilt feature for pressure relief as well as increased cushion and lateral support on back of w/c. Pt performed L sidelying R LE hip/knee flexion/extension x12 reps, hamstring stretch 2x30seconds, heel cord stretch 2x30seconds (limited motion), and adductor stretch x30 seconds. L sidelying>supine>R sidelying with total assist due to sacral wound with pt having guarded movement requiring increased assist. R sidelying L LE repeated the above exercises as were performed on R LE. Pt agreeable to attempt standing. L sidelying>sitting EOB with total assist for B LE  management and trunk upright. Sit<>stand x2 to/from EOB with +2 max assist for lifting and manual facilitation and cuing for increased B LE hip/knee extension - tolerated standing ~28minutes each trial focusing on increased trunk/hip/knee extension. Sitting>L sidelying with +2 total assist for trunk descent and B LE management. Pt left L sidelying with needs in reach and family present.   Therapy Documentation Precautions:  Precautions Precautions: Fall, Other (comment) Precaution Comments: check BP on left arm due to occlusion on R side. Restrictions Weight Bearing Restrictions: No  Pain: Reports significant sacral wound pain - family reports medication due at 12:30pm - pt agreeable to limited therapy session in room - therapist provided repositioning for pressure relief and emotional support for pain management.   Therapy/Group: Individual Therapy  Ginny Forth, PT, DPT 07/01/2019, 8:01 AM

## 2019-07-02 ENCOUNTER — Inpatient Hospital Stay (HOSPITAL_COMMUNITY): Payer: Self-pay

## 2019-07-02 ENCOUNTER — Inpatient Hospital Stay (HOSPITAL_COMMUNITY): Payer: Self-pay | Admitting: Speech Pathology

## 2019-07-02 ENCOUNTER — Inpatient Hospital Stay (HOSPITAL_COMMUNITY): Payer: Self-pay | Admitting: Occupational Therapy

## 2019-07-02 LAB — CBC WITH DIFFERENTIAL/PLATELET
Abs Immature Granulocytes: 0.05 10*3/uL (ref 0.00–0.07)
Basophils Absolute: 0.1 10*3/uL (ref 0.0–0.1)
Basophils Relative: 1 %
Eosinophils Absolute: 0.2 10*3/uL (ref 0.0–0.5)
Eosinophils Relative: 1 %
HCT: 40 % (ref 39.0–52.0)
Hemoglobin: 13.3 g/dL (ref 13.0–17.0)
Immature Granulocytes: 0 %
Lymphocytes Relative: 21 %
Lymphs Abs: 3.1 10*3/uL (ref 0.7–4.0)
MCH: 30.5 pg (ref 26.0–34.0)
MCHC: 33.3 g/dL (ref 30.0–36.0)
MCV: 91.7 fL (ref 80.0–100.0)
Monocytes Absolute: 0.9 10*3/uL (ref 0.1–1.0)
Monocytes Relative: 6 %
Neutro Abs: 10.9 10*3/uL — ABNORMAL HIGH (ref 1.7–7.7)
Neutrophils Relative %: 71 %
Platelets: 390 10*3/uL (ref 150–400)
RBC: 4.36 MIL/uL (ref 4.22–5.81)
RDW: 14.2 % (ref 11.5–15.5)
WBC: 15.2 10*3/uL — ABNORMAL HIGH (ref 4.0–10.5)
nRBC: 0 % (ref 0.0–0.2)

## 2019-07-02 LAB — BASIC METABOLIC PANEL
Anion gap: 13 (ref 5–15)
BUN: 5 mg/dL — ABNORMAL LOW (ref 6–20)
CO2: 25 mmol/L (ref 22–32)
Calcium: 8.9 mg/dL (ref 8.9–10.3)
Chloride: 102 mmol/L (ref 98–111)
Creatinine, Ser: 0.68 mg/dL (ref 0.61–1.24)
GFR calc Af Amer: >60 mL/min (ref >60)
GFR calc non Af Amer: >60 mL/min (ref >60)
Glucose, Bld: 108 mg/dL — ABNORMAL HIGH (ref 70–99)
Potassium: 3.3 mmol/L — ABNORMAL LOW (ref 3.5–5.1)
Sodium: 140 mmol/L (ref 135–145)

## 2019-07-02 MED ORDER — OXYCODONE HCL ER 15 MG PO T12A
15.0000 mg | EXTENDED_RELEASE_TABLET | Freq: Two times a day (BID) | ORAL | Status: DC
  Administered 2019-07-02 – 2019-07-09 (×14): 15 mg via ORAL
  Filled 2019-07-02 (×14): qty 1

## 2019-07-02 MED ORDER — DAKINS (1/4 STRENGTH) 0.125 % EX SOLN
Freq: Two times a day (BID) | CUTANEOUS | Status: AC
  Filled 2019-07-02: qty 473

## 2019-07-02 NOTE — Progress Notes (Signed)
Physical Therapy Wound Treatment Patient Details  Name: Adrian [name] MRN: [id number] Date of Birth: 1965/12/12  Today's Date: 07/02/2019 PT Individual Time: 9892-1194 PT Individual Time Calculation (min): 38 min   Subjective  Subjective: Surgical PA present during session and happy with progression, agrees to 3 day prescription for Dakins solution given green malodorus drainage Patient and Family Stated Goals: pain reduction Prior Treatments: dressing change with Santyl  Pain Score:  Premedicated prior to treatment, 10/10 pain with pulse lavage and debridement  Wound Assessment  Pressure Injury 06/12/19 Coccyx Medial;Upper Unstageable - Full thickness tissue loss in which the base of the injury is covered by slough (yellow, tan, gray, green or brown) and/or eschar (tan, brown or black) in the wound bed. wound is 2cm long by 0.5  (Active)  Wound Image   06/30/19 1500  Dressing Type ABD;Gauze (Comment);Barrier Film (skin prep);Moist to moist 07/02/19 1000  Dressing Changed;New drainage 07/02/19 1000  Dressing Change Frequency Daily 07/02/19 1000  State of Healing Eschar 07/02/19 1000  Site / Wound Assessment Black;Brown;Painful;Yellow;Red 07/02/19 1000  % Wound base Red or Granulating 60% 07/02/19 1000  % Wound base Black/Eschar 40% 07/02/19 1000  Peri-wound Assessment Maceration;Erythema (non-blanchable);Edema 07/02/19 1000  Wound Length (cm) 5 cm 06/30/19 1500  Wound Width (cm) 3.2 cm 06/30/19 1500  Wound Depth (cm) 2.3 cm 06/30/19 1500  Wound Surface Area (cm^2) 16 cm^2 06/30/19 1500  Wound Volume (cm^3) 36.8 cm^3 06/30/19 1500  Tunneling (cm) 0.5 06/23/19 0846  Margins Unattached edges (unapproximated) 07/02/19 1000  Drainage Amount Moderate 07/02/19 1000  Drainage Description Odor;Serosanguineous;Green 07/02/19 1000  Treatment Debridement (Selective);Hydrotherapy (Pulse lavage);Packing (Saline gauze) 07/02/19 1000   Santyl applied to necrotic tissue  Pressure Injury 06/12/19  Buttocks Left Unstageable - Full thickness tissue loss in which the base of the injury is covered by slough (yellow, tan, gray, green or brown) and/or eschar (tan, brown or black) in the wound bed. yellow covered wound bed (Active)  Dressing Type ABD;Barrier Film (skin prep);Moist to moist 07/02/19 1000  Dressing Changed;New drainage 07/02/19 1000  Dressing Change Frequency Daily 07/02/19 1000  State of Healing Other (Comment) 07/02/19 1000  Site / Wound Assessment Yellow 07/02/19 1000  % Wound base Yellow/Fibrinous Exudate 100% 07/02/19 1000  Peri-wound Assessment Erythema (non-blanchable);Pink 07/02/19 1000  Wound Length (cm) 1.2 cm 06/30/19 1500  Wound Width (cm) 1.8 cm 06/30/19 1500  Wound Depth (cm) 0 cm 06/27/19 0100  Wound Surface Area (cm^2) 2.16 cm^2 06/30/19 1500  Wound Volume (cm^3) 0 cm^3 06/27/19 0100  Margins Unattached edges (unapproximated) 07/02/19 1000  Drainage Amount Minimal 07/02/19 1000  Drainage Description Serous 07/02/19 1000  Treatment Cleansed 07/01/19 1600   Hydrotherapy Pulsed lavage therapy - wound location: Sacral and L buttock wounds Pulsed Lavage with Suction (psi): 12 psi Pulsed Lavage with Suction - Normal Saline Used: 1000 mL Pulsed Lavage Tip: Tip with splash shield Selective Debridement Selective Debridement - Location: sacral  Selective Debridement - Tools Used: Forceps;Scissors Selective Debridement - Tissue Removed: yellow stringy friable slough    Wound Assessment and Plan  Wound Therapy - Assess/Plan/Recommendations Wound Therapy - Clinical Statement: Surgical PA present during session, happy with wound progression, agrees to 3 day Dakin's treatment and continuation of hydrotherapy through Monday. Pt will benefit from continued pulse lavage and debridement to reduce bioburden and improve wound healing.  Wound Therapy - Functional Problem List: decreased mobility, and pt preference for lying on his back with minimal wound offloading Factors  Delaying/Impairing Wound Healing: Immobility;Multiple medical  problems Hydrotherapy Plan: Debridement;Patient/family education;Pulsatile lavage with suction Wound Therapy - Frequency: 6X / week Wound Therapy - Current Recommendations: WOC nurse Wound Therapy - Follow Up Recommendations: [place] Wound Plan: see above  Wound Therapy Goals- Improve the function of patient's integumentary system by progressing the wound(s) through the phases of wound healing (inflammation - proliferation - remodeling) by: Decrease Necrotic Tissue to: 30 Decrease Necrotic Tissue - Progress: Updated due to goal met Increase Granulation Tissue to: 70 Increase Granulation Tissue - Progress: Updated due to goal met Decrease Length/Width/Depth by (cm): 5 Decrease Length/Width/Depth - Progress: Progressing toward goal Goals/treatment plan/discharge plan were made with and agreed upon by patient/family: Yes Time For Goal Achievement: 7 days Wound Therapy - Potential for Goals: Good  Goals will be updated until maximal potential achieved or discharge criteria met.  Discharge criteria: when goals achieved, discharge from hospital, MD decision/surgical intervention, no progress towards goals, refusal/missing three consecutive treatments without notification or medical reason.  GP    Dani Gobble. Migdalia Dk PT, DPT Acute Rehabilitation Services Pager [phone] Office [phone]  [place] 07/02/2019, 10:59 AM

## 2019-07-02 NOTE — Progress Notes (Signed)
Xeomin Injection for spasticity using needle EMG guidance  Dilution: 50 Units/ml Indication: Severe spasticity which interferes with ADL,mobility and/or  hygiene and is unresponsive to medication management and other conservative care Verbal consent from wife and pt was obtained after describing risks and benefits of the procedure with the patient. This includes bleeding, bruising, infection, excessive weakness, or medication side effects. Xeomin samples  Lot # [id number], [id number] Exp date 2022-02,2022-04,2022-09,2021-09  Needle: 25g 1" needle Number of units per muscle LEFT Biceps 50 Adductor longus 100 Gastroc 50 Soleus  50 Hamstrings 150 All injections were done after obtaining appropriate EMG activity and after negative drawback for blood. The patient tolerated the procedure well. Post procedure instructions were given. A followup appointment was made.

## 2019-07-02 NOTE — Progress Notes (Signed)
Speech Language Pathology Daily Session Note  Patient Details  Name: Adrian [name] MRN: [id number] Date of Birth: 08/02/1965  Today's Date: 07/02/2019 SLP Individual Time: 1500-1530 SLP Individual Time Calculation (min): 30 min  Short Term Goals: Week 3: SLP Short Term Goal 1 (Week 3): Pt will participate in MBSS to assess potential for liquid and solid advancement. SLP Short Term Goal 2 (Week 3): Pt will consume trials of upgraded solids with functional mastication and full oral clearance with Mod A cues for use of swallow strategies. SLP Short Term Goal 3 (Week 3): Pt will verbalize at the word level to achieve 50% intelligibility with Mod A multimodal cueing for intelligibility strategies. SLP Short Term Goal 4 (Week 3): Pt will sustain attention to functional tasks for 5-7 minutes with Mod A multimodal cues. SLP Short Term Goal 5 (Week 3): Pt will demonstrate ability to problem solve basic and functional situations with Mod A multimodal cues.  Skilled Therapeutic Interventions:  Pt was seen for skilled ST targeting dysphagia goals.  Pt's primary PT and family both expressed concerns over pt's ability to tolerate a 90 degree seated position when eating regular textured foods and thin liquids during meals due to pain from sacral wound.  Upon arrival, pt was laying in bed at 15 degrees and was agreeable to elevating head of bed to 30 degrees for PO intake.  At 30 degrees in sidelying position, pt demonstrated no overt s/s of aspiration with straw sips of thin liquids.  Pt had complaints of xerostomia and had difficulty chewing dry, solid textures which lead to pt taking large consecutive sips of liquids immediately following solids.  This change in rate of intake in combination with consumption of mixed consistencies lead to what appeared to be a discoordination in swallowing response as evidenced by loud audible swallows and watery eyes which were then followed by a weak cough.   I suspect that pt  would be more appropriate for a dys 3/mechanical soft diet; however, with his history of refusing to eat I do not want to strictly limit his choices now that he finally has an appetite and interest in eating.  As a result, I instead provided education to pt's wife and son regarding making appropriate meal selections for pt when ordering from the kitchen or bringing food from home. I emphasized the importance of making sure that the overall textures of his meals should be that of soft solids and that pt should only be eating small volumes at a slow rate to maximize safety given that he is unable to tolerate a more upright posture at this time.  SLP discussed pt's risk factors in the setting of dysphagia, positioning limitations, overall debility and concomitant decrease in mobilization.  All questions were answered to pt's and family's satisfaction at this time.  Continue per current plan of care.    Pain Pain Assessment Pain Scale: 0-10 Pain Score: 0-No pain  Therapy/Group: Individual Therapy  Adrian [name], Melanee Spry 07/02/2019, 3:51 PM

## 2019-07-02 NOTE — Progress Notes (Signed)
Pt was offered to have a bath due to it being scheduled on night shift. Pt refused and wife was present.

## 2019-07-02 NOTE — Progress Notes (Signed)
Team Conference Report to Patient/Family  Team Conference discussion was reviewed with the patient and spouse and son, including goals, any changes in plan of care and target discharge date.  Patient and caregiver express understanding and are in agreement.  The patient has a target discharge date of 07/09/19. Reviewed PEG tube on back order is now in however MD note will hold off and allow pt to attempt hydration without PEG. Patient is refusing PEG placement and acknowledged understanding of need to increase fluids; MBS scheduled for Thursday with hope of upgrading diet/liquid consistency. Reviewed adjustment of medications for tone in left side and pain issues/wound care for pressure wounds. Reviewed recommended DME including hospital bed, alternating pressure relief mattress and family noted they have rearranged furniture to accommodate the hospital bed and hoyer lift in the home. Confirmed entrance ramp is almost complete and they have purchased a BSC. [employer] rep to come in Thursday to complete a seating assessment for a TIS wheelchair and specialty cushion. Confirmed [facility] transport for discharge.  Adrian [name] 07/02/2019, 9:59 AM

## 2019-07-02 NOTE — Plan of Care (Signed)
  Problem: RH BOWEL ELIMINATION Goal: RH STG MANAGE BOWEL WITH ASSISTANCE Description: STG Manage Bowel with mod Assistance. Outcome: Progressing Goal: RH STG MANAGE BOWEL W/MEDICATION W/ASSISTANCE Description: STG Manage Bowel with Medication with mod Assistance. Outcome: Progressing   Problem: RH COGNITION-NURSING Goal: RH STG ANTICIPATES NEEDS/CALLS FOR ASSIST W/ASSIST/CUES Description: STG Anticipates Needs/Calls for Assist With min Assistance/Cues. Outcome: Progressing   Problem: RH PAIN MANAGEMENT Goal: RH STG PAIN MANAGED AT OR BELOW PT'S PAIN GOAL Description: Less than 3 out of 10 Outcome: Progressing

## 2019-07-02 NOTE — Progress Notes (Signed)
Occupational Therapy Session Note  Patient Details  Name: Adrian [name] MRN: [id number] Date of Birth: [birth year]  Today's Date: 07/02/2019 OT Individual Time: 0800-0902 OT Individual Time Calculation (min): 62 min    Short Term Goals: Week 3:  OT Short Term Goal 1 (Week 3): Pt will self feed with mod assist using the RUE with AE for 25% of meal. OT Short Term Goal 2 (Week 3): Pt will complete UB bathing with mod assist in supported sitting position. OT Short Term Goal 3 (Week 3): Pt will roll in the bed to the left side with max assist to help with supine to sit EOB or for donning LB clothing.  Skilled Therapeutic Interventions/Progress Updates:    Pt in bed to start session with spouse present.  Pt resistant to participate in session secondary to buttocks pain, requiring max coaxing to participate.  He agreed to work on standing from the bedside but refused to transfer to the wheelchair for transition to the therapy gym.  Total assist was needed for donning his shorts in supine rolling side to side.  Pt with increased pain when rolling onto his buttocks.  He needed total assist for transition to sitting EOB with increased pain and an increase in bilateral LE tone in the hamstrings and adductors, with left being more severe than the right.  He was able to complete sit to stand X 3 during session with total assist +2 (pt 30%).  Increased tone in the hamstrings limit ability to fully extend knees initially but after being up for 10-15 seconds, he is finally able to achieve full right knee extension with min facilitation at the knee.  Increased trunk and cervical flexion noted throughout standing trial.  Attempted standing in the [place], but pt unable to complete upright standing with this trial of it.  Transitioned to having him work on standing with RUE around therapist.  He was unable to place the left secondary to too much tone.  Standing endurance for approximately 1-1.5 mins before transitioning  back to sitting.  Pt only able to tolerate sitting for a brief period with weight shifted forward over his ischial tuberosities.  He needed max assist to drink from a cup while sitting up, using the LUE.  Had him transition back to sidelying for relief where therapist assisted with oral hygiene at total assist.  He was then positioned in prone for approximately 4 mins for pressure relief before finishing in sidelying.  Pt left with spouse in room and MD coming in to administer botox injections.    Therapy Documentation Precautions:  Precautions Precautions: Fall Precaution Comments: sacral wound Restrictions Weight Bearing Restrictions: No   Pain: Pain Assessment Pain Scale: Faces Faces Pain Scale: Hurts little more Pain Type: Acute pain Pain Location: Buttocks Pain Descriptors / Indicators: Discomfort;Jabbing Pain Onset: With Activity Pain Intervention(s): Repositioned;Emotional support ADL: See Care Tool Section for some details of mobility and selfcare  Therapy/Group: Individual Therapy  Michio Thier OTR/L 07/02/2019, 12:12 PM

## 2019-07-02 NOTE — Progress Notes (Signed)
Physical Therapy Session Note  Patient Details  Name: Adrian [name] MRN: [id number] Date of Birth: 06/20/1965  Today's Date: 07/02/2019 PT Individual Time: 1304-1405 PT Individual Time Calculation (min): 61 min   Short Term Goals: Week 3:  PT Short Term Goal 1 (Week 3): Pt will perform supine<>sit with max assist of 1 PT Short Term Goal 2 (Week 3): Pt wil perform bed<>chair transfer with max assist of 1 PT Short Term Goal 3 (Week 3): Pt will perform sit<>stand with max assist of 1 using LRAD PT Short Term Goal 4 (Week 3): Pt will ambulate at least 9ft using LRAD with +2 max assist  Skilled Therapeutic Interventions/Progress Updates:    Pt received R sidelyling in bed with his wife and son present. Pt agreeable to therapy sessions but requests for assistance eating. Per SLP pt has to be upright as close to 90degrees as possible. Pt agreeable to sit EOB to eat and then return to R sidelying once the sacral pain becomes intolerable. Performed many repetitions of R sidelying>sitting with heavy max assist of 1 and pt able to bring B LEs off EOB and use R UE to assist with trunk upright then sitting>R sidelying with max/total assist of 1 for trunk descent (cuing for use of R UE to assist with trunk) and B LE management back up into the bed. Sitting EOB requires min assist for static sitting balance while eating - pt's meal was steak sir fry and pt's son had brought deer burgers - both of these meats required significant time to chew prior to swallowing with pt requesting sips of drinks between each bite to help swallow Joni Reining, SLP notified that per this therapist it sounded like pt was having difficulty swallowing thin liquids as well as requiring significant time to chew meat and was not able to fully swallow prior to needing sidelying break due to sacral pain; SLP planning to discuss other food options with pt for improved swallowing. Pt only able to take ~3-4 small bites during sitting EOB<>R sidelying  technique with pt having increased sacral pain. Discussed attempting to eat in standing frame. Dependent assist to stand in standing frame using harness and tolerated standing 2x ~1-76minute each trial with pt able to eat ~3bites with pt becoming choked on food requiring time in sitting to clear throat. Sit>R sidelying with total assist. Pt left R sidelying in bed with family present and needs in reach.    Therapy Documentation Precautions:  Precautions Precautions: Fall Precaution Comments: sacral wound Restrictions Weight Bearing Restrictions: No  Pain:  Continues to have significant sacral pain - provided repositioning and rest breaks for pain management.  Therapy/Group: Individual Therapy  Ginny Forth, PT, DPT 07/02/2019, 12:18 PM

## 2019-07-02 NOTE — Progress Notes (Signed)
[facility] PHYSICAL MEDICINE & REHABILITATION PROGRESS NOTE   Subjective/Complaints:  Seen by PT OT remains spastic in the left hip adductors, knee flexors, ankle plantar flexors and left elbow flexors Spasticity remains despite tizanidine and aggressive PT OT Still receiving hydrotherapy.  Has buttock pain that limits therapy.  Needs to be able to sit up to drink properly.  His diet was liberalized yesterday but needs to be up but this is painful for him. ROS: Patient denies CP, SOB N/V/D  + buttocks pain   Objective:   No results found. No results for input(s): WBC, HGB, HCT, PLT in the last 72 hours. No results for input(s): NA, K, CL, CO2, GLUCOSE, BUN, CREATININE, CALCIUM in the last 72 hours.  Intake/Output Summary (Last 24 hours) at 07/02/2019 0936 Last data filed at 07/01/2019 1754 Gross per 24 hour  Intake 455 ml  Output 0 ml  Net 455 ml     Physical Exam: Vital Signs Blood pressure 132/74, pulse 79, temperature 98.4 F (36.9 C), temperature source Oral, resp. rate 20, height 5\' 11"  (1.803 m), weight 66.3 kg, SpO2 100 %.   General: No acute distress Mood and affect are appropriate Heart: Regular rate and rhythm no rubs murmurs or extra sounds Lungs: Clear to auscultation, breathing unlabored, no rales or wheezes Abdomen: Positive bowel sounds, soft nontender to palpation, nondistended Extremities: No clubbing, cyanosis, or edema Skin: left heel pressure area     Skin:s  facial dermatitis improved    Neurologic: severe dysarthria. Cranial nerves II through XII intact, motor strength is 0/5 in Left  deltoid, bicep, tricep, grip, hip flexor, knee extensors, ankle dorsiflexor and plantar flexor 3/5 R biceps, triceps, 2- finger flexion and ext, trace R hip knee ext synergy Sensory exam normal sensation to light touch and proprioception in bilateral upper and lower extremities. LUE biceps tone 3/4. 1/4 wrist/finger flexors. Extensor tone 1-2/4 in LE's.  Cerebellar exam  NA d/t weakness   Musculoskeletal:  No joint swelling  Photo 06/21/2019    06/28/2019     Assessment/Plan: 1. 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  Physiatrist is providing close team supervision and 24 hour management of active medical problems listed below.  Physiatrist and rehab team continue to assess barriers to discharge/monitor patient progress toward functional and medical goals  Care Tool:  Bathing        Body parts bathed by helper: Right arm, Left arm, Chest, Abdomen, Front perineal area, Buttocks, Right upper leg, Left upper leg, Right lower leg, Left lower leg, Face     Bathing assist Assist Level: Dependent - Patient 0%     Upper Body Dressing/Undressing Upper body dressing   What is the patient wearing?: Pull over shirt    Upper body assist Assist Level: 2 Helpers(sitting unsupported EOB)    Lower Body Dressing/Undressing Lower body dressing      What is the patient wearing?: Pants     Lower body assist Assist for lower body dressing: Dependent - Patient 0%     Toileting Toileting    Toileting assist Assist for toileting: 2 Helpers     Transfers Chair/bed transfer  Transfers assist  Chair/bed transfer activity did not occur: Safety/medical concerns  Chair/bed transfer assist level: Total Assistance - Patient < 25%(squat pivot)     Locomotion Ambulation   Ambulation assist   Ambulation activity did not occur: Safety/medical  concerns  Assist level: Total Assistance - Patient < 25% Assistive device: Lite Gait Max distance: 34ft   Walk 10 feet activity   Assist  Walk 10 feet activity did not occur: Safety/medical concerns  Assist level: Total Assistance - Patient < 25% Assistive device: Lite Gait   Walk 50 feet activity   Assist Walk 50 feet with 2 turns activity did not occur:  Safety/medical concerns         Walk 150 feet activity   Assist Walk 150 feet activity did not occur: Safety/medical concerns         Walk 10 feet on uneven surface  activity   Assist Walk 10 feet on uneven surfaces activity did not occur: Safety/medical concerns         Wheelchair     Assist     Wheelchair activity did not occur: Safety/medical concerns         Wheelchair 50 feet with 2 turns activity    Assist    Wheelchair 50 feet with 2 turns activity did not occur: Safety/medical concerns       Wheelchair 150 feet activity     Assist  Wheelchair 150 feet activity did not occur: Safety/medical concerns       Blood pressure 132/74, pulse 79, temperature 98.4 F (36.9 C), temperature source Oral, resp. rate 20, height 5\' 11"  (1.803 m), weight 66.3 kg, SpO2 100 %.  Medical Problem List and Plan: 1.Dysarthria/quadriparesis/aphasiasecondary to multifocal infarcts mostly posterior circulation but also bilateral anterior circulation cardioembolic pattern as well as innominate artery occlusion, left ICA thrombus versus soft plaque, right V4 and basal artery occlusive thrombus  Continue CIR, PT,  OT, speech therapy Hold off on PEG for now until we could see how he does with liquids, keep IV for fluids  2. Antithrombotics: -DVT/anticoagulation:Lovenox  -antiplatelet therapy: Aspirin 325 mg and Plavix 75 mg daily- will not hold plavix unless PEG is scheduled  3. Pain Management:Tramadol as needed  Decubitus pain, off tramadol, ineffective, started oxyIR 5mg  Q6 prn, given hydrotherapy will add oxycontin 10mg  q 12 dose at 8 am to allow effect prior to hydrotherapy   -encourage re-positioning/OOB as much as possible, supportive seating     dental pain ? improved- left upper molar ,  No sign of abscess, + gingivitis, unable to do orthopantogram because of poor sitting balance 4. Mood:Wellbutrin 150 mg twice daily, may need trial of  Nuedexta, expect PBA given extensive posterior > anterior circulation infarcts -antipsychotic agents: Seroquel 25 mg 3 times daily as needed 5. Neuropsych: This patientis notcapable of making decisions on hisown behalf.  6. Skin/Wound Care:Routine skin checks- sacrococcygeal eschar, , cont foam with collagenase on eschar , appreciate general surgery note- cont collagenase daily , MASD improved  Seborrheic dermatitis - improved on valisone  7. Fluids/Electrolytes/Nutrition:Routine in and outs  BMP within acceptable range on 4/26, repeat today 8. Post stroke dysphagia. Dysphagia #1 honey thick liquids, advance diet as tolerated.    Consulted IR for PEG placement but will hold off for now given the upgrade in diet  Continue IVF, taking in solids fairly well.   9. Orthostatic hypotension. Monitor with increased mobility Vitals:   07/01/19 1952 07/01/19 2300  BP: (!) 142/77 132/74  Pulse: 81 79  Resp: 19 20  Temp: 97.6 F (36.4 C) 98.4 F (36.9 C)  SpO2: 100% 100%   Elevated add BB  10. Hyperlipidemia. Lipitor 11. Tobacco abuse. NicoDerm patch.  Counseled when possible 12. Leukocytosis. -Per CT  abd  RLL atelectasis vs PNA, given pt afebrile , WBCs elevated but stable     5/8 no new cough, afebrile  -encourage OOB, aspriation precautions. Pt/wife aware  -continue to monitor, repeat CBC today #13 chronic spasticity primarily affecting the left elbow flexors, knee flexors, ankle plantar flexor and hip adductor's.  Persist despite aggressive PT OT and tizanidine will inject Xeomin today LOS: 20 days A FACE TO FACE EVALUATION WAS PERFORMED  Erick Colace 07/02/2019, 9:36 AM

## 2019-07-02 NOTE — Progress Notes (Signed)
       Subjective: No new complaints.  Pain with hydrotherapy  ROS: See above, otherwise other systems negative  Objective: Vital signs in last 24 hours: Temp:  [97.6 F (36.4 C)-98.4 F (36.9 C)] 98.4 F (36.9 C) (05/13 2300) Pulse Rate:  [79-91] 79 (05/13 2300) Resp:  [18-20] 20 (05/13 2300) BP: (132-155)/(74-96) 132/74 (05/13 2300) SpO2:  [98 %-100 %] 100 % (05/13 2300) Last BM Date: 06/30/19  Intake/Output from previous day: 05/13 0701 - 05/14 0700 In: 455 [P.O.:455] Out: 0  Intake/Output this shift: No intake/output data recorded.  PE: Skin: wound is significantly cleaner with good granulation tissue present and emerging in most of the wound.  Pseudomonal odor and some green drainage on bandage     Lab Results:  No results for input(s): WBC, HGB, HCT, PLT in the last 72 hours. BMET No results for input(s): NA, K, CL, CO2, GLUCOSE, BUN, CREATININE, CALCIUM in the last 72 hours. PT/INR No results for input(s): LABPROT, INR in the last 72 hours. CMP     Component Value Date/Time   NA 142 06/14/2019 0652   K 4.0 06/14/2019 0652   CL 110 06/14/2019 0652   CO2 23 06/14/2019 0652   GLUCOSE 115 (H) 06/14/2019 0652   BUN 21 (H) 06/14/2019 0652   CREATININE 0.70 06/26/2019 0519   CALCIUM 9.0 06/14/2019 0652   PROT 6.6 06/14/2019 0652   ALBUMIN 2.5 (L) 06/14/2019 0652   AST 26 06/14/2019 0652   ALT 37 06/14/2019 0652   ALKPHOS 72 06/14/2019 0652   BILITOT 0.6 06/14/2019 0652   GFRNONAA >60 06/26/2019 0519   GFRAA >60 06/26/2019 0519   Lipase  No results found for: LIPASE     Studies/Results: No results found.  Anti-infectives: Anti-infectives (From admission, onward)   None       Assessment/Plan Stage IV sacral decubitus ulcer -cont hydrotherapy, but likely will not require much more than a couple more sessions.  They may DC at their discretion for appearance of the wound. -3 days of Dakin's for pseudomonas and then transition back to NS  WD -cont santyl will cleaning up fibrin, but this can be stopped as well once fibrin gone -no further surgical indications -d/w Dan with primary service. -we will sign off.     LOS: 20 days    Letha Cape , [facility] Surgery 07/02/2019, 10:06 AM Please see Amion for pager number during day hours 7:00am-4:30pm or 7:00am -11:30am on weekends

## 2019-07-03 LAB — CREATININE, SERUM
Creatinine, Ser: 0.71 mg/dL (ref 0.61–1.24)
GFR calc Af Amer: >60 mL/min (ref >60)
GFR calc non Af Amer: >60 mL/min (ref >60)

## 2019-07-03 MED ORDER — PROPRANOLOL HCL 10 MG PO TABS
10.0000 mg | ORAL_TABLET | Freq: Every day | ORAL | Status: DC
  Administered 2019-07-03 – 2019-07-04 (×2): 10 mg via ORAL
  Filled 2019-07-03: qty 1

## 2019-07-03 MED ORDER — PROPRANOLOL HCL 10 MG PO TABS: 10.0000 mg | ORAL_TABLET | Freq: Every day | ORAL | Status: DC

## 2019-07-03 MED ORDER — POTASSIUM CHLORIDE 10 MEQ/100ML IV SOLN
10.0000 meq | INTRAVENOUS | Status: AC
  Administered 2019-07-03 – 2019-07-04 (×6): 10 meq via INTRAVENOUS
  Filled 2019-07-03 (×6): qty 100

## 2019-07-03 NOTE — Progress Notes (Signed)
[place] PHYSICAL MEDICINE & REHABILITATION PROGRESS NOTE   Subjective/Complaints: Sleeping this morning. Family visited earlier today. Received hydrotherapy from Wound therapy today Had Xeomin by Dr. Wynn Banker yesterday  ROS: Patient denies CP, SOB N/V/D  + buttocks pain   Objective:   No results found. Recent Labs    07/02/19 1053  WBC 15.2*  HGB 13.3  HCT 40.0  PLT 390   Recent Labs    07/02/19 1053 07/03/19 0655  NA 140  --   K 3.3*  --   CL 102  --   CO2 25  --   GLUCOSE 108*  --   BUN 5*  --   CREATININE 0.68 0.71  CALCIUM 8.9  --     Intake/Output Summary (Last 24 hours) at 07/03/2019 1745 Last data filed at 07/03/2019 0254 Gross per 24 hour  Intake 120 ml  Output 500 ml  Net -380 ml     Physical Exam: Vital Signs Blood pressure (!) 169/83, pulse 72, temperature 98.3 F (36.8 C), resp. rate 17, height 5\' 11"  (1.803 m), weight 66.3 kg, SpO2 100 %. General: No acute distress, lying in bed comfortably Mood and affect are appropriate Heart: Regular rate and rhythm no rubs murmurs or extra sounds Lungs: Clear to auscultation, breathing unlabored, no rales or wheezes Abdomen: Positive bowel sounds, soft nontender to palpation, nondistended Extremities: No clubbing, cyanosis, or edema Skin: left heel pressure area  Skin:  facial dermatitis improved   Neurologic: severe dysarthria. Cranial nerves II through XII intact, motor strength is 0/5 in Left  deltoid, bicep, tricep, grip, hip flexor, knee extensors, ankle dorsiflexor and plantar flexor 3/5 R biceps, triceps, 2- finger flexion and ext, trace R hip knee ext synergy Sensory exam normal sensation to light touch and proprioception in bilateral upper and lower extremities. LUE biceps tone 3/4. 1/4 wrist/finger flexors. Extensor tone 1-2/4 in LE's.  Cerebellar exam NA d/t weakness   Musculoskeletal:  No joint swelling  Photo 06/21/2019    06/28/2019    Assessment/Plan: 1. 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  Physiatrist is providing close team supervision and 24 hour management of active medical problems listed below.  Physiatrist and rehab team continue to assess barriers to discharge/monitor patient progress toward functional and medical goals  Care Tool:  Bathing        Body parts bathed by helper: Right arm, Left arm, Chest, Abdomen, Front perineal area, Buttocks, Right upper leg, Left upper leg, Right lower leg, Left lower leg, Face     Bathing assist Assist Level: Dependent - Patient 0%     Upper Body Dressing/Undressing Upper body dressing   What is the patient wearing?: Pull over shirt    Upper body assist Assist Level: 2 Helpers(sitting unsupported EOB)    Lower Body Dressing/Undressing Lower body dressing      What is the patient wearing?: Pants     Lower body assist Assist for lower body dressing: Dependent - Patient 0%     Toileting Toileting    Toileting assist Assist for toileting: 2 Helpers     Transfers Chair/bed transfer  Transfers assist  Chair/bed transfer activity did not occur: Safety/medical concerns  Chair/bed transfer assist level: Total Assistance - Patient < 25%(squat pivot)     Locomotion Ambulation   Ambulation assist   Ambulation activity did not occur: Safety/medical concerns  Assist level:  Total Assistance - Patient < 25% Assistive device: Lite Gait Max distance: 87ft   Walk 10 feet activity   Assist  Walk 10 feet activity did not occur: Safety/medical concerns  Assist level: Total Assistance - Patient < 25% Assistive device: Lite Gait   Walk 50 feet activity   Assist Walk 50 feet with 2 turns activity did not occur: Safety/medical concerns         Walk 150 feet activity   Assist Walk 150 feet activity did not occur: Safety/medical concerns          Walk 10 feet on uneven surface  activity   Assist Walk 10 feet on uneven surfaces activity did not occur: Safety/medical concerns         Wheelchair     Assist     Wheelchair activity did not occur: Safety/medical concerns         Wheelchair 50 feet with 2 turns activity    Assist    Wheelchair 50 feet with 2 turns activity did not occur: Safety/medical concerns       Wheelchair 150 feet activity     Assist  Wheelchair 150 feet activity did not occur: Safety/medical concerns       Blood pressure (!) 169/83, pulse 72, temperature 98.3 F (36.8 C), resp. rate 17, height 5\' 11"  (1.803 m), weight 66.3 kg, SpO2 100 %.  Medical Problem List and Plan: 1.Dysarthria/quadriparesis/aphasiasecondary to multifocal infarcts mostly posterior circulation but also bilateral anterior circulation cardioembolic pattern as well as innominate artery occlusion, left ICA thrombus versus soft plaque, right V4 and basal artery occlusive thrombus  Continue CIR, PT,  OT, speech therapy Hold off on PEG for now until we could see how he does with liquids, keep IV for fluids 2. Antithrombotics: -DVT/anticoagulation:Lovenox -antiplatelet therapy: Aspirin 325 mg and Plavix 75 mg daily- will not hold plavix unless PEG is scheduled  3. Pain Management:Tramadol as needed  Decubitus pain, off tramadol, ineffective, started oxyIR 5mg  Q6 prn, given hydrotherapy will add oxycontin 10mg  q 12 dose at 8 am to allow effect prior to hydrotherapy   -encourage re-positioning/OOB as much as possible, supportive seating   dental pain ? improved- left upper molar ,  No sign of abscess, + gingivitis, unable to do orthopantogram because of poor sitting balance 4. Mood:Wellbutrin 150 mg twice daily, may need trial of Nuedexta, expect PBA given extensive posterior > anterior circulation infarcts -antipsychotic agents: Seroquel 25 mg 3 times daily as needed 5. Neuropsych:  This patientis notcapable of making decisions on hisown behalf.  6. Skin/Wound Care:Routine skin checks- sacrococcygeal eschar, , cont foam with collagenase on eschar , appreciate general surgery note- cont collagenase daily , MASD improved  Seborrheic dermatitis - improved on valisone  7. Fluids/Electrolytes/Nutrition:Routine in and outs  BMP within acceptable range on 4/26  5/14 K+ is decreased to 3.3; will supplement with IV K+ 10 meq x6 and repeat BMP tomorrow.  8. Post stroke dysphagia. Dysphagia #1 honey thick liquids, advance diet as tolerated.    Consulted IR for PEG placement but will hold off for now given the upgrade in diet  Continue IVF, taking in solids fairly well.  9. Orthostatic hypotension. Monitor with increased mobility Vitals:   07/02/19 2019 07/03/19 0529  BP: (!) 139/99 (!) 169/83  Pulse: 89 72  Resp:  17  Temp: 98 F (36.7 C) 98.3 F (36.8 C)  SpO2: 99% 100%   Elevated add propanolol 10mg  daily  10. Hyperlipidemia. Lipitor 11. Tobacco abuse.  NicoDerm patch.  Counseled when possible 12. Leukocytosis. -Per CT abd  RLL atelectasis vs PNA, given pt afebrile , WBCs elevated but stable     5/8 no new cough, afebrile  -encourage OOB, aspriation precautions. Pt/wife aware  -continue to monitor, stable at 15.2 on 5/14 #13 chronic spasticity primarily affecting the left elbow flexors, knee flexors, ankle plantar flexor and hip adductor's.  Persist despite aggressive PT OT and tizanidine  Xeomin injected on 5/14 LOS: 21 days A FACE TO FACE EVALUATION WAS PERFORMED  Drema Pry Wm Sahagun 07/03/2019, 5:45 PM

## 2019-07-03 NOTE — Progress Notes (Signed)
Physical Therapy Wound Treatment Patient Details  Name: Adrian [name] MRN: [id number] Date of Birth: 1965-05-03  Today's Date: 07/03/2019 PT Individual Time: 0840-0919 PT Individual Time Calculation (min): 39 min   Subjective  Subjective: Pt family in room, inquiring into state of the wound.  Patient and Family Stated Goals: pain reduction Prior Treatments: dressing change with Santyl  Pain Score: 10/10 pain with pulse lavage and debridement, Pt requesting pain medication after session. RN notified.  Wound Assessment  Pressure Injury 06/12/19 Coccyx Medial;Upper Unstageable - Full thickness tissue loss in which the base of the injury is covered by slough (yellow, tan, gray, green or brown) and/or eschar (tan, brown or black) in the wound bed. wound is 2cm long by 0.5  (Active)  Wound Image   06/30/19 1500  Dressing Type ABD;Gauze (Comment);Barrier Film (skin prep);Moist to moist 07/03/19 1300  Dressing Changed;New drainage 07/03/19 1300  Dressing Change Frequency Daily 07/03/19 1300  State of Healing Eschar 07/03/19 1300  Site / Wound Assessment Black;Brown;Painful;Yellow;Red 07/03/19 1300  % Wound base Red or Granulating 70% 07/03/19 1300  % Wound base Black/Eschar 30% 07/03/19 1300  Peri-wound Assessment Maceration;Erythema (blanchable) 07/03/19 1300  Wound Length (cm) 5 cm 06/30/19 1500  Wound Width (cm) 3.2 cm 06/30/19 1500  Wound Depth (cm) 2.3 cm 06/30/19 1500  Wound Surface Area (cm^2) 16 cm^2 06/30/19 1500  Wound Volume (cm^3) 36.8 cm^3 06/30/19 1500  Tunneling (cm) 0.5 06/23/19 0846  Margins Unattached edges (unapproximated) 07/03/19 1300  Drainage Amount Moderate 07/03/19 1300  Drainage Description Serosanguineous;Green 07/03/19 1300  Treatment Debridement (Selective);Hydrotherapy (Pulse lavage);Other (Comment) 07/03/19 1300   Santyl applied to necrotic tissue  Pressure Injury 06/12/19 Buttocks Left Unstageable - Full thickness tissue loss in which the base of the injury  is covered by slough (yellow, tan, gray, green or brown) and/or eschar (tan, brown or black) in the wound bed. yellow covered wound bed (Active)  Dressing Type ABD;Barrier Film (skin prep);Moist to moist 07/03/19 1300  Dressing Changed;New drainage 07/03/19 1300  Dressing Change Frequency Daily 07/03/19 1300  State of Healing Other (Comment) 07/03/19 1300  Site / Wound Assessment Yellow 07/03/19 1300  % Wound base Yellow/Fibrinous Exudate 100% 07/03/19 1300  Peri-wound Assessment Erythema (non-blanchable);Pink 07/03/19 1300  Wound Length (cm) 1.2 cm 06/30/19 1500  Wound Width (cm) 1.8 cm 06/30/19 1500  Wound Depth (cm) 0 cm 06/27/19 0100  Wound Surface Area (cm^2) 2.16 cm^2 06/30/19 1500  Wound Volume (cm^3) 0 cm^3 06/27/19 0100  Margins Unattached edges (unapproximated) 07/03/19 1300  Drainage Amount Minimal 07/03/19 1300  Drainage Description Serous 07/03/19 1300  Treatment Cleansed 07/03/19 1300  Santyl applied to necrotic tissue Hydrotherapy Pulsed lavage therapy - wound location: Sacral and L buttock wounds Pulsed Lavage with Suction (psi): 12 psi Pulsed Lavage with Suction - Normal Saline Used: 1000 mL Pulsed Lavage Tip: Tip with splash shield Selective Debridement Selective Debridement - Location: sacral  Selective Debridement - Tools Used: Forceps;Scissors Selective Debridement - Tissue Removed: yellow stringy friable slough    Wound Assessment and Plan  Wound Therapy - Assess/Plan/Recommendations Wound Therapy - Clinical Statement: Changed dressing to Dakin's soaked Kling for treatment of possible Psuedomonas. Pt wound continues to have one small area of yellow, friable slough however to rest of the wound bed is exhibiting good granulation tissue. Reassess wound on 5/17 for possible discharge.  Wound Therapy - Functional Problem List: decreased mobility, and pt preference for lying on his back with minimal wound offloading Factors Delaying/Impairing Wound Healing:  Immobility;Multiple  medical problems Hydrotherapy Plan: Debridement;Patient/family education;Pulsatile lavage with suction Wound Therapy - Frequency: 6X / week Wound Therapy - Current Recommendations: WOC nurse Wound Therapy - Follow Up Recommendations: [place] Wound Plan: see above  Wound Therapy Goals- Improve the function of patient's integumentary system by progressing the wound(s) through the phases of wound healing (inflammation - proliferation - remodeling) by: Decrease Necrotic Tissue to: 20 Decrease Necrotic Tissue - Progress: Updated due to goal met Increase Granulation Tissue to: 80 Increase Granulation Tissue - Progress: Updated due to goal met Decrease Length/Width/Depth by (cm): 5 Decrease Length/Width/Depth - Progress: Progressing toward goal Goals/treatment plan/discharge plan were made with and agreed upon by patient/family: Yes Time For Goal Achievement: 7 days Wound Therapy - Potential for Goals: Good  Goals will be updated until maximal potential achieved or discharge criteria met.  Discharge criteria: when goals achieved, discharge from hospital, MD decision/surgical intervention, no progress towards goals, refusal/missing three consecutive treatments without notification or medical reason.  GP    Adrian [name]. Migdalia Dk PT, DPT Acute Rehabilitation Services Pager [phone] Office [phone]  [name] 07/03/2019, 1:30 PM

## 2019-07-04 ENCOUNTER — Inpatient Hospital Stay (HOSPITAL_COMMUNITY): Payer: Self-pay | Admitting: Physical Therapy

## 2019-07-04 LAB — BASIC METABOLIC PANEL
Anion gap: 10 (ref 5–15)
BUN: 6 mg/dL (ref 6–20)
CO2: 24 mmol/L (ref 22–32)
Calcium: 9 mg/dL (ref 8.9–10.3)
Chloride: 104 mmol/L (ref 98–111)
Creatinine, Ser: 0.71 mg/dL (ref 0.61–1.24)
GFR calc Af Amer: >60 mL/min (ref >60)
GFR calc non Af Amer: >60 mL/min (ref >60)
Glucose, Bld: 85 mg/dL (ref 70–99)
Potassium: 4 mmol/L (ref 3.5–5.1)
Sodium: 138 mmol/L (ref 135–145)

## 2019-07-04 MED ORDER — OXYCODONE HCL 5 MG PO TABS
5.0000 mg | ORAL_TABLET | ORAL | Status: DC | PRN
  Administered 2019-07-04 – 2019-07-08 (×9): 5 mg via ORAL
  Filled 2019-07-04 (×10): qty 1

## 2019-07-04 MED ORDER — PROPRANOLOL HCL 10 MG PO TABS
10.0000 mg | ORAL_TABLET | Freq: Two times a day (BID) | ORAL | Status: DC
  Administered 2019-07-04 – 2019-07-09 (×10): 10 mg via ORAL
  Filled 2019-07-04 (×10): qty 1

## 2019-07-04 NOTE — Progress Notes (Signed)
Physical Therapy Session Note  Patient Details  Name: Adrian [name] MRN: [id number] Date of Birth: 1965-05-22  Today's Date: 07/04/2019 PT Individual Time: 0909-1008 PT Individual Time Calculation (min): 59 min   Short Term Goals: Week 3:  PT Short Term Goal 1 (Week 3): Pt will perform supine<>sit with max assist of 1 PT Short Term Goal 2 (Week 3): Pt wil perform bed<>chair transfer with max assist of 1 PT Short Term Goal 3 (Week 3): Pt will perform sit<>stand with max assist of 1 using LRAD PT Short Term Goal 4 (Week 3): Pt will ambulate at least 74ft using LRAD with +2 max assist  Skilled Therapeutic Interventions/Progress Updates:    Pt received L sidelying in bed, asleep with his wife present reporting he was incontinent of bowels. Performed total assist LB clothing management and peri-care with RN present to change dressing. Pt fatigued and falling asleep during session due to recent pain medication administration. Therapy session focused on hands-on family training and education. Therapist educated on use of manual hoyer lift and sling to perform bed<>w/c transfers as pt's wife unable to provide more than min assist. Pt's wife and son performed demo transfer bed to chair using hoyer lift with therapist as patient. Educated on proper placement of sling via R/L rolling of patient. Educated on set-up of hoyer lift with brakes, legs, attachment of sling, raising and lowering patient in sling as well as use of hospital bed features to increase ease of placing sling. Pt's family completed transfer with min cuing from therapist. Pt's family reports financial barriers to affording equipment and therefore discussed speaking with SW and exploring alternative options. Pt's family plans to participate in additional hands-on training this week but report feeling confident with education provided today. Pt left L sidelying in bed with needs in reach, lines intact, and family present.  Therapy  Documentation Precautions:  Precautions Precautions: Fall Precaution Comments: sacral wound Restrictions Weight Bearing Restrictions: No  Therapy/Group: Individual Therapy  Ginny Forth, PT, DPT 07/04/2019, 7:51 AM

## 2019-07-04 NOTE — Progress Notes (Signed)
[place] PHYSICAL MEDICINE & REHABILITATION PROGRESS NOTE   Subjective/Complaints: Continues to have a lot of pain from pressure injury despite just receiving Oxycodone. Increased frequency to q4H.  K+ improved to 4 with supplementation  ROS: Patient denies CP, SOB N/V/D  + buttocks pain   Objective:   No results found. Recent Labs    07/02/19 1053  WBC 15.2*  HGB 13.3  HCT 40.0  PLT 390   Recent Labs    07/02/19 1053 07/02/19 1053 07/03/19 0655 07/04/19 0707  NA 140  --   --  138  K 3.3*  --   --  4.0  CL 102  --   --  104  CO2 25  --   --  24  GLUCOSE 108*  --   --  85  BUN 5*  --   --  6  CREATININE 0.68   < > 0.71 0.71  CALCIUM 8.9  --   --  9.0   < > = values in this interval not displayed.    Intake/Output Summary (Last 24 hours) at 07/04/2019 1102 Last data filed at 07/04/2019 0753 Gross per 24 hour  Intake 4048.55 ml  Output 1 ml  Net 4047.55 ml     Physical Exam: Vital Signs Blood pressure (!) 144/73, pulse 71, temperature 98.1 F (36.7 C), temperature source Oral, resp. rate 17, height 5\' 11"  (1.803 m), weight 66.3 kg, SpO2 98 %. General: No acute distress, lying in bed, restless from pain Mood and affect are appropriate Heart: Regular rate and rhythm no rubs murmurs or extra sounds Lungs: Clear to auscultation, breathing unlabored, no rales or wheezes Abdomen: Positive bowel sounds, soft nontender to palpation, nondistended Extremities: No clubbing, cyanosis, or edema Skin: left heel pressure area  Skin:  facial dermatitis improved   Neurologic: severe dysarthria. Cranial nerves II through XII intact, motor strength is 0/5 in Left  deltoid, bicep, tricep, grip, hip flexor, knee extensors, ankle dorsiflexor and plantar flexor 3/5 R biceps, triceps, 2- finger flexion and ext, trace R hip knee ext synergy Sensory exam normal sensation to light touch and proprioception in bilateral upper and lower extremities. LUE biceps tone 3/4. 1/4 wrist/finger  flexors. Extensor tone 1-2/4 in LE's.  Cerebellar exam NA d/t weakness   Musculoskeletal:  No joint swelling  Photo 06/21/2019    06/28/2019    Assessment/Plan: 1. 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  Physiatrist is providing close team supervision and 24 hour management of active medical problems listed below.  Physiatrist and rehab team continue to assess barriers to discharge/monitor patient progress toward functional and medical goals  Care Tool:  Bathing        Body parts bathed by helper: Right arm, Left arm, Chest, Abdomen, Front perineal area, Buttocks, Right upper leg, Left upper leg, Right lower leg, Left lower leg, Face     Bathing assist Assist Level: Dependent - Patient 0%     Upper Body Dressing/Undressing Upper body dressing   What is the patient wearing?: Pull over shirt    Upper body assist Assist Level: 2 Helpers(sitting unsupported EOB)    Lower Body Dressing/Undressing Lower body dressing      What is the patient wearing?: Pants     Lower body assist Assist for lower body dressing: Dependent - Patient 0%     Toileting Toileting    Toileting  assist Assist for toileting: 2 Helpers     Transfers Chair/bed transfer  Transfers assist  Chair/bed transfer activity did not occur: Safety/medical concerns  Chair/bed transfer assist level: Total Assistance - Patient < 25%(squat pivot)     Locomotion Ambulation   Ambulation assist   Ambulation activity did not occur: Safety/medical concerns  Assist level: Total Assistance - Patient < 25% Assistive device: Lite Gait Max distance: 43ft   Walk 10 feet activity   Assist  Walk 10 feet activity did not occur: Safety/medical concerns  Assist level: Total Assistance - Patient < 25% Assistive device: Lite Gait   Walk 50 feet activity   Assist  Walk 50 feet with 2 turns activity did not occur: Safety/medical concerns         Walk 150 feet activity   Assist Walk 150 feet activity did not occur: Safety/medical concerns         Walk 10 feet on uneven surface  activity   Assist Walk 10 feet on uneven surfaces activity did not occur: Safety/medical concerns         Wheelchair     Assist     Wheelchair activity did not occur: Safety/medical concerns         Wheelchair 50 feet with 2 turns activity    Assist    Wheelchair 50 feet with 2 turns activity did not occur: Safety/medical concerns       Wheelchair 150 feet activity     Assist  Wheelchair 150 feet activity did not occur: Safety/medical concerns       Blood pressure (!) 144/73, pulse 71, temperature 98.1 F (36.7 C), temperature source Oral, resp. rate 17, height 5\' 11"  (1.803 m), weight 66.3 kg, SpO2 98 %.  Medical Problem List and Plan: 1.Dysarthria/quadriparesis/aphasiasecondary to multifocal infarcts mostly posterior circulation but also bilateral anterior circulation cardioembolic pattern as well as innominate artery occlusion, left ICA thrombus versus soft plaque, right V4 and basal artery occlusive thrombus  Continue CIR, PT,  OT, speech therapy Hold off on PEG for now until we could see how he does with liquids, keep IV for fluids 2. Antithrombotics: -DVT/anticoagulation:Lovenox -antiplatelet therapy: Aspirin 325 mg and Plavix 75 mg daily- will not hold plavix unless PEG is scheduled  3. Pain Management:Tramadol as needed  Decubitus pain, off tramadol, ineffective, started oxyIR 5mg  Q6 prn, given hydrotherapy will add oxycontin 10mg  q 12 dose at 8 am to allow effect prior to hydrotherapy   -encourage re-positioning/OOB as much as possible, supportive seating   dental pain ? improved- left upper molar ,  No sign of abscess, + gingivitis, unable to do orthopantogram because of poor sitting balance  5/16:  Continues to have a lot of pain from pressure injury despite just receiving Oxycodone. Increased frequency to q4H.  4. Mood:Wellbutrin 150 mg twice daily, may need trial of Nuedexta, expect PBA given extensive posterior > anterior circulation infarcts -antipsychotic agents: Seroquel 25 mg 3 times daily as needed 5. Neuropsych: This patientis notcapable of making decisions on hisown behalf.  6. Skin/Wound Care:Routine skin checks- sacrococcygeal eschar, , cont foam with collagenase on eschar , appreciate general surgery note- continue collagenase daily , MASD improved  Seborrheic dermatitis - improved on valisone  7. Fluids/Electrolytes/Nutrition:Routine in and outs  BMP within acceptable range on 4/26  5/14 K+ is decreased to 3.3; will supplement with IV K+ 10 meq x6 and repeat BMP tomorrow.   K+ improved to 4 on 5/16 after supplementation 8. Post stroke dysphagia. Dysphagia #  1 honey thick liquids, advance diet as tolerated.    Consulted IR for PEG placement but will hold off for now given the upgrade in diet  Continue IVF, taking in solids fairly well.  9. Orthostatic hypotension. Monitor with increased mobility Vitals:   07/03/19 1937 07/04/19 0551  BP: (!) 167/88 (!) 144/73  Pulse: 85 71  Resp: 17   Temp: 98.3 F (36.8 C) 98.1 F (36.7 C)  SpO2: 99% 98%   5/16: Increased propanolol to 10mg  BID 10. Hyperlipidemia. Lipitor 11. Tobacco abuse. NicoDerm patch.  Counseled when possible 12. Leukocytosis. -Per CT abd  RLL atelectasis vs PNA, given pt afebrile , WBCs elevated but stable     5/8 no new cough, afebrile  -encourage OOB, aspriation precautions. Pt/wife aware  -continue to monitor, stable at 15.2 on 5/14 #13 chronic spasticity primarily affecting the left elbow flexors, knee flexors, ankle plantar flexor and hip adductor's.  Persist despite aggressive PT OT and tizanidine  Xeomin injected on 5/14  5/16: wife has noted improvements s/p  injection LOS: 22 days A FACE TO FACE EVALUATION WAS PERFORMED  Zebediah Beezley P Kale Rondeau 07/04/2019, 11:02 AM

## 2019-07-05 ENCOUNTER — Encounter (HOSPITAL_COMMUNITY): Payer: Self-pay | Admitting: Psychology

## 2019-07-05 ENCOUNTER — Inpatient Hospital Stay (HOSPITAL_COMMUNITY): Payer: Self-pay

## 2019-07-05 ENCOUNTER — Inpatient Hospital Stay (HOSPITAL_COMMUNITY): Payer: Self-pay | Admitting: Occupational Therapy

## 2019-07-05 DIAGNOSIS — G8110 Spastic hemiplegia affecting unspecified side: Secondary | ICD-10-CM

## 2019-07-05 NOTE — Progress Notes (Signed)
Patient refused placement of Prevalon boot states he "is not putting that thing on", encouraged but continue to refused boot to be placed

## 2019-07-05 NOTE — Progress Notes (Signed)
Physical Therapy Wound Treatment Patient Details  Name: Adrian [name] MRN: [id number] Date of Birth: June 02, 1965  Today's Date: 07/05/2019 PT Individual Time: 5784-6962 PT Individual Time Calculation (min): 20 min   Subjective  Subjective: Pt in room alone.  He is agreeable to session this am.  Patient and Family Stated Goals: pain reduction Prior Treatments: dressing change with Santyl  Pain Score: Pain Score: 0-No pain  Wound Assessment  Pressure Injury 06/12/19 Coccyx Medial;Upper Unstageable - Full thickness tissue loss in which the base of the injury is covered by slough (yellow, tan, gray, green or brown) and/or eschar (tan, brown or black) in the wound bed. wound is 2cm long by 0.5  (Active)  Dressing Type ABD;Gauze (Comment);Barrier Film (skin prep);Moist to moist 07/05/19 1443  Dressing Changed;New drainage 07/05/19 1443  Dressing Change Frequency Daily 07/05/19 1443  State of Healing Eschar 07/05/19 1443  Site / Wound Assessment Black;Brown;Painful;Yellow;Red 07/05/19 1443  % Wound base Red or Granulating 80% 07/05/19 1443  % Wound base Black/Eschar 20% 07/05/19 1443  Peri-wound Assessment Maceration;Erythema (blanchable) 07/05/19 1443  Wound Length (cm) 5 cm 06/30/19 1500  Wound Width (cm) 3.2 cm 06/30/19 1500  Wound Depth (cm) 2.3 cm 06/30/19 1500  Wound Surface Area (cm^2) 16 cm^2 06/30/19 1500  Wound Volume (cm^3) 36.8 cm^3 06/30/19 1500  Tunneling (cm) 0.5 06/23/19 0846  Margins Unattached edges (unapproximated) 07/05/19 1443  Drainage Amount Moderate 07/05/19 1443  Drainage Description Serosanguineous;Green 07/05/19 1443  Treatment Cleansed;Packing (Saline gauze);Hydrotherapy (Pulse lavage) 07/05/19 1443     Pressure Injury 06/12/19 Buttocks Left Unstageable - Full thickness tissue loss in which the base of the injury is covered by slough (yellow, tan, gray, green or brown) and/or eschar (tan, brown or black) in the wound bed. yellow covered wound bed (Active)   Dressing Type ABD;Barrier Film (skin prep);Moist to moist 07/05/19 1443  Dressing Changed;New drainage 07/05/19 1443  Dressing Change Frequency Daily 07/05/19 1443  State of Healing Other (Comment) 07/05/19 1443  Site / Wound Assessment Yellow 07/05/19 1443  % Wound base Yellow/Fibrinous Exudate 100% 07/05/19 1443  Peri-wound Assessment Erythema (non-blanchable);Pink 07/05/19 1443  Wound Length (cm) 1.2 cm 06/30/19 1500  Wound Width (cm) 1.8 cm 06/30/19 1500  Wound Depth (cm) 0 cm 06/27/19 0100  Wound Surface Area (cm^2) 2.16 cm^2 06/30/19 1500  Wound Volume (cm^3) 0 cm^3 06/27/19 0100  Margins Unattached edges (unapproximated) 07/05/19 1443  Drainage Amount Minimal 07/05/19 1443  Drainage Description Serous 07/05/19 1443  Treatment Hydrotherapy (Pulse lavage);Cleansed 07/05/19 1443  Dakins solution to wound packing.  This was the last time for Dakins.  Hydrotherapy Pulsed lavage therapy - wound location: Sacral and L buttock wounds Pulsed Lavage with Suction (psi): 12 psi Pulsed Lavage with Suction - Normal Saline Used: 1000 mL Pulsed Lavage Tip: Tip with splash shield Selective Debridement Selective Debridement - Location: sacral  Selective Debridement - Tools Used: Forceps Selective Debridement - Tissue Removed: yellow stringy friable slough    Wound Assessment and Plan  Wound Therapy - Assess/Plan/Recommendations Wound Therapy - Clinical Statement: Pt presents with hydro therapy with no evidence of blue green drainage.  Wound bed is clean and remains with adherent slough that could possibly be connective tissue.  Based on the wound presentation will inform supervisiong PT to sign off on the wound at this time.  Did place dakins x 1 more day per order and and informed nursing that we would not be back.  Supervising PT to address goals and d/c order.  Wound Therapy - Functional Problem List: decreased mobility, and pt preference for lying on his back with minimal wound  offloading Factors Delaying/Impairing Wound Healing: Immobility;Multiple medical problems Hydrotherapy Plan: Debridement;Patient/family education;Pulsatile lavage with suction Wound Therapy - Frequency: 6X / week Wound Therapy - Current Recommendations: WOC nurse Wound Therapy - Follow Up Recommendations: [place] Wound Plan: see above  Wound Therapy Goals- Improve the function of patient's integumentary system by progressing the wound(s) through the phases of wound healing (inflammation - proliferation - remodeling) by: Decrease Necrotic Tissue to: 20 Decrease Necrotic Tissue - Progress: Met Increase Granulation Tissue to: 80 Increase Granulation Tissue - Progress: Met Decrease Length/Width/Depth by (cm): 5 Decrease Length/Width/Depth - Progress: Progressing toward goal Goals/treatment plan/discharge plan were made with and agreed upon by patient/family: Yes Time For Goal Achievement: 7 days Wound Therapy - Potential for Goals: Good  Goals will be updated until maximal potential achieved or discharge criteria met.  Discharge criteria: when goals achieved, discharge from hospital, MD decision/surgical intervention, no progress towards goals, refusal/missing three consecutive treatments without notification or medical reason.  GP      Eli Hose 07/05/2019, 2:52 PM Erasmo Leventhal , PTA Acute Rehabilitation Services Pager [phone] Office [phone]

## 2019-07-05 NOTE — Progress Notes (Signed)
Occupational Therapy Session Note  Patient Details  Name: Adrian [name] MRN: [id number] Date of Birth: [birth year]  Today's Date: 07/05/2019 OT Individual Time: 1300-1418 OT Individual Time Calculation (min): 78 min    Short Term Goals: Week 3:  OT Short Term Goal 1 (Week 3): Pt will self feed with mod assist using the RUE with AE for 25% of meal. OT Short Term Goal 2 (Week 3): Pt will complete UB bathing with mod assist in supported sitting position. OT Short Term Goal 3 (Week 3): Pt will roll in the bed to the left side with max assist to help with supine to sit EOB or for donning LB clothing.  Skilled Therapeutic Interventions/Progress Updates:    Pt completed supine to sit EOB with max assist to start session.  Once on the side of the bed, he worked on Oncologist his Psychologist, occupational.  Max assist was needed for dynamic sitting balance with max assist as well for donning the shirt.  He was able to use the RUE to assist with donning it part of the way up his left arm as well as for helping to pull it over his head.  He then completed squat pivot transfer to the tilt in space wheelchair with total assist.  Took him down to the hallway outside of the therapy gym where he worked on functional mobility X2 with three muskateers technique.  He needed total assist +2 (pt 25%) for ambulation of 10' with each interval.  Increased LLE flexor and adductor tone was noted in the hamstrings.  Max assist was needed for advancement of both LEs with increased trunk and head flexion noted as well and decreased ability to maintain hip extension.  After two intervals, he requested to return to the bed in order to relieve pressure on his bottom.  Total assist was needed for stand pivot transfer to the bed with total +2 (pt 10%) for transition to sidelying.  Max assist was then needed to roll in the bed on his right side.  Rest of session focused on NMES to the left triceps as well as digit extensors.  Saebo Stim One was  applied to the left triceps for 10 mins on level 9 setting.  Pt was instructed to work on active elbow extension while stimulation was active.  Stimulation was then moved to the digit extensors for 10 mins as well on level 9 setting.  He was given target to reach for in sidelying with the LUE while stimulation was active as well.  Finished session with pt in the bed and call button and phone in reach with family present.    Therapy Documentation Precautions:  Precautions Precautions: Fall Precaution Comments: sacral wound Restrictions Weight Bearing Restrictions: No  Pain: Pain Assessment Pain Scale: Faces Faces Pain Scale: Hurts little more Pain Type: Acute pain Pain Location: Buttocks Pain Orientation: Lower Pain Descriptors / Indicators: Aching;Discomfort Pain Onset: With Activity Pain Intervention(s): Repositioned ADL: See Care Tool Section for some details of mobility and selfcare  Therapy/Group: Individual Therapy  Sherre Wooton OTR/L 07/05/2019, 4:31 PM

## 2019-07-05 NOTE — Consult Note (Signed)
Neuropsychological Consultation   Patient:   Adrian [name]   DOB:   [birth year]  MR Number:  [id number]  Location:  [facility] [address] CENTER [id number] [place] [address] Dept: [phone] Loc: [phone]           Date of Service:   07/05/2019  Start Time:   1 PM End Time:   2 PM  Provider/Observer:  Arley Phenix, Psy.D.       Clinical Neuropsychologist       Billing Code/Service: 44010  Chief Complaint:    Adrian [name]. Iddings is a 54 year old male with history of tobacco abuse and chronic low back pain maintained on Neurontin 600 mg 3 times a day as well as prednisone pack.  Patient with severe speech deficits both expressive and receptive as well as cognitive deficits.  Patient presented on 06/02/2019 with dysarthria and quadriparesis.  CT showed multiple bilateral infarcts.  Numerous small supratentorial and infratentorial acute infarcts involving both cerebral hemispheres, left thalamus, pons and right cerebellar hemisphere.  Chronic right frontoparietal cortical infarcts were also noted.  Follow-up MRI showed increase in size of bilateral acute infarcts of the pons.  Unchanged infarcts in the left thalamus, left caudate nucleus, right cerebellum and right frontal white matter.  There was no hemorrhage or mass-effect.  Patient did not receive TPA.  The patient has severe expressive aphasia and while the patient was only able to nod his head to indicate yes or no responses the family does report that there are times when he is able to get some more complete utterances out but they are limited and broken.  Patient has had a great deal of reactive mood states and indications of anxiety and depressive responses particularly when challenged with his severe motor deficits and limitations likely related to thalamic, pons and cortical infarcts along with his cerebellar infarcts.  Reason for Service:  The patient was  referred for neuropsychological consultation due to severe degree of cognitive and motor deficits and severe expressive language deficits along with significant mood state issues related to anxiety and depressive responses.  Below is the HPI for the current admission.  Adrian [name] is a 54 year old right-handed male with history of tobacco abuse and chronic low back pain maintained on Neurontin 600 mg 3 times dailyas well as prednisone pack.. History taken from chart review wife and son due to patient speech and cognition. Independent prior to admission. Presented 06/02/2019 with dysarthria andquadriparesis. Admission chemistries with WBC 21,000, BUN 24, urine drug screen negative. CT showed multiple bilateral infarcts. Per report numerous small supratentorial and infratentorial acute infarcts involving the cerebral hemispheres, left thalamus, pons and right cerebellar hemisphere. Chronic right frontoparietal cortical infarcts. Follow-up MRI showed slight increase in size of bilateral acute infarcts of the pons. Unchanged infarcts of the left thalamus left caudate nucleus, right cerebellum and right frontal white matter. No hemorrhage or mass-effect.. Patient did not receive TPA. 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 Occlusive thrombus also suspected within the very distal V4 left vertebral artery. Echocardiogram with ejection fraction of 70% grade 1 diastolic dysfunction. EEG negative for seizure.CTA head and neck repeated 06/09/2019 unchanged multivessel occlusion. Recommendations are DAPT of aspirin 325 mg and Plavix 75 mg daily. Patient had initially been maintained on intravenous heparin which is since been discontinued and  currently maintained on Lovenox. Dysphagia #1 honey thick liquid diet monitoring of hydration and still with ongoing secretion  management. Patient very labile as well as anxious and placed on Wellbutrin as well as the addition of Seroquel as needed. Bouts of orthostasis initially and did require fluid resuscitation. His Levophed has been discontinued. Noted leukocytosis as patient had been on initial prednisone x4 days prior to admission for chronic back painsince discontinued latest WBC 19,400 and latest chest x-ray no focal consolidation.Therapy evaluations completed and patient was admitted for a comprehensive rehab program.  Current Status:  The patient was alert with significant expressive language deficits due mainly to primary motor function deficits.  Can use arms to pull self to side to assist with rolling over.  Patient alert and memory for prior days events present.  Oriented and aware of limitations, but patient and family frustrated and not coping well with extended hospitalization and degree of patient's deficits.  Patient externalizing frustrations.  Patient needs max assist for many activities and wife is not able to do some things due to her small stature.  Son agitated and ready to help with patient but this is a very stressful event/situation for entire family.    Behavioral Observation: Adrian [name]  presents as a 54 y.o.-year-old Right Caucasian Male who appeared his stated age. his dress was Appropriate and he was Well Groomed and his manners were Appropriate to the situation.  his participation was indicative of Appropriate and Redirectable behaviors.  There were profound physical disabilities noted.  he displayed an appropriate level of cooperation and motivation.     Interactions:    Active Redirectable  Attention:   abnormal and attention span appeared shorter than expected for age  Memory:   within normal limits; patient with more awareness and remembering events of day.  Visuo-spatial:  not examined  Speech (Volume):  low  Speech:   non-fluent aphasia;   Thought Process:  Coherent and  Relevant  Though Content:  Rumination;   Orientation:   person, place and situation  Judgment:   Fair  Planning:   Poor  Affect:    Irritable and Tearful  Mood:    Dysphoric and Irritable  Insight:   Fair  Intelligence:   normal  Medical History:   Past Medical History:  Diagnosis Date  . Tobacco abuse    Psychiatric History:  No prior psychiatric history noted  Family Med/Psych History:  Family History  Problem Relation Age of Onset  . Hypertension Mother   . Hypertension Father      Impression/DX:  Demario Faniel. Trulson is a 54 year old male with history of tobacco abuse and chronic low back pain maintained on Neurontin 600 mg 3 times a day as well as prednisone pack.  Patient with severe speech deficits both expressive and receptive as well as cognitive deficits.  Patient presented on 06/02/2019 with dysarthria and quadriparesis.  CT showed multiple bilateral infarcts.  Numerous small supratentorial and infratentorial acute infarcts involving both cerebral hemispheres, left thalamus, pons and right cerebellar hemisphere.  Chronic right frontoparietal cortical infarcts were also noted.  Follow-up MRI showed increase in size of bilateral acute infarcts of the pons.  Unchanged infarcts in the left thalamus, left caudate nucleus, right cerebellum and right frontal white matter.  There was no hemorrhage or mass-effect.  Patient did not receive TPA.  The patient has severe expressive aphasia and while the patient was only able to nod his head to indicate yes or no  responses the family does report that there are times when he is able to get some more complete utterances out but they are limited and broken.  Patient has had a great deal of reactive mood states and indications of anxiety and depressive responses particularly when challenged with his severe motor deficits and limitations likely related to thalamic, pons and cortical infarcts along with his cerebellar infarcts.  The patient was  alert with significant expressive language deficits due mainly to primary motor function deficits.  Can use arms to pull self to side to assist with rolling over.  Patient alert and memory for prior days events present.  Oriented and aware of limitations, but patient and family frustrated and not coping well with extended hospitalization and degree of patient's deficits.  Patient externalizing frustrations.  Patient needs max assist for many activities and wife is not able to do some things due to her small stature.  Son agitated and ready to help with patient but this is a very stressful event/situation for entire family.   Disposition/Plan:  Will follow up with the patient later this week before discharge.  Worked on coping issues and concerns the family has about his care and what they will have to do once patient is discharged.  Diagnosis:    Cardioembolic stroke (HCC) - Plan: Ambulatory referral to Neurology  Lumbago - Plan: DG Lumbar Spine 2-3 Views, DG Lumbar Spine 2-3 Views  Dysphagia due to recent cerebrovascular accident - Plan: CANCELED: IR Radiologist Eval & Mgmt, CANCELED: IR Radiologist Eval & Mgmt, CANCELED: IR GASTROSTOMY TUBE MOD SED, CANCELED: IR GASTROSTOMY TUBE MOD SED  Pain, dental - Plan: CANCELED: DG Orthopantogram, CANCELED: DG Orthopantogram         Electronically Signed   _______________________ Arley Phenix, Psy.D.

## 2019-07-05 NOTE — Progress Notes (Signed)
Physical Therapy Session Note  Patient Details  Name: Adrian [name] MRN: [id number] Date of Birth: August 01, 1965  Today's Date: 07/05/2019 PT Individual Time: 1015-1100 PT Individual Time Calculation (min): 45 min   and Today's Date: 07/05/2019 PT Missed Time: 15 Minutes Missed Time Reason: Patient unwilling to participate;Pain  Short Term Goals: Week 3:  PT Short Term Goal 1 (Week 3): Pt will perform supine<>sit with max assist of 1 PT Short Term Goal 2 (Week 3): Pt wil perform bed<>chair transfer with max assist of 1 PT Short Term Goal 3 (Week 3): Pt will perform sit<>stand with max assist of 1 using LRAD PT Short Term Goal 4 (Week 3): Pt will ambulate at least 57ft using LRAD with +2 max assist  Skilled Therapeutic Interventions/Progress Updates:    Pt supine in bed upon PT arrival, pt reports sacral wound pain and initially declining therapy. With max encouragement pt agreeable to start with bed level exercises and stretching for ROM/strengthening. Pt transferred to sidelying for pressure relief with mod assist. In sidelying performed the following exercises bilaterally for neuro re-ed/strengthening, 2 x 10 each - hip flexion AROM, hip extension against manual resistance, hip abduction AAROM, knee flexion AROM, knee extension AROM and against slight manual resistance. Therapist performed hamstring, hip adductor and heel cord stretching bilaterally. With encouragement pt willing to work on standing, he reports "If I stand will you be quiet?" Pt performed sidelying>sitting max assist. Pt performed x 2 sit<>stands this session with max +2 assist, improved quad activation and knee extension bilaterally in standing, continues to require facilitation for hip extension. Pt ambulated x 56ft forwards/backwards with max+2 assist using 3 musketeers technique for UE support, improved knee extension noted, continues to require assist for foot placement (to limit excess adduction). Pt declines further activity,  "lay me down." Transferred to supine with max assist and left with needs in reach and family present. Therapist continued to provided education regarding importance of full participation in therapies and OOB activity, pt resistant. Pt missed 15 minutes of skilled therapy tx secondary to unwilling to participate.   Therapy Documentation Precautions:  Precautions Precautions: Fall Precaution Comments: sacral wound Restrictions Weight Bearing Restrictions: No    Therapy/Group: Individual Therapy  Cresenciano Genre, PT, DPT, CSRS 07/05/2019, 7:48 AM

## 2019-07-05 NOTE — Progress Notes (Signed)
[place] PHYSICAL MEDICINE & REHABILITATION PROGRESS NOTE   Subjective/Complaints: Patient seen laying states he slept well overnight.  He states he had a good weekend.  He jokingly states he would like for me to make it Friday.  ROS: Denies CP, SOB N/V/D   Objective:   No results found. No results for input(s): WBC, HGB, HCT, PLT in the last 72 hours. Recent Labs    07/03/19 0655 07/04/19 0707  NA  --  138  K  --  4.0  CL  --  104  CO2  --  24  GLUCOSE  --  85  BUN  --  6  CREATININE 0.71 0.71  CALCIUM  --  9.0    Intake/Output Summary (Last 24 hours) at 07/05/2019 1256 Last data filed at 07/05/2019 0740 Gross per 24 hour  Intake 1400 ml  Output 250 ml  Net 1150 ml     Physical Exam: Vital Signs Blood pressure (!) 146/92, pulse 71, temperature 98 F (36.7 C), temperature source Oral, resp. rate 17, height 5\' 11"  (1.803 m), weight 66.3 kg, SpO2 98 %. Constitutional: No distress . Vital signs reviewed. HENT: Normocephalic.  Atraumatic. Eyes: EOMI. No discharge. Cardiovascular: No JVD. Respiratory: Normal effort.  No stridor. GI: Non-distended. Skin: Left heel pressure ulcer not examined today. Psych: Normal mood.  Normal behavior. Musc: No edema in extremities.  No tenderness in extremities. Neurologic: Alert Dysarthria, improving Motor: LUE: Shoulder abduction 3 -/5, distally 3+/5  Left lower extremity: 2-/5 hip flexion, knee extension RUE: 4/5 proximal distal  Assessment/Plan: 1. 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  Physiatrist is providing close team supervision and 24 hour management of active medical problems listed below.  Physiatrist and rehab team continue to assess barriers to discharge/monitor patient progress toward functional and medical goals  Care Tool:  Bathing        Body parts bathed by  helper: Right arm, Left arm, Chest, Abdomen, Front perineal area, Buttocks, Right upper leg, Left upper leg, Right lower leg, Left lower leg, Face     Bathing assist Assist Level: Dependent - Patient 0%     Upper Body Dressing/Undressing Upper body dressing   What is the patient wearing?: Pull over shirt    Upper body assist Assist Level: 2 Helpers(sitting unsupported EOB)    Lower Body Dressing/Undressing Lower body dressing      What is the patient wearing?: Pants     Lower body assist Assist for lower body dressing: Dependent - Patient 0%     Toileting Toileting    Toileting assist Assist for toileting: 2 Helpers     Transfers Chair/bed transfer  Transfers assist  Chair/bed transfer activity did not occur: Safety/medical concerns  Chair/bed transfer assist level: Total Assistance - Patient < 25%(squat pivot)     Locomotion Ambulation   Ambulation assist   Ambulation activity did not occur: Safety/medical concerns  Assist level: 2 helpers Assistive device: (3 musketeers) Max distance: 3 ft   Walk 10 feet activity   Assist  Walk 10 feet activity did not occur: Safety/medical concerns  Assist level: Total Assistance - Patient < 25% Assistive device: Lite Gait   Walk 50 feet activity   Assist Walk 50 feet with 2 turns activity did not occur: Safety/medical concerns         Walk 150 feet activity   Assist Walk 150  feet activity did not occur: Safety/medical concerns         Walk 10 feet on uneven surface  activity   Assist Walk 10 feet on uneven surfaces activity did not occur: Safety/medical Engineer, technical sales activity did not occur: Safety/medical concerns         Wheelchair 50 feet with 2 turns activity    Assist    Wheelchair 50 feet with 2 turns activity did not occur: Safety/medical concerns       Wheelchair 150 feet activity     Assist  Wheelchair 150 feet  activity did not occur: Safety/medical concerns       Blood pressure (!) 146/92, pulse 71, temperature 98 F (36.7 C), temperature source Oral, resp. rate 17, height 5\' 11"  (1.803 m), weight 66.3 kg, SpO2 98 %.  Medical Problem List and Plan: 1.Dysarthria/quadriparesis/aphasiasecondary to multifocal infarcts mostly posterior circulation but also bilateral anterior circulation cardioembolic pattern as well as innominate artery occlusion, left ICA thrombus versus soft plaque, right V4 and basal artery occlusive thrombus  Continue CIR 2. Antithrombotics: -DVT/anticoagulation:Lovenox -antiplatelet therapy: Aspirin 325 mg and Plavix 75 mg daily- will not hold plavix unless PEG is scheduled  3. Pain Management:Tramadol as needed  Decubitus pain, off tramadol, ineffective, started oxyIR 5mg  Q6 prn, increased to every 4 on 5/16  Given hydrotherapy added oxycontin 10mg  q 12 dose at 8 am to allow effect prior to hydrotherapy   -encourage re-positioning/OOB as much as possible, supportive seating   dental pain ? improved- left upper molar ,  No sign of abscess, + gingivitis, unable to do orthopantogram because of poor sitting balance 4. Mood:Wellbutrin 150 mg twice daily, may need trial of Nuedexta, expect PBA given extensive posterior > anterior circulation infarcts -antipsychotic agents: Seroquel 25 mg 3 times daily as needed 5. Neuropsych: This patientis notcapable of making decisions on hisown behalf.  6. Skin/Wound Care:Routine skin checks- sacrococcygeal eschar, cont foam with collagenase on eschar , appreciate general surgery note- continue collagenase daily , MASD improved  Seborrheic dermatitis - improved on valisone  7. Fluids/Electrolytes/Nutrition:Routine in and outs  BMP within acceptable range on 5/16 8. Post stroke dysphagia.   D3 thins, continue to advance as tolerated  Consulted IR for PEG placement but will hold off for now given the upgrade in  diet  Continue IVF  Oral intake improving 9. Orthostatic hypotension. Monitor with increased mobility Vitals:   07/04/19 2011 07/05/19 0536  BP: (!) 166/83 (!) 146/92  Pulse: 76 71  Resp: 17 17  Temp: 98.5 F (36.9 C) 98 F (36.7 C)  SpO2: 98% 98%   Increased propanolol to 10mg  BID on 5/16, monitor to avoid hypoperfusion 10. Hyperlipidemia. Lipitor 11. Tobacco abuse. NicoDerm patch.  Counseled when possible 12. Leukocytosis. -Per CT abd  RLL atelectasis vs PNA,   afebrile   WBCs 15.2 on 5/14, labs ordered for tomorrow 13. Chronic spasticity primarily affecting the left elbow flexors, knee flexors, ankle plantar flexor and hip adductor's.    Xeomin injected on 5/14  Cont ROM LOS: 23 days A FACE TO FACE EVALUATION WAS PERFORMED  Breon Diss 07/05/2019, 12:56 PM

## 2019-07-05 NOTE — Progress Notes (Signed)
Speech Language Pathology Daily Session Note  Patient Details  Name: Adrian [name] MRN: [id number] Date of Birth: 1965/09/19  Today's Date: 07/05/2019 SLP Individual Time: 1131-1200 SLP Individual Time Calculation (min): 29 min  Short Term Goals: Week 3: SLP Short Term Goal 1 (Week 3): Pt will participate in MBSS to assess potential for liquid and solid advancement. SLP Short Term Goal 2 (Week 3): Pt will consume trials of upgraded solids with functional mastication and full oral clearance with Mod A cues for use of swallow strategies. SLP Short Term Goal 3 (Week 3): Pt will verbalize at the word level to achieve 50% intelligibility with Mod A multimodal cueing for intelligibility strategies. SLP Short Term Goal 4 (Week 3): Pt will sustain attention to functional tasks for 5-7 minutes with Mod A multimodal cues. SLP Short Term Goal 5 (Week 3): Pt will demonstrate ability to problem solve basic and functional situations with Mod A multimodal cues.  Skilled Therapeutic Interventions: Skilled ST services focused on education and swallow skills. Pt's son and wife were present. Pt agreed to sit/recline on left side in bed at 45 degree angle for PO consumption, SLP and pt's wife adjusted positioning in bed. SLP facilitated PO consumption of regular texture and thin via straw lunch tray, however items pt mainly consumed were of dys 1 and dys 3 textures. Pt requested his wife feed him and express wants/needs with max A verbal cues to utilize  2-3 word phrase for effective communication due to severe dysarthria. Pt demonstrated no overt s/s and required mod A verbal cues to slow rate and clear oral cavity piror to verbalization. SLP provided education to pt and family pertaining to dys 3 diet (handout provided), in which SLP downgrade to due to realistic position during PO consumption and to reduce risk of apsiration. Pt's wife is signed off to assist with feeding and provided supervision on current diet. All  questions answered to satisfaction. Pt was left in room with family, call bell within reach and bed alarm set. ST recommends to continue skilled ST services.      Pain Pain Assessment Pain Score: 0-No pain  Therapy/Group: Individual Therapy  Marlon Vonruden  [facility] 07/05/2019, 12:28 PM

## 2019-07-06 ENCOUNTER — Inpatient Hospital Stay (HOSPITAL_COMMUNITY): Payer: Self-pay | Admitting: Physical Therapy

## 2019-07-06 ENCOUNTER — Inpatient Hospital Stay (HOSPITAL_COMMUNITY): Payer: Self-pay | Admitting: Speech Pathology

## 2019-07-06 ENCOUNTER — Inpatient Hospital Stay (HOSPITAL_COMMUNITY): Payer: Self-pay | Admitting: Occupational Therapy

## 2019-07-06 LAB — CBC WITH DIFFERENTIAL/PLATELET
Abs Immature Granulocytes: 0.07 10*3/uL (ref 0.00–0.07)
Basophils Absolute: 0.1 10*3/uL (ref 0.0–0.1)
Basophils Relative: 1 %
Eosinophils Absolute: 0.2 10*3/uL (ref 0.0–0.5)
Eosinophils Relative: 2 %
HCT: 38.3 % — ABNORMAL LOW (ref 39.0–52.0)
Hemoglobin: 12.7 g/dL — ABNORMAL LOW (ref 13.0–17.0)
Immature Granulocytes: 1 %
Lymphocytes Relative: 38 %
Lymphs Abs: 4.8 10*3/uL — ABNORMAL HIGH (ref 0.7–4.0)
MCH: 30.4 pg (ref 26.0–34.0)
MCHC: 33.2 g/dL (ref 30.0–36.0)
MCV: 91.6 fL (ref 80.0–100.0)
Monocytes Absolute: 1.1 10*3/uL — ABNORMAL HIGH (ref 0.1–1.0)
Monocytes Relative: 9 %
Neutro Abs: 6.3 10*3/uL (ref 1.7–7.7)
Neutrophils Relative %: 49 %
Platelets: 381 10*3/uL (ref 150–400)
RBC: 4.18 MIL/uL — ABNORMAL LOW (ref 4.22–5.81)
RDW: 14.4 % (ref 11.5–15.5)
WBC: 12.5 10*3/uL — ABNORMAL HIGH (ref 4.0–10.5)
nRBC: 0 % (ref 0.0–0.2)

## 2019-07-06 NOTE — Progress Notes (Addendum)
Physical Therapy Wound Care Discharge Patient Details Name: Adrian [name]. [name]  MRN: [id number] DOB: 1965-10-27 Today's Date: 07/06/2019 Time: 8:20 AM    Per surgical note 07/02/19, patient suitable to discharged from [facility] services secondary to goals met and no further PT Hydrotherapy needs identified.  Please see latest therapy progress note for current level of wound healing and meeting wound care goals.  Pt to return to nursing care of wound with daily dressing change and Santyl treatment to yellow slough tissue. RN staff notified.   Progress and discharge plan discussed with patient and/or caregiver: Patient/Caregiver agrees with plan.   Nevia Henkin B. Migdalia Dk PT, DPT Acute Rehabilitation Services Pager [phone] Office [phone]   [place] 07/06/2019, 8:18 AM

## 2019-07-06 NOTE — Plan of Care (Signed)
  Problem: RH Wheelchair Mobility Goal: LTG Patient will propel w/c in controlled environment (PT) Description: LTG: Patient will propel wheelchair in controlled environment, # of feet with assist (PT) Outcome: Not Applicable Flowsheets (Taken 07/06/2019 1832) LTG: Pt will propel w/c in controlled environ  assist needed:: (pt will not functionally propel wheelchair) -- Note: pt will not functionally propel wheelchair   Problem: RH Balance Goal: LTG Patient will maintain dynamic sitting balance (PT) Description: LTG:  Patient will maintain dynamic sitting balance with assistance during mobility activities (PT) Flowsheets (Taken 07/06/2019 1832) LTG: Pt will maintain dynamic sitting balance during mobility activities with:: (downgraded due to slower than anticipated progress) Maximal Assistance - Patient 25 - 49% Note: downgraded due to slower than anticipated progress   Problem: Sit to Stand Goal: LTG:  Patient will perform sit to stand with assistance level (PT) Description: LTG:  Patient will perform sit to stand with assistance level (PT) Flowsheets (Taken 07/06/2019 1832) LTG: PT will perform sit to stand in preparation for functional mobility with assistance level: (downgraded due to slower than anticipated progress) Maximal Assistance - Patient 25 - 49% Note: downgraded due to slower than anticipated progress   Problem: RH Bed Mobility Goal: LTG Patient will perform bed mobility with assist (PT) Description: LTG: Patient will perform bed mobility with assistance, with/without cues (PT). Flowsheets (Taken 07/06/2019 1832) LTG: Pt will perform bed mobility with assistance level of: (downgraded due to slower than anticipated progress) Maximal Assistance - Patient 25 - 49% Note: downgraded due to slower than anticipated progress   Problem: RH Bed to Chair Transfers Goal: LTG Patient will perform bed/chair transfers w/assist (PT) Description: LTG: Patient will perform bed to chair  transfers with assistance (PT). Flowsheets (Taken 07/06/2019 1832) LTG: Pt will perform Bed to Chair Transfers with assistance level: (downgraded due to slower than anticipated progress) Maximal Assistance - Patient 25 - 49% Note: downgraded due to slower than anticipated progress   Problem: RH Car Transfers Goal: LTG Patient will perform car transfers with assist (PT) Description: LTG: Patient will perform car transfers with assistance (PT). Flowsheets (Taken 07/06/2019 1832) LTG: Pt will perform car transfers with assist:: (downgraded due to slower than anticipated progress) Total Assistance - Patient < 25% Note: downgraded due to slower than anticipated progress   Problem: RH Ambulation Goal: LTG Patient will ambulate in controlled environment (PT) Description: LTG: Patient will ambulate in a controlled environment, # of feet with assistance (PT). Flowsheets (Taken 07/06/2019 1832) LTG: Pt will ambulate in controlled environ  assist needed:: (downgraded due to slower than anticipated progress) Total Assistance - Patient < 25% LTG: Ambulation distance in controlled environment: 76ft using LRAD with assist from therapist only Note: downgraded due to slower than anticipated progress

## 2019-07-06 NOTE — Progress Notes (Signed)
Occupational Therapy Session Note  Patient Details  Name: Adrian [name] MRN: [id number] Date of Birth: 06/07/1965  Today's Date: 07/06/2019 OT Individual Time: 4627-0350 OT Individual Time Calculation (min): 74 min    Short Term Goals: Week 3:  OT Short Term Goal 1 (Week 3): Pt will self feed with mod assist using the RUE with AE for 25% of meal. OT Short Term Goal 2 (Week 3): Pt will complete UB bathing with mod assist in supported sitting position. OT Short Term Goal 3 (Week 3): Pt will roll in the bed to the left side with max assist to help with supine to sit EOB or for donning LB clothing.  Skilled Therapeutic Interventions/Progress Updates:    Pt in bed to start session agreeable to getting up to the wheelchair and out of the room.  He needed max assist in sidelying for use of the urinal with spouse assisting.  Max assist was needed for rolling to the left of the bed with min assist to the right, with use of the bed rails for support.  He was able to transfer to sitting from the left side of the bed with total assist.  Total assist was also needed for transfer to the wheelchair squat pivot.  He was immediately tilted back in the wheelchair for pain relief.  Therapist then took him down to the hallway in front of the therapy gym where he worked on functional mobility X 2 with distances of 67' and 28' with total +2 (pt 30%).  He was able to advance the RLE for 50% of steps with no more than mod assist.  He needed more max assist as he fatigued.  He needed total assist for advancing the LLE with increased tone still noted in the hamstrings and adductors, but still less than on previous day's session.  Once the second walk was completed, pt requested return to the bed in order to relieve the pain in his sacrum.  He was able to transfer back to the bed with total assist stand pivot and to left sidelying at the same level.  He transferred over to the right side of the bed with min assist using the  rail.  Focused rest of the session on LUE activation of the elbow extension with NMES  Saebo Stim One active as well as activating digit extension and functional reaching.  He was able to tolerate 15 mins total of active stimulation to the elbow and the digits without any adverse reactions.  Settings were on level 8 clicks with the parameters listed below.  Pt needed max hand over hand assist to reach and pick up his mug with the left hand from sidelying with HOB elevated above 30 degrees, then he could bring it to his mouth to drink with min assist.  Finished session with pt in the bed and family present for support and to assist with his lunch.    Saebo Stim One 330 pulse width 35 Hz pulse rate On 8 sec/ off 8 sec Ramp up/ down 2 sec Symmetrical Biphasic wave form  Max intensity at 500 Ohm load   Therapy Documentation Precautions:  Precautions Precautions: Fall Precaution Comments: sacral wound Restrictions Weight Bearing Restrictions: No  Pain: Pain Assessment Pain Scale: 0-10 Pain Score: 9  Faces Pain Scale: Hurts little more Pain Type: Acute pain Pain Location: Sacrum Pain Orientation: Lower Pain Descriptors / Indicators: Aching Pain Frequency: Constant Pain Onset: On-going Pain Intervention(s): Medication (See eMAR)(oxycodone given) ADL: See  Care Tool Section for some details of mobility and selfcare  Therapy/Group: Individual Therapy  MCGUIRE,JAMES OTR/L 07/06/2019, 4:14 PM

## 2019-07-06 NOTE — Progress Notes (Signed)
Speech Language Pathology Daily Session Note  Patient Details  Name: JIA MOHAMED MRN: [id number] Date of Birth: 1965-12-09  Today's Date: 07/06/2019 SLP Individual Time: 0730-0759 SLP Individual Time Calculation (min): 29 min  Short Term Goals: Week 3: SLP Short Term Goal 1 (Week 3): Pt will participate in MBSS to assess potential for liquid and solid advancement. SLP Short Term Goal 2 (Week 3): Pt will consume trials of upgraded solids with functional mastication and full oral clearance with Mod A cues for use of swallow strategies. SLP Short Term Goal 3 (Week 3): Pt will verbalize at the word level to achieve 50% intelligibility with Mod A multimodal cueing for intelligibility strategies. SLP Short Term Goal 4 (Week 3): Pt will sustain attention to functional tasks for 5-7 minutes with Mod A multimodal cues. SLP Short Term Goal 5 (Week 3): Pt will demonstrate ability to problem solve basic and functional situations with Mod A multimodal cues.  Skilled Therapeutic Interventions: Pt was seen for skilled ST targeting cognitive-linguistic goals. Pt's wife was present throughout session. SLP provided Moderate multimodal cues for functional problem solving and sustained attention during breakfast intake. He did require encouragement and extra time to initiate PO intake on arrival. Max A verbal cues for implementation of slower rate and increased vocal intensity required to increase intelligibility during functional conversation today. Pt left semi-reclined in bed with wife present and needs met to their satisfaction. Continue per current plan of care.        Pain Pain Assessment Pain Scale: Faces Faces Pain Scale: Hurts little more Pain Type: Acute pain Pain Location: Sacrum Pain Descriptors / Indicators: Grimacing;Aching Pain Onset: On-going Pain Intervention(s): Repositioned;Distraction Multiple Pain Sites: No  Therapy/Group: Individual Therapy  Arbutus Leas 07/06/2019, 6:54 AM

## 2019-07-06 NOTE — Progress Notes (Signed)
Physical Therapy Weekly Progress Note  Patient Details  Name: Adrian [name] MRN: [id number] Date of Birth: 1966-01-27  Beginning of progress report period: Jun 29, 2019 End of progress report period: Jul 06, 2019  Today's Date: 07/06/2019 PT Individual Time: 1102-1200 PT Individual Time Calculation (min): 58 min   Patient has met 0 of 4 short term goals.  Mr. Pariseau continues to make slow progress with therapy due to sacral wound pain and significant impairments from bilateral CVAs. He continues to demonstrate increased B LE flexor and adductor tone although botox injections have improved L LE tone. He requires max/total assist for supine<>sit with pt demonstrating improved ability to sit up on R EOB with ability to bring B LEs off EOB and push with R UE for trunk upright. Sit<>stands with max/total assist for 1 and stand pivot transfer w/c<>EOB with max/total assist of 1 for lifting and pivoting hips. He has progressed to overground gait training ~92f via 3 Musketeer support and max assist while pt able to bring B LEs through during swing with assist to increase step length and abduct hip due to tone causing hip adduction/scissoring. Pt's family has initiated hands-on education and training in preparation for discharge.   Patient continues to demonstrate the following deficits muscle weakness, muscle joint tightness and muscle paralysis, decreased cardiorespiratoy endurance, impaired timing and sequencing, abnormal tone, unbalanced muscle activation, motor apraxia, decreased coordination and decreased motor planning, decreased initiation, decreased attention, decreased awareness, decreased problem solving, decreased safety awareness, decreased memory and delayed processing and decreased sitting balance, decreased standing balance, decreased postural control and decreased balance strategies and therefore will continue to benefit from skilled PT intervention to increase functional independence with  mobility.  Patient not progressing toward long term goals.  See goal revision. LTGs downgraded due to slower than anticipated progress.  Continue plan of care.  PT Short Term Goals Week 3:  PT Short Term Goal 1 (Week 3): Pt will perform supine<>sit with max assist of 1 PT Short Term Goal 1 - Progress (Week 3): Partly met PT Short Term Goal 2 (Week 3): Pt wil perform bed<>chair transfer with max assist of 1 PT Short Term Goal 2 - Progress (Week 3): Partly met PT Short Term Goal 3 (Week 3): Pt will perform sit<>stand with max assist of 1 using LRAD PT Short Term Goal 3 - Progress (Week 3): Partly met PT Short Term Goal 4 (Week 3): Pt will ambulate at least 438fusing LRAD with +2 max assist PT Short Term Goal 4 - Progress (Week 3): Partly met Week 4:  PT Short Term Goal 1 (Week 4): = to LTGs based on ELOS  Skilled Therapeutic Interventions/Progress Updates:  Ambulation/gait training;Community reintegration;DME/adaptive equipment instruction;Neuromuscular re-education;Psychosocial support;Stair training;UE/LE Strength taining/ROM;Wheelchair propulsion/positioning;Balance/vestibular training;Discharge planning;Pain management;Functional electrical stimulation;Skin care/wound management;Therapeutic Activities;UE/LE Coordination activities;Cognitive remediation/compensation;Disease management/prevention;Functional mobility training;Patient/family education;Splinting/orthotics;Therapeutic Exercise;Visual/perceptual remediation/compensation    Pt received R sidelying in bed with his wife and son present and pt agreeable to therapy session. [employer] Ina[employer], present with loaner TIS w/c. This TIS w/c has roho cushion for pressure relief and deep contour back with lateral support for trunk control. Sidelying>sitting EOB with max/total assist due to sacral wound pain and pt guarding movement; however, pt able to bring B LEs off EOB and use R UE to assist with trunk upright. L stand pivot to w/c with  max/total assist and pt demonstrating increasing ability to lift up into standing. Pt demonstrates improved L UE and LE movement this week (anticipate  this is due to the botox injections). Therapist used pressure mapping system to assess loaner Roho cushion fit with Peak Pressure averaging around ~4-3 demonstrating good pressure relief. Jason adjusted w/c head rest for improved fit and cervical spine alignment. Reinforced education regarding importance of tilting backwards for pressure relief every 75mnutes. Pt agreeable to leave room in chair and ambulate. Gait training ~380fwith 3 Musketeer UE support providing +2 max assist and +3 assist for w/c follow and IV pole management - pt continues to have B LE flexor and adductor tone but is significantly improved in L LE compared to prior - pt able to step B LEs forward requiring min assist for increased L LE hip abduction to avoid scissoring and max assist for bringing R LE forward and abducted. Pt declines further ambulation due to increased sacral wound pain and requests to return to bed. R stand pivot to EOB with max assist for lifting/pivoting hips. Sit>R sidelying with total assist for trunk descent and B LE management into the bed again requiring increased assist due to pain causing guarded movement. Pt's family reports they have received the hospital bed and hoyer lift at home and [employer] to pick up the loaner TIS w/c and deliver to pt's home on Thursday evening prior to D/C home on Friday. Pt left sidelying in bed with needs in reach and pt's family present.  Therapy Documentation Precautions:  Precautions Precautions: Fall Precaution Comments: sacral wound Restrictions Weight Bearing Restrictions: No  Pain:   Continues to have sacral wound pain but pt able to tolerate OOB activity this session.  Therapy/Group: Individual Therapy  CaTawana ScalePT, DPT 07/06/2019, 8:01 AM

## 2019-07-07 ENCOUNTER — Inpatient Hospital Stay (HOSPITAL_COMMUNITY): Payer: Self-pay | Admitting: Speech Pathology

## 2019-07-07 ENCOUNTER — Inpatient Hospital Stay (HOSPITAL_COMMUNITY): Payer: Self-pay | Admitting: Occupational Therapy

## 2019-07-07 ENCOUNTER — Inpatient Hospital Stay (HOSPITAL_COMMUNITY): Payer: Self-pay

## 2019-07-07 MED ORDER — NICOTINE 14 MG/24HR TD PT24
14.0000 mg | MEDICATED_PATCH | Freq: Every day | TRANSDERMAL | Status: DC
  Administered 2019-07-07 – 2019-07-09 (×3): 14 mg via TRANSDERMAL
  Filled 2019-07-07 (×3): qty 1

## 2019-07-07 NOTE — Patient Care Conference (Signed)
Inpatient RehabilitationTeam Conference and Plan of Care Update Date: 07/07/2019   Time: 1:25 PM    Patient Name: Adrian [name]      Medical Record Number: [id number]  Date of Birth: [birth year] Sex: Male         Room/Bed: 4W16C/4W16C-02 Payor Info: Payor: MEDICAID POTENTIAL / Plan: MEDICAID POTENTIAL / Product Type: *No Product type* /    Admit Date/Time:  06/12/2019  4:39 PM  Primary Diagnosis:  Basilar artery occlusion  Patient Active Problem List   Diagnosis Date Noted  . Spastic hemiparesis ([place])   . Blood pressure increase diastolic   . Dysphagia due to recent cerebrovascular accident   . Lumbago   . Orthostasis   . 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 Depressive reaction   . Labile blood pressure   . Dysphagia, post-stroke   . Cardioembolic stroke ([place]) 67/61/9509  . Chronic right-sided low back pain with right-sided sciatica   . Tobacco abuse   . Leukocytosis   . Prediabetes   . CVA (cerebral vascular accident) ([place]) 06/02/2019  . Basilar artery occlusion 06/02/2019    Expected Discharge Date: Expected Discharge Date: 07/09/19  Team Members Present: Physician leading conference: Dr. Alysia Penna Care Coodinator Present: Nestor Lewandowsky, RN, BSN, CRRN;Christina Sampson Goon, [place] Nurse Present: Other (comment)(Tomeka Pugh) PT Present: Michaelene Song, PT OT Present: Clyda Greener, OT SLP Present: Jettie Booze, CF-SLP PPS Coordinator present : Gunnar Fusi, SLP     Current Status/Progress Goal Weekly Team Focus  Bowel/Bladder   Patient is able to use urinal but occasional bladder incontinence (adult briefs). Continent of bowel.  Maintain bowel and bladder continence.  Encourage frequent use of urinal  Toilet Q2-3H and assess PRN   Swallow/Nutrition/ Hydration   Dys 3, thin, full supervision  Min  tolerance current diet, carryover swallow strategies, education   ADL's   Max assist for self feeding overall with the RUE, he  can drink from his mug with lid with min assist.  Max assist to total assist for all bed mobility with total assist for supine to sit and squat pivot transfers to the wheelchair.  Total assist for LB bathing and dressing in bed with mod assist for UB bathing and total assist for UB dressing.  downgraded to max to total assist  selfcare retraining, balance retraining, neuromuscular re-education, NMES, pt/family education, DME education   Mobility   max assist rolling R/L, max/total assist supine<>sit, max/total assist of 1 sit<>stand and stand pivot transfers; gait ~87ft overground via 3Musketeer +2 max assist with w/c follow  downgraded to max/total assist overall  B LE tone management, B LE stretching, upright activity tolerance, pt/family education, transfer training, B LE strengthening, sitting balance/trunk control, gait training   Communication   Mod-Max A intelligibility phrase/sentence level, pacing, increased vocal intensity, requires more assist when fatigued  Mod A  carryover intelligibility sentence level, independence wtih use of strategies, correcting verbal errors   Safety/Cognition/ Behavioral Observations  Mod A  Mod A  sustained attention and functional problem solving, education   Pain   Patient c/o pain at site of sacral wound. Tylenol and Oxycodone scheduled and PRN  Patient will maintain pain level less than 4 with increased activity tolerance  Assess pain Qshift and PRN. Pharmacological, non-pharmacologial, repositioning, off-loading interventions.   Skin   Unstageable wound to coccyx, DTI to left heel. Psoriasis scattered throughout, but mainly to extremeties, face and neck  Patient will maintain skin integrity and avoid furthur  breakdown or infection  Assess skin Qshift and PRN, wound care, offloading, specialty mattress    Rehab Goals Patient on target to meet rehab goals: Yes Rehab Goals Revised: Goals downgraded to MAX A and wife and son are aware of discharge goals *See  Care Plan and progress notes for long and short-term goals.     Barriers to Discharge  Current Status/Progress Possible Resolutions Date Resolved   Nursing                  PT                    OT                  SLP                Care Coordinator Insurance for SNF coverage;Other (comments)([name] case for [facility] services and DME; lives in [place]) [place] with steps to entry/wife working on ramp for entry/wife has physical problems and is disabled Son completing ramp to home, moved furniture for hospital bed and [employer], family working on 24/7 caregivers. Purchased BSC, ordered recommended DME and TIS wheelchair on order          Discharge Planning/Teaching Needs:  Home with wife, daughter and son      Team Discussion:  Stop IVF ;check labs to assess ability to hydrate without IVF.Spasticity and tone has improved. Hydro therapy to sacrum discontinued = W-D dressing changes with santyl for discharge. Follow up with PCP and surgeon on wound care. Tol D3 thin diet however still requires mod-max assist for speech intelligibility  Revisions to Treatment Plan:  Team would like to extend LOS however patient is anxious to go home and family feels prepared to provide care needed.    Medical Summary Current Status: Tone improved with botulinum toxin injection of the left elbow flexors, knee flexors and hip adductor's and gastroc, no signs of aspiration pneumonia, fluid intake improving Weekly Focus/Goal: Repeat lab work to see if patient has elevated BUN/creatinine off IV fluids  Barriers to Discharge: Nutrition means;Medical stability   Possible Resolutions to Barriers: Lab work as above, continue stretching/range of motion for spasticity as well as oral medication   Continued Need for Acute Rehabilitation Level of Care: The patient requires daily medical management by a physician with specialized training in physical medicine and rehabilitation for the following  reasons: Direction of a multidisciplinary physical rehabilitation program to maximize functional independence : Yes Medical management of patient stability for increased activity during participation in an intensive rehabilitation regime.: Yes Analysis of laboratory values and/or radiology reports with any subsequent need for medication adjustment and/or medical intervention. : Yes   I attest that I was present, lead the team conference, and concur with the assessment and plan of the team.   Chana Bode B 07/07/2019, 1:25 PM

## 2019-07-07 NOTE — Discharge Summary (Signed)
Physician Discharge Summary  Patient ID: Adrian [name] MRN: [id number] DOB/AGE: [birth year] 54 y.o.  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 EXAM: CT ABDOMEN WITHOUT CONTRAST TECHNIQUE: Multidetector CT imaging of the abdomen was performed following the standard protocol without IV contrast. COMPARISON:  None. FINDINGS: Lower chest: Mild to moderate severity atelectasis and/or infiltrate is seen within the posterior aspect of the right lung base. Hepatobiliary: No focal liver abnormality is seen. Status post cholecystectomy. No biliary dilatation. Pancreas: Unremarkable. No pancreatic ductal dilatation or surrounding inflammatory changes. Spleen: Normal in size without focal abnormality. Adrenals/Urinary Tract: Adrenal glands are unremarkable. Kidneys are normal in size, without renal calculi or hydronephrosis. A 1.7 cm cyst is seen within the mid to lower right kidney. Stomach/Bowel: Stomach is within normal limits. The appendix is not identified. No evidence of bowel wall thickening, distention, or inflammatory changes. Vascular/Lymphatic: There is moderate severity calcification of the abdominal aorta. No enlarged abdominal lymph nodes. Other: No abdominal wall hernia or abnormality. Musculoskeletal: A few small foci of air density are seen within the subcutaneous fat along the anterolateral  aspect of the lower abdominal wall, bilaterally. No acute or significant osseous findings. IMPRESSION: 1. Mild to moderate severity atelectasis and/or infiltrate within the posterior aspect of the right lung base. 2. Evidence of prior cholecystectomy. 3. Small right renal cyst. Aortic Atherosclerosis (ICD10-I70.0). Electronically Signed   By: Aram Candela M.D.   On: 06/16/2019 21:04   DG Chest 2 View  Result Date: 06/11/2019 CLINICAL DATA:  Congestion with coughing. EXAM: CHEST - 2 VIEW COMPARISON:  06/08/2019 FINDINGS: Low lung volumes. Cardiopericardial silhouette is at upper limits of normal for size. There is pulmonary vascular congestion without overt pulmonary edema. Interstitial markings are diffusely coarsened with chronic features. Streaky atelectasis or scarring at the left base is similar to prior. No dense airspace consolidation. No pleural effusion. The visualized bony structures of the thorax are intact. Telemetry leads overlie the chest. IMPRESSION: Low lung volumes with vascular congestion. No overt pulmonary edema or focal airspace consolidation. Streaky opacity at the left base is similar to prior and likely reflects atelectasis or scarring. Electronically Signed   By: Kennith Center M.D.   On: 06/11/2019 10:11   DG Lumbar Spine 2-3 Views  Result Date: 06/13/2019 CLINICAL DATA:  Low back pain EXAM: LUMBAR SPINE - 2-3 VIEW COMPARISON:  None. FINDINGS: Colonic contrast is seen.  Colonic diverticuli. Evaluation of the lumbar spine on the lateral view is obscured by overlapping contrast filled colon. Within this limitation, no traumatic malalignment or fracture seen. Mild multilevel degenerative disc disease is noted. IMPRESSION: 1. Contrast in the colon limits evaluation lumbar spine on the lateral view. No fracture or traumatic malalignment seen within these limitations. Mild degenerative changes. Electronically Signed   By: Gerome Sam III M.D   On: 06/13/2019 14:15   DG Swallowing  Func-Speech Pathology  Result Date: 06/25/2019 Please refer to "Notes" tab for Speech Pathology notes.  Labs:  Basic Metabolic Panel: Recent Labs  Lab 07/02/19 1053 07/03/19 0655 07/04/19 0707 07/08/19 1149  NA 140  --  138 137  K 3.3*  --  4.0 3.7  CL 102  --  104 101  CO2 25  --  24 24  GLUCOSE 108*  --  85 124*  BUN 5*  --  6 7  CREATININE 0.68 0.71 0.71 0.73  CALCIUM 8.9  --  9.0 9.0    CBC: Recent Labs  Lab 07/02/19 1053 07/06/19 0601  WBC 15.2* 12.5*  NEUTROABS 10.9* 6.3  HGB 13.3 12.7*  HCT 40.0 38.3*  MCV 91.7 91.6  PLT 390 381    CBG: No results for input(s): GLUCAP in the last 168 hours.  Family history mother and father with hypertension.  Denies any diabetes mellitus colon cancer esophageal cancer or rectal cancer  Brief HPI:   Adrian [name] is a 54 y.o. right-handed male with history of tobacco abuse as well as chronic back pain.  Lives with wife and son reportedly independent prior to admission.  Presented 06/02/2019 with dysarthria and quadriparesis.  Admission chemistries WBC 21,000 BUN 24 urine drug screen negative.  CT of the head showed multiple bilateral infarcts.  Per report numerous small supratentorial and infratentorial acute infarcts involving the cerebral hemispheres, left thalamus, pons and right cerebellar hemisphere.  Chronic right frontoparietal cortical infarcts.  Follow-up MRI showed slight increase in size of bilateral acute infarcts of the pons.  Unchanged infarcts of the left thalamus left caudate nucleus, right cerebellum and right frontal white matter.  No hemorrhage or mass-effect.  Patient did not receive TPA.  CT angiogram head and neck 06/02/2019 occlusive thrombus within the distal V4 right vertebral artery with occlusive thrombus extending into the proximal and mid basilar artery.  Occlusive thrombus also suspected within the very distal V4 left vertebral artery.  Echocardiogram with ejection fraction of 70% grade 1 diastolic  dysfunction.  EEG negative for seizure.  CTA head and neck repeated 06/09/2019 unchanged multivessel occlusion.  Recommendations were for aspirin 325 mg and Plavix daily.  Patient had initially been maintained on intravenous heparin since discontinued changed to Lovenox.  Dysphagia #1 honey thick liquid diet close monitoring of hydration.  Patient very labile as well as anxious placed on Wellbutrin and the addition of Seroquel as needed.  Bouts of orthostasis initially did require fluid resuscitation.  He was on Levophed which is since been discontinued.  Therapy evaluations completed and patient was admitted for a comprehensive rehab program   Hospital Course: Adrian [name] was admitted to rehab 06/12/2019 for inpatient therapies to consist of PT, ST and OT at least three hours five days a week. Past admission physiatrist, therapy team and rehab RN have worked together to provide customized collaborative inpatient rehab.  Pertaining to patient's multifocal infarcts posterior circulation also bilateral anterior circulation cardioembolic pattern left ICA thrombus versus soft plaque he remained on aspirin Plavix therapy and follow-up neurology services.  Subcutaneous Lovenox for DVT prophylaxis.  Patient with history of chronic back pain currently maintained on Neurontin 600 mg 3 times daily as well as scheduled OxyContin 15 mg every 12 hours and oxycodone for breakthrough pain.  Increased spasticity related to CVA placed on Zanaflex 4 mg 3 times daily as well as did receive Botox injection.  Mood stabilization with the use of Wellbutrin 150 mg twice daily as well as neuropsychology follow-up.  Hospital course noted decubitus ulcer follow-up by general surgery initially maintained on  hydrotherapy with 3 days of Dakin's for Pseudomonas transition back to normal saline wet-to-dry continue Santyl to clean up area which could be discontinued as needed.  No current plan for debridement.  Family teaching ongoing for wound  care.  Noted dysphagia initially on dysphagia #1 honey thick liquid diet concerns for possible need for PEG tube however as speech therapy continued to work with patient diet was advanced and upgraded to a mechanical soft thin liquid diet no need for PEG tube.  Lipitor ongoing for hyperlipidemia.  He did have a history of tobacco abuse maintained on NicoDerm patch as well as patient and family receiving count regards to cessation of nicotine products.  Bouts of orthostasis did improve with the use of support hose.   Blood pressures were monitored on TID basis and soft and monitored   He/ has made gains during rehab stay and is attending therapies  He/ will continue to receive follow up therapies   after discharge  Rehab course: During patient's stay in rehab weekly team conferences were held to monitor patient's progress, set goals and discuss barriers to discharge. At admission, patient required moderate assist sit to stand moderate assist stand pivot transfers +2 physical assist supine to sit and sit to supine.  Moderate assist upper body bathing max is lower body bathing mod assist upper body dressing max is lower body dressing +2 physical assist toilet transfers  Physical exam.  Blood pressure 105/82 pulse 86 temperature 98.4 respirations 18 oxygen saturation 95% room air Neurological.  Makes eye contact with examiner severely dysarthric and labile.  No acute distress HEENT Head.  Normocephalic and atraumatic Eyes.  Pupils round and reactive to light no discharge without nystagmus Neck.  Supple nontender no JVD without thyromegaly Cardiac regular rate rhythm without any extra sounds or murmur heard Respiratory effort normal no respiratory distress without wheeze Abdomen.  Soft nontender positive bowel sounds without rebound Extremities.  No clubbing cyanosis or edema Neurological.  Cranial nerves II through XII intact motor strength 0 out of 5 bilateral deltoids 2 - left and 0/5 right.   Biceps 0/5 bilateral tricep, grip, hip flexors knee extensors ankle dorsi and plantar flexion.  He/  has had improvement in activity tolerance, balance, postural control as well as ability to compensate for deficits. He/ has had improvement in functional use RUE/LUE  and RLE/LLE as well as improvement in awareness.  Patient with slow progressive gains throughout his rehabilitation stay needing some encouragement at times to participate.  He continued to demonstrate increased bilateral lower extremity flexor and abductor tone he continued on Zanaflex as well as recent Botox injection.  He requires max to total assist for supine to sit with patient demonstrating improved ability to sit up on right edge of bed with ability to bring bilateral lower extremities off edge of bed and push with right upper extremity for trunk upright.  Sit to stand with max total assist for 1 and stand pivot transfers wheelchair to edge of bed with max to total assist of 1 for lifting and pivoting hips.  He progressed to over ground gait training 30 feet via 3 muscular tear support.  Max assist needed for dynamic sitting balance with max assist as well as for donning his shirt.  He was able to use right upper extremity to assist with donning it part of the way up his left arm as well as for helping to pull it over his head.  He completed squat pivot transfers to the tilt  in space wheelchair with total assist.  Patient required moderate multimodal cues for functional problem solving and sustained attention during tasks.  He did require encouragement and extra time to initiate.  Max assist verbal cues for implantation of slower rate and increased vocal intensity required.  Full family teaching completed and plan discharge to home       Disposition: Discharged to home    Diet: Mechanical soft thin liquids  Special Instructions: No driving smoking or alcohol  Apply santyl to sacral and left gluteal fold ulcer apply wet to moist  dressing and ABD pad daily  Use bilateral Prevalon boots when patient is in bed  Medications at discharge 1.  Tylenol as needed 2.  Aspirin 3 and 25 mg p.o. daily 3.  Lipitor 80 mg p.o. daily 4.  Wellbutrin 150 mg p.o. twice daily 5.  Plavix 75 mg p.o. daily 6.  Neurontin 600 mg p.o. 3 times daily 7.  NicoDerm patch taper as directed 8.  OxyContin 15 mg every 12 hours 9.  Protonix 40 mg p.o. daily 10.  Inderal 10 mg p.o. twice daily 11.  Oxycodone immediate release 5 mg every 4 hours as needed severe pain 12.  Zanaflex 4 mg p.o. 3 times daily  30-35 minutes were spent completing discharge summary and discharge planning  Discharge Instructions    Ambulatory referral to Neurology   Complete by: As directed    An appointment is requested in approximately 4 weeks bilateral anterior circulation posterior circulation infarction   Ambulatory referral to Physical Medicine Rehab   Complete by: As directed    Moderate complexity follow-up 1 to 2 weeks bilateral anterior posterior corona radiata infarctions      Follow-up Information    Kirsteins, Luanna Salk, MD Follow up.   Specialty: Physical Medicine and Rehabilitation Why: Office to call for appointment Contact information: [place] [address] [phone]        Kinsinger, Arta Bruce, MD Follow up.   Specialty: General Surgery Why: call for home for sacral decubitus care Contact information: [place] [address] [phone]           Signed: Cathlyn Parsons 07/09/2019, 4:44 AM

## 2019-07-07 NOTE — Progress Notes (Signed)
Occupational Therapy Session Note  Patient Details  Name: Adrian [name] MRN: [id number] Date of Birth: 06-24-1965  Today's Date: 07/07/2019 OT Individual Time: 7829-5621 OT Individual Time Calculation (min): 61 min    Short Term Goals: Week 3:  OT Short Term Goal 1 (Week 3): Pt will self feed with mod assist using the RUE with AE for 25% of meal. OT Short Term Goal 2 (Week 3): Pt will complete UB bathing with mod assist in supported sitting position. OT Short Term Goal 3 (Week 3): Pt will roll in the bed to the left side with max assist to help with supine to sit EOB or for donning LB clothing.  Skilled Therapeutic Interventions/Progress Updates:    Pt completed sidelying to sit EOB with max assist.  He was able to move both LEs off of the EOB without assist and increased time, but he needed total assist for translation of his UB up into sitting.  He was able to complete squat pivot transfer to the wheelchair at total assist with immediate transition to reclined position for pain relief.  Took pt down to the therapy gym where he worked on standing at [employer] with one interval of standing with total assist while using the RUE to push out the red lights.  He needed min facilitation with the RUE to push out the lights.  Also hand him work on standing from the wheelchair without use of the UE and total assist for facilitation.  He still demonstrates decreased ability to maintain bilateral knee extension in standing with standing endurance lasting only approximately 1 minute or less.  While resting from standing, therapist completed stretching of the left shoulder internal rotators as well as the biceps.  Had him assist with AAROM of shoulder flexion with elbow extension for 5-6 reps.  Pt completed functional mobility of approximately 10' as well with total assist +2 (pt 25%) before returning to the room.  He finished session with completion of wheelchair to bed transfer with total assist squat pivot  with total assist for sit to supine.  He was able to use BUEs to drink from his mug with HOB elevated to 30 degrees per SLP recommendations.  Discussed with family having him work on self feeding in order to increase RUE use as well as decreasing the amount of assist they have to provide.  Foam grips were issued for utensils last week.    Therapy Documentation Precautions:  Precautions Precautions: Fall Precaution Comments: sacral wound Restrictions Weight Bearing Restrictions: No  Pain: Pain Assessment Pain Scale: Faces Pain Score: 0-No pain Faces Pain Scale: Hurts little more Pain Type: Acute pain Pain Location: Scrotum Pain Descriptors / Indicators: Grimacing Pain Onset: On-going Pain Intervention(s): Repositioned;Emotional support ADL: See Care Tool Section for some details of mobility and selfcare  Therapy/Group: Individual Therapy  Damir Leung OTR/L 07/07/2019, 3:54 PM

## 2019-07-07 NOTE — Progress Notes (Addendum)
[place] PHYSICAL MEDICINE & REHABILITATION PROGRESS NOTE   Subjective/Complaints:  No issues overnite  Discussed wound care as well as ambulation in therapy Pain under better control   ROS: Denies CP, SOB N/V/D   Objective:   No results found. Recent Labs    07/06/19 0601  WBC 12.5*  HGB 12.7*  HCT 38.3*  PLT 381   No results for input(s): NA, K, CL, CO2, GLUCOSE, BUN, CREATININE, CALCIUM in the last 72 hours.  Intake/Output Summary (Last 24 hours) at 07/07/2019 0838 Last data filed at 07/07/2019 0220 Gross per 24 hour  Intake 2241.67 ml  Output 1500 ml  Net 741.67 ml     Physical Exam: Vital Signs Blood pressure 140/70, pulse 62, temperature 98.1 F (36.7 C), resp. rate 16, height '5\' 11"'$  (1.803 m), weight 66.3 kg, SpO2 95 %.  General: No acute distress Mood and affect are appropriate Heart: Regular rate and rhythm no rubs murmurs or extra sounds Lungs: Clear to auscultation, breathing unlabored, no rales or wheezes Abdomen: Positive bowel sounds, soft nontender to palpation, nondistended Skin: base is now clean    Musculoskeletal: Full range of motion in all 4 extremities. No joint swelling Motor: LUE: Shoulder abduction 3 -/5, biceps  Left lower extremity: 2-/5 hip flexion, knee extension RUE: 4/5 proximal distal  Assessment/Plan: 1. 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  Physiatrist is providing close team supervision and 24 hour management of active medical problems listed below.  Physiatrist and rehab team continue to assess barriers to discharge/monitor patient progress toward functional and medical goals  Care Tool:  Bathing        Body parts bathed by helper: Right arm, Left arm, Chest, Abdomen, Front perineal area, Buttocks, Right upper leg, Left upper leg, Right lower leg, Left lower leg, Face     Bathing assist Assist Level: Dependent - Patient 0%     Upper Body Dressing/Undressing Upper body dressing   What is the patient wearing?: Pull over shirt    Upper body assist Assist Level: Maximal Assistance - Patient 25 - 49%(supported sitting)    Lower Body Dressing/Undressing Lower body dressing      What is the patient wearing?: Pants     Lower body assist Assist for lower body dressing: Dependent - Patient 0%     Toileting Toileting    Toileting assist Assist for toileting: 2 Helpers     Transfers Chair/bed transfer  Transfers assist  Chair/bed transfer activity did not occur: Safety/medical concerns  Chair/bed transfer assist level: Total Assistance - Patient < 25%     Locomotion Ambulation   Ambulation assist   Ambulation activity did not occur: Safety/medical concerns  Assist level: 2 helpers Assistive device: Other (comment)(3 Musketeers) Max distance: 5f   Walk 10 feet activity   Assist  Walk 10 feet activity did not occur: Safety/medical concerns  Assist level: 2 helpers Assistive device: Other (comment)(3 Musketeers)   Walk 50 feet activity   Assist Walk 50 feet with 2 turns activity did not occur: Safety/medical concerns         Walk 150 feet activity   Assist Walk 150 feet activity did not occur: Safety/medical concerns         Walk 10 feet on uneven surface  activity   Assist Walk 10 feet on uneven surfaces activity did not occur: Safety/medical concerns  Programmer, multimedia activity did not occur: Safety/medical concerns         Wheelchair 50 feet with 2 turns activity    Assist    Wheelchair 50 feet with 2 turns activity did not occur: Safety/medical concerns       Wheelchair 150 feet activity     Assist  Wheelchair 150 feet activity did not occur: Safety/medical concerns       Blood pressure 140/70, pulse 62, temperature 98.1 F (36.7 C), resp. rate  16, height '5\' 11"'$  (1.803 m), weight 66.3 kg, SpO2 95 %.  Medical Problem List and Plan: 1.Dysarthria/quadriparesis/aphasiasecondary to multifocal infarcts mostly posterior circulation but also bilateral anterior circulation cardioembolic pattern as well as innominate artery occlusion, left ICA thrombus versus soft plaque, right V4 and basal artery occlusive thrombus  Continue CIR Team conference today please see physician documentation under team conference tab, met with team  to discuss problems,progress, and goals. Formulized individual treatment plan based on medical history, underlying problem and comorbidities. 2. Antithrombotics: -DVT/anticoagulation:Lovenox -antiplatelet therapy: Aspirin 325 mg and Plavix 75 mg daily- will not hold plavix unless PEG is scheduled  3. Pain Management:Tramadol as needed  Decubitus pain, off tramadol, ineffective, started oxyIR '5mg'$  Q6 prn, increased to every 4 on 5/16   oxycontin '10mg'$  q 12 dose- d/c when pt is discharged    -encourage re-positioning/OOB as much as possible, supportive seating   4. Mood:Wellbutrin 150 mg twice daily, may need trial of Nuedexta, expect PBA given extensive posterior > anterior circulation infarcts -antipsychotic agents: Seroquel 25 mg 3 times daily as needed 5. Neuropsych: This patientis notcapable of making decisions on hisown behalf.  6. Skin/Wound Care:Routine skin checks- sacrococcygeal eschar,resolved with hydrotherapy,Home with Wet- dry  F/u general surgery ,  Seborrheic dermatitis - improved on valisone  7. Fluids/Electrolytes/Nutrition:Routine in and outs  BMP within acceptable range on 5/16 8. Post stroke dysphagia.   D3 thins, continue to advance as tolerated  Consulted IR for PEG placement but will hold off for now given the upgrade in diet  Continue IVF  Oral intake improving 9. Orthostatic hypotension. Monitor with increased mobility Vitals:   07/06/19 1948 07/07/19  0525  BP: 136/90 140/70  Pulse: 84 62  Resp:    Temp: 98.3 F (36.8 C) 98.1 F (36.7 C)  SpO2: 95% 95%   Increased propanolol to '10mg'$  BID on 5/16, monitor to avoid hypoperfusion 10. Hyperlipidemia. Lipitor 11. Tobacco abuse. NicoDerm patch.  Counseled when possible 12. Leukocytosis. -Per CT abd  RLL atelectasis vs PNA,   afebrile   WBCs 15.2 on 5/14, labs ordered for tomorrow 13. Chronic spasticity primarily affecting the left elbow flexors, knee flexors, ankle plantar flexor and hip adductor's.    Xeomin injected on 5/14- Left Biceps, adductor longus, hamstrings   Cont ROM LOS: 25 days A FACE TO [place] E Teven Mittman 07/07/2019, 8:38 AM

## 2019-07-07 NOTE — Progress Notes (Signed)
  Team Conference Report to Patient/Family  Team Conference discussion was reviewed with the patient, wife and son, including goals, any changes in plan of care and target discharge date.  Patient and caregiver express understanding and are in agreement.  The patient has a target discharge date of 07/09/19. HH services have been set up with [facility]. TIS and specialty cushion have been ordered with [facility]. Reviewed [place] application process and resources available for handicapped accessible [facility] customization. Patient does not want to be transported by ambulance; wants to ride in his truck. Reviewed ability to sit upright for period of time in truck and transfers to and from wheelchair to get to the truck, etc with patient and family. Wife is agreeable to transport via ambulance. Reviewed rationale for protein supplements and other sources for protein/wound healing. Hospital bed, hoyer and lift pad have been delivered to the home and family is ready for discharge on Friday 07/09/19. Wife understands need to practice dressing changes to sacrum prior to discharge to assure she is comfortable with the procedure. Chana Bode B 07/07/2019, 1:32 PM

## 2019-07-07 NOTE — Progress Notes (Signed)
Physical Therapy Session Note  Patient Details  Name: Adrian [name] MRN: [id number] Date of Birth: May 21, 1965  Today's Date: 07/07/2019 PT Individual Time: 1100-1155 PT Individual Time Calculation (min): 55 min    Short Term Goals: Week 4:  PT Short Term Goal 1 (Week 4): = to LTGs based on ELOS  Skilled Therapeutic Interventions/Progress Updates:    Pt supine in bed upon PT arrival, agreeable to therapy tx and reports pain in buttocks region from sacral wound, does not rate, repositioning throughout session for pain management. Pt's family - son and wife- present for family education this session. Pt reports he does not want to practice the hoyer transfer again, "I want to walk." Wife feels comfortable with hoyer since she practiced the other day and son feels confident that he can figure it out/knows how to from watching. Son also to participate in sit<>stands transfers and stand pivot transfers hands on. Pt transferred to sitting with max assist and performed stand pivot transfer to the w/c with max assist from therapist and son, therapist providing cues for education/techniques. Pt transported to the gym. Pt ambulated 2 x 25 ft this session with max assist from therapist +2 for w/c follow, pt able to maintain standing balance with R UE on rail and L UE around therapist shoulder, cues for knee extension during stance with good quad activation, initiates swing phase with hip flexion but unable to clear feet requiring assist for placement/advancement. Pt transported back to room, therapist providing education on how to perform sit<>stands from TIS w/c at home while parked beside bedrail using the bedrail for UE support to simulate rail in hall, son provided assist and performed x 2 sit<>Stands with the patient max assist. Son performed stand pivot transfer w/c>bed with max assist, educated them on transfer towards the R and pt using R UE to reach for bedrail or arm rest, stand first and then step to  turn. Sit>supine with assist from son max assist. Therapist providing continued education on importance of out of bed activity when at home. Pt left supine with needs in reach and in care of family.   Therapy Documentation Precautions:  Precautions Precautions: Fall Precaution Comments: sacral wound Restrictions Weight Bearing Restrictions: No     Therapy/Group: Individual Therapy  Cresenciano Genre, PT, DPT, CSRS 07/07/2019, 12:00 PM

## 2019-07-07 NOTE — Plan of Care (Signed)
  Problem: Consults Goal: RH STROKE PATIENT EDUCATION Description: See Patient Education module for education specifics  Outcome: Progressing   Problem: RH BOWEL ELIMINATION Goal: RH STG MANAGE BOWEL WITH ASSISTANCE Description: STG Manage Bowel with mod Assistance. Outcome: Progressing Goal: RH STG MANAGE BOWEL W/MEDICATION W/ASSISTANCE Description: STG Manage Bowel with Medication with mod Assistance. Outcome: Progressing   Problem: RH BLADDER ELIMINATION Goal: RH STG MANAGE BLADDER WITH ASSISTANCE Description: STG Manage Bladder With mod Assistance Outcome: Progressing   Problem: RH SKIN INTEGRITY Goal: RH STG SKIN FREE OF INFECTION/BREAKDOWN Outcome: Progressing Goal: RH STG MAINTAIN SKIN INTEGRITY WITH ASSISTANCE Description: STG Maintain Skin Integrity With mod Assistance. Outcome: Progressing Goal: RH STG ABLE TO PERFORM INCISION/WOUND CARE W/ASSISTANCE Description: STG Able To Perform Incision/Wound Care With max Assistance. Outcome: Progressing   Problem: RH SAFETY Goal: RH STG ADHERE TO SAFETY PRECAUTIONS W/ASSISTANCE/DEVICE Description: STG Adhere to Safety Precautions With min Assistance/Device. Outcome: Progressing   Problem: RH COGNITION-NURSING Goal: RH STG USES MEMORY AIDS/STRATEGIES W/ASSIST TO PROBLEM SOLVE Description: STG Uses Memory Aids/Strategies With min Assistance to Problem Solve. Outcome: Progressing Goal: RH STG ANTICIPATES NEEDS/CALLS FOR ASSIST W/ASSIST/CUES Description: STG Anticipates Needs/Calls for Assist With min Assistance/Cues. Outcome: Progressing   Problem: RH PAIN MANAGEMENT Goal: RH STG PAIN MANAGED AT OR BELOW PT'S PAIN GOAL Description: Less than 3 out of 10 Outcome: Progressing   Problem: RH KNOWLEDGE DEFICIT Goal: RH STG INCREASE KNOWLEDGE OF HYPERTENSION Description: Patient/family will be able to identify medication to treat HTN along with 2 other nonpharmalogical interventions Outcome: Progressing Goal: RH STG INCREASE  KNOWLEDGE OF DYSPHAGIA/FLUID INTAKE Description: Patient will be able to demonstrate understanding of thickened fluid necessity Outcome: Progressing Goal: RH STG INCREASE KNOWLEGDE OF HYPERLIPIDEMIA Outcome: Progressing Goal: RH STG INCREASE KNOWLEDGE OF STROKE PROPHYLAXIS Description: Patient/Family will be able to verbalize to 3 medications for stroke prevention post discharge  Outcome: Progressing   

## 2019-07-07 NOTE — Progress Notes (Signed)
Speech Language Pathology Daily Session Note  Patient Details  Name: Adrian [name] MRN: [id number] Date of Birth: [birth year]  Today's Date: 07/07/2019 SLP Individual Time: 4270-6237 SLP Individual Time Calculation (min): 44 min  Short Term Goals: Week 3: SLP Short Term Goal 1 (Week 3): Pt will participate in MBSS to assess potential for liquid and solid advancement. SLP Short Term Goal 2 (Week 3): Pt will consume trials of upgraded solids with functional mastication and full oral clearance with Mod A cues for use of swallow strategies. SLP Short Term Goal 3 (Week 3): Pt will verbalize at the word level to achieve 50% intelligibility with Mod A multimodal cueing for intelligibility strategies. SLP Short Term Goal 4 (Week 3): Pt will sustain attention to functional tasks for 5-7 minutes with Mod A multimodal cues. SLP Short Term Goal 5 (Week 3): Pt will demonstrate ability to problem solve basic and functional situations with Mod A multimodal cues.  Skilled Therapeutic Interventions: Pt was seen for skilled ST targeting dysphagia and cognitive-linguistic goals. Pt required Max A verbal cues for redirection to tasks in 5 minute intervals today, as well as for use of speech strategies to communicate wants and needs at the phrase level. Max A was required for safe functional problem solving during bed mobility. SLP provided assistance with self-feeding regular texture breakfast solids with thin liquids via straw. 1 immediate cough noted after bite of egg, as well as trace posterior lingual residue, which was cleared with 1 verbal cue for use of dry swallow. Recommend continue current diet. Pt left laying semi-reclined in bed with alarm est and needs within reach. Continue per current plan of care.        Pain Pain Assessment Pain Score: Asleep  Therapy/Group: Individual Therapy  [name] 07/07/2019, 6:48 AM

## 2019-07-08 ENCOUNTER — Inpatient Hospital Stay (HOSPITAL_COMMUNITY): Payer: Self-pay | Admitting: Physical Therapy

## 2019-07-08 ENCOUNTER — Inpatient Hospital Stay (HOSPITAL_COMMUNITY): Payer: Self-pay | Admitting: Speech Pathology

## 2019-07-08 ENCOUNTER — Inpatient Hospital Stay (HOSPITAL_COMMUNITY): Payer: Self-pay | Admitting: Occupational Therapy

## 2019-07-08 LAB — BASIC METABOLIC PANEL
Anion gap: 12 (ref 5–15)
BUN: 7 mg/dL (ref 6–20)
CO2: 24 mmol/L (ref 22–32)
Calcium: 9 mg/dL (ref 8.9–10.3)
Chloride: 101 mmol/L (ref 98–111)
Creatinine, Ser: 0.73 mg/dL (ref 0.61–1.24)
GFR calc Af Amer: >60 mL/min (ref >60)
GFR calc non Af Amer: >60 mL/min (ref >60)
Glucose, Bld: 124 mg/dL — ABNORMAL HIGH (ref 70–99)
Potassium: 3.7 mmol/L (ref 3.5–5.1)
Sodium: 137 mmol/L (ref 135–145)

## 2019-07-08 LAB — PATHOLOGIST SMEAR REVIEW

## 2019-07-08 MED ORDER — OXYCODONE HCL ER 15 MG PO T12A: 15.0000 mg | EXTENDED_RELEASE_TABLET | Freq: Two times a day (BID) | ORAL | 0 refills | Status: DC

## 2019-07-08 MED ORDER — NICOTINE 14 MG/24HR TD PT24: MEDICATED_PATCH | TRANSDERMAL | 0 refills | Status: AC

## 2019-07-08 MED ORDER — TIZANIDINE HCL 2 MG PO TABS: 2.0000 mg | ORAL_TABLET | Freq: Three times a day (TID) | ORAL | 0 refills | Status: DC

## 2019-07-08 MED ORDER — PANTOPRAZOLE SODIUM 40 MG PO PACK: 40.0000 mg | PACK | Freq: Every day | ORAL | 0 refills | Status: AC

## 2019-07-08 MED ORDER — GABAPENTIN 300 MG PO CAPS: 600.0000 mg | ORAL_CAPSULE | Freq: Three times a day (TID) | ORAL | 0 refills | Status: AC

## 2019-07-08 MED ORDER — CLOPIDOGREL BISULFATE 75 MG PO TABS: 75.0000 mg | ORAL_TABLET | Freq: Every day | ORAL | 0 refills | Status: AC

## 2019-07-08 MED ORDER — PROPRANOLOL HCL 10 MG PO TABS: 10.0000 mg | ORAL_TABLET | Freq: Two times a day (BID) | ORAL | 0 refills | Status: AC

## 2019-07-08 MED ORDER — ATORVASTATIN CALCIUM 80 MG PO TABS: 80.0000 mg | ORAL_TABLET | Freq: Every day | ORAL | 0 refills | Status: AC

## 2019-07-08 MED ORDER — TIZANIDINE HCL 2 MG PO TABS
2.0000 mg | ORAL_TABLET | Freq: Three times a day (TID) | ORAL | Status: DC
  Administered 2019-07-08 – 2019-07-09 (×3): 2 mg via ORAL
  Filled 2019-07-08 (×3): qty 1

## 2019-07-08 MED ORDER — OXYCODONE HCL 5 MG PO TABS: 5.0000 mg | ORAL_TABLET | ORAL | 0 refills | Status: DC | PRN

## 2019-07-08 MED ORDER — BUPROPION HCL 75 MG PO TABS: 150.0000 mg | ORAL_TABLET | Freq: Two times a day (BID) | ORAL | 0 refills | Status: AC

## 2019-07-08 MED ORDER — ASPIRIN 325 MG PO TABS: 325.0000 mg | ORAL_TABLET | Freq: Every day | ORAL | Status: DC

## 2019-07-08 MED ORDER — ACETAMINOPHEN 325 MG PO TABS: 325.0000 mg | ORAL_TABLET | ORAL | Status: AC | PRN

## 2019-07-08 MED FILL — GABAPENTIN 300 MG CAPSULE: 300 | 30 days supply | Qty: 180 | Fill #0

## 2019-07-08 MED FILL — buPROPion HCL 75 MG TABS: 75 | 30 days supply | Qty: 120 | Fill #0

## 2019-07-08 MED FILL — oxyCODONE HCL 5 MG TABS: 5 | 5 days supply | Qty: 30 | Fill #0

## 2019-07-08 MED FILL — PANTOPRAZOLE SOD DR 40 MG T: 40 | 30 days supply | Qty: 30 | Fill #0

## 2019-07-08 MED FILL — ATORVASTATIN CALCIUM 80 MG: 80 | 30 days supply | Qty: 30 | Fill #0

## 2019-07-08 MED FILL — CLOPIDOGREL 75 MG TABLET: 75 | 30 days supply | Qty: 30 | Fill #0

## 2019-07-08 MED FILL — tiZANidine HCL 2 MG TABS: 2 | 30 days supply | Qty: 90 | Fill #0

## 2019-07-08 MED FILL — PROPRANOLOL 10 MG TABLET: 10 | 30 days supply | Qty: 60 | Fill #0

## 2019-07-08 NOTE — Progress Notes (Signed)
Occupational Therapy Discharge Summary  Patient Details  Name: ASHE GAGO MRN: [id number] Date of Birth: 1965/11/04  Today's Date: 07/08/2019 OT Individual Time: 9935-7017 OT Individual Time Calculation (min): 60 min   Session Note:  Pt in bed to start session.  Provided handouts to family with visual references for BUE AAROM and PROM exercises.  Also provided handout for therapy putty exercises for grip and pinch to be completed with the RUE only at this time secondary to increased tone in the left digit flexors.  Red medium resistance putty was also provided to take home.  Therapist then assisted pt with donning of his shoes with total assist and transition to sitting on the left side of the bed with max assist.  He completed squat pivot transfer to the wheelchair with total assist.  He was then taken down to the therapy gym where he worked on functional mobility with use of the rail on the right side as well as three muskateers technique.  He ambulated approximately 10' on the rail with total assist and 30' with the three muskateers technique.  Pt still with increased tone in his trunk and LEs, with the LLE being more impaired than the right.  He demonstrated decreased ability to maintain hip or trunk extension during mobility and needed overall max assist to maintain knee extension on the left in stance phase.  Max assist for advancing the LLE as well as mod assist for the right secondary to having his shoes on this session.  Noted knee buckling also occurred on the right in stance phase when attempting to advance the LLE.  Finished session with transfer back to the room and pt completing squat pivot transfer to the wheelchair with total assist.  Also educated pt's son on how to assist with scooting back in the wheelchair without increasing pain in his sacrum.  Pt left with family present and call button and phone in reach.  All questions answered as well.      Patient has met 9 of 10 long term goals  due to improved activity tolerance, improved balance, postural control, ability to compensate for deficits, functional use of  RIGHT upper and RIGHT lower extremity, improved attention, improved awareness and improved coordination.  Patient to discharge at [facility] Max Assist level.  Patient's care partner is independent to provide the necessary physical and cognitive assistance at discharge.    Reasons goals not met: Pt needs total assist for toilet transfers  Recommendation:  Patient will benefit from ongoing skilled OT services in home health setting to continue to advance functional skills in the area of BADL and Reduce care partner burden.  Mr . Ator continues to demonstrate significant limitations in ADL tasks with an overall level of max to total assist needed to complete bathing and dressing tasks at bed level.  He has been limited with sitting tolerance for work on balance, and BUE use secondary to his sacral wound and decreased pain control.  Most of his sessions have involved work on standing, functional mobility, or LUE activation from sidelying position.  Mr. Grabill continues to demonstrate improvements with AROM in all extremities, but is also limited by increased tone in his LEs as well as trunk when attempting standing and mobility.  He currently needs total assist +2 for any functional mobility as well as total assist for transfers without use of the hoyer lift.  Botox injections were administered last week in the LUE and the LLE to help reduce this and  there has been slight improvement noted in his tone.  Recommend continued HHOT to further progress ADL function, mobility, and strengthening in hopes to progress to a min assist level or better and for reduction of caregiver burden.    Equipment:  hoyer lift, wheelchair, hospital bed  Reasons for discharge: treatment goals met and discharge from hospital  Patient/family agrees with progress made and goals achieved: Yes  OT  Discharge Precautions/Restrictions  Precautions Precautions: Fall;Other (comment) Precaution Comments: sacral wound Restrictions Weight Bearing Restrictions: No  Pain Pain Assessment Pain Scale: Faces Faces Pain Scale: Hurts a little bit Pain Type: Acute pain Pain Location: Buttocks Pain Orientation: Mid Pain Descriptors / Indicators: Discomfort Pain Onset: With Activity Pain Intervention(s): Repositioned ADL ADL Eating: Minimal assistance Where Assessed-Eating: Bed level Grooming: Maximal assistance Where Assessed-Grooming: Bed level, Wheelchair Upper Body Bathing: Moderate assistance Where Assessed-Upper Body Bathing: Bed level Lower Body Bathing: Dependent Where Assessed-Lower Body Bathing: Bed level Upper Body Dressing: Moderate assistance Where Assessed-Upper Body Dressing: Edge of bed Lower Body Dressing: Dependent Where Assessed-Lower Body Dressing: Bed level Toileting: Dependent Where Assessed-Toileting: Bed level Toilet Transfer: Dependent Toilet Transfer Method: Squat pivot Toilet Transfer Equipment: Bedside commode Vision Baseline Vision/History: Wears glasses Wears Glasses: Reading only Patient Visual Report: Blurring of vision Vision Assessment?: Yes Eye Alignment: Within Functional Limits Ocular Range of Motion: Within Functional Limits Alignment/Gaze Preference: Within Defined Limits Tracking/Visual Pursuits: Able to track stimulus in all quads without difficulty Saccades: Within functional limits Convergence: Within functional limits Visual Fields: No apparent deficits Perception  Perception: Within Functional Limits Praxis Praxis: Impaired Praxis Impairment Details: Motor planning Cognition Overall Cognitive Status: Impaired/Different from baseline Arousal/Alertness: Awake/alert Orientation Level: Oriented X4 Attention: Focused;Sustained Focused Attention: Appears intact Sustained Attention: Impaired Sustained Attention Impairment:  Functional basic(attention affected by increased pain) Memory: Appears intact Awareness: Impaired Awareness Impairment: Emergent impairment Problem Solving: Impaired Problem Solving Impairment: Verbal basic;Functional basic Safety/Judgment: Impaired Sensation Sensation Light Touch: Impaired Detail Light Touch Impaired Details: Impaired RUE;Impaired LUE(Pt able to detect light touch bilaterally but reports feeling numb in his UEs) Hot/Cold: Not tested Proprioception: Impaired by gross assessment Stereognosis: Not tested Coordination Gross Motor Movements are Fluid and Coordinated: No Fine Motor Movements are Fluid and Coordinated: No Coordination and Movement Description: He can use the LUE at a stabilizer with supervision to hold small items to be opened as well as using it as an active assist when donning his shirt with overall max hand over hand.  The RUE is functioning at diminhed level currently but he is able to hold his cup and drink from it as well as hold a utensil with built up foam gripm  Still with signifcant limitations in strength and coordination especially in the LUE compared to the right. Heel Shin Test: unable to perform due to paresis and hypertonia Motor  Motor Motor: Abnormal tone;Hemiplegia;Abnormal postural alignment and control Motor - Discharge Observations: quadriparesis, hypertonia in B LEs and L UE most significantly, impaired motor planning Mobility  Bed Mobility Bed Mobility: Left Sidelying to Sit Rolling Right: Minimal Assistance - Patient > 75%(with use of the rails) Rolling Left: Minimal Assistance - Patient > 75%(with use of the rail) Right Sidelying to Sit: Maximal Assistance - Patient 25-49% Left Sidelying to Sit: Maximal Assistance - Patient 25-49% Supine to Sit: Maximal Assistance - Patient - Patient 25-49% Sit to Supine: Maximal Assistance - Patient 25-49% Sit to Sidelying Right: Maximal Assistance - Patient 25-49% Transfers Sit to Stand: Total  Assistance - Patient < 25% Stand  to Sit: Total Assistance - Patient < 25%  Trunk/Postural Assessment  Cervical Assessment Cervical Assessment: Exceptions to WFL(cervical flexion at rest with protraction) Thoracic Assessment Thoracic Assessment: Exceptions to WFL(thoracic rounding) Lumbar Assessment Lumbar Assessment: Exceptions to WFL(posterior pelvic tilt at rest) Postural Control Postural Control: Deficits on evaluation  Balance Balance Balance Assessed: Yes Static Sitting Balance Static Sitting - Balance Support: Feet supported;Left upper extremity supported Static Sitting - Level of Assistance: 2: Max assist Dynamic Sitting Balance Dynamic Sitting - Balance Support: Feet supported;Left upper extremity supported Dynamic Sitting - Level of Assistance: 2: Max assist Static Standing Balance Static Standing - Balance Support: During functional activity;Right upper extremity supported Static Standing - Level of Assistance: 1: +1 Total assist Dynamic Standing Balance Dynamic Standing - Balance Support: During functional activity;Right upper extremity supported Dynamic Standing - Level of Assistance: 1: +2 Total assist Extremity/Trunk Assessment RUE Assessment RUE Assessment: Exceptions to [facility] Active Range of Motion (AROM) Comments: shoulder flexion approximately 0-140 degrees, gross grasp WFLS with extension at 90%, elbow flexion/extension AROM WFLS General Strength Comments: Pt with shoulder, elbow, and grip strength at 3/5.  He is able to oppose his thumb to all digits as well.  Decreased FM coordination is present for manipulation of objects or for holding onto his utensils. LUE Assessment LUE Assessment: Exceptions to [facility] Passive Range of Motion (PROM) Comments: Shoulder flexion 0-140 degrees, elbow flexion WFLs, extension limited secondary to increased tone, see below for specfics on tone,  Digit flexion WFLS with slight tightness in the digit extensors General Strength Comments:  Pt demonstrates 90% grasp with 60% of gross digit extension.  Shoulder flexion is currently at 2-/5 as well as elbow extension,  elbow flexion is 3/5 LUE Body System: Neuro LUE Tone LUE Tone: Moderate;Modified Ashworth Body Part - Modified Ashworth Scale: Elbow;Fingers Elbow - Modified Ashworth Scale for Grading Hypertonia LUE: Considerable increase in muschle tone, passive movement difficult Fingers - Modified Ashworth Scale for Grading Hypertonia LUE: Considerable increase in muschle tone, passive movement difficult   MCGUIRE,JAMES OTR/L 07/08/2019, 4:47 PM

## 2019-07-08 NOTE — Discharge Instructions (Signed)
Inpatient Rehab Discharge Instructions  Adrian [name] Discharge date and time: No discharge date for patient encounter.   Activities/Precautions/ Functional Status: Activity: activity as tolerated Diet:  Wound Care: none needed Functional status:  ___ No restrictions     ___ Walk up steps independently ___ 24/7 supervision/assistance   ___ Walk up steps with assistance ___ Intermittent supervision/assistance  ___ Bathe/dress independently ___ Walk with walker     _x__ Bathe/dress with assistance ___ Walk Independently    ___ Shower independently ___ Walk with assistance    ___ Shower with assistance ___ No alcohol     ___ Return to work/school ________  COMMUNITY REFERRALS UPON DISCHARGE:  Home Health: PT, OT,  ST,  RN, SNA  Agency:[facility] Phone:[phone]  Medical Equipment/Items Ordered:Hospital bed, hoyer lift and hoyer pad  Agency/Supplier:[facility]  Medical Equipment/Items Ordered:Tilt in Space wheelchair and specialty cushion  Agency/Supplier:[facility], [employer] [phone]  Special Instructions:  No driving smoking or alcohol  Santyl to sacral and left gluteal fold apply wet to moisten ABD pad daily   STROKE/TIA DISCHARGE INSTRUCTIONS SMOKING Cigarette smoking nearly doubles your risk of having a stroke & is the single most alterable risk factor  If you smoke or have smoked in the last 12 months, you are advised to quit smoking for your health.  Most of the excess cardiovascular risk related to smoking disappears within a year of stopping.  Ask you doctor about anti-smoking medications  [facility] Quit Line: 1-800-QUIT NOW  Free Smoking Cessation Classes [phone]  CHOLESTEROL Know your levels; limit fat & cholesterol in your diet  Lipid Panel     Component Value Date/Time   CHOL 221 (H) 06/02/2019 1830   TRIG 414 (H) 06/02/2019 1830   HDL 40 (L) 06/02/2019 1830   CHOLHDL 5.5 06/02/2019 1830   VLDL UNABLE TO CALCULATE IF TRIGLYCERIDE OVER  400 mg/dL 63/14/9702 6378   LDLCALC UNABLE TO CALCULATE IF TRIGLYCERIDE OVER 400 mg/dL 58/85/0277 4128      Many patients benefit from treatment even if their cholesterol is at goal.  Goal: Total Cholesterol (CHOL) less than 160  Goal:  Triglycerides (TRIG) less than 150  Goal:  HDL greater than 40  Goal:  LDL (LDLCALC) less than 100   BLOOD PRESSURE American Stroke Association blood pressure target is less that 120/80 mm/Hg  Your discharge blood pressure is:  BP: (!) 144/80  Monitor your blood pressure  Limit your salt and alcohol intake  Many individuals will require more than one medication for high blood pressure  DIABETES (A1c is a blood sugar average for last 3 months) Goal HGBA1c is under 7% (HBGA1c is blood sugar average for last 3 months)  Diabetes: No known diagnosis of diabetes    Lab Results  Component Value Date   HGBA1C 5.8 (H) 06/02/2019     Your HGBA1c can be lowered with medications, healthy diet, and exercise.  Check your blood sugar as directed by your physician  Call your physician if you experience unexplained or low blood sugars.  PHYSICAL ACTIVITY/REHABILITATION Goal is 30 minutes at least 4 days per week  Activity: Increase activity slowly, Therapies: Physical Therapy: Home Health Return to work:   Activity decreases your risk of heart attack and stroke and makes your heart stronger.  It helps control your weight and blood pressure; helps you relax and can improve your mood.  Participate in a regular exercise program.  Talk with your doctor about the best form of exercise for  you (dancing, walking, swimming, cycling).  DIET/WEIGHT Goal is to maintain a healthy weight  Your discharge diet is:  Diet Order            DIET - DYS 1 Room service appropriate? No; Fluid consistency: Honey Thick; Fluid restriction: Other (see comments)  Diet effective now              liquids Your height is:  Height: 5\' 11"  (180.3 cm) Your current weight is:  Weight: 66.3 kg Your Body Mass Index (BMI) is:  BMI (Calculated): 20.4  Following the type of diet specifically designed for you will help prevent another stroke.  Your goal weight range is:    Your goal Body Mass Index (BMI) is 19-24.  Healthy food habits can help reduce 3 risk factors for stroke:  High cholesterol, hypertension, and excess weight.  RESOURCES Stroke/Support Group:  Call [phone]   STROKE EDUCATION PROVIDED/REVIEWED AND GIVEN TO PATIENT Stroke warning signs and symptoms How to activate emergency medical system (call 911). Medications prescribed at discharge. Need for follow-up after discharge. Personal risk factors for stroke. Pneumonia vaccine given:  Flu vaccine given:  My questions have been answered, the writing is legible, and I understand these instructions.  I will adhere to these goals & educational materials that have been provided to me after my discharge from the hospital.     My questions have been answered and I understand these instructions. I will adhere to these goals and the provided educational materials after my discharge from the hospital.  Patient/Caregiver Signature _______________________________ Date __________  Clinician Signature _______________________________________ Date __________  Please bring this form and your medication list with you to all your follow-up doctor's appointments.

## 2019-07-08 NOTE — Progress Notes (Signed)
Adrian [name] PHYSICAL MEDICINE & REHABILITATION PROGRESS NOTE   Subjective/Complaints:  No issues overnite , pt states he took both nexium and protonix at home as prescribed by PCP Also very tired during the day,  Discussed that OxyCR will be d/ced tomorrow   ROS: Denies CP, SOB N/V/D   Objective:   No results found. Recent Labs    07/06/19 0601  WBC 12.5*  HGB 12.7*  HCT 38.3*  PLT 381   No results for input(s): NA, K, CL, CO2, GLUCOSE, BUN, CREATININE, CALCIUM in the last 72 hours.  Intake/Output Summary (Last 24 hours) at 07/08/2019 0812 Last data filed at 07/08/2019 0530 Gross per 24 hour  Intake 498 ml  Output 1150 ml  Net -652 ml     Physical Exam: Vital Signs Blood pressure (!) 146/88, pulse 82, temperature 98.4 F (36.9 C), temperature source Oral, resp. rate 16, height 5\' 11"  (1.803 m), weight 66.3 kg, SpO2 98 %.  General: No acute distress Mood and affect are appropriate Heart: Regular rate and rhythm no rubs murmurs or extra sounds Lungs: Clear to auscultation, breathing unlabored, no rales or wheezes Abdomen: Positive bowel sounds, soft nontender to palpation, nondistended Skin: base is now clean    Musculoskeletal: Full range of motion in all 4 extremities. No joint swelling Motor: LUE: Shoulder abduction 3 -/5, biceps  Left lower extremity: 2-/5 hip flexion, knee extension RUE: 4/5 proximal distal  Assessment/Plan: 1. 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  Physiatrist is providing close team supervision and 24 hour management of active medical problems listed below.  Physiatrist and rehab team continue to assess barriers to discharge/monitor patient progress toward functional and medical goals  Care Tool:  Bathing        Body parts bathed by helper: Right arm, Left arm, Chest, Abdomen, Front perineal  area, Buttocks, Right upper leg, Left upper leg, Right lower leg, Left lower leg, Face     Bathing assist Assist Level: Dependent - Patient 0%     Upper Body Dressing/Undressing Upper body dressing   What is the patient wearing?: Pull over shirt    Upper body assist Assist Level: Maximal Assistance - Patient 25 - 49%(supported sitting)    Lower Body Dressing/Undressing Lower body dressing      What is the patient wearing?: Pants     Lower body assist Assist for lower body dressing: Dependent - Patient 0%     Toileting Toileting    Toileting assist Assist for toileting: 2 Helpers     Transfers Chair/bed transfer  Transfers assist  Chair/bed transfer activity did not occur: Safety/medical concerns  Chair/bed transfer assist level: Total Assistance - Patient < 25%     Locomotion Ambulation   Ambulation assist   Ambulation activity did not occur: Safety/medical concerns  Assist level: 2 helpers Assistive device: Other (comment)(rail in hallway) Max distance: 25 ft   Walk 10 feet activity   Assist  Walk 10 feet activity did not occur: Safety/medical concerns  Assist level: 2 helpers Assistive device: Other (comment)(rail in hallway)   Walk 50 feet activity   Assist Walk 50 feet with 2 turns activity did not occur: Safety/medical concerns         Walk 150 feet activity   Assist Walk 150 feet activity did not occur: Safety/medical concerns         Walk 10 feet on  uneven surface  activity   Assist Walk 10 feet on uneven surfaces activity did not occur: Safety/medical Armed forces technical officer activity did not occur: Safety/medical concerns         Wheelchair 50 feet with 2 turns activity    Assist    Wheelchair 50 feet with 2 turns activity did not occur: Safety/medical concerns       Wheelchair 150 feet activity     Assist  Wheelchair 150 feet activity did not occur:  Safety/medical concerns       Blood pressure (!) 146/88, pulse 82, temperature 98.4 F (36.9 C), temperature source Oral, resp. rate 16, height 5\' 11"  (1.803 m), weight 66.3 kg, SpO2 98 %.  Medical Problem List and Plan: 1.Dysarthria/quadriparesis/aphasiasecondary to multifocal infarcts mostly posterior circulation but also bilateral anterior circulation cardioembolic pattern as well as innominate artery occlusion, left ICA thrombus versus soft plaque, right V4 and basal artery occlusive thrombus  Continue CIR Plan d/c home with [facility] tomorrow  2. Antithrombotics: -DVT/anticoagulation:Lovenox -antiplatelet therapy: Aspirin 325 mg and Plavix 75 mg daily- will not hold plavix unless PEG is scheduled  3. Pain Management:Tramadol as needed  Decubitus pain, off tramadol, ineffective, started oxyIR 5mg  Q6 prn, increased to every 4 on 5/16   oxycontin 15mg  q 12 dose- d/c when pt is discharged  Will Rx 14 day supply of oxyIR    -encourage re-positioning/OOB as much as possible, supportive seating   4. Mood:Wellbutrin 150 mg twice daily, may need trial of Nuedexta, expect PBA given extensive posterior > anterior circulation infarcts -antipsychotic agents: Seroquel 25 mg 3 times daily as needed 5. Neuropsych: This patientis notcapable of making decisions on hisown behalf.  6. Skin/Wound Care:Routine skin checks- sacrococcygeal eschar,resolved with hydrotherapy,Home with Wet- dry  F/u general surgery ,  Seborrheic dermatitis - improved on valisone  7. Fluids/Electrolytes/Nutrition:Routine in and outs  BMP within acceptable range on 5/16 8. Post stroke dysphagia.   D3 thins, continue to advance as tolerated  Consulted IR for PEG placement but will hold off for now given the upgrade in diet  Continue IVF  Oral intake improving 9. Orthostatic hypotension. Monitor with increased mobility Vitals:   07/07/19 1922 07/08/19 0525  BP: 140/85 (!) 146/88  Pulse: 92  82  Resp: 16 16  Temp: 98.4 F (36.9 C) 98.4 F (36.9 C)  SpO2: 99% 98%   Increased propanolol to 10mg  BID on 5/16,controlled  10. Hyperlipidemia. Lipitor 11. Tobacco abuse. NicoDerm patch.  Counseled when possible 12. Leukocytosis. -Per CT abd  RLL atelectasis vs PNA,   afebrile   WBCs 15.2 on 5/14, down to 12.5 on 5/18 13. Chronic spasticity primarily affecting the left elbow flexors, knee flexors, ankle plantar flexor and hip adductor's.    Xeomin injected on 5/14- Left Biceps, adductor longus, hamstrings ,   Cont ROM LOS: 26 days A FACE TO FACE EVALUATION WAS PERFORMED  Charlett Blake 07/08/2019, 8:12 AM

## 2019-07-08 NOTE — Progress Notes (Signed)
Speech Language Pathology Discharge Summary  Patient Details  Name: Adrian [name] MRN: [id number] Date of Birth: 1965-03-17  Today's Date: 07/08/2019 SLP Individual Time: 0730-0811 SLP Individual Time Calculation (min): 41 min   Skilled Therapeutic Interventions:  Pt was seen for skilled ST targeting aforementioned goals and completing family education. Pt's wife present, and has been receptive to education throughout his stay (present nearly everyday). She did not have specific questions regarding ST interventions. She is confident she and family member will be able to provide level of assist and 24/7 supervision that has been recommended. SLP reviewed diet and positioning recommendations. Pt consumed puree and dys 3 (mech soft) solids from breakfast tray with thin liquids, exhibiting 2 delayed cough responses throughout intake. Recommend continue current diet. His speech intelligibility reached 60% today with Moderate cues to implement pacing and increased vocal intensity (improved since yesterday). Pt also required Moderate verbal cues for redirection to functional tasks throughout session. Pt left laying in bed set and needs within reach, wife and RN present. Continue per current plan of care.    Patient has met 8 of 8 long term goals.  Patient to discharge at overall Min;Mod level.  Reasons goals not met: n/a   Clinical Impression/Discharge Summary:   Pt made functional gains and met 8 out of 8 long term goals this admission. Pt currently requires Mod assist for use of speech intelligibility strategies (pausing/pacing and increased vocal intensity) to achieve ~50% intelligibility at the word and phrase level due to severe dysarthria. He also requires Mod A for basic problem solving an sustained attention to tasks, and participation can be limited by pain at times. He will require strict 24/7 supervision and hands on care at discharge Pt is consuming dysphagia 3 (mechanical soft) diet with thin  liquids and uses safe swallow strategies with Min A verbal cues. Pt has demonstrated improved awareness, focused and sustained attention, speech intelligibility, and oropharyngeal swallow function throughout his stay. However, given severe dysarthria, mild dysphagia, and cognitive deficits still present, recommend pt continue to receive skilled ST services upon discharge. Pt and family education was ongoing throughout his stay and is complete at this time.   Care Partner:  Caregiver Able to Provide Assistance: Yes  Type of Caregiver Assistance: Cognitive  Recommendation:  Home Health SLP;24 hour supervision/assistance  Rationale for SLP Follow Up: Maximize functional communication;Maximize cognitive function and independence;Maximize swallowing safety;Reduce caregiver burden   Equipment: none   Reasons for discharge: Discharged from hospital   Patient/Family Agrees with Progress Made and Goals Achieved: Yes    Arbutus Leas 07/08/2019, 6:59 AM

## 2019-07-08 NOTE — Plan of Care (Signed)
  Problem: Consults Goal: RH STROKE PATIENT EDUCATION Description: See Patient Education module for education specifics  Outcome: Progressing   Problem: RH BOWEL ELIMINATION Goal: RH STG MANAGE BOWEL WITH ASSISTANCE Description: STG Manage Bowel with mod Assistance. Outcome: Progressing Goal: RH STG MANAGE BOWEL W/MEDICATION W/ASSISTANCE Description: STG Manage Bowel with Medication with mod Assistance. Outcome: Progressing   Problem: RH BLADDER ELIMINATION Goal: RH STG MANAGE BLADDER WITH ASSISTANCE Description: STG Manage Bladder With mod Assistance Outcome: Progressing   Problem: RH SKIN INTEGRITY Goal: RH STG SKIN FREE OF INFECTION/BREAKDOWN Outcome: Progressing Goal: RH STG MAINTAIN SKIN INTEGRITY WITH ASSISTANCE Description: STG Maintain Skin Integrity With mod Assistance. Outcome: Progressing Goal: RH STG ABLE TO PERFORM INCISION/WOUND CARE W/ASSISTANCE Description: STG Able To Perform Incision/Wound Care With max Assistance. Outcome: Progressing   Problem: RH SAFETY Goal: RH STG ADHERE TO SAFETY PRECAUTIONS W/ASSISTANCE/DEVICE Description: STG Adhere to Safety Precautions With min Assistance/Device. Outcome: Progressing   Problem: RH COGNITION-NURSING Goal: RH STG USES MEMORY AIDS/STRATEGIES W/ASSIST TO PROBLEM SOLVE Description: STG Uses Memory Aids/Strategies With min Assistance to Problem Solve. Outcome: Progressing Goal: RH STG ANTICIPATES NEEDS/CALLS FOR ASSIST W/ASSIST/CUES Description: STG Anticipates Needs/Calls for Assist With min Assistance/Cues. Outcome: Progressing   Problem: RH PAIN MANAGEMENT Goal: RH STG PAIN MANAGED AT OR BELOW PT'S PAIN GOAL Description: Less than 3 out of 10 Outcome: Progressing   Problem: RH KNOWLEDGE DEFICIT Goal: RH STG INCREASE KNOWLEDGE OF HYPERTENSION Description: Patient/family will be able to identify medication to treat HTN along with 2 other nonpharmalogical interventions Outcome: Progressing Goal: RH STG INCREASE  KNOWLEDGE OF DYSPHAGIA/FLUID INTAKE Description: Patient will be able to demonstrate understanding of thickened fluid necessity Outcome: Progressing Goal: RH STG INCREASE KNOWLEGDE OF HYPERLIPIDEMIA Outcome: Progressing Goal: RH STG INCREASE KNOWLEDGE OF STROKE PROPHYLAXIS Description: Patient/Family will be able to verbalize to 3 medications for stroke prevention post discharge  Outcome: Progressing

## 2019-07-08 NOTE — Progress Notes (Signed)
Physical Therapy Discharge Summary  Patient Details  Name: Adrian [name] MRN: [id number] Date of Birth: 10/11/1965  Today's Date: 07/08/2019 PT Individual Time: 1404-1505 PT Individual Time Calculation (min): 61 min    Patient has met 5 of 6 long term goals due to improved activity tolerance, improved balance, improved postural control, increased strength, increased range of motion, decreased pain, ability to compensate for deficits, functional use of  right upper extremity, right lower extremity, left upper extremity and left lower extremity, improved attention, improved awareness and improved coordination.  Patient to discharge at a wheelchair level max to [facility].   Patient's care partners attended hands-on training and are independent to provide the necessary physical and cognitive assistance at discharge.  Reasons goals not met: Pt going home via ambulance transport therefore not performing car transfers.  Recommendation:  Patient will benefit from ongoing skilled PT services in home health setting to continue to advance safe functional mobility, address ongoing impairments in B UE and LE strength, tone management, bed mobility training, transfer training, gait training, activity tolerance, trunk control/sitting balance, standing balance, and minimize fall risk.  Equipment: hospital bed, hoyer lift, custom TIS w/c  Reasons for discharge: treatment goals met and discharge from hospital  Patient/family agrees with progress made and goals achieved: Yes  Skilled Therapeutic Interventions/Progress Updates:  Pt received R sidelying in bed with his wife and son present. Pt agreeable to therapy session but requesting to go outside since it is his last day. Pt's family reports no concerns regarding education/training and report confidence performing bed mobility, hoyer lift transfer bed<>TIS w/c, management of TIS w/c features and education regarding pressure relief and B LE stretching for  tone management. Pt's son does report he attempted stand pivot transfer earlier today and it didn't go as smoothly as he was hoping but he learned from it and knows how to modify his technique and improve it for next time. Donned shoes supine in bed with total assist for time management. Supine>sitting R EOB with max assist - pt able to initiate bringing B LEs off EOB and use R UE to assist with trunk upright. L stand pivot to TIS w/c with max assist for lifting/pivoting hips.  During transfer therapist reinforced education to pt's son regarding proper positioning and technique for assist. Transported outside in TIS w/c. While outside discussed discharge plan with recommended follow-up HHPT. Sit<>stand TIS w/c<>R HHA and L UE around therapist's shoulders with max assist - tolerated standing ~3minutes focusing on trunk and B LE hip/knee extension for improved upright posture - demonstrates ability to activate B LE quads for extension but limited hip extension (likely due to pain limiting muscle activation). Transported back inside and with encouragement pt agreeable to ambulate. Gait training ~25ft with 3 Musketeer support and +2 max assist for B LE advancement and balance - pt able to initiate bringing B LEs forward during swing (able to advance L LE more than R LE) but requires manual facilitation each step for increased step length and increased hip abduction (due to adductor tone) - continues to have flexor and adductor tone causing crouched positioning in LEs though improving. Pt requesting to return to bed due to fatigue. R stand pivot to EOB with max assist for lifting/pivoting. Sit>R sidelying with max assist for trunk descent and B LE management into the bed. Deborah, SW, in/out to confirm discharge arrangements and w/c company present to take pt's loaner TIS w/c and deliver it to pt's home. Pt/family report no questions/concerns   at this time and pt states "we did good." Pt left R sidelying in bed with needs  in reach and family present.  PT Discharge Precautions/Restrictions Precautions Precautions: Fall;Other (comment) Precaution Comments: sacral wound Restrictions Weight Bearing Restrictions: No Pain Pain Assessment Pain Scale: Faces Faces Pain Scale: Hurts even more Pain Type: Acute pain Pain Location: Sacrum Pain Orientation: Medial Pain Descriptors / Indicators: Aching;Burning;Grimacing Pain Onset: On-going Pain Intervention(s): Rest;Relaxation;Repositioned;Environmental changes;Distraction Perception  Perception Perception: Within Functional Limits Praxis Praxis: Impaired Praxis Impairment Details: Motor planning  Cognition Overall Cognitive Status: Impaired/Different from baseline Arousal/Alertness: Awake/alert Orientation Level: Oriented X4 Attention: Focused;Sustained Focused Attention: Impaired Sustained Attention: Impaired Problem Solving: Impaired Safety/Judgment: Impaired Sensation Sensation Light Touch: Appears Intact(able to sense light touch on B LEs during screen and reports symmetrical feeling) Hot/Cold: Not tested Proprioception: Impaired by gross assessment Stereognosis: Not tested Coordination Gross Motor Movements are Fluid and Coordinated: No Coordination and Movement Description: coordination impaired due to qadriparesis (B LEs and L UE more impaired), hypertonia (B LEs and L UE more impaired), decreased ROM, and pain Heel Shin Test: unable to perform due to paresis and hypertonia Motor  Motor Motor: Abnormal tone;Hemiplegia;Abnormal postural alignment and control Motor - Discharge Observations: quadriparesis, hypertonia in B LEs and L UE most significantly, impaired motor planning  Mobility Bed Mobility Bed Mobility: Rolling Right;Rolling Left;Right Sidelying to Sit;Sit to Sidelying Right Rolling Right: Moderate Assistance - Patient 50-74%(using bed features) Rolling Left: Minimal Assistance - Patient > 75%(using bed features) Right Sidelying  to Sit: Maximal Assistance - Patient 25-49% Left Sidelying to Sit: Maximal Assistance - Patient 25-49% Supine to Sit: Maximal Assistance - Patient - Patient 25-49% Sit to Supine: Maximal Assistance - Patient 25-49% Sit to Sidelying Right: Maximal Assistance - Patient 25-49% Transfers Transfers: Sit to Stand;Stand to Sit;Stand Pivot Transfers Sit to Stand: Maximal Assistance - Patient 25-49% Stand to Sit: Maximal Assistance - Patient 25-49% Stand Pivot Transfers: Maximal Assistance - Patient 25 - 49% Stand Pivot Transfer Details: Tactile cues for sequencing;Tactile cues for weight shifting;Manual facilitation for weight shifting;Verbal cues for technique;Verbal cues for sequencing;Tactile cues for initiation Transfer (Assistive device): None Locomotion  Gait Ambulation: Yes Gait Assistance: 2 Helpers Gait Distance (Feet): 30 Feet Assistive device: 2 person hand held assist(via 3 Musketeer support) Gait Assistance Details: Tactile cues for sequencing;Tactile cues for initiation;Tactile cues for posture;Tactile cues for placement;Tactile cues for weight shifting;Manual facilitation for weight shifting;Manual facilitation for weight bearing;Manual facilitation for placement;Verbal cues for gait pattern;Verbal cues for technique;Verbal cues for sequencing Gait Gait: Yes Gait Pattern: Impaired Gait Pattern: Step-to pattern;Decreased step length - right;Decreased step length - left;Decreased stride length;Decreased dorsiflexion - right;Decreased dorsiflexion - left;Trunk flexed;Poor foot clearance - left;Poor foot clearance - right;Narrow base of support;Scissoring Stairs / Additional Locomotion Stairs: No Wheelchair Mobility Wheelchair Mobility: Yes Wheelchair Assistance: Dependent - Patient 0% Wheelchair Parts Management: Needs assistance Distance: 150ft - dependent for w/c propulsion in TIS w/c  Trunk/Postural Assessment  Cervical Assessment Cervical Assessment: Exceptions to WFL(forward  head) Thoracic Assessment Thoracic Assessment: Exceptions to WFL(kyphotic with ronded shoulders) Lumbar Assessment Lumbar Assessment: Exceptions to WFL(prefers anterior pelvic tilt with forward trunk flexion in sitting due to sacral wound) Postural Control Postural Control: Deficits on evaluation Trunk Control: able to maintain static sitting balance with feet supported and RUE support up to 2 minutes but when LOB occurs requires max assist to maintain upright Righting Reactions: impaired with delayed and inadequate activation and poor motor planning  Balance Balance Balance Assessed: Yes Static Sitting Balance Static   Sitting - Balance Support: Right upper extremity supported;Feet supported Static Sitting - Level of Assistance: 2: Max assist;3: Mod assist Dynamic Sitting Balance Dynamic Sitting - Balance Support: Feet supported;Right upper extremity supported Dynamic Sitting - Level of Assistance: 2: Max assist Sitting balance - Comments: able to maintain static sitting balance up to 2 minutes with UE support but when LOB occurs pt has impaired righting reactions requiring max assist to stay upright Static Standing Balance Static Standing - Balance Support: During functional activity Static Standing - Level of Assistance: 1: +1 Total assist Dynamic Standing Balance Dynamic Standing - Balance Support: During functional activity Dynamic Standing - Level of Assistance: 1: +2 Total assist Extremity Assessment      RLE Assessment RLE Assessment: Exceptions to WFL Passive Range of Motion (PROM) Comments: limted hip extension due to increased tone and decreased muscle length; now able to get full knee extension; lack of ankle DF past neutral (this has significantly improved with weightbearing and standing) RLE Strength Right Hip Flexion: 2/5 Right Hip Extension: 2-/5 Right Knee Flexion: 2/5 Right Knee Extension: 2/5 Right Ankle Dorsiflexion: 1/5 Right Ankle Plantar Flexion: 1/5 RLE  Tone RLE Tone: Severe;Hypertonic Hypertonic Details: flexor and adductor tone LLE Assessment LLE Assessment: Exceptions to WFL Passive Range of Motion (PROM) Comments: limited hip extension due to increased tone and decreased muscle length (now able to get full knee extension); lack of ankle DF past neutral (this has significantly improved with weightbearing and standing) LLE Strength Left Hip Flexion: 2-/5 Left Hip Extension: 2-/5 Left Knee Flexion: 2-/5 Left Knee Extension: 2-/5 Left Ankle Dorsiflexion: 0/5 Left Ankle Plantar Flexion: 1/5 LLE Tone LLE Tone: Severe;Hypertonic Hypertonic Details: pt had botox injections improving tone while in rehab     M , PT, DPT 07/08/2019, 1:03 PM  

## 2019-07-08 NOTE — Progress Notes (Signed)
Inpatient Rehab Care Coordinator Discharge Note Inpatient Rehabilitation Care Coordinator  Discharge Note  The overall goal for the admission was met for:   Discharge location:Home with wife and son/daughter-in-law  Length of Stay: 27 days with discharge 07/09/19  Discharge activity level:Pt able to complete sidelying to sit EOB with max assist.  Able to move both LEs off of the EOB without assist and increased time, but he needed total assist for translation of his UB up into sitting. Pt transferred to sitting with max assist and performed stand pivot transfer to the w/c with max assist. Able to self feed using foam grips for utensils.  Home/community participation: Limited Participation  Services provided included: MD, RD, PT, OT, SLP, RN, CM, TR, Pharmacy, Neuropsych and SW  Financial Services: Other: Uninsured  Follow-up services arranged: Home Health: PT, OT, SLP, RN, SNA, DME: TIS wheelchair with specialty cushion, semi-electric hospital bed, hoyer lift, hoyer pad,  and Patient/Family has no preference for HH/DME agencies  Comments (or additional information): [facility] T[facility] pharmacy to fill medications for discharge  Medicaid/SSA and Disability application initiated per [employer] Confirmed all paperwork entered as of 06/28/19  Medasist Rep:Kenda Revels [phone]  [facility] [phone]  [facility] Hospital bed, hoyer lift and hoyer pad  [facility] [employer] [phone] Tilt in space wheelchair and specialty cushion  Patient/Family verbalized understanding of follow-up arrangements: Yes   Pt's family - son and wife- present for family education.  Individual responsible for coordination of the follow-up plan:  Wife Patricia [phone]  Confirmed correct DME delivered: ,  B 07/08/2019  Hospital bed, hoyer lift and hoyer pad delivered to the home; TIS wheelchair is on ordered for discharge 07/09/19 and [employer] delivered to home 07/08/19.  Transportation to home at discharge arranged with [facility].  ,  B 

## 2019-07-09 DIAGNOSIS — L8990 Pressure ulcer of unspecified site, unspecified stage: Secondary | ICD-10-CM | POA: Insufficient documentation

## 2019-07-09 MED FILL — OxyCONTIN 15 MG T12A: 15 | 14 days supply | Qty: 28 | Fill #0

## 2019-07-09 NOTE — Progress Notes (Addendum)
[facility] PHYSICAL MEDICINE & REHABILITATION PROGRESS NOTE   Subjective/Complaints:  Not aware of how he will be transported to home.  Informed patient that he will be transported via  Ambulance. The patient asked why he could not be transported in the car however he does not have adequate sitting balance for this.  He also cannot transfer due to quadriparesis in a safe manner with family.  He may be able to use a wheelchair van in a properly secured Tilt in space chair  ROS: Denies CP, SOB N/V/D   Objective:   No results found. No results for input(s): WBC, HGB, HCT, PLT in the last 72 hours. Recent Labs    07/08/19 1149  NA 137  K 3.7  CL 101  CO2 24  GLUCOSE 124*  BUN 7  CREATININE 0.73  CALCIUM 9.0    Intake/Output Summary (Last 24 hours) at 07/09/2019 0942 Last data filed at 07/09/2019 0424 Gross per 24 hour  Intake 260 ml  Output 850 ml  Net -590 ml     Physical Exam: Vital Signs Blood pressure (!) 145/82, pulse 68, temperature 97.9 F (36.6 C), resp. rate 16, height 5\' 11"  (1.803 m), weight 66.3 kg, SpO2 100 %.  General: No acute distress Mood and affect are appropriate Heart: Regular rate and rhythm no rubs murmurs or extra sounds Lungs: Clear to auscultation, breathing unlabored, no rales or wheezes Abdomen: Positive bowel sounds, soft nontender to palpation, nondistended Skin: base is now clean    Musculoskeletal: Full range of motion in all 4 extremities. No joint swelling Motor: LUE: Shoulder abduction 3 -/5, biceps  Left lower extremity: 2-/5 hip flexion, knee extension RUE: 4/5 proximal distal  Assessment/Plan: 1. 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    Stable for D/C today F/u PCP in 3-4 weeks F/u PM&R 2 weeks See D/C summary See D/C instructions Care Tool:  Bathing    Body parts bathed by patient: Chest, Abdomen, Right upper leg, Left upper leg, Face   Body parts bathed by helper: Right lower  leg, Left lower leg, Front perineal area, Buttocks, Left arm, Right arm     Bathing assist Assist Level: Maximal Assistance - Patient 24 - 49%(supine in bed)     Upper Body Dressing/Undressing Upper body dressing   What is the patient wearing?: Pull over shirt    Upper body assist Assist Level: Moderate Assistance - Patient 50 - 74%    Lower Body Dressing/Undressing Lower body dressing      What is the patient wearing?: Pants, Incontinence brief     Lower body assist Assist for lower body dressing: Total Assistance - Patient < 25%(supine rolling)     Toileting Toileting    Toileting assist Assist for toileting: Total Assistance - Patient < 25%     Transfers Chair/bed transfer  Transfers assist  Chair/bed transfer activity did not occur: Safety/medical concerns  Chair/bed transfer assist level: Maximal Assistance - Patient 25 - 49%     Locomotion Ambulation   Ambulation assist   Ambulation activity did not occur: Safety/medical concerns  Assist level: 2 helpers Assistive device: Other (comment)(3 Musketeer support) Max distance: 19ft   Walk 10 feet activity   Assist  Walk 10 feet activity did not occur: Safety/medical concerns  Assist level: 2 helpers Assistive device: Other (comment)(3 Musketeer support)   Walk 50 feet activity   Assist Walk 50 feet with 2 turns activity did not occur: Safety/medical concerns  Walk 150 feet activity   Assist Walk 150 feet activity did not occur: Safety/medical concerns         Walk 10 feet on uneven surface  activity   Assist Walk 10 feet on uneven surfaces activity did not occur: Safety/medical concerns         Wheelchair     Assist Will patient use wheelchair at discharge?: Yes(as dependent transport) Type of Wheelchair: Manual(manual TIS) Wheelchair activity did not occur: Safety/medical concerns  Wheelchair assist level: Dependent - Patient 0% Max wheelchair distance: 145ft     Wheelchair 50 feet with 2 turns activity    Assist    Wheelchair 50 feet with 2 turns activity did not occur: Safety/medical concerns   Assist Level: Dependent - Patient 0%   Wheelchair 150 feet activity     Assist  Wheelchair 150 feet activity did not occur: Safety/medical concerns   Assist Level: Dependent - Patient 0%   Blood pressure (!) 145/82, pulse 68, temperature 97.9 F (36.6 C), resp. rate 16, height 5\' 11"  (1.803 m), weight 66.3 kg, SpO2 100 %.  Medical Problem List and Plan: 1.Dysarthria/quadriparesis/aphasiasecondary to multifocal infarcts mostly posterior circulation but also bilateral anterior circulation cardioembolic pattern as well as innominate artery occlusion, left ICA thrombus versus soft plaque, right V4 and basal artery occlusive thrombus  Continue CIR Plan d/c home with HH RN, PT, OT,SLP 2. Antithrombotics: -DVT/anticoagulation:Lovenox -antiplatelet therapy: Aspirin 325 mg and Plavix 75 mg daily- will not hold plavix unless PEG is scheduled  3. Pain Management:Tramadol as needed  Decubitus pain, off tramadol, ineffective, started oxyIR 5mg  Q6 prn, increased to every 4 on 5/16   oxycontin 15mg  q 12 dose- d/c when pt is discharged  Will Rx 14 day supply of oxyIR    -encourage re-positioning/OOB as much as possible, supportive seating   4. Mood:Wellbutrin 150 mg twice daily, may need trial of Nuedexta, expect PBA given extensive posterior > anterior circulation infarcts -antipsychotic agents: Seroquel 25 mg 3 times daily as needed 5. Neuropsych: This patientis notcapable of making decisions on hisown behalf.  6. Skin/Wound Care:Routine skin checks- sacrococcygeal eschar,resolved with hydrotherapy,Home with Wet- dry  F/u general surgery ,  Seborrheic dermatitis - improved on valisone  7. Fluids/Electrolytes/Nutrition:Routine in and outs  BMP normal 5/20 discontinue IV, off IV fluids since 5/19 8. Post stroke  dysphagia.   D3 thins, continue to advance as tolerated  Consulted IR for PEG placement but will hold off for now given the upgrade in diet  Continue IVF  Oral intake improving 9. Orthostatic hypotension. Monitor with increased mobility Vitals:   07/08/19 2010 07/09/19 0533  BP: 121/81 (!) 145/82  Pulse: 86 68  Resp: 16 16  Temp: 98.1 F (36.7 C) 97.9 F (36.6 C)  SpO2: 100% 100%   Increased propanolol to 10mg  BID on 5/16,controlled  10. Hyperlipidemia. Lipitor 11. Tobacco abuse. NicoDerm patch.  Counseled when possible 12. Leukocytosis. -Per CT abd  RLL atelectasis vs PNA,   afebrile   WBCs 15.2 on 5/14, down to 12.5 on 5/18 13. Chronic spasticity primarily affecting the left elbow flexors, knee flexors, ankle plantar flexor and hip adductor's.    Xeomin injected on 5/14- Left Biceps, adductor longus, hamstrings ,   Cont ROM LOS: 27 days A FACE TO FACE EVALUATION WAS PERFORMED  6/16 07/09/2019, 9:42 AM

## 2019-07-09 NOTE — Plan of Care (Signed)
  Problem: Consults Goal: RH STROKE PATIENT EDUCATION Description: See Patient Education module for education specifics  Outcome: Completed/Met   Problem: RH BOWEL ELIMINATION Goal: RH STG MANAGE BOWEL WITH ASSISTANCE Description: STG Manage Bowel with mod Assistance. Outcome: Completed/Met Goal: RH STG MANAGE BOWEL W/MEDICATION W/ASSISTANCE Description: STG Manage Bowel with Medication with mod Assistance. Outcome: Completed/Met   Problem: RH BLADDER ELIMINATION Goal: RH STG MANAGE BLADDER WITH ASSISTANCE Description: STG Manage Bladder With mod Assistance Outcome: Completed/Met   Problem: RH SKIN INTEGRITY Goal: RH STG SKIN FREE OF INFECTION/BREAKDOWN Outcome: Completed/Met Goal: RH STG MAINTAIN SKIN INTEGRITY WITH ASSISTANCE Description: STG Maintain Skin Integrity With mod Assistance. Outcome: Completed/Met Goal: RH STG ABLE TO PERFORM INCISION/WOUND CARE W/ASSISTANCE Description: STG Able To Perform Incision/Wound Care With max Assistance. Outcome: Completed/Met   Problem: RH SAFETY Goal: RH STG ADHERE TO SAFETY PRECAUTIONS W/ASSISTANCE/DEVICE Description: STG Adhere to Safety Precautions With min Assistance/Device. Outcome: Completed/Met   Problem: RH COGNITION-NURSING Goal: RH STG USES MEMORY AIDS/STRATEGIES W/ASSIST TO PROBLEM SOLVE Description: STG Uses Memory Aids/Strategies With min Assistance to Problem Solve. Outcome: Completed/Met Goal: RH STG ANTICIPATES NEEDS/CALLS FOR ASSIST W/ASSIST/CUES Description: STG Anticipates Needs/Calls for Assist With min Assistance/Cues. Outcome: Completed/Met   Problem: RH PAIN MANAGEMENT Goal: RH STG PAIN MANAGED AT OR BELOW PT'S PAIN GOAL Description: Less than 3 out of 10 Outcome: Completed/Met   Problem: RH KNOWLEDGE DEFICIT Goal: RH STG INCREASE KNOWLEDGE OF HYPERTENSION Description: Patient/family will be able to identify medication to treat HTN along with 2 other nonpharmalogical interventions Outcome:  Completed/Met Goal: RH STG INCREASE KNOWLEDGE OF DYSPHAGIA/FLUID INTAKE Description: Patient will be able to demonstrate understanding of thickened fluid necessity Outcome: Completed/Met Goal: RH STG INCREASE KNOWLEGDE OF HYPERLIPIDEMIA Outcome: Completed/Met Goal: RH STG INCREASE KNOWLEDGE OF STROKE PROPHYLAXIS Description: Patient/Family will be able to verbalize to 3 medications for stroke prevention post discharge  Outcome: Completed/Met

## 2019-07-09 NOTE — Progress Notes (Signed)
Patient discharged off of unit via ambulance with family at his side. PA answered all of family questions and gave family instructions.  Family verbalized understanding. No complications noted at this time.  Trey Paula, RN

## 2019-07-12 ENCOUNTER — Telehealth: Payer: Self-pay

## 2019-07-12 NOTE — Telephone Encounter (Signed)
Adrian [name] contacted office wants letter for disability - emailed and asked for fax to complete if it's possible.

## 2019-07-20 ENCOUNTER — Telehealth: Payer: Self-pay

## 2019-07-20 MED ORDER — OXYCODONE HCL ER 15 MG PO T12A: 15.0000 mg | EXTENDED_RELEASE_TABLET | Freq: Two times a day (BID) | ORAL | 0 refills | Status: DC

## 2019-07-20 MED ORDER — OXYCODONE HCL 5 MG PO TABS: 5.0000 mg | ORAL_TABLET | ORAL | 0 refills | Status: DC | PRN

## 2019-07-20 NOTE — Telephone Encounter (Signed)
Requesting refill on Oxycodone due to bed sores. appt 07/27/2019.  [facility].

## 2019-07-21 NOTE — Telephone Encounter (Signed)
Patients caregiver called again

## 2019-07-22 ENCOUNTER — Telehealth: Payer: Self-pay | Admitting: Physical Medicine & Rehabilitation

## 2019-07-22 ENCOUNTER — Telehealth: Payer: Self-pay

## 2019-07-22 NOTE — Telephone Encounter (Signed)
Family requesting additional oxycodone, have already sent RX to [facility] 6/1.  CMA to notify family that this has been done, too early to refill Oxycontin

## 2019-07-22 NOTE — Telephone Encounter (Signed)
Please call patient that these have both been sent to the [facility] in [place] on 6/1

## 2019-07-22 NOTE — Telephone Encounter (Signed)
Ptn wife called and states he went home with a bed sore and Oxy 15 and Oxy 5 for pain -- will be out of both with in 1 day - please call for refill ---[phone]

## 2019-07-22 NOTE — Telephone Encounter (Signed)
Scripts were sent in, however, patient never notified until now

## 2019-07-23 ENCOUNTER — Telehealth: Payer: Self-pay

## 2019-07-23 MED ORDER — OXYCODONE HCL 5 MG PO TABS: 5.0000 mg | ORAL_TABLET | ORAL | 0 refills | Status: DC | PRN

## 2019-07-23 NOTE — Telephone Encounter (Signed)
[facility] in [place] does not have Oxycodone in stock but [facility] in [place] does. [facility] a prescription be sent [facility] [place]. I will call and cancel the prescription at [facility] in [place].

## 2019-07-23 NOTE — Telephone Encounter (Signed)
Sent RX for oxy IR to [facility]

## 2019-07-23 NOTE — Telephone Encounter (Signed)
Notified. 

## 2019-07-23 NOTE — Telephone Encounter (Signed)
No local pharmacy near his address has Oxycontin 15 mg in stock and he is out of both Oxycodone 5 mg and Oxycontin 15 mg. [name] states pt is in a lot of pain and if could at least get the Oxycodone 5 mg at [facility] in [place] to help with pain.

## 2019-07-27 ENCOUNTER — Encounter: Payer: Medicaid Other | Attending: Physical Medicine & Rehabilitation | Admitting: Physical Medicine & Rehabilitation

## 2019-07-27 ENCOUNTER — Encounter: Payer: Self-pay | Admitting: Physical Medicine & Rehabilitation

## 2019-07-27 ENCOUNTER — Other Ambulatory Visit: Payer: Self-pay

## 2019-07-27 VITALS — BP 128/67 | HR 81

## 2019-07-27 DIAGNOSIS — G8929 Other chronic pain: Secondary | ICD-10-CM | POA: Diagnosis present

## 2019-07-27 DIAGNOSIS — L89154 Pressure ulcer of sacral region, stage 4: Secondary | ICD-10-CM | POA: Diagnosis present

## 2019-07-27 DIAGNOSIS — M5441 Lumbago with sciatica, right side: Secondary | ICD-10-CM | POA: Insufficient documentation

## 2019-07-27 MED ORDER — OXYCODONE HCL 5 MG PO TABS: 5.0000 mg | ORAL_TABLET | ORAL | 0 refills | Status: DC | PRN

## 2019-07-27 MED ORDER — OXYCODONE HCL ER 10 MG PO T12A: 10.0000 mg | EXTENDED_RELEASE_TABLET | Freq: Every day | ORAL | 0 refills | Status: DC

## 2019-07-27 MED ORDER — TIZANIDINE HCL 2 MG PO TABS: 2.0000 mg | ORAL_TABLET | Freq: Three times a day (TID) | ORAL | 0 refills | Status: DC

## 2019-07-27 NOTE — Progress Notes (Signed)
Subjective:    Patient ID: Adrian [name], male    DOB: 1966-01-18, 54 y.o.   MRN: [id number] 53 y.o. right-handed male with history of tobacco abuse as well as chronic back pain.  Lives with wife and son reportedly independent prior to admission.  Presented 06/02/2019 with dysarthria and quadriparesis.  Admission chemistries WBC 21,000 BUN 24 urine drug screen negative.  CT of the head showed multiple bilateral infarcts.  Per report numerous small supratentorial and infratentorial acute infarcts involving the cerebral hemispheres, left thalamus, pons and right cerebellar hemisphere.  Chronic right frontoparietal cortical infarcts.  Follow-up MRI showed slight increase in size of bilateral acute infarcts of the pons.  Unchanged infarcts of the left thalamus left caudate nucleus, right cerebellum and right frontal white matter.  No hemorrhage or mass-effect.  Patient did not receive TPA.  CT angiogram head and neck 06/02/2019 occlusive thrombus within the distal V4 right vertebral artery with occlusive thrombus extending into the proximal and mid basilar artery.  Occlusive thrombus also suspected within the very distal V4 left vertebral artery.  Echocardiogram with ejection fraction of 48% grade 1 diastolic dysfunction.  EEG negative for seizure.  CTA head and neck repeated 06/09/2019 unchanged multivessel occlusion.  Recommendations were for aspirin 325 mg and Plavix daily.  Patient had initially been maintained on intravenous heparin since discontinued changed to Lovenox.  Dysphagia #1 honey thick liquid diet close monitoring of hydration.  Patient very labile as well as anxious placed on Wellbutrin and the addition of Seroquel as needed.  Bouts of orthostasis initially did require fluid resuscitation.  He was on Levophed which is since been discontinued.  Therapy evaluations completed and patient was admitted for a comprehensive rehab program   Admit date: 06/12/2019 Discharge date: 07/09/2019  HPI Adrian [name]  returns for a transitional care visit.  He was discharged approximately 2 weeks ago from the stroke rehabilitation program at [facility].  He is now at home living with his wife and with his son.  He requires assistance for hygiene dressing grooming bathing.  Use of the diaper as well as urinal forbladder needs.  Hospital Bed at home with mattress overlay He has a stage IV sacral decubitus that is being followed by home health.  Patient has not been seen by wound center in the past.  Spasms Right leg, left leg spasms have improved since botulinum toxin injection performed while he was an inpatient at [facility]. Patient has noted increased strength in the left upper extremity since discharge.  Pain Inventory Average Pain 9 Pain Right Now 9 My pain is sharp, burning, stabbing, tingling and aching  In the last 24 hours, has pain interfered with the following? General activity 10 Relation with others 10 Enjoyment of life 10 What TIME of day is your pain at its worst? all Sleep (in general) Poor  Pain is worse with: sitting, standing and some activites Pain improves with: medication Relief from Meds: 5  Mobility walk with assistance ability to climb steps?  no do you drive?  no use a wheelchair needs help with transfers  Function disabled: date disabled . I need assistance with the following:  feeding, dressing, bathing, toileting, meal prep, household duties and shopping  Neuro/Psych trouble walking dizziness confusion depression  Prior Studies transitional  Physicians involved in your care transitional   Family History  Problem Relation Age of Onset  . Hypertension Mother   . Hypertension Father    Social History   Socioeconomic History  .  Marital status: Divorced    Spouse name: Not on file  . Number of children: Not on file  . Years of education: Not on file  . Highest education level: Not on file  Occupational History  . Not on file  Tobacco Use  .  Smoking status: Current Every Day Smoker    Packs/day: 4.00    Types: Cigarettes  . Smokeless tobacco: Never Used  Substance and Sexual Activity  . Alcohol use: Not Currently  . Drug use: Never  . Sexual activity: Not Currently  Other Topics Concern  . Not on file  Social History Narrative   a   Social Determinants of Health   Financial Resource Strain:   . Difficulty of Paying Living Expenses:   Food Insecurity:   . Worried About Programme researcher, broadcasting/film/video in the Last Year:   . Barista in the Last Year:   Transportation Needs:   . Freight forwarder (Medical):   Marland Kitchen Lack of Transportation (Non-Medical):   Physical Activity:   . Days of Exercise per Week:   . Minutes of Exercise per Session:   Stress:   . Feeling of Stress :   Social Connections:   . Frequency of Communication with Friends and Family:   . Frequency of Social Gatherings with Friends and Family:   . Attends Religious Services:   . Active Member of Clubs or Organizations:   . Attends Banker Meetings:   Marland Kitchen Marital Status:    No past surgical history on file. Past Medical History:  Diagnosis Date  . Tobacco abuse    BP 128/67   Pulse 81   SpO2 98%   Opioid Risk Score:   Fall Risk Score:  `1  Depression screen PHQ 2/9  No flowsheet data found.  Review of Systems  Constitutional: Negative.   HENT: Negative.   Eyes: Negative.   Respiratory: Negative.   Cardiovascular: Negative.   Gastrointestinal: Negative.   Endocrine: Negative.   Genitourinary: Negative.   Musculoskeletal: Positive for arthralgias, back pain and gait problem.  Skin: Negative.   Allergic/Immunologic: Negative.   Neurological: Positive for dizziness.  Hematological: Negative.   Psychiatric/Behavioral: Positive for confusion and dysphoric mood.  All other systems reviewed and are negative.      Objective:   Physical Exam Vitals and nursing note reviewed.  Constitutional:      Appearance: He is  ill-appearing.  HENT:     Head: Normocephalic and atraumatic.  Eyes:     General: No visual field deficit.    Extraocular Movements: Extraocular movements intact.     Conjunctiva/sclera: Conjunctivae normal.     Pupils: Pupils are equal, round, and reactive to light.  Cardiovascular:     Rate and Rhythm: Normal rate and regular rhythm.     Heart sounds: Normal heart sounds. No murmur.  Pulmonary:     Effort: Pulmonary effort is normal. No respiratory distress.     Breath sounds: Normal breath sounds. No stridor. No wheezing, rhonchi or rales.  Abdominal:     General: Abdomen is flat. There is no distension.     Palpations: Abdomen is soft.     Tenderness: There is no abdominal tenderness.  Neurological:     Mental Status: He is alert.     Cranial Nerves: Dysarthria present. No facial asymmetry.     Motor: Abnormal muscle tone present.     Coordination: Coordination abnormal.     Gait: Gait abnormal.  Psychiatric:  Attention and Perception: Attention normal.        Mood and Affect: Affect is labile.        Speech: Speech is delayed and slurred.        Behavior: Behavior is slowed.        Cognition and Memory: Memory is impaired.   The patient is nonambulatory   RUE 4/5 deltoid bicep tricep grip LUE 3/5 deltoid bicep tricep grip RLE 4-/5 hip flexor knee extensor ankle dorsiflexor LLE 3-hip flexor knee extensor, 2 - ankle dorsiflexor  Tone :clonus at the right knee, MAS 2 at the hamstrings bilaterally.  MAS 1 at the left bicep, 0 at the right bicep, MAS 1 at the finger flexors on the left side        Assessment & Plan:  #1.  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  His dysphagia has improved and is no longer n.p.o. and does not require tube feeding. Continue home health PT OT, they are waiting until Medicaid kicks in  2.  Sacral decubitus getting smaller compared to size while he was an inpatient.  Looks clean.  Will  make referral to [place] wound care for follow-up

## 2019-07-28 ENCOUNTER — Telehealth: Payer: Self-pay | Admitting: Physical Medicine & Rehabilitation

## 2019-07-28 NOTE — Telephone Encounter (Signed)
Referral was sent to wound care in [place]. They called to notify us patient's wife refused appointment. She stated the appointment he had elsewhere this week wore him out too much.

## 2019-07-29 NOTE — Telephone Encounter (Signed)
Spoke to patients wife and she said she would reschedule the appointment.

## 2019-07-29 NOTE — Telephone Encounter (Signed)
PT NEEDS THIS APPT.  HAVE THIS SCHEDULED ANOTHER TIME

## 2019-08-20 ENCOUNTER — Encounter: Payer: Medicaid Other | Attending: Physical Medicine & Rehabilitation | Admitting: Physical Medicine & Rehabilitation

## 2019-08-20 ENCOUNTER — Encounter: Payer: Self-pay | Admitting: Physical Medicine & Rehabilitation

## 2019-08-20 ENCOUNTER — Other Ambulatory Visit: Payer: Self-pay

## 2019-08-20 VITALS — BP 123/84 | HR 75 | Temp 97.7°F | Ht 71.0 in | Wt 146.2 lb

## 2019-08-20 DIAGNOSIS — L89154 Pressure ulcer of sacral region, stage 4: Secondary | ICD-10-CM | POA: Insufficient documentation

## 2019-08-20 DIAGNOSIS — F4320 Adjustment disorder, unspecified: Secondary | ICD-10-CM | POA: Diagnosis present

## 2019-08-20 DIAGNOSIS — Z5181 Encounter for therapeutic drug level monitoring: Secondary | ICD-10-CM | POA: Insufficient documentation

## 2019-08-20 DIAGNOSIS — Z79891 Long term (current) use of opiate analgesic: Secondary | ICD-10-CM | POA: Insufficient documentation

## 2019-08-20 DIAGNOSIS — G894 Chronic pain syndrome: Secondary | ICD-10-CM | POA: Diagnosis present

## 2019-08-20 DIAGNOSIS — L89620 Pressure ulcer of left heel, unstageable: Secondary | ICD-10-CM | POA: Diagnosis present

## 2019-08-20 DIAGNOSIS — M5441 Lumbago with sciatica, right side: Secondary | ICD-10-CM | POA: Diagnosis present

## 2019-08-20 DIAGNOSIS — G8929 Other chronic pain: Secondary | ICD-10-CM | POA: Diagnosis present

## 2019-08-20 MED ORDER — OXYCODONE HCL 5 MG PO TABS: 5.0000 mg | ORAL_TABLET | Freq: Three times a day (TID) | ORAL | 0 refills | Status: DC | PRN

## 2019-08-20 NOTE — Progress Notes (Addendum)
Subjective:    Patient ID: Adrian [name], male    DOB: December 03, 1965, 54 y.o.   MRN: [id number]  HPI  54 year old male with history of tobacco abuse had onset of dysarthria and quadriparesis on 06/02/2019.  The patient had multiple infarcts in both cerebral hemispheres left thalamus and right cerebellar, bilateral pontine and right frontoparietal cortical infarct areas.  CTA demonstrating occlusive thrombus in the mid basilar artery The patient completed inpatient rehabilitation 07/09/2019.  He has received several home health visits but now it is on hold since his Medicaid is pending approval.  Family MD calling in medicine Atorvastatin, buproprion, Nicoderm 14mg , protonix, inderal 10mg  , tizanidine   The patient's wife is helping patient with dressing and bathing his son helps transfer him from bed to wheelchair.  His wife showed me a picture of his sacral decubitus.  He did not follow through on referral to [place] wound center.  I discussed this with the patient indicating that we do not have the equipment or the expertise to care for his sacral decubitus he also has a left heel decubitus. Patient and his wife agreed to go to the [facility].  A referral was therefore placed  The patient states that he wants to resume smoking because "everyone else is".  His wife states that both her and her son go out to smoke Discussed that smoking would increase risk of another stroke as well as deter she was just here Evaluate wound healing. Taking Oxycodone 5mg  TID and Oxycontin 10mg  Qhs- No CSA no toxicology   Left leg spasms still under good control left upper extremity spasm increasing Pain Inventory Average Pain 9 Pain Right Now 8 My pain is constant, sharp, burning, stabbing, tingling and aching  In the last 24 hours, has pain interfered with the following? General activity 8 Relation with others 8 Enjoyment of life 8 What TIME of day is your pain at its worst?  all Sleep (in general) Poor  Pain is worse with: sitting, standing and some activites Pain improves with: medication Relief from Meds: 5  Mobility ability to climb steps?  no do you drive?  no use a wheelchair needs help with transfers  Function disabled: date disabled . I need assistance with the following:  feeding, dressing, bathing, toileting, meal prep, household duties and shopping  Neuro/Psych trouble walking dizziness confusion depression  Prior Studies Any changes since last visit?  yes  Left heel is painful with wound(eschar tissue)  Physicians involved in your care Any changes since last visit?  no   Family History  Problem Relation Age of Onset  . Hypertension Mother   . Hypertension Father    Social History   Socioeconomic History  . Marital status: Divorced    Spouse name: Not on file  . Number of children: Not on file  . Years of education: Not on file  . Highest education level: Not on file  Occupational History  . Not on file  Tobacco Use  . Smoking status: Current Every Day Smoker    Packs/day: 4.00    Types: Cigarettes  . Smokeless tobacco: Never Used  Vaping Use  . Vaping Use: Never used  Substance and Sexual Activity  . Alcohol use: Not Currently  . Drug use: Never  . Sexual activity: Not Currently  Other Topics Concern  . Not on file  Social History Narrative   a   Social Determinants of Health   Financial Resource Strain:   .  Difficulty of Paying Living Expenses:   Food Insecurity:   . Worried About Programme researcher, broadcasting/film/video in the Last Year:   . Barista in the Last Year:   Transportation Needs:   . Freight forwarder (Medical):   Marland Kitchen Lack of Transportation (Non-Medical):   Physical Activity:   . Days of Exercise per Week:   . Minutes of Exercise per Session:   Stress:   . Feeling of Stress :   Social Connections:   . Frequency of Communication with Friends and Family:   . Frequency of Social Gatherings with  Friends and Family:   . Attends Religious Services:   . Active Member of Clubs or Organizations:   . Attends Banker Meetings:   Marland Kitchen Marital Status:    No past surgical history on file. Past Medical History:  Diagnosis Date  . Tobacco abuse    Ht 5\' 11"  (1.803 m)   BMI 20.39 kg/m   Opioid Risk Score:   Fall Risk Score:  `1  Depression screen PHQ 2/9  Depression screen [facility] 2/9 08/20/2019 07/27/2019  Decreased Interest 3 3  Down, Depressed, Hopeless 3 3  PHQ - 2 Score 6 6  Altered sleeping - 3  Tired, decreased energy - 2  Change in appetite - 0  Feeling bad or failure about yourself  - 3  Trouble concentrating - 0  Moving slowly or fidgety/restless - 0  Suicidal thoughts - 1  PHQ-9 Score - 15    Review of Systems  Constitutional: Negative.   HENT: Negative.   Eyes: Negative.   Respiratory: Negative.   Cardiovascular: Negative.   Gastrointestinal: Negative.   Endocrine: Negative.   Genitourinary: Negative.   Musculoskeletal: Positive for arthralgias, back pain and gait problem.  Skin: Positive for wound.  Allergic/Immunologic: Negative.   Neurological: Positive for dizziness.  Hematological: Negative.   Psychiatric/Behavioral: Positive for confusion and dysphoric mood.  All other systems reviewed and are negative.      Objective:   Physical Exam  Speech with mild to moderate dysarthria decreased breath support Mood and affect irritable he states that his wife treats him like a child rather than as her husband. Motor strength is 4/5 in the right deltoid bicep tricep grip 3 - in the right hip flexor knee extensor ankle dorsiflexor to minus at the left hip flexor knee extensor trace ankle dorsiflexor left upper extremity 3 at the deltoid 3 - bicep tricep and grip.  Tone is MAS 3 in the left elbow flexors, brachioradialis appears prominent MAS 2 at the knee flexors on the left side      Assessment & Plan:  #1.  Bilateral hemiparesis worse on the left  than on the right side with increased spasticity in the left side compared to the right side.  Has had bilateral pontine infarcts in addition has had a right frontal parietal infarct as well As discussed with the patient his dysphagia has improved, his dysarthria has greatly improved as well.  He has regained strength in both upper extremities and to a lesser extent in both lower extremities he is still nonambulatory and requires total assistance with ADLs. Would recommend home health PT OT to resume as well as speech therapy Will need repeat Botox injection to the left upper and left lower limb with recommend 400 units 2.  Chronic pain related to wounds that are healing.  His pressure related sacral decubitus occurred while he was in the ICU as did  his left heel decubitus.  As discussed with wife and patient we do not have the equipment or expertise to adequately treat these wounds.  Therefore referral to wound care was placed once again this time it was too long since wife said she does not know the [place] area as well. He does have pain related to these wounds but they are healing so discussed that we will stop the OxyContin and continue with oxycodone 5 mg immediate release 3 times daily.  Next month we can taper down to hydrocodone.  Will need controlled substance agreement.  As well as toxicology 3.  Adjustment disorder secondary to CVA recommend patient and his wife see neuropsychology for adjustment counseling have written for consult.  They would prefer televisit given his transportation issues

## 2019-08-20 NOTE — Patient Instructions (Addendum)
Need to go to wound center , they have the equipment and expertise to treat you Referral sent to [facility]  Referral to Dr Kieth Brightly, neuropsychology to help with adjustment to stroke  Stay off cigarettes, to prevent another stroke as well as help with wound healing  Will discontinue long acting oxycodone but will cont 3 time per day oxycodone IR 5mg  TID

## 2019-08-26 LAB — DRUG TOX MONITOR 1 W/CONF, ORAL FLD
Amphetamines: NEGATIVE ng/mL (ref <10)
Barbiturates: NEGATIVE ng/mL (ref <10)
Benzodiazepines: NEGATIVE ng/mL (ref <0.50)
Buprenorphine: NEGATIVE ng/mL (ref <0.10)
Cocaine: NEGATIVE ng/mL (ref <5.0)
Codeine: NEGATIVE ng/mL (ref <2.5)
Cotinine: 119.8 ng/mL — ABNORMAL HIGH (ref <5.0)
Dihydrocodeine: NEGATIVE ng/mL (ref <2.5)
Fentanyl: NEGATIVE ng/mL (ref <0.10)
Heroin Metabolite: NEGATIVE ng/mL (ref <1.0)
Hydrocodone: NEGATIVE ng/mL (ref <2.5)
Hydromorphone: NEGATIVE ng/mL (ref <2.5)
MARIJUANA: NEGATIVE ng/mL (ref <2.5)
MDMA: NEGATIVE ng/mL (ref <10)
Meprobamate: NEGATIVE ng/mL (ref <2.5)
Methadone: NEGATIVE ng/mL (ref <5.0)
Morphine: NEGATIVE ng/mL (ref <2.5)
Nicotine Metabolite: POSITIVE ng/mL — AB (ref <5.0)
Norhydrocodone: NEGATIVE ng/mL (ref <2.5)
Noroxycodone: 101.4 ng/mL — ABNORMAL HIGH (ref <2.5)
Opiates: POSITIVE ng/mL — AB (ref <2.5)
Oxycodone: >250 ng/mL — ABNORMAL HIGH (ref <2.5)
Oxymorphone: NEGATIVE ng/mL (ref <2.5)
Phencyclidine: NEGATIVE ng/mL (ref <10)
Tapentadol: NEGATIVE ng/mL (ref <5.0)
Tramadol: NEGATIVE ng/mL (ref <5.0)
Zolpidem: NEGATIVE ng/mL (ref <5.0)

## 2019-08-26 LAB — DRUG TOX ALC METAB W/CON, ORAL FLD: Alcohol Metabolite: NEGATIVE ng/mL (ref <25)

## 2019-08-27 ENCOUNTER — Telehealth: Payer: Self-pay | Admitting: *Deleted

## 2019-08-27 NOTE — Telephone Encounter (Signed)
Oral swab drug screen was consistent for prescribed medications.  ?

## 2019-08-30 ENCOUNTER — Encounter: Payer: Self-pay | Admitting: Neurology

## 2019-08-30 ENCOUNTER — Other Ambulatory Visit: Payer: Self-pay

## 2019-08-30 ENCOUNTER — Ambulatory Visit: Payer: Self-pay | Admitting: Neurology

## 2019-08-30 VITALS — BP 144/100 | HR 99 | Ht 68.0 in

## 2019-08-30 DIAGNOSIS — G8250 Quadriplegia, unspecified: Secondary | ICD-10-CM

## 2019-08-30 DIAGNOSIS — I63012 Cerebral infarction due to thrombosis of left vertebral artery: Secondary | ICD-10-CM

## 2019-08-30 NOTE — Progress Notes (Signed)
[facility] [address] [place]. [place] [address] [phone]       OFFICE FOLLOW-UP NOTE  [name] Date of Birth:  09-Jan-1966 Medical Record Number:  [id number]   HPI: [name] is a 54 year old Caucasian male seen today for initial office follow-up visit following hospital consultation for stroke in April 2021.  History is obtained from the patient and his wife is accompanying him today as well as review of electronic medical records and I personally reviewed available imaging films in PACS. He has a past medical history of hyperlipidemia, tobacco abuse, depression who presented to [facility] on 06/02/2019 with dysarthria and quadriparesis.  CT head showed multiple bilateral infarcts and MRI showed bilateral acute infarcts involving brainstem, left thalamic left caudate nucleus right cerebellum and right frontal white matter.  CT angiogram of the head and neck showed occlusive thrombus in the distal V4 segment of the right vertebral artery as well as proximal and mid basilar artery as well as thrombus involving the aortic arch and innominate arteries.  Echocardiogram showed normal ejection fraction.  LDL cholesterol was 125 mg percent.  Hemoglobin A1c 5.8.  Triglycerides 419.  EEG was negative for seizures.  Patient was felt to have cardioembolic etiology stroke from aortic arch thrombosis and multiple locations in the brain.  He was quite disabled initially and had trouble swallowing and required feeding tube.  He made gradual improvement but remained quadriplegic and was transferred to inpatient rehab on 06/12/2019 where he stayed for a month and was gradually discharged home.  He made gradual improvement able to feed himself and started moving his   right side more than the left side however he was still unable to walk even with assistance and is essentially wheelchair-bound.  He has since quit smoking but he is threatening to go back to smoking again.  He  remains on aspirin and Plavix but has been more than 3 months since dual antiplatelet therapy was started.  Is tolerating well without bruising or bleeding.  His blood pressures well controlled today it is 144/100.  Patient has no insurance and is unable to afford any further home therapy or outpatient therapies. ROS:   14 system review of systems is positive for weakness, incoordination, gait difficulty, imbalance, difficulty walking and all other systems negative PMH:  Past Medical History:  Diagnosis Date  . Stroke (HCC)   . Tobacco abuse     Social History:  Social History   Socioeconomic History  . Marital status: Divorced    Spouse name: Not on file  . Number of children: Not on file  . Years of education: Not on file  . Highest education level: Not on file  Occupational History  . Not on file  Tobacco Use  . Smoking status: Former Smoker    Packs/day: 4.00    Types: Cigarettes  . Smokeless tobacco: Never Used  Vaping Use  . Vaping Use: Never used  Substance and Sexual Activity  . Alcohol use: Not Currently  . Drug use: Never  . Sexual activity: Not Currently  Other Topics Concern  . Not on file  Social History Narrative   a   Social Determinants of Health   Financial Resource Strain:   . Difficulty of Paying Living Expenses:   Food Insecurity:   . Worried About Programme researcher, broadcasting/film/video in the Last Year:   . Barista in the Last Year:   Transportation Needs:   . Lack  of Transportation (Medical):   Marland Kitchen Lack of Transportation (Non-Medical):   Physical Activity:   . Days of Exercise per Week:   . Minutes of Exercise per Session:   Stress:   . Feeling of Stress :   Social Connections:   . Frequency of Communication with Friends and Family:   . Frequency of Social Gatherings with Friends and Family:   . Attends Religious Services:   . Active Member of Clubs or Organizations:   . Attends Banker Meetings:   Marland Kitchen Marital Status:   Intimate Partner  Violence:   . Fear of Current or Ex-Partner:   . Emotionally Abused:   Marland Kitchen Physically Abused:   . Sexually Abused:     Medications:   Current Outpatient Medications on File Prior to Visit  Medication Sig Dispense Refill  . acetaminophen (TYLENOL) 325 MG tablet Take 1-2 tablets (325-650 mg total) by mouth every 4 (four) hours as needed for mild pain.    Marland Kitchen atorvastatin (LIPITOR) 80 MG tablet Take 1 tablet (80 mg total) by mouth daily. 30 tablet 0  . buPROPion (WELLBUTRIN) 75 MG tablet Take 2 tablets (150 mg total) by mouth 2 (two) times daily. 60 tablet 0  . clopidogrel (PLAVIX) 75 MG tablet Take 1 tablet (75 mg total) by mouth daily. 30 tablet 0  . gabapentin (NEURONTIN) 300 MG capsule Take 2 capsules (600 mg total) by mouth 3 (three) times daily. 90 capsule 0  . nicotine (NICODERM CQ - DOSED IN MG/24 HOURS) 14 mg/24hr patch 14 mg patch daily for three weeks then 7 mg patch daily for three weeks and stop 28 patch 0  . oxyCODONE (OXY IR/ROXICODONE) 5 MG immediate release tablet Take 1 tablet (5 mg total) by mouth 3 (three) times daily as needed for severe pain. 90 tablet 0  . pantoprazole sodium (PROTONIX) 40 mg/20 mL PACK Take 20 mLs (40 mg total) by mouth daily. 30 mL 0  . propranolol (INDERAL) 10 MG tablet Take 1 tablet (10 mg total) by mouth 2 (two) times daily. 60 tablet 0  . tiZANidine (ZANAFLEX) 2 MG tablet Take 1 tablet (2 mg total) by mouth 3 (three) times daily. 90 tablet 0   No current facility-administered medications on file prior to visit.    Allergies:   Allergies  Allergen Reactions  . Codeine Other (See Comments)    unknown  . Motrin [Ibuprofen] Other (See Comments)    unknown  . Penicillins Other (See Comments)    unknown    Physical Exam General: Frail middle-age Caucasian male seated, in no evident distress Head: head normocephalic and atraumatic.  Neck: supple with no carotid or supraclavicular bruits Cardiovascular: regular rate and rhythm, no  murmurs Musculoskeletal: no deformity Skin:  no rash/petichiae left heel ulcer Vascular:  Normal pulses all extremities Vitals:   08/30/19 1042  BP: (!) 144/100  Pulse: 99   Neurologic Exam Mental Status: Awake and fully alert.  disriented to place and time. Recent and remote memory poor attention span, concentration and fund of knowledge diminished mood and affect appropriate.  Cranial Nerves: Fundoscopic exam reveals sharp disc margins. Pupils equal, briskly reactive to light. Extraocular movements full without nystagmus. Visual fields full to confrontation. Hearing mildly diminished facial sensation intact. Face, tongue, palate moves normally and symmetrically.  Motor: Spastic left hemiparesis with grade 2-3/5 strength in left upper and lower extremities with significant weakness in left grip and intrinsic hand muscles as well as left hip flexors ankle dorsiflexors.  Mild right hemiparesis with weakness of right grip and right hip flexors and ankle dorsiflexors mainly.  Tone is increased more on the left than the right. Sensory.: intact to touch ,pinprick .position and vibratory sensation.  Coordination: Impaired on the left proportion to the weakness.  Slow but accurate on the right. Gait and Station: Deferred as patient is in a wheelchair and unable to ambulate even with assistance. Reflexes: 2+ and brisk on the right and 3+ and exaggerated on the left.  Toes downgoing.     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  Patient has significant residual quadriparesis left greater than right and is nonambulatory and significantly disabled.  Vascular risk factors of hyperlipidemia smoking and large vessel  athero thrombosis.    PLAN: I had a long d/w patient and his wife about his recent stroke,resiual quadriparesis, risk for recurrent stroke/TIAs, personally independently reviewed imaging studies and stroke evaluation results and answered questions.Continue Plavix 75 mg daily alone now and stop aspirin since it has been more than 3 months of dual antiplatelet therapy since his stroke for secondary stroke prevention and maintain strict control of hypertension with blood pressure goal below 130/90, diabetes with hemoglobin A1c goal below 6.5% and lipids with LDL cholesterol goal below 70 mg/dL. I also advised the patient to eat a healthy diet with plenty of whole grains, cereals, fruits and vegetables, exercise regularly and maintain ideal body weight.  He was counseled to quit smoking and not restarted.  Patient unfortunately has no insurance and cannot afford any more physical and occupational therapy which he needs.  Followup in the future with me only as necessary and no schedule appointment was made. Greater than 50% of time during this 30 minute visit was spent on counseling,explanation of diagnosis of quadriparesis and basilar artery occlusion and thrombosis, planning of further management, discussion with patient and family and coordination of care Delia Heady, MD  [facility] [address] [place], [place] [address]  Phone [phone] Fax [phone] Note: This document was prepared with digital dictation and possible smart phrase technology. Any transcriptional errors that result from this process are unintentional

## 2019-08-30 NOTE — Patient Instructions (Signed)
I had a long d/w patient and his wife about his recent stroke,resiual quadriparesis, risk for recurrent stroke/TIAs, personally independently reviewed imaging studies and stroke evaluation results and answered questions.Continue Plavix 75 mg daily alone now and stop aspirin since it has been more than 3 months of dual antiplatelet therapy since his stroke for secondary stroke prevention and maintain strict control of hypertension with blood pressure goal below 130/90, diabetes with hemoglobin A1c goal below 6.5% and lipids with LDL cholesterol goal below 70 mg/dL. I also advised the patient to eat a healthy diet with plenty of whole grains, cereals, fruits and vegetables, exercise regularly and maintain ideal body weight.  He was counseled to quit smoking and not restarted.  Patient unfortunately has no insurance and cannot afford any more physical and occupational therapy which he needs.  Followup in the future with me only as necessary and no schedule appointment was made.  Stroke Prevention Some medical conditions and behaviors are associated with a higher chance of having a stroke. You can help prevent a stroke by making nutrition, lifestyle, and other changes, including managing any medical conditions you may have. What nutrition changes can be made?   Eat healthy foods. You can do this by: ? Choosing foods high in fiber, such as fresh fruits and vegetables and whole grains. ? Eating at least 5 or more servings of fruits and vegetables a day. Try to fill half of your plate at each meal with fruits and vegetables. ? Choosing lean protein foods, such as lean cuts of meat, poultry without skin, fish, tofu, beans, and nuts. ? Eating low-fat dairy products. ? Avoiding foods that are high in salt (sodium). This can help lower blood pressure. ? Avoiding foods that have saturated fat, trans fat, and cholesterol. This can help prevent high cholesterol. ? Avoiding processed and premade foods.  Follow your  health care provider's specific guidelines for losing weight, controlling high blood pressure (hypertension), lowering high cholesterol, and managing diabetes. These may include: ? Reducing your daily calorie intake. ? Limiting your daily sodium intake to 1,500 milligrams (mg). ? Using only healthy fats for cooking, such as olive oil, canola oil, or sunflower oil. ? Counting your daily carbohydrate intake. What lifestyle changes can be made?  Maintain a healthy weight. Talk to your health care provider about your ideal weight.  Get at least 30 minutes of moderate physical activity at least 5 days a week. Moderate activity includes brisk walking, biking, and swimming.  Do not use any products that contain nicotine or tobacco, such as cigarettes and e-cigarettes. If you need help quitting, ask your health care provider. It may also be helpful to avoid exposure to secondhand smoke.  Limit alcohol intake to no more than 1 drink a day for nonpregnant women and 2 drinks a day for men. One drink equals 12 oz of beer, 5 oz of wine, or 1 oz of hard liquor.  Stop any illegal drug use.  Avoid taking birth control pills. Talk to your health care provider about the risks of taking birth control pills if: ? You are over 105 years old. ? You smoke. ? You get migraines. ? You have ever had a blood clot. What other changes can be made?  Manage your cholesterol levels. ? Eating a healthy diet is important for preventing high cholesterol. If cholesterol cannot be managed through diet alone, you may also need to take medicines. ? Take any prescribed medicines to control your cholesterol as told by your  health care provider.  Manage your diabetes. ? Eating a healthy diet and exercising regularly are important parts of managing your blood sugar. If your blood sugar cannot be managed through diet and exercise, you may need to take medicines. ? Take any prescribed medicines to control your diabetes as told by  your health care provider.  Control your hypertension. ? To reduce your risk of stroke, try to keep your blood pressure below 130/80. ? Eating a healthy diet and exercising regularly are an important part of controlling your blood pressure. If your blood pressure cannot be managed through diet and exercise, you may need to take medicines. ? Take any prescribed medicines to control hypertension as told by your health care provider. ? Ask your health care provider if you should monitor your blood pressure at home. ? Have your blood pressure checked every year, even if your blood pressure is normal. Blood pressure increases with age and some medical conditions.  Get evaluated for sleep disorders (sleep apnea). Talk to your health care provider about getting a sleep evaluation if you snore a lot or have excessive sleepiness.  Take over-the-counter and prescription medicines only as told by your health care provider. Aspirin or blood thinners (antiplatelets or anticoagulants) may be recommended to reduce your risk of forming blood clots that can lead to stroke.  Make sure that any other medical conditions you have, such as atrial fibrillation or atherosclerosis, are managed. What are the warning signs of a stroke? The warning signs of a stroke can be easily remembered as BEFAST.  B is for balance. Signs include: ? Dizziness. ? Loss of balance or coordination. ? Sudden trouble walking.  E is for eyes. Signs include: ? A sudden change in vision. ? Trouble seeing.  F is for face. Signs include: ? Sudden weakness or numbness of the face. ? The face or eyelid drooping to one side.  A is for arms. Signs include: ? Sudden weakness or numbness of the arm, usually on one side of the body.  S is for speech. Signs include: ? Trouble speaking (aphasia). ? Trouble understanding.  T is for time. ? These symptoms may represent a serious problem that is an emergency. Do not wait to see if the  symptoms will go away. Get medical help right away. Call your local emergency services (911 in the [place] Do not drive yourself to the hospital.  Other signs of stroke may include: ? A sudden, severe headache with no known cause. ? Nausea or vomiting. ? Seizure. Where to find more information For more information, visit:  American Stroke Association: www.strokeassociation.org  National Stroke Association: www.stroke.org Summary  You can prevent a stroke by eating healthy, exercising, not smoking, limiting alcohol intake, and managing any medical conditions you may have.  Do not use any products that contain nicotine or tobacco, such as cigarettes and e-cigarettes. If you need help quitting, ask your health care provider. It may also be helpful to avoid exposure to secondhand smoke.  Remember BEFAST for warning signs of stroke. Get help right away if you or a loved one has any of these signs. This information is not intended to replace advice given to you by your health care provider. Make sure you discuss any questions you have with your health care provider. Document Revised: 01/17/2017 Document Reviewed: 03/12/2016 Elsevier Patient Education  2020 [employer].

## 2019-09-01 ENCOUNTER — Other Ambulatory Visit: Payer: Self-pay | Admitting: Physical Medicine & Rehabilitation

## 2019-09-14 ENCOUNTER — Encounter (HOSPITAL_BASED_OUTPATIENT_CLINIC_OR_DEPARTMENT_OTHER): Payer: Medicaid Other | Admitting: Internal Medicine

## 2019-09-22 DIAGNOSIS — Z0289 Encounter for other administrative examinations: Secondary | ICD-10-CM

## 2019-09-23 ENCOUNTER — Telehealth: Payer: Self-pay

## 2019-09-23 MED ORDER — HYDROCODONE-ACETAMINOPHEN 5-325 MG PO TABS: 1.0000 | ORAL_TABLET | Freq: Three times a day (TID) | ORAL | 0 refills | Status: AC | PRN

## 2019-09-23 NOTE — Telephone Encounter (Signed)
Refill request Oxycodone 5 mg, last prescribed 08/20/2019

## 2019-09-27 DIAGNOSIS — Z0271 Encounter for disability determination: Secondary | ICD-10-CM

## 2019-10-01 ENCOUNTER — Ambulatory Visit: Payer: Self-pay | Admitting: Physical Medicine & Rehabilitation

## 2019-10-06 ENCOUNTER — Other Ambulatory Visit: Payer: Self-pay | Admitting: Physical Medicine & Rehabilitation

## 2019-10-13 ENCOUNTER — Encounter (HOSPITAL_BASED_OUTPATIENT_CLINIC_OR_DEPARTMENT_OTHER): Payer: Medicaid Other | Attending: Internal Medicine | Admitting: Physician Assistant

## 2019-10-13 DIAGNOSIS — Z885 Allergy status to narcotic agent status: Secondary | ICD-10-CM | POA: Insufficient documentation

## 2019-10-13 DIAGNOSIS — Z880 Allergy status to penicillin: Secondary | ICD-10-CM | POA: Diagnosis not present

## 2019-10-13 DIAGNOSIS — I10 Essential (primary) hypertension: Secondary | ICD-10-CM | POA: Diagnosis not present

## 2019-10-13 DIAGNOSIS — L89620 Pressure ulcer of left heel, unstageable: Secondary | ICD-10-CM | POA: Diagnosis not present

## 2019-10-13 DIAGNOSIS — Z8673 Personal history of transient ischemic attack (TIA), and cerebral infarction without residual deficits: Secondary | ICD-10-CM | POA: Diagnosis not present

## 2019-10-13 DIAGNOSIS — Z8249 Family history of ischemic heart disease and other diseases of the circulatory system: Secondary | ICD-10-CM | POA: Insufficient documentation

## 2019-10-13 DIAGNOSIS — Z87891 Personal history of nicotine dependence: Secondary | ICD-10-CM | POA: Diagnosis not present

## 2019-10-13 DIAGNOSIS — L89153 Pressure ulcer of sacral region, stage 3: Secondary | ICD-10-CM | POA: Insufficient documentation

## 2019-10-14 ENCOUNTER — Other Ambulatory Visit (HOSPITAL_COMMUNITY)
Admission: RE | Admit: 2019-10-14 | Discharge: 2019-10-14 | Disposition: A | Discharge status: Home / Self Care | Payer: Medicaid Other | Source: Other Acute Inpatient Hospital | Attending: Physician Assistant | Admitting: Physician Assistant

## 2019-10-14 DIAGNOSIS — L03116 Cellulitis of left lower limb: Secondary | ICD-10-CM | POA: Insufficient documentation

## 2019-10-14 NOTE — Progress Notes (Signed)
Adrian, [name] ([id number]) Visit Report for 10/13/2019 Abuse/Suicide Risk Screen Details Patient Name: Date of Service: Adrian [name], Adrian [name] 10/13/2019 2:45 PM Medical Record Number: [id number] Patient Account Number: [id number] Date of Birth/Sex: Treating RN: November 11, 1965 (54 y.o. Adrian [name] Primary Care Adrian [name]: Adrian [name] Other Clinician: Referring Adrian [name]: Treating Adrian [name]/Extender: Adrian [name], Adrian [name]: 0 Abuse/Suicide Risk Screen Items Answer ABUSE RISK SCREEN: Has anyone close to you tried to hurt or harm you recentlyo No Do you feel uncomfortable with anyone in your familyo No Has anyone forced you do things that you didnt want to doo No Electronic Signature(s) Signed: 10/14/2019 4:47:33 PM By: Adrian Abts RN, BSN Entered By: Adrian [name] on 10/13/2019 16:29:00 -------------------------------------------------------------------------------- Activities of Daily Living Details Patient Name: Date of Service: Adrian, [name] 10/13/2019 2:45 PM Medical Record Number: [id number] Patient Account Number: [id number] Date of Birth/Sex: Treating RN: 1965/12/05 (54 y.o. Adrian [name] Primary Care Ameera Tigue: Adrian [name] Other Clinician: Referring Dimitry [name]: Treating Adrian [name]/Extender: Adrian [name], Adrian [name]: 0 Activities of Daily Living Items Answer Activities of Daily Living (Please select one for each item) Drive Automobile Not Able T Medications ake Need Assistance Use T elephone Need Assistance Care for Appearance Need Assistance Use T oilet Need Assistance Bath / Shower Need Assistance Dress Self Need Assistance Feed Self Need Assistance Walk Need Assistance Get In / Out Bed Need Assistance Housework Need Assistance Prepare Meals Need Assistance Handle Money Need Assistance Shop for Self Need Assistance Electronic Signature(s) Signed: 10/14/2019 4:47:33 PM By: Adrian Abts RN, BSN Entered By: Adrian [name] on 10/13/2019 16:29:57 -------------------------------------------------------------------------------- Education Screening Details Patient Name: Date of Service: Adrian [name] 10/13/2019 2:45 PM Medical Record Number: [id number] Patient Account Number: [id number] Date of Birth/Sex: Treating RN: 10-07-1965 (54 y.o. Adrian [name] Primary Care Indio Santilli: Adrian [name] Other Clinician: Referring Adrian [name]: Treating Adrian [name]/Extender: Adrian [name], Adrian [name]: 0 Primary Learner Assessed: Patient Learning Preferences/Education Level/Primary Language Learning Preference: Explanation, Demonstration, Printed Material Highest Education Level: High School Preferred Language: English Cognitive Barrier Language Barrier: No Translator Needed: No Memory Deficit: No Emotional Barrier: No Cultural/Religious Beliefs Affecting Medical Care: No Physical Barrier Impaired Vision: No Impaired Hearing: No Decreased Hand dexterity: No Knowledge/Comprehension Knowledge Level: High Comprehension Level: High Ability to understand written instructions: High Ability to understand verbal instructions: High Motivation Anxiety Level: Calm Cooperation: Cooperative Education Importance: Acknowledges Need Interest in Health Problems: Asks Questions Perception: Coherent Willingness to Engage in Self-Management High Activities: Readiness to Engage in Self-Management High Activities: Electronic Signature(s) Signed: 10/14/2019 4:47:33 PM By: Adrian Abts RN, BSN Entered By: Adrian [name] on 10/13/2019 16:30:19 -------------------------------------------------------------------------------- Fall Risk Assessment Details Patient Name: Date of Service: Adrian [name]. 10/13/2019 2:45 PM Medical Record Number: [id number] Patient Account Number: [id number] Date of Birth/Sex: Treating RN: [birth year] (54 y.o. Adrian [name] Primary Care Vanice Rappa: Marin [name] Other  Clinician: Referring Adrian [name]: Treating Adrian [name]/Extender: Adrian [name], Adrian [name]: 0 Fall Risk Assessment Items Have you had 2 or more falls in the last 12 monthso 0 No Have you had any fall that resulted in injury in the last 12 monthso 0 No FALLS RISK SCREEN History of falling - immediate or within 3 months 0 No Secondary diagnosis (Do you have 2 or more medical diagnoseso) 15 Yes Ambulatory aid None/bed rest/wheelchair/nurse 0 Yes Crutches/cane/walker 0 No Furniture 0 No Intravenous therapy Access/Saline/Heparin Lock 0 No Gait/Transferring Normal/ bed  rest/ wheelchair 0 Yes Weak (short steps with or without shuffle, stooped but able to lift head while walking, may seek 0 No support from furniture) Impaired (short steps with shuffle, may have difficulty arising from chair, head down, impaired 0 No balance) Mental Status Oriented to own ability 0 Yes Electronic Signature(s) Signed: 10/14/2019 4:47:33 PM By: Adrian Abts RN, BSN Entered By: Adrian [name] on 10/13/2019 16:31:07 -------------------------------------------------------------------------------- Foot Assessment Details Patient Name: Date of Service: Adrian [name]. 10/13/2019 2:45 PM Medical Record Number: [id number] Patient Account Number: [id number] Date of Birth/Sex: Treating RN: 12-17-1965 (55 y.o. Adrian [name] Primary Care Adrian [name]: Adrian [name] Other Clinician: Referring Adrian [name]: Treating Adrian [name]: Adrian [name], Adrian [name]: 0 Foot Assessment Items Site Locations + = Sensation present, - = Sensation absent, C = Callus, U = Ulcer R = Redness, W = Warmth, M = Maceration, PU = Pre-ulcerative lesion F = Fissure, S = Swelling, D = Dryness Assessment Right: Left: Other Deformity: No No Prior Foot Ulcer: No No Prior Amputation: No No Charcot Joint: No No Ambulatory Status: Non-ambulatory Assistance Device: Wheelchair Gait: Investment banker, corporate) Signed: 10/14/2019 4:47:33 PM By: Adrian Abts RN, BSN Entered By: Adrian [name] on 10/13/2019 16:31:47 -------------------------------------------------------------------------------- Nutrition Risk Screening Details Patient Name: Date of Service: Adrian [name] 10/13/2019 2:45 PM Medical Record Number: [id number] Patient Account Number: [id number] Date of Birth/Sex: Treating RN: 05/23/1965 (54 y.o. Adrian [name] Primary Care Dalores Weger: Adrian [name] Other Clinician: Referring Denetria Luevanos: Treating Jacilyn Sanpedro/Extender: Adrian [name], Adrian [name]: 0 Height (in): 70 Weight (lbs): 160 Body Mass Index (BMI): 23 Nutrition Risk Screening Items Score Screening NUTRITION RISK SCREEN: I have an illness or condition that made me change the kind and/or amount of food I eat 0 No I eat fewer than two meals per day 0 No I eat few fruits and vegetables, or milk products 0 No I have three or more drinks of beer, liquor or wine almost every day 0 No I have tooth or mouth problems that make it hard for me to eat 0 No I don't always have enough money to buy the food I need 0 No I eat alone most of the time 0 No I take three or more different prescribed or over-the-counter drugs a day 1 Yes Without wanting to, I have lost or gained 10 pounds in the last six months 0 No I am not always physically able to shop, cook and/or feed myself 2 Yes Nutrition Protocols Good Risk Protocol Moderate Risk Protocol 0 Provide education on nutrition High Risk Proctocol Risk Level: Moderate Risk Score: 3 Electronic Signature(s) Signed: 10/14/2019 4:47:33 PM By: Adrian Abts RN, BSN Entered By: Adrian [name] on 10/13/2019 16:31:17

## 2019-10-14 NOTE — Progress Notes (Addendum)
Adrian [name], Adrian R. ([id number]) Visit Report for 10/13/2019 Chief Complaint Document Details Patient Name: Date of Service: MO Adrian CallerRA N, [name] 10/13/2019 2:45 PM Medical Record Number: [id number] Patient Account Number: [id number] Date of Birth/Sex: Treating RN: 12/09/1965 (54 y.o. Adrian [name]) Boehlein, Linda Primary Care Provider: Marin [name], Adrian [name] Other Clinician: Referring Provider: Treating Provider/Extender: Adrian [name], Adrian [name] WELLS, Adrian [name] Weeks in Treatment: 0 Information Obtained from: Patient Chief Complaint Left heel pressure ulcer and sacral ulcer Electronic Signature(s) Signed: 10/13/2019 5:07:43 PM By: Lenda KelpStone [name], Akya Fiorello PA-C Entered By: Lenda KelpStone [name], Chole Driver on 10/13/2019 17:07:43 -------------------------------------------------------------------------------- Debridement Details Patient Name: Date of Service: MO RA Adrian [name], Adrian R. 10/13/2019 2:45 PM Medical Record Number: [id number] Patient Account Number: [id number] Date of Birth/Sex: Treating RN: 09/22/1965 (54 y.o. Adrian [name]) Boehlein, Linda Primary Care Provider: Marin [name], Adrian [name] Other Clinician: Referring Provider: Treating Provider/Extender: Adrian [name], Jolly Carlini WELLS, Adrian [name] Weeks in Treatment: 0 Debridement Performed for Assessment: Wound #1 Left Calcaneus Performed By: Clinician Adrian [name], Linda, RN Debridement Type: Chemical/Enzymatic/Mechanical Agent Used: Santyl Level of Consciousness (Pre-procedure): Awake and Alert Pre-procedure Verification/Time Out Yes - 17:26 Taken: Bleeding: None Response to Treatment: Procedure was tolerated well Level of Consciousness (Post- Awake and Alert procedure): Post Debridement Measurements of Total Wound Length: (cm) 3.5 Stage: Unstageable/Unclassified Width: (cm) 5.5 Depth: (cm) 0.1 Volume: (cm) 1.512 Character of Wound/Ulcer Post Debridement: Requires Further Debridement Post Procedure Diagnosis Same as Pre-procedure Electronic Signature(s) Signed: 10/13/2019 6:40:41 PM By: Adrian [name], Linda RN, BSN Signed:  10/13/2019 7:06:00 PM By: Lenda KelpStone [name], Verlaine Embry PA-C Entered By: Adrian [name], Linda on 10/13/2019 17:26:49 -------------------------------------------------------------------------------- HPI Details Patient Name: Date of Service: MO RA Adrian SlimN, [name] 10/13/2019 2:45 PM Medical Record Number: [id number] Patient Account Number: [id number] Date of Birth/Sex: Treating RN: 02/17/1966 (54 y.o. Adrian [name]) Boehlein, Linda Primary Care Provider: Marin [name], Adrian [name] Other Clinician: Referring Provider: Treating Provider/Extender: Adrian [name], Adrian [name] WELLS, Adrian [name] Weeks in Treatment: 0 History of Present Illness HPI Description: 10/13/2019 upon evaluation today patient presents for initial inspection here in our clinic concerning an issue he has been having with his heel as well as his sacral region. He has wounds of both locations which are pressure related and occurred as a result of his stroke and following inpatient stay in rehab. Unfortunately he developed these wounds at that time and has been dealing with issues since that time. He does have somewhat limited mobility has generalized weakness all over though he does have some motion of his legs it is not very coordinated he is not able to stand up on his own to legs. He does have as well what appears to be somewhat poor inhibitions as far as what he says he pretty much said whenever comes to his line. With that being said it did not appear to be directed in any 1 person in particular to be honest that he did use several words and phrases that I would not repeat here he did not seem to be out of character for what he normally would do to be perfectly honest. I did not take offense. With that being said I do think that he is good and needs some help as far as try to get these wounds to heal a big part of this is good to be talking him into staying off of his gluteal region to try to allow this to heal. He has been using Santyl on the sacral region and Neosporin on the heel. These  occurred towards the end of April/beginning of May when he is was in inpatient rehab. The stroke  occurred in April that left him generalized weak at this point. His most recent hemoglobin A1c was 5.8 and that was on the last check. Electronic Signature(s) Signed: 10/13/2019 7:01:35 PM By: Lenda Kelp PA-C Entered By: Lenda Kelp on 10/13/2019 19:01:35 -------------------------------------------------------------------------------- Physical Exam Details Patient Name: Date of Service: MO Adrian [name] 10/13/2019 2:45 PM Medical Record Number: [id number] Patient Account Number: [id number] Date of Birth/Sex: Treating RN: Jul 12, 1965 (54 y.o. Adrian Schooner Primary Care Provider: Marin Comment Other Clinician: Referring Provider: Treating Provider/Extender: Adrian Pauls, Adrian [name] Weeks in Treatment: 0 Constitutional patient is hypertensive.. pulse regular and within target range for patient.Marland Kitchen respirations regular, non-labored and within target range for patient.Marland Kitchen temperature within target range for patient.. Well-nourished and well-hydrated in no acute distress. Eyes conjunctiva clear no eyelid edema noted. pupils equal round and reactive to light and accommodation. Ears, Nose, Mouth, and Throat no gross abnormality of ear auricles or external auditory canals. normal hearing noted during conversation. mucus membranes moist. Respiratory normal breathing without difficulty. Cardiovascular 2+ dorsalis pedis/posterior tibialis pulses. no clubbing, cyanosis, significant edema, <3 sec cap refill. Musculoskeletal Patient unable to walk. Psychiatric this patient is able to make decisions and demonstrates good insight into disease process. Alert and Oriented x 3. pleasant and cooperative. Notes Upon inspection today patient appears to be doing somewhat poorly in regard to his wounds to be honest. He does appear to be very agitated but to be honest I think this is really more an  issue just with his general demeanor at this point and I think in someway the stroke has affected his inhibitions as far as what he says. Nonetheless I did not take offense that any of the things he said. With that being said I do believe that the patient is going to require some debridement at some point 1 where another in regard to his heel to try to get this area to heal. I discussed this with the patient as well as his wife and son who were present during evaluation today. With that being said the patient was adamant that he does not want to come back here. Nonetheless I do believe that this is something he will need to follow-up with [place] ongoing for quite a few visits to get this completely healed. Electronic Signature(s) Signed: 10/13/2019 7:02:42 PM By: Lenda Kelp PA-C Entered By: Lenda Kelp on 10/13/2019 19:02:42 -------------------------------------------------------------------------------- Physician Orders Details Patient Name: Date of Service: MO RA Adrian Junes. 10/13/2019 2:45 PM Medical Record Number: [id number] Patient Account Number: [id number] Date of Birth/Sex: Treating RN: 30-Jan-1966 (54 y.o. Adrian Schooner Primary Care Provider: Marin Comment Other Clinician: Referring Provider: Treating Provider/Extender: Adrian Pauls, Adrian [name] Weeks in Treatment: 0 Verbal / Phone Orders: No Diagnosis Coding ICD-10 Coding Code Description L89.620 Pressure ulcer of left heel, unstageable L89.153 Pressure ulcer of sacral region, stage 3 M62.81 Muscle weakness (generalized) Follow-up Appointments Return Appointment in 2 weeks. Dressing Change Frequency Wound #1 Left Calcaneus Change dressing every day. Wound #2 Sacrum Change dressing every day. Wound Cleansing Wound #1 Left Calcaneus May shower and wash wound with soap and water. Primary Wound Dressing Wound #1 Left Calcaneus Santyl Ointment - cover with saline moistened gauze Wound #2 Sacrum Calcium Alginate  with Silver Secondary Dressing Wound #1 Left Calcaneus Kerlix/Rolled Gauze Dry Gauze Heel Cup Wound #2 Sacrum Foam Border - or large bandaid Off-Loading Turn and reposition every 2 hours Laboratory naerobe culture (MICRO) - left heel Bacteria identified  in Unspecified specimen by A LOINC Code: 635-3 Convenience Name: Anerobic culture Patient Medications llergies: codeine, Motrin, penicillin A Notifications Medication Indication Start End 10/13/2019 Santyl DOSE topical 250 unit/gram ointment - ointment topical Apply nickel thick daily to the wound bed and then cover with a dressing as directed in clinic 10/20/2019 Bactrim DS DOSE 1 - oral 800 mg-160 mg tablet - 1 tablet oral taken 2 times per day for 14 days Electronic Signature(s) Signed: 10/20/2019 6:42:45 PM By: Lenda Kelp PA-C Previous Signature: 10/13/2019 7:04:14 PM Version By: Lenda Kelp PA-C Previous Signature: 10/13/2019 6:40:41 PM Version By: Adrian Deed RN, BSN Entered By: Lenda Kelp on 10/20/2019 18:42:45 -------------------------------------------------------------------------------- Problem List Details Patient Name: Date of Service: MO RA Adrian Junes. 10/13/2019 2:45 PM Medical Record Number: [id number] Patient Account Number: [id number] Date of Birth/Sex: Treating RN: 15-May-1965 (54 y.o. Adrian Schooner Primary Care Provider: Marin Comment Other Clinician: Referring Provider: Treating Provider/Extender: Adrian Pauls, Adrian [name] Weeks in Treatment: 0 Active Problems ICD-10 Encounter Code Description Active Date MDM Diagnosis L89.620 Pressure ulcer of left heel, unstageable 10/13/2019 No Yes L89.153 Pressure ulcer of sacral region, stage 3 10/13/2019 No Yes M62.81 Muscle weakness (generalized) 10/13/2019 No Yes Inactive Problems Resolved Problems Electronic Signature(s) Signed: 10/13/2019 5:07:07 PM By: Lenda Kelp PA-C Entered By: Lenda Kelp on 10/13/2019  17:07:07 -------------------------------------------------------------------------------- Progress Note Details Patient Name: Date of Service: MO RA Adrian Junes. 10/13/2019 2:45 PM Medical Record Number: [id number] Patient Account Number: [id number] Date of Birth/Sex: Treating RN: [birth year] (54 y.o. Adrian Schooner Primary Care Provider: Marin Comment Other Clinician: Referring Provider: Treating Provider/Extender: Adrian Pauls, Adrian [name] Weeks in Treatment: 0 Subjective Chief Complaint Information obtained from Patient Left heel pressure ulcer and sacral ulcer History of Present Illness (HPI) 10/13/2019 upon evaluation today patient presents for initial inspection here in our clinic concerning an issue he has been having with his heel as well as his sacral region. He has wounds of both locations which are pressure related and occurred as a result of his stroke and following inpatient stay in rehab. Unfortunately he developed these wounds at that time and has been dealing with issues since that time. He does have somewhat limited mobility has generalized weakness all over though he does have some motion of his legs it is not very coordinated he is not able to stand up on his own to legs. He does have as well what appears to be somewhat poor inhibitions as far as what he says he pretty much said whenever comes to his line. With that being said it did not appear to be directed in any 1 person in particular to be honest that he did use several words and phrases that I would not repeat here he did not seem to be out of character for what he normally would do to be perfectly honest. I did not take offense. With that being said I do think that he is good and needs some help as far as try to get these wounds to heal a big part of this is good to be talking him into staying off of his gluteal region to try to allow this to heal. He has been using Santyl on the sacral region and Neosporin on  the heel. These occurred towards the end of April/beginning of May when he is was in inpatient rehab. The stroke occurred in April that left him generalized weak at this point. His most recent hemoglobin A1c  was 5.8 and that was on the last check. Patient History Information obtained from Patient. Allergies codeine (Severity: Moderate), Motrin, penicillin Family History Hypertension - Mother,Father, Stroke - Siblings, No family history of Cancer, Diabetes, Heart Disease, Hereditary Spherocytosis, Kidney Disease, Lung Disease, Seizures, Thyroid Problems, Tuberculosis. Social History Former smoker - quit in April, Marital Status - Married, Alcohol Use - Rarely, Drug Use - No History, Caffeine Use - Daily - Soda. Medical History Cardiovascular Patient has history of Hypertension Medical A Surgical History Notes nd Cardiovascular CVA, Cardioembolic stroke Gastrointestinal Dysphagia Neurologic CVA with left hemiparesis Review of Systems (ROS) Constitutional Symptoms (General Health) Denies complaints or symptoms of Fatigue, Fever, Chills, Marked Weight Change. Eyes Denies complaints or symptoms of Dry Eyes, Vision Changes, Glasses / Contacts. Ear/Nose/Mouth/Throat Denies complaints or symptoms of Chronic sinus problems or rhinitis. Respiratory Denies complaints or symptoms of Chronic or frequent coughs, Shortness of Breath. Endocrine Denies complaints or symptoms of Heat/cold intolerance. Genitourinary Denies complaints or symptoms of Frequent urination. Integumentary (Skin) Complains or has symptoms of Wounds - wounds on sacrum, left heel. Neurologic Complains or has symptoms of Numbness/parasthesias. Psychiatric Denies complaints or symptoms of Claustrophobia, Suicidal. Objective Constitutional patient is hypertensive.. pulse regular and within target range for patient.Marland Kitchen respirations regular, non-labored and within target range for patient.Marland Kitchen temperature within target range  for patient.. Well-nourished and well-hydrated in no acute distress. Vitals Time Taken: 4:07 PM, Height: 70 in, Source: Stated, Weight: 160 lbs, Source: Stated, BMI: 23, Temperature: 98.4 F, Pulse: 102 bpm, Respiratory Rate: 16 breaths/min, Blood Pressure: 145/99 mmHg. Eyes conjunctiva clear no eyelid edema noted. pupils equal round and reactive to light and accommodation. Ears, Nose, Mouth, and Throat no gross abnormality of ear auricles or external auditory canals. normal hearing noted during conversation. mucus membranes moist. Respiratory normal breathing without difficulty. Cardiovascular 2+ dorsalis pedis/posterior tibialis pulses. no clubbing, cyanosis, significant edema, Musculoskeletal Patient unable to walk. Psychiatric this patient is able to make decisions and demonstrates good insight into disease process. Alert and Oriented x 3. pleasant and cooperative. General Notes: Upon inspection today patient appears to be doing somewhat poorly in regard to his wounds to be honest. He does appear to be very agitated but to be honest I think this is really more an issue just with his general demeanor at this point and I think in someway the stroke has affected his inhibitions as far as what he says. Nonetheless I did not take offense that any of the things he said. With that being said I do believe that the patient is going to require some debridement at some point 1 where another in regard to his heel to try to get this area to heal. I discussed this with the patient as well as his wife and son who were present during evaluation today. With that being said the patient was adamant that he does not want to come back here. Nonetheless I do believe that this is something he will need to follow-up with [place] ongoing for quite a few visits to get this completely healed. Integumentary (Hair, Skin) Wound #1 status is Open. Original cause of wound was Pressure Injury. The wound is located on the Left  Calcaneus. The wound measures 3.5cm length x 5.5cm width x 0.1cm depth; 15.119cm^2 area and 1.512cm^3 volume. There is no tunneling or undermining noted. There is a medium amount of serosanguineous drainage noted. The wound margin is flat and intact. There is small (1-33%) red granulation within the wound bed. There is a  large (67-100%) amount of necrotic tissue within the wound bed including Eschar. Wound #2 status is Open. Original cause of wound was Pressure Injury. The wound is located on the Sacrum. The wound measures 2cm length x 0.5cm width x 0.3cm depth; 0.785cm^2 area and 0.236cm^3 volume. There is Fat Layer (Subcutaneous Tissue) exposed. There is no tunneling or undermining noted. There is a medium amount of serosanguineous drainage noted. The wound margin is flat and intact. There is large (67-100%) pink granulation within the wound bed. There is a small (1-33%) amount of necrotic tissue within the wound bed including Adherent Slough. Assessment Active Problems ICD-10 Pressure ulcer of left heel, unstageable Pressure ulcer of sacral region, stage 3 Muscle weakness (generalized) Procedures Wound #1 Pre-procedure diagnosis of Wound #1 is a Pressure Ulcer located on the Left Calcaneus . There was a Chemical/Enzymatic/Mechanical debridement performed by Adrian Deed, RN.Marland Kitchen Agent used was [employer]. A time out was conducted at 17:26, prior to the start of the procedure. There was no bleeding. The procedure was tolerated well. Post Debridement Measurements: 3.5cm length x 5.5cm width x 0.1cm depth; 1.512cm^3 volume. Post debridement Stage noted as Unstageable/Unclassified. Character of Wound/Ulcer Post Debridement requires further debridement. Post procedure Diagnosis Wound #1: Same as Pre-Procedure Plan Follow-up Appointments: Return Appointment in 2 weeks. Dressing Change Frequency: Wound #1 Left Calcaneus: Change dressing every day. Wound #2 Sacrum: Change dressing every  day. Wound Cleansing: Wound #1 Left Calcaneus: May shower and wash wound with soap and water. Primary Wound Dressing: Wound #1 Left Calcaneus: Santyl Ointment - cover with saline moistened gauze Wound #2 Sacrum: Calcium Alginate with Silver Secondary Dressing: Wound #1 Left Calcaneus: Kerlix/Rolled Gauze Dry Gauze Heel Cup Wound #2 Sacrum: Foam Border - or large bandaid Off-Loading: Turn and reposition every 2 hours Laboratory ordered were: Anerobic culture - left heel The following medication(s) was prescribed: Santyl topical 250 unit/gram ointment ointment topical Apply nickel thick daily to the wound bed and then cover with a dressing as directed in clinic starting 10/13/2019 Bactrim DS oral 800 mg-160 mg tablet 1 1 tablet oral taken 2 times per day for 14 days starting 10/20/2019 1. I would recommend currently that we go ahead and continue with the wound care measures as before specifically with regard to the [facility] which I did send a refill in for the patient today as well. We will be using the Santyl however on the heel not the sacral region as the sacrum no longer requires the Santyl. 2. With regard to the sacral region I am going to suggest a silver alginate dressing which I think will be the best thing for him right now. 3. We will use a heel cup along with dry gauze and roll gauze to secure in place to try to keep this from being knocked off apparently the patient has a bad habit of rubbing the heel on the bed which causes dressings to come off very easily. 4. I did have a long discussion with the patient through some of his ranting to be honest about how he needed to spend time with pressure off of his heel and gluteal/sacral region in particular. With that being said he was very adamant that he does all he can already. Nonetheless it does not seem so in his family, wife and son, and seem to indicate as well he spends most of the time in the bed on his back. Obviously I think  this is detrimental to both wounds as far as healing is concerned. We  will see patient back for reevaluation in 2 weeks here in the clinic. If anything worsens or changes patient will contact our office for additional recommendations. Electronic Signature(s) Signed: 10/20/2019 6:42:55 PM By: Lenda Kelp PA-C Previous Signature: 10/13/2019 7:05:25 PM Version By: Lenda Kelp PA-C Entered By: Lenda Kelp on 10/20/2019 18:42:55 -------------------------------------------------------------------------------- HxROS Details Patient Name: Date of Service: MO RA Adrian Slim R. 10/13/2019 2:45 PM Medical Record Number: [id number] Patient Account Number: [id number] Date of Birth/Sex: Treating RN: [birth year] (54 y.o. Elizebeth Koller Primary Care Provider: Marin Comment Other Clinician: Referring Provider: Treating Provider/Extender: Adrian Pauls, Adrian [name] Weeks in Treatment: 0 Information Obtained From Patient Constitutional Symptoms (General Health) Complaints and Symptoms: Negative for: Fatigue; Fever; Chills; Marked Weight Change Eyes Complaints and Symptoms: Negative for: Dry Eyes; Vision Changes; Glasses / Contacts Ear/Nose/Mouth/Throat Complaints and Symptoms: Negative for: Chronic sinus problems or rhinitis Respiratory Complaints and Symptoms: Negative for: Chronic or frequent coughs; Shortness of Breath Endocrine Complaints and Symptoms: Negative for: Heat/cold intolerance Genitourinary Complaints and Symptoms: Negative for: Frequent urination Integumentary (Skin) Complaints and Symptoms: Positive for: Wounds - wounds on sacrum, left heel Neurologic Complaints and Symptoms: Positive for: Numbness/parasthesias Medical History: Past Medical History Notes: CVA with left hemiparesis Psychiatric Complaints and Symptoms: Negative for: Claustrophobia; Suicidal Hematologic/Lymphatic Cardiovascular Medical History: Positive for: Hypertension Past Medical History  Notes: CVA, Cardioembolic stroke Gastrointestinal Medical History: Past Medical History Notes: Dysphagia Immunological Oncologic Immunizations Pneumococcal Vaccine: Received Pneumococcal Vaccination: No Implantable Devices None Family and Social History Cancer: No; Diabetes: No; Heart Disease: No; Hereditary Spherocytosis: No; Hypertension: Yes - Mother,Father; Kidney Disease: No; Lung Disease: No; Seizures: No; Stroke: Yes - Siblings; Thyroid Problems: No; Tuberculosis: No; Former smoker - quit in April; Marital Status - Married; Alcohol Use: Rarely; Drug Use: No History; Caffeine Use: Daily - Soda; Financial Concerns: No; Food, Clothing or Shelter Needs: No; Support System Lacking: No; Transportation Concerns: No Psychologist, prison and probation services) Signed: 10/13/2019 7:06:00 PM By: Lenda Kelp PA-C Signed: 10/14/2019 4:47:33 PM By: Zandra Abts RN, BSN Entered By: Zandra Abts on 10/13/2019 16:28:53 -------------------------------------------------------------------------------- SuperBill Details Patient Name: Date of Service: MO RA Adrian Junes 10/13/2019 Medical Record Number: [id number] Patient Account Number: [id number] Date of Birth/Sex: Treating RN: [birth year] (54 y.o. Adrian Schooner Primary Care Provider: Marin Comment Other Clinician: Referring Provider: Treating Provider/Extender: Adrian Pauls, Adrian [name] Weeks in Treatment: 0 Diagnosis Coding ICD-10 Codes Code Description [phone] Pressure ulcer of left heel, unstageable L89.153 Pressure ulcer of sacral region, stage 3 M62.81 Muscle weakness (generalized) Facility Procedures CPT4 Code: [id number] Description: 99213 - WOUND CARE VISIT-LEV 3 EST PT Modifier: 25 Quantity: 1 CPT4 Code: [id number] Description: 24401 - DEBRIDE W/O ANES NON SELECT Modifier: Quantity: 1 Physician Procedures : CPT4 Code Description Modifier [id number] 99204 - WC PHYS LEVEL 4 - NEW PT ICD-10 Diagnosis Description L89.620 Pressure ulcer of  left heel, unstageable L89.153 Pressure ulcer of sacral region, stage 3 M62.81 Muscle weakness (generalized) Quantity: 1 Electronic Signature(s) Signed: 10/13/2019 7:05:43 PM By: Lenda Kelp PA-C Previous Signature: 10/13/2019 6:40:41 PM Version By: Adrian Deed RN, BSN Entered By: Lenda Kelp on 10/13/2019 19:05:43

## 2019-10-17 LAB — AEROBIC CULTURE W GRAM STAIN (SUPERFICIAL SPECIMEN)

## 2019-10-27 ENCOUNTER — Encounter (HOSPITAL_BASED_OUTPATIENT_CLINIC_OR_DEPARTMENT_OTHER): Payer: Medicaid Other | Attending: Physician Assistant | Admitting: Physician Assistant

## 2019-11-01 ENCOUNTER — Other Ambulatory Visit: Payer: Self-pay | Admitting: Physical Medicine & Rehabilitation

## 2019-11-23 ENCOUNTER — Encounter (HOSPITAL_BASED_OUTPATIENT_CLINIC_OR_DEPARTMENT_OTHER): Payer: Medicaid Other | Admitting: Internal Medicine

## 2019-11-29 ENCOUNTER — Ambulatory Visit: Payer: Self-pay | Admitting: Psychology

## 2019-12-06 ENCOUNTER — Other Ambulatory Visit: Payer: Self-pay | Admitting: Physical Medicine & Rehabilitation

## 2020-11-22 ENCOUNTER — Institutional Professional Consult (permissible substitution): Payer: Medicaid Other | Admitting: Neurology

## 2020-11-22 ENCOUNTER — Encounter: Payer: Self-pay | Admitting: Neurology
